# Patient Record
Sex: Female | Born: 1940
Health system: Southern US, Community
[De-identification: ages and names within clinical notes are randomized; demographics above are authoritative.]

## PROBLEM LIST (undated history)

## (undated) ENCOUNTER — Emergency Department (HOSPITAL_COMMUNITY): Admission: EM | Discharge status: Against Medical Advice | Payer: Medicare Other

## (undated) DIAGNOSIS — Z5189 Encounter for other specified aftercare: Secondary | ICD-10-CM

## (undated) DIAGNOSIS — D649 Anemia, unspecified: Secondary | ICD-10-CM

## (undated) DIAGNOSIS — E039 Hypothyroidism, unspecified: Secondary | ICD-10-CM

## (undated) DIAGNOSIS — F419 Anxiety disorder, unspecified: Secondary | ICD-10-CM

## (undated) DIAGNOSIS — IMO0001 Reserved for inherently not codable concepts without codable children: Secondary | ICD-10-CM

## (undated) DIAGNOSIS — M1990 Unspecified osteoarthritis, unspecified site: Secondary | ICD-10-CM

## (undated) DIAGNOSIS — E785 Hyperlipidemia, unspecified: Secondary | ICD-10-CM

## (undated) DIAGNOSIS — C801 Malignant (primary) neoplasm, unspecified: Secondary | ICD-10-CM

## (undated) DIAGNOSIS — I10 Essential (primary) hypertension: Secondary | ICD-10-CM

## (undated) HISTORY — PX: TUBAL LIGATION: SHX77

## (undated) HISTORY — DX: Unspecified osteoarthritis, unspecified site: M19.90

## (undated) HISTORY — DX: Encounter for other specified aftercare: Z51.89

## (undated) HISTORY — PX: OTHER SURGICAL HISTORY: SHX169

## (undated) HISTORY — DX: Hyperlipidemia, unspecified: E78.5

## (undated) HISTORY — DX: Reserved for inherently not codable concepts without codable children: IMO0001

---

## 2007-07-01 ENCOUNTER — Other Ambulatory Visit: Admission: RE | Admit: 2007-07-01 | Discharge: 2007-07-01 | Payer: Self-pay | Admitting: Obstetrics and Gynecology

## 2007-08-22 ENCOUNTER — Emergency Department (HOSPITAL_COMMUNITY): Admission: EM | Admit: 2007-08-22 | Discharge: 2007-08-22 | Payer: Self-pay | Admitting: Family Medicine

## 2007-09-09 ENCOUNTER — Encounter: Admission: RE | Admit: 2007-09-09 | Discharge: 2007-09-09 | Payer: Self-pay | Admitting: Obstetrics and Gynecology

## 2008-09-09 ENCOUNTER — Encounter: Admission: RE | Admit: 2008-09-09 | Discharge: 2008-09-09 | Payer: Self-pay | Admitting: Obstetrics and Gynecology

## 2009-09-20 ENCOUNTER — Other Ambulatory Visit: Admission: RE | Admit: 2009-09-20 | Discharge: 2009-09-20 | Payer: Self-pay | Admitting: Obstetrics and Gynecology

## 2009-10-12 ENCOUNTER — Encounter: Admission: RE | Admit: 2009-10-12 | Discharge: 2009-10-12 | Payer: Self-pay | Admitting: Obstetrics and Gynecology

## 2010-10-04 ENCOUNTER — Other Ambulatory Visit: Payer: Self-pay | Admitting: Obstetrics and Gynecology

## 2010-10-04 DIAGNOSIS — Z1231 Encounter for screening mammogram for malignant neoplasm of breast: Secondary | ICD-10-CM

## 2010-10-14 ENCOUNTER — Ambulatory Visit
Admission: RE | Admit: 2010-10-14 | Discharge: 2010-10-14 | Disposition: A | Discharge status: Home / Self Care | Payer: Medicare Other | Source: Ambulatory Visit | Attending: Obstetrics and Gynecology | Admitting: Obstetrics and Gynecology

## 2010-10-14 DIAGNOSIS — Z1231 Encounter for screening mammogram for malignant neoplasm of breast: Secondary | ICD-10-CM

## 2011-03-13 LAB — POCT I-STAT, CHEM 8
Creatinine, Ser: 1.1
Glucose, Bld: 128 — ABNORMAL HIGH
Hemoglobin: 14.6
TCO2: 32

## 2011-06-20 HISTORY — PX: COLON SURGERY: SHX602

## 2011-09-20 ENCOUNTER — Other Ambulatory Visit: Payer: Self-pay | Admitting: Obstetrics and Gynecology

## 2011-09-20 DIAGNOSIS — Z1231 Encounter for screening mammogram for malignant neoplasm of breast: Secondary | ICD-10-CM

## 2011-09-28 DIAGNOSIS — E119 Type 2 diabetes mellitus without complications: Secondary | ICD-10-CM | POA: Diagnosis not present

## 2011-09-28 DIAGNOSIS — H52229 Regular astigmatism, unspecified eye: Secondary | ICD-10-CM | POA: Diagnosis not present

## 2011-09-28 DIAGNOSIS — H524 Presbyopia: Secondary | ICD-10-CM | POA: Diagnosis not present

## 2011-09-28 DIAGNOSIS — H5200 Hypermetropia, unspecified eye: Secondary | ICD-10-CM | POA: Diagnosis not present

## 2011-10-17 ENCOUNTER — Ambulatory Visit
Admission: RE | Admit: 2011-10-17 | Discharge: 2011-10-17 | Disposition: A | Discharge status: Home / Self Care | Payer: Medicare Other | Source: Ambulatory Visit | Attending: Obstetrics and Gynecology | Admitting: Obstetrics and Gynecology

## 2011-10-17 DIAGNOSIS — Z1231 Encounter for screening mammogram for malignant neoplasm of breast: Secondary | ICD-10-CM | POA: Diagnosis not present

## 2012-01-05 ENCOUNTER — Other Ambulatory Visit (HOSPITAL_COMMUNITY)
Admission: RE | Admit: 2012-01-05 | Discharge: 2012-01-05 | Disposition: A | Discharge status: Home / Self Care | Payer: Medicare Other | Source: Ambulatory Visit | Attending: Obstetrics and Gynecology | Admitting: Obstetrics and Gynecology

## 2012-01-05 DIAGNOSIS — N83209 Unspecified ovarian cyst, unspecified side: Secondary | ICD-10-CM | POA: Diagnosis not present

## 2012-01-05 DIAGNOSIS — Z124 Encounter for screening for malignant neoplasm of cervix: Secondary | ICD-10-CM | POA: Insufficient documentation

## 2012-01-05 DIAGNOSIS — Z01419 Encounter for gynecological examination (general) (routine) without abnormal findings: Secondary | ICD-10-CM | POA: Diagnosis not present

## 2012-01-23 DIAGNOSIS — N83209 Unspecified ovarian cyst, unspecified side: Secondary | ICD-10-CM | POA: Diagnosis not present

## 2012-02-27 DIAGNOSIS — E7800 Pure hypercholesterolemia, unspecified: Secondary | ICD-10-CM | POA: Diagnosis not present

## 2012-02-27 DIAGNOSIS — E119 Type 2 diabetes mellitus without complications: Secondary | ICD-10-CM | POA: Diagnosis not present

## 2012-02-27 DIAGNOSIS — R42 Dizziness and giddiness: Secondary | ICD-10-CM | POA: Diagnosis not present

## 2012-02-27 DIAGNOSIS — R5383 Other fatigue: Secondary | ICD-10-CM | POA: Diagnosis not present

## 2012-02-27 DIAGNOSIS — E039 Hypothyroidism, unspecified: Secondary | ICD-10-CM | POA: Diagnosis not present

## 2012-02-27 DIAGNOSIS — I10 Essential (primary) hypertension: Secondary | ICD-10-CM | POA: Diagnosis not present

## 2012-02-27 DIAGNOSIS — J309 Allergic rhinitis, unspecified: Secondary | ICD-10-CM | POA: Diagnosis not present

## 2012-02-27 DIAGNOSIS — E538 Deficiency of other specified B group vitamins: Secondary | ICD-10-CM | POA: Diagnosis not present

## 2012-02-27 DIAGNOSIS — Z23 Encounter for immunization: Secondary | ICD-10-CM | POA: Diagnosis not present

## 2012-03-01 DIAGNOSIS — D649 Anemia, unspecified: Secondary | ICD-10-CM | POA: Diagnosis not present

## 2012-03-04 ENCOUNTER — Other Ambulatory Visit: Payer: Self-pay | Admitting: Gastroenterology

## 2012-03-04 DIAGNOSIS — D509 Iron deficiency anemia, unspecified: Secondary | ICD-10-CM | POA: Diagnosis not present

## 2012-03-04 DIAGNOSIS — D649 Anemia, unspecified: Secondary | ICD-10-CM | POA: Diagnosis not present

## 2012-03-04 DIAGNOSIS — C189 Malignant neoplasm of colon, unspecified: Secondary | ICD-10-CM

## 2012-03-04 DIAGNOSIS — K319 Disease of stomach and duodenum, unspecified: Secondary | ICD-10-CM | POA: Diagnosis not present

## 2012-03-04 DIAGNOSIS — K259 Gastric ulcer, unspecified as acute or chronic, without hemorrhage or perforation: Secondary | ICD-10-CM | POA: Diagnosis not present

## 2012-03-04 DIAGNOSIS — C184 Malignant neoplasm of transverse colon: Secondary | ICD-10-CM | POA: Diagnosis not present

## 2012-03-04 DIAGNOSIS — D131 Benign neoplasm of stomach: Secondary | ICD-10-CM | POA: Diagnosis not present

## 2012-03-05 ENCOUNTER — Ambulatory Visit
Admission: RE | Admit: 2012-03-05 | Discharge: 2012-03-05 | Disposition: A | Discharge status: Home / Self Care | Payer: Medicare Other | Source: Ambulatory Visit | Attending: Gastroenterology | Admitting: Gastroenterology

## 2012-03-05 ENCOUNTER — Encounter (HOSPITAL_COMMUNITY): Payer: Self-pay | Admitting: *Deleted

## 2012-03-05 DIAGNOSIS — E669 Obesity, unspecified: Secondary | ICD-10-CM | POA: Insufficient documentation

## 2012-03-05 DIAGNOSIS — D62 Acute posthemorrhagic anemia: Principal | ICD-10-CM | POA: Insufficient documentation

## 2012-03-05 DIAGNOSIS — C182 Malignant neoplasm of ascending colon: Secondary | ICD-10-CM | POA: Diagnosis not present

## 2012-03-05 DIAGNOSIS — E119 Type 2 diabetes mellitus without complications: Secondary | ICD-10-CM | POA: Insufficient documentation

## 2012-03-05 DIAGNOSIS — C189 Malignant neoplasm of colon, unspecified: Secondary | ICD-10-CM

## 2012-03-05 DIAGNOSIS — D500 Iron deficiency anemia secondary to blood loss (chronic): Secondary | ICD-10-CM | POA: Diagnosis not present

## 2012-03-05 DIAGNOSIS — I10 Essential (primary) hypertension: Secondary | ICD-10-CM | POA: Insufficient documentation

## 2012-03-05 DIAGNOSIS — K432 Incisional hernia without obstruction or gangrene: Secondary | ICD-10-CM | POA: Insufficient documentation

## 2012-03-05 DIAGNOSIS — D649 Anemia, unspecified: Secondary | ICD-10-CM | POA: Diagnosis not present

## 2012-03-05 LAB — COMPREHENSIVE METABOLIC PANEL
ALT: 11 U/L (ref 0–35)
AST: 17 U/L (ref 0–37)
Creatinine, Ser: 0.65 mg/dL (ref 0.50–1.10)
GFR calc Af Amer: >90 mL/min (ref >90)
Glucose, Bld: 190 mg/dL — ABNORMAL HIGH (ref 70–99)
Potassium: 3.6 mEq/L (ref 3.5–5.1)
Sodium: 132 mEq/L — ABNORMAL LOW (ref 135–145)
Total Bilirubin: 0.3 mg/dL (ref 0.3–1.2)

## 2012-03-05 LAB — CBC WITH DIFFERENTIAL/PLATELET
Basophils Absolute: 0.1 10*3/uL (ref 0.0–0.1)
Hemoglobin: 6.4 g/dL — CL (ref 12.0–15.0)
Lymphs Abs: 2.7 10*3/uL (ref 0.7–4.0)
MCV: 63.4 fL — ABNORMAL LOW (ref 78.0–100.0)
Monocytes Absolute: 0.7 10*3/uL (ref 0.1–1.0)
Neutrophils Relative %: 53 % (ref 43–77)
RBC: 3.72 MIL/uL — ABNORMAL LOW (ref 3.87–5.11)
WBC: 7.4 10*3/uL (ref 4.0–10.5)

## 2012-03-05 MED ORDER — IOHEXOL 300 MG/ML  SOLN
125.0000 mL | Freq: Once | INTRAMUSCULAR | Status: AC | PRN
  Administered 2012-03-05: 125 mL via INTRAVENOUS

## 2012-03-05 NOTE — ED Notes (Addendum)
Just spoke with Dr Madilyn Fireman nurse and reports that pts hemoglobin was 5.8 in the office yesterday, asked office to fax pts records to place in chart. Nurse reports that they tried to get pt and outpatient appt for blood transfusion but appt was not available till Thursday and pt was sent here for possible admission, requests that pt have surgery consult.

## 2012-03-05 NOTE — ED Notes (Signed)
Pt reports approx 3 month hx of weakness. States that she is followed closely by PCP and reports that they have noted H&H to be low. Pt had colonoscopy and endoscopy done yesterday along with a CT today to rule out reason for H&H. States that she thinks that hemoglogin went from 6 to 4. Pt reports mild sob when going up stairs. Pt states she appt with surgeon on Friday to speak about what was found on colon, pt states she was unsure what it was.

## 2012-03-06 ENCOUNTER — Observation Stay (HOSPITAL_COMMUNITY)
Admission: EM | Admit: 2012-03-06 | Discharge: 2012-03-06 | Disposition: A | Discharge status: Home / Self Care | Payer: Medicare Other | Attending: Internal Medicine | Admitting: Internal Medicine

## 2012-03-06 DIAGNOSIS — C189 Malignant neoplasm of colon, unspecified: Secondary | ICD-10-CM | POA: Diagnosis not present

## 2012-03-06 DIAGNOSIS — D649 Anemia, unspecified: Secondary | ICD-10-CM | POA: Diagnosis not present

## 2012-03-06 DIAGNOSIS — K6389 Other specified diseases of intestine: Secondary | ICD-10-CM

## 2012-03-06 DIAGNOSIS — E119 Type 2 diabetes mellitus without complications: Secondary | ICD-10-CM

## 2012-03-06 DIAGNOSIS — I10 Essential (primary) hypertension: Secondary | ICD-10-CM | POA: Diagnosis not present

## 2012-03-06 HISTORY — DX: Essential (primary) hypertension: I10

## 2012-03-06 LAB — CBC
HCT: 32 % — ABNORMAL LOW (ref 36.0–46.0)
Hemoglobin: 9.7 g/dL — ABNORMAL LOW (ref 12.0–15.0)
RDW: 21.7 % — ABNORMAL HIGH (ref 11.5–15.5)
WBC: 8.2 10*3/uL (ref 4.0–10.5)

## 2012-03-06 LAB — GLUCOSE, CAPILLARY
Glucose-Capillary: 118 mg/dL — ABNORMAL HIGH (ref 70–99)
Glucose-Capillary: 158 mg/dL — ABNORMAL HIGH (ref 70–99)

## 2012-03-06 LAB — CEA: CEA: 1 ng/mL (ref 0.0–5.0)

## 2012-03-06 LAB — OCCULT BLOOD, POC DEVICE: Fecal Occult Bld: NEGATIVE

## 2012-03-06 LAB — FOLATE: Folate: >20 ng/mL

## 2012-03-06 LAB — VITAMIN B12: Vitamin B-12: 596 pg/mL (ref 211–911)

## 2012-03-06 LAB — IRON AND TIBC: Iron: <10 ug/dL — ABNORMAL LOW (ref 42–135)

## 2012-03-06 LAB — PREPARE RBC (CROSSMATCH)

## 2012-03-06 LAB — RETICULOCYTES: Retic Ct Pct: 2 % (ref 0.4–3.1)

## 2012-03-06 MED ORDER — LEVOTHYROXINE SODIUM 137 MCG PO TABS
137000.0000 ug | ORAL_TABLET | Freq: Every day | ORAL | Status: DC
  Filled 2012-03-06 (×2): qty 1000

## 2012-03-06 MED ORDER — SIMVASTATIN 20 MG PO TABS
20.0000 mg | ORAL_TABLET | Freq: Every day | ORAL | Status: DC
  Administered 2012-03-06: 20 mg via ORAL
  Filled 2012-03-06: qty 1

## 2012-03-06 MED ORDER — ENALAPRIL MALEATE 10 MG PO TABS
10.0000 mg | ORAL_TABLET | Freq: Every day | ORAL | Status: DC
  Administered 2012-03-06: 10 mg via ORAL
  Filled 2012-03-06: qty 1

## 2012-03-06 MED ORDER — ACETAMINOPHEN 325 MG PO TABS
650.0000 mg | ORAL_TABLET | Freq: Once | ORAL | Status: AC
  Administered 2012-03-06: 650 mg via ORAL
  Filled 2012-03-06: qty 2

## 2012-03-06 MED ORDER — GLIPIZIDE ER 10 MG PO TB24
10.0000 mg | ORAL_TABLET | Freq: Every day | ORAL | Status: DC
  Administered 2012-03-06: 10 mg via ORAL
  Filled 2012-03-06: qty 1

## 2012-03-06 MED ORDER — LEVOTHYROXINE SODIUM 137 MCG PO TABS
137.0000 ug | ORAL_TABLET | ORAL | Status: DC
  Filled 2012-03-06: qty 1

## 2012-03-06 MED ORDER — HYDROCHLOROTHIAZIDE 25 MG PO TABS
25.0000 mg | ORAL_TABLET | Freq: Every day | ORAL | Status: DC
  Administered 2012-03-06: 25 mg via ORAL
  Filled 2012-03-06: qty 1

## 2012-03-06 MED ORDER — AMLODIPINE BESYLATE 10 MG PO TABS
10.0000 mg | ORAL_TABLET | Freq: Every day | ORAL | Status: DC
  Administered 2012-03-06: 10 mg via ORAL
  Filled 2012-03-06: qty 1

## 2012-03-06 MED ORDER — SODIUM CHLORIDE 0.9 % IJ SOLN
3.0000 mL | Freq: Two times a day (BID) | INTRAMUSCULAR | Status: DC
  Administered 2012-03-06: 3 mL via INTRAVENOUS

## 2012-03-06 MED ORDER — HYDROCHLOROTHIAZIDE 25 MG PO TABS
25.0000 mg | ORAL_TABLET | ORAL | Status: DC
  Filled 2012-03-06: qty 1

## 2012-03-06 MED ORDER — ENALAPRIL MALEATE 10 MG PO TABS
10.0000 mg | ORAL_TABLET | ORAL | Status: DC
  Filled 2012-03-06: qty 1

## 2012-03-06 MED ORDER — LEVOTHYROXINE SODIUM 137 MCG PO TABS
137.0000 ug | ORAL_TABLET | Freq: Every day | ORAL | Status: DC
  Administered 2012-03-06: 137 ug via ORAL
  Filled 2012-03-06 (×2): qty 1

## 2012-03-06 MED ORDER — ENALAPRIL-HYDROCHLOROTHIAZIDE 10-25 MG PO TABS: 1.0000 | ORAL_TABLET | Freq: Every day | ORAL | Status: DC

## 2012-03-06 MED ORDER — DIPHENHYDRAMINE HCL 50 MG/ML IJ SOLN
50.0000 mg | Freq: Once | INTRAMUSCULAR | Status: AC
  Administered 2012-03-06: 50 mg via INTRAVENOUS
  Filled 2012-03-06 (×2): qty 1

## 2012-03-06 MED ORDER — AMLODIPINE BESYLATE 10 MG PO TABS
10.0000 mg | ORAL_TABLET | ORAL | Status: DC
  Filled 2012-03-06: qty 1

## 2012-03-06 MED ORDER — SIMVASTATIN 20 MG PO TABS
20.0000 mg | ORAL_TABLET | ORAL | Status: DC
  Filled 2012-03-06: qty 1

## 2012-03-06 MED ORDER — GLIPIZIDE ER 10 MG PO TB24
10.0000 mg | ORAL_TABLET | ORAL | Status: DC
  Filled 2012-03-06: qty 1

## 2012-03-06 NOTE — ED Notes (Signed)
Patient presents with c/o feeling weak and tired for the past 3 months.  Skin pale, mucous membranes moist

## 2012-03-06 NOTE — Discharge Summary (Signed)
Physician Discharge Summary  Judith Stephens JXB:[id number] DOB: Nov 15, 1940 DOA: 03/06/2012  PCP: Benita Stabile, MD  Admit date: 03/06/2012 Discharge date: 03/06/2012  Recommendations for Outpatient Follow-up:  1. Pt will need to follow up with PCP in 2-3 weeks post discharge 2. Please also check CBC to evaluate Hg and Hct levels 3. Please note that pt was transfused total of 3 units PRBC 4. Pt will see Dr. Derrell Lolling 03/08/2012 for further evaluation   Discharge Diagnoses: Acute blood loss anemia likely secondary to newly diagnosed carcinoma of the colon.  Principal Problem:  *Anemia Active Problems:  Colon cancer  Diabetes mellitus  HTN (hypertension)   Discharge Condition: Stable  Diet recommendation: Heart healthy diet discussed in details   History of present illness:  Judith Stephens is a 71 y.o. female who presented to the ED today at the request of her GI doctor (Dr. Madilyn Fireman) for a blood transfusion due to chronic blood loss anemia. The patients chronic blood loss anemia is occuring in the context of a recently identified carcinoma of the colon (seen on colonoscopy, biopsy, and CT scan). The patient is also scheduled to have an initial appointment with surgery. The anemia is chronic in duration. Nothing seems to alleviate the symptoms of fatigue associated with her anemia.  Hospital Course:  Principal Problem:  *Anemia - possibly related to newly diagnosed carcinoma - pt was transfused 3 units of PRBC and Hg at the time of the discharge pending - will call pt in AM with the final result and will also forward to her GI specialist - further evaluation per GI and surgery  Active Problems:  Colon cancer - this will have to be coordinated with PCP and appropriate referrals made a    Diabetes mellitus - continue home medication regimen   HTN (hypertension) - continue home medication regimen  Procedures/Studies: Ct Abdomen Pelvis W Contrast 03/05/2012    IMPRESSION: .  1.   Constricting lesion in the ascending colon just proximal to the the hepatic flexure consistent with the patient's known colon carcinoma.   2.  Minimal strandiness of the adjacent soft tissues is noted, but no adenopathy is seen.  No evidence of hepatic metastasis.  No adenopathy.  3.  1.7 cm indeterminate right renal lesion.  Cannot exclude renal cell carcinoma.  Consider MRI to assess further.   4. Two gallstones, the larger of 2.1 cm.  5.  6 mm nonobstructing right lower pole renal calculus.  6.  Probable left ovarian cyst.  Consider pelvic ultrasound if further assessment if warranted.  7.  Periumbilical hernia containing a nondilated loop of small bowel.     Consultations:  Surgery - Dr. Derrell Lolling  Antibiotics:  none  Discharge Exam: Filed Vitals:   03/06/12 1447  BP: 135/81  Pulse: 72  Temp: 98.5 F (36.9 C)  Resp: 16   Filed Vitals:   03/06/12 1225 03/06/12 1325 03/06/12 1415 03/06/12 1447  BP: 150/77 145/81 151/82 135/81  Pulse: 77 73 75 72  Temp: 98.4 F (36.9 C) 98.9 F (37.2 C) 98.7 F (37.1 C) 98.5 F (36.9 C)  TempSrc: Oral Oral Oral Oral  Resp: 16   16  SpO2:        General: Pt is alert, follows commands appropriately, not in acute distress Cardiovascular: Regular rate and rhythm, S1/S2 +, no murmurs, no rubs, no gallops Respiratory: Clear to auscultation bilaterally, no wheezing, no crackles, no rhonchi Abdominal: Soft, non tender, non distended, bowel sounds +, no guarding Extremities: no edema,  no cyanosis, pulses palpable bilaterally DP and PT Neuro: Grossly nonfocal  Discharge Instructions  Discharge Orders    Future Appointments: Provider: Department: Dept Phone: Center:   03/08/2012 11:00 AM Ernestene Mention, MD [facility] [phone] None     Future Orders Please Complete By Expires   Diet - low sodium heart healthy      Increase activity slowly          Medication List     As of 03/06/2012  3:06 PM    TAKE these medications           amLODipine 10 MG tablet   Commonly known as: NORVASC   Take 10 mg by mouth daily.      enalapril-hydrochlorothiazide 10-25 MG per tablet   Commonly known as: VASERETIC   Take 1 tablet by mouth daily.      GLIPIZIDE XL 10 MG 24 hr tablet   Generic drug: glipiZIDE   Take 10 mg by mouth daily.      levothyroxine 137 MCG tablet   Commonly known as: SYNTHROID, LEVOTHROID   Take 137 mg by mouth daily.      pravastatin 40 MG tablet   Commonly known as: PRAVACHOL   Take 40 mg by mouth daily.           Follow-up Information    Follow up with Ernestene Mention, MD. On 03/08/2012.   Contact information:   [address] [place] [address] [phone]       Follow up with Benita Stabile, MD. In 2 weeks.   Contact information:   [facility] [address] [place] [address] [phone]           The results of significant diagnostics from this hospitalization (including imaging, microbiology, ancillary and laboratory) are listed below for reference.     Microbiology: No results found for this or any previous visit (from the past 240 hour(s)).   Labs: Basic Metabolic Panel:  Lab 03/05/12 3295  NA 132*  K 3.6  CL 97  CO2 24  GLUCOSE 190*  BUN 10  CREATININE 0.65  CALCIUM 10.3  MG --  PHOS --  Liver Function Tests:  Lab 03/05/12 1725  AST 17  ALT 11  ALKPHOS 56  BILITOT 0.3  PROT 8.1  ALBUMIN 3.8   CBC:  Lab 03/05/12 1725  WBC 7.4  NEUTROABS 3.9  HGB 6.4*  HCT 23.6*  MCV 63.4*  PLT 317   CBG:  Lab 03/06/12 1141 03/06/12 0854 03/06/12 0223  GLUCAP 182* 126* 118*     SIGNED: Time coordinating discharge: Over 30 minutes  Debbora Presto, MD  [facility] 03/06/2012, 3:06 PM Pager [phone]  If 7PM-7AM, please contact night-coverage [email or web address] Password [email or web address]

## 2012-03-06 NOTE — Progress Notes (Signed)
Utilization review completed.  

## 2012-03-06 NOTE — ED Provider Notes (Signed)
History     CSN: [id number]  Arrival date & time 03/05/12  1705   First MD Initiated Contact with Patient 03/06/12 0019      Chief Complaint  Patient presents with  . Anemia    (Consider location/radiation/quality/duration/timing/severity/associated sxs/prior treatment) HPI Comments: Pt presents for evaluation of anemia at the request of her physician.  She had a colonoscopy and endoscopy performed yesterday and was noted to have a low hemoglobin (5.90.  She reports having some fatigue and having a near syncopal event while at the hairdresser about a week ago.  She states that she feels winded after walking a small flight of stairs.  She denies any chest pain, palpitations, GI bleeding, melena, hematemesis, vaginal bleeding.  She denies any hx of CAD.  Patient is a 71 y.o. female presenting with anemia. The history is provided by the patient. No language interpreter was used.  Anemia This is a new problem. The current episode started more than 1 week ago (unk). The problem has not changed since onset.Associated symptoms include shortness of breath. Pertinent negatives include no chest pain, no abdominal pain and no headaches. The symptoms are aggravated by walking.    Past Medical History  Diagnosis Date  . Hypertension   . Diabetes mellitus     History reviewed. No pertinent past surgical history.  History reviewed. No pertinent family history.  History  Substance Use Topics  . Smoking status: Never Smoker   . Smokeless tobacco: Not on file  . Alcohol Use: No    OB History    Grav Para Term Preterm Abortions TAB SAB Ect Mult Living                  Review of Systems  Constitutional: Positive for fatigue. Negative for fever, chills, diaphoresis, activity change and appetite change.  HENT: Negative for congestion, sore throat, facial swelling, rhinorrhea, neck pain and neck stiffness.   Eyes: Negative for visual disturbance.  Respiratory: Positive for shortness of  breath. Negative for cough, choking, chest tightness and wheezing.   Cardiovascular: Negative for chest pain, palpitations and leg swelling.  Gastrointestinal: Negative for nausea, vomiting, abdominal pain, diarrhea, constipation, blood in stool and anal bleeding.  Genitourinary: Negative for dysuria, urgency, hematuria, vaginal bleeding, difficulty urinating and dyspareunia.  Musculoskeletal: Negative for back pain and joint swelling.  Skin: Negative for color change, pallor and wound.  Neurological: Positive for light-headedness. Negative for dizziness, syncope, speech difficulty, weakness and headaches.  Hematological: Does not bruise/bleed easily.  Psychiatric/Behavioral: Negative.     Allergies  Review of patient's allergies indicates no known allergies.  Home Medications   Current Outpatient Rx  Name Route Sig Dispense Refill  . AMLODIPINE BESYLATE 10 MG PO TABS Oral Take 10 mg by mouth daily.    . ENALAPRIL-HYDROCHLOROTHIAZIDE 10-25 MG PO TABS Oral Take 1 tablet by mouth daily.    Marland Kitchen GLIPIZIDE XL 10 MG PO TB24 Oral Take 10 mg by mouth daily.    Marland Kitchen LEVOTHYROXINE SODIUM 137 MCG PO TABS Oral Take 137 mg by mouth daily.    Marland Kitchen PRAVASTATIN SODIUM 40 MG PO TABS Oral Take 40 mg by mouth daily.      BP 174/87  Pulse 81  Temp 98.2 F (36.8 C) (Oral)  Resp 16  SpO2 100%  Physical Exam  Nursing note and vitals reviewed. Constitutional: She is oriented to person, place, and time. She appears well-developed and well-nourished. No distress.  HENT:  Head: Normocephalic and atraumatic.  Right  Ear: External ear normal.  Left Ear: External ear normal.  Nose: Nose normal.  Mouth/Throat: Oropharynx is clear and moist. No oropharyngeal exudate.  Eyes: Conjunctivae normal and EOM are normal. Pupils are equal, round, and reactive to light. Right eye exhibits no discharge. Left eye exhibits no discharge. No scleral icterus.  Neck: Normal range of motion. Neck supple. No JVD present. No tracheal  deviation present. No thyromegaly present.  Cardiovascular: Normal rate, regular rhythm, normal heart sounds and intact distal pulses.  Exam reveals no gallop and no friction rub.   No murmur heard. Pulmonary/Chest: Effort normal and breath sounds normal. No stridor. No respiratory distress. She has no wheezes. She has no rales. She exhibits no tenderness.  Abdominal: Soft. Bowel sounds are normal. She exhibits no distension and no mass. There is no tenderness. There is no rebound and no guarding.  Genitourinary: Rectal exam shows external hemorrhoid and internal hemorrhoid. Rectal exam shows no fissure, no mass, no tenderness and anal tone normal. Guaiac negative stool.  Musculoskeletal: Normal range of motion. She exhibits no edema and no tenderness.  Lymphadenopathy:    She has no cervical adenopathy.  Neurological: She is alert and oriented to person, place, and time. No cranial nerve deficit.  Skin: Skin is warm and dry. No rash noted. She is not diaphoretic. No erythema. No pallor.  Psychiatric: She has a normal mood and affect. Her behavior is normal.    ED Course  Procedures (including critical care time)  Labs Reviewed  COMPREHENSIVE METABOLIC PANEL - Abnormal; Notable for the following:    Sodium 132 (*)     Glucose, Bld 190 (*)     GFR calc non Af Amer 88 (*)     All other components within normal limits  CBC WITH DIFFERENTIAL - Abnormal; Notable for the following:    RBC 3.72 (*)     Hemoglobin 6.4 (*)     HCT 23.6 (*)     MCV 63.4 (*)     MCH 17.2 (*)     MCHC 27.1 (*)     RDW 18.4 (*)     All other components within normal limits  TYPE AND SCREEN  ABO/RH   Ct Abdomen Pelvis W Contrast  03/05/2012  *RADIOLOGY REPORT*  Clinical Data: New diagnosis of colon carcinoma, anemia, fatigue  CT ABDOMEN AND PELVIS WITH CONTRAST  Technique:  Multidetector CT imaging of the abdomen and pelvis was performed following the standard protocol during bolus administration of intravenous  contrast.  Contrast: OMNIPAQUE IOHEXOL 300 MG/ML  SOLN  Comparison: None.  Findings: The lung bases are clear.  The liver enhances and there is no evidence of metastatic involvement of the liver.  No ductal dilatation is seen.  There do appear to be two gallstones within the contracted gallbladder with the larger measuring 2.1 cm.  The pancreas is normal in size and the pancreatic duct is not dilated. The adrenal glands and spleen are unremarkable.  The stomach is moderately fluid distended with no abnormality noted.  The kidneys enhance and there is a cyst emanating from upper pole of the right kidney measuring 3.6 cm.  A cyst emanates from the lower pole left kidney as well.  There is an indeterminate lesion emanating from the right mid lower kidney posterolaterally with attenuation of 34 HU measuring 1.8 cm.  Although this may represent a complex cyst, a small renal cell carcinoma cannot be excluded.  A 6 mm nonobstructing right lower pole renal calculus  is present.  The abdominal aorta is normal in caliber.  The mesenteric vasculature appears patent.  No adenopathy is seen.  The urinary bladder is not well distended.  There are low attenuation structures in the left adnexa with adjacent calcification probably representing left ovarian cyst possibly septated.  If further assessment is warranted ultrasound would be recommended.  There is a constricting lesion within the ascending colon just below the hepatic flexure consistent with the patient's recently diagnosed colon carcinoma.  Some strandiness of the adjacent soft tissues is seen but no adenopathy is noted.  There is a periumbilical hernia containing a nondilated loop of small bowel. No pelvic adenopathy is seen.  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 1.  Constricting lesion in the ascending colon just proximal to the the hepatic flexure consistent with the patient's known colon carcinoma.  Minimal strandiness of the  adjacent soft tissues is noted, but no adenopathy is seen.  No evidence of hepatic metastasis.  No adenopathy. 3.  1.7 cm indeterminate right renal lesion.  Cannot exclude renal cell carcinoma.  Consider MRI to assess further.  4. Two gallstones, the larger of 2.1 cm. 5.  6 mm nonobstructing right lower pole renal calculus. 6.  Probable left ovarian cyst.  Consider pelvic ultrasound if further assessment if warranted. 7.  Periumbilical hernia containing a nondilated loop of small bowel.   Original Report Authenticated By: Juline Patch, M.D.      No diagnosis found.   Date: 03/06/2012  Rate: 80 bpm  Rhythm: sinus  QRS Axis: left  Intervals: normal  ST/T Wave abnormalities: normal  Conduction Disutrbances:none  Narrative Interpretation: unremarkable study  Old EKG Reviewed: none available      MDM  Pt presents at the request of her gastroenterologist after having an endoscopy performed yesterday.  She was noted to have a low hemoglobin (5.8).  She reports some mild exertional dyspnea but no pain.  She denies any known source of bleeding.  H&H tonight is 6.4/23.6 with a nl platelet count.  Will obtain orthostatic VS and check stool for occult blood.  She has no hx of CAD.  Anticipate initiating an anemia work-up + admission for transfusion.  IMPRESSION:  .  1. Constricting lesion in the ascending colon just proximal to the  the hepatic flexure consistent with the patient's known colon  carcinoma. Minimal strandiness of the adjacent soft tissues is  noted, but no adenopathy is seen.  No evidence of hepatic metastasis. No adenopathy.  3. 1.7 cm indeterminate right renal lesion. Cannot exclude renal  cell carcinoma. Consider MRI to assess further.  4. Two gallstones, the larger of 2.1 cm.  5. 6 mm nonobstructing right lower pole renal calculus.  6. Probable left ovarian cyst. Consider pelvic ultrasound if  further assessment if warranted.  7. Periumbilical hernia containing a  nondilated loop of small  bowel.   CT scan performed on 9/17.  Plan evaluation by hospitalist, pt has outpt f/u appt with a surgeon scheduled for 9/20.  She has no evidence on exam of peritonitis.  Tobin Chad, MD 03/06/12 [phone]

## 2012-03-06 NOTE — Progress Notes (Signed)
General surgery attending note: (see Dr. Arita Miss ED consult)   History: This 71 year old African American female has developed dizziness recently, saw Dr. Lupe Carney, was found to be anemic and was referred to Dr. Dorena Cookey. Hemoglobin is low, possibly as low as 5.8. Recent upper endoscopy shows some benign-appearing gastric polyps. Recent colonoscopy showed a partially obstructing mass at the hepatic flexure which was biopsied and inked., said to be 6 cm in length, and biopsies are pending. He was not able to traverse the mass and the right colon was not visualized.  She was referred to me and has an appointment to see me in my office this Friday.  She was referred to the emergency room because of the dizziness and anemia,  and found to have a hemoglobin of 6.4. She has been a little bit dizzy but otherwise has been functional. She's been tolerating her diet. She's been having normal brown stool denies seeing blood in her stools. No bowel pain, cramps, or nausea and vomiting. No weight loss. She says she is slightly short of breath when she climbs stairs but otherwise not. She was admitted to the hospital for transfusion but is hopeful of going home this afternoon.  A CT scan was done yesterday. This shows a small but constricting lesion near the hepatic flexure. She does have 2 gallstones. There is no evidence of any liver metastasis or adenopathy. There is a small incisional hernia near the umbilicus with a loop of small bowel that does not appear obstructed. There is a 1.2 cm right renal lesion of uncertain significance. I discussed all of these colonoscopy findings and CT findings with the patient.  Exam: Patient alert, in no distress. Mental status normal Neck no adenopathy no mass no jugular venous distention Lungs: Clear auscultation bilaterally Heart: Regular rate and rhythm. No murmur. No ectopy Abdomen: Soft. Nontender. No palpable mass. Midline scar beginning above the umbilicus  and extending to the suprapubic area.  Small, nontender,  and reducible hernia around the umbilicus. Transverse incision suprapubic area from tubal ligation. Extremities: FROM   without edema or deformity Neurologic: Alert, oriented times. 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   She will see me in the office this Friday, 48 hours from now CEA ordered. We will go ahead with tentative scheduling  for a laparoscopic-assisted right colectomy and ventral hernia repair, maybe as early as next week. I did discuss whether or not to do cholecystectomy and she is still contemplating that. That is optional. I discussed the indications, details, techniques, and numerous risks of the surgery with her. Her questions are answered. She understands these issues. She agrees with this plan.    Angelia Mould. Derrell Lolling, M.D., [facility] General and Minimally invasive Surgery Breast and Colorectal Surgery Office:   [phone] Pager:   [phone]

## 2012-03-06 NOTE — H&P (Signed)
[facility] History and Physical  Judith Stephens ZOX:[id number] DOB: March 10, 1941 DOA: 03/06/2012  Referring physician: ED PCP: Benita Stabile, MD   Chief Complaint: Fatigue  HPI: Judith Stephens is a 71 y.o. female who presented to the ED today at the request of her GI doctor (Dr. Madilyn Fireman) for a blood transfusion due to chronic blood loss anemia.  The patients chronic blood loss anemia is occuring in the context of a recently identified carcinoma of the colon (seen on colonoscopy, biopsy, and CT scan).  The patient is also scheduled to have an initial appointment with surgery, the severity of the anemia is a hemoglobin of 5.8.  The anemia is chronic in duration.  Nothing seems to alleviate the symptoms of fatigue associated with her anemia.  Review of Systems: Patient admits to fatigue, denies chest pain, abdominal pain, SOB, 12 systems reviewed and otherwise negative.  Past Medical History  Diagnosis Date  . Hypertension   . Diabetes mellitus    History reviewed. No pertinent past surgical history. Social History:  reports that she has never smoked. She does not have any smokeless tobacco history on file. She reports that she does not drink alcohol. Her drug history not on file. Patient lives at home and performs all ADLs  No Known Allergies  History reviewed. No pertinent family history.  Prior to Admission medications   Medication Sig Stephens Date End Date Taking? Authorizing Provider  amLODipine (NORVASC) 10 MG tablet Take 10 mg by mouth daily. 02/26/12  Yes Historical Provider, MD  enalapril-hydrochlorothiazide (VASERETIC) 10-25 MG per tablet Take 1 tablet by mouth daily. 02/26/12  Yes Historical Provider, MD  GLIPIZIDE XL 10 MG 24 hr tablet Take 10 mg by mouth daily. 02/26/12  Yes Historical Provider, MD  levothyroxine (SYNTHROID, LEVOTHROID) 137 MCG tablet Take 137 mg by mouth daily. 02/26/12  Yes Historical Provider, MD  pravastatin (PRAVACHOL) 40 MG tablet Take 40 mg by mouth daily.  02/26/12  Yes Historical Provider, MD   Physical Exam: Filed Vitals:   03/06/12 0052 03/06/12 0203 03/06/12 0204 03/06/12 0205  BP: 174/87 144/77 133/80 132/77  Pulse: 81 70 86 100  Temp: 98.2 F (36.8 C)     TempSrc: Oral     Resp: 16     SpO2: 100%        General:  NAD, resting comfortably in hospital bed  Eyes: PEERLA EOMI  ENT: moist mucous membranes, dentition intact  Neck: supple w/o JVD  Cardiovascular: RRR w/o MRG  Respiratory: CTA B  Abdomen: soft, nt, nd, bs+, i am unable to appreciate a mass on palpation.  Skin: W/D/I  Neurologic: Grossly non-focal  Labs on Admission:  Basic Metabolic Panel:  Lab 03/05/12 8119  NA 132*  K 3.6  CL 97  CO2 24  GLUCOSE 190*  BUN 10  CREATININE 0.65  CALCIUM 10.3  MG --  PHOS --   Liver Function Tests:  Lab 03/05/12 1725  AST 17  ALT 11  ALKPHOS 56  BILITOT 0.3  PROT 8.1  ALBUMIN 3.8   No results found for this basename: LIPASE:5,AMYLASE:5 in the last 168 hours No results found for this basename: AMMONIA:5 in the last 168 hours CBC:  Lab 03/05/12 1725  WBC 7.4  NEUTROABS 3.9  HGB 6.4*  HCT 23.6*  MCV 63.4*  PLT 317   Cardiac Enzymes: No results found for this basename: CKTOTAL:5,CKMB:5,CKMBINDEX:5,TROPONINI:5 in the last 168 hours  BNP (last 3 results) No results found for this basename: PROBNP:3 in the  last 8760 hours CBG:  Lab 03/06/12 0223  GLUCAP 118*    Radiological Exams on Admission: Ct Abdomen Pelvis W Contrast  03/05/2012  *RADIOLOGY REPORT*  Clinical Data: New diagnosis of colon carcinoma, anemia, fatigue  CT ABDOMEN AND PELVIS WITH CONTRAST  Technique:  Multidetector CT imaging of the abdomen and pelvis was performed following the standard protocol during bolus administration of intravenous contrast.  Contrast: OMNIPAQUE IOHEXOL 300 MG/ML  SOLN  Comparison: None.  Findings: The lung bases are clear.  The liver enhances and there is no evidence of metastatic involvement of the  liver.  No ductal dilatation is seen.  There do appear to be two gallstones within the contracted gallbladder with the larger measuring 2.1 cm.  The pancreas is normal in size and the pancreatic duct is not dilated. The adrenal glands and spleen are unremarkable.  The stomach is moderately fluid distended with no abnormality noted.  The kidneys enhance and there is a cyst emanating from upper pole of the right kidney measuring 3.6 cm.  A cyst emanates from the lower pole left kidney as well.  There is an indeterminate lesion emanating from the right mid lower kidney posterolaterally with attenuation of 34 HU measuring 1.8 cm.  Although this may represent a complex cyst, a small renal cell carcinoma cannot be excluded.  A 6 mm nonobstructing right lower pole renal calculus is present.  The abdominal aorta is normal in caliber.  The mesenteric vasculature appears patent.  No adenopathy is seen.  The urinary bladder is not well distended.  There are low attenuation structures in the left adnexa with adjacent calcification probably representing left ovarian cyst possibly septated.  If further assessment is warranted ultrasound would be recommended.  There is a constricting lesion within the ascending colon just below the hepatic flexure consistent with the patient's recently diagnosed colon carcinoma.  Some strandiness of the adjacent soft tissues is seen but no adenopathy is noted.  There is a periumbilical hernia containing a nondilated loop of small bowel. No pelvic adenopathy is seen.  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 1.  Constricting lesion in the ascending colon just proximal to the the hepatic flexure consistent with the patient's known colon carcinoma.  Minimal strandiness of the adjacent soft tissues is noted, but no adenopathy is seen.  No evidence of hepatic metastasis.  No adenopathy. 3.  1.7 cm indeterminate right renal lesion.  Cannot exclude renal cell carcinoma.   Consider MRI to assess further.  4. Two gallstones, the larger of 2.1 cm. 5.  6 mm nonobstructing right lower pole renal calculus. 6.  Probable left ovarian cyst.  Consider pelvic ultrasound if further assessment if warranted. 7.  Periumbilical hernia containing a nondilated loop of small bowel.   Original Report Authenticated By: Juline Patch, M.D.     EKG: Independently reviewed. Sinus rhythm with questionable U waves.  Assessment/Plan Principal Problem:  *Anemia Active Problems:  Colon cancer  Diabetes mellitus  HTN (hypertension)   1. Anemia - secondary to chronic blood loss from the newly diagnosed carcinoma of the colon, 3 units have already been ordered for transfusion by the ED.  Will admit the patient due to time constraints on ED treatment and perform transfusion under observation. 2. Colon Cancer - the patient has an appointment with surgery on Friday for evaluation, obviously resection is likely in the works for this patient, may or may not also need neoadjuvant chemotherapy as well  3. DM2 - plan to continue home meds in hospital 4. HTN - plan to continue home meds in hospital  Code Status: Full Code Family Communication: No family at bedside at this time Disposition Plan: Admit to observation  Time spent: 45 min  GARDNER, JARED M. [facility] Pager [phone]  If 7PM-7AM, please contact night-coverage [email or web address] Password [facility] 03/06/2012, 5:57 AM

## 2012-03-06 NOTE — Consult Note (Addendum)
Reason for Consult:Ascending colon cancer  Referring Physician: Dr. Lorenso Courier in ED  Judith Stephens is an 71 y.o. female.  HPI:  Judith Stephens is a 71 year old female who has had dizziness and lightheadedness for several weeks.  She was seen by her PCP and diagnosed with anemia.  Her stools were heme positive at the time.  She underwent colonoscopy 2 days ago by Dr. Madilyn Fireman and was found to have fungating mass in colon.  The colonoscopy report and pathology report are not available at this time.  She was scheduled for a CT yesterday and was seen to have a lesion in the ascending colon.  She was instructed to come to the ED by Dr. Madilyn Fireman. She denies nausea/vomiting/constipation/diarrhea.  She has not had large melanotic stools or BRBPR.  She has been slightly lightheaded with SOB on exertion.  She has not noted smaller caliber stools.  Her last colonoscopy was 10 years ago and she had "benign polyps."    Past Medical History  Diagnosis Date  . Hypertension   . Diabetes mellitus     History reviewed. No pertinent past surgical history.  History reviewed. No pertinent family history.  Social History:  reports that she has never smoked. She does not have any smokeless tobacco history on file. She reports that she does not drink alcohol. Her drug history not on file.  Allergies: No Known Allergies  Medications:  Medications (Discharged within 1 Day(s))Long-Term  Prescriptions Show Facility-Administered Medications    amLODipine (NORVASC) 10 MG tablet   enalapril-hydrochlorothiazide (VASERETIC) 10-25 MG per tablet   GLIPIZIDE XL 10 MG 24 hr tablet   levothyroxine (SYNTHROID, LEVOTHROID) 137 MCG tablet   pravastatin (PRAVACHOL) 40 MG tablet     Results for orders placed during the hospital encounter of 03/06/12 (from the past 48 hour(s))  COMPREHENSIVE METABOLIC PANEL     Status: Abnormal   Collection Time   03/05/12  5:25 PM      Component Value Range Comment   Sodium 132 (*) 135 - 145 mEq/L    Potassium  3.6  3.5 - 5.1 mEq/L    Chloride 97  96 - 112 mEq/L    CO2 24  19 - 32 mEq/L    Glucose, Bld 190 (*) 70 - 99 mg/dL    BUN 10  6 - 23 mg/dL    Creatinine, Ser 8.46  0.50 - 1.10 mg/dL    Calcium 96.2  8.4 - 10.5 mg/dL    Total Protein 8.1  6.0 - 8.3 g/dL    Albumin 3.8  3.5 - 5.2 g/dL    AST 17  0 - 37 U/L    ALT 11  0 - 35 U/L    Alkaline Phosphatase 56  39 - 117 U/L    Total Bilirubin 0.3  0.3 - 1.2 mg/dL    GFR calc non Af Amer 88 (*) >90 mL/min    GFR calc Af Amer >90  >90 mL/min   CBC WITH DIFFERENTIAL     Status: Abnormal   Collection Time   03/05/12  5:25 PM      Component Value Range Comment   WBC 7.4  4.0 - 10.5 K/uL    RBC 3.72 (*) 3.87 - 5.11 MIL/uL    Hemoglobin 6.4 (*) 12.0 - 15.0 g/dL    HCT 95.2 (*) 84.1 - 46.0 %    MCV 63.4 (*) 78.0 - 100.0 fL    MCH 17.2 (*) 26.0 - 34.0 pg  MCHC 27.1 (*) 30.0 - 36.0 g/dL    RDW 16.1 (*) 09.6 - 15.5 %    Platelets 317  150 - 400 K/uL    Neutrophils Relative 53  43 - 77 %    Lymphocytes Relative 37  12 - 46 %    Monocytes Relative 9  3 - 12 %    Eosinophils Relative 0  0 - 5 %    Basophils Relative 1  0 - 1 %    Neutro Abs 3.9  1.7 - 7.7 K/uL    Lymphs Abs 2.7  0.7 - 4.0 K/uL    Monocytes Absolute 0.7  0.1 - 1.0 K/uL    Eosinophils Absolute 0.0  0.0 - 0.7 K/uL    Basophils Absolute 0.1  0.0 - 0.1 K/uL    RBC Morphology POLYCHROMASIA PRESENT      Smear Review LARGE PLATELETS PRESENT     TYPE AND SCREEN     Status: Normal (Preliminary result)   Collection Time   03/05/12  5:35 PM      Component Value Range Comment   ABO/RH(D) A POS      Antibody Screen NEG      Sample Expiration 03/08/2012      Unit Number [id number]      Blood Component Type RED CELLS,LR      Unit division 00      Status of Unit ALLOCATED      Transfusion Status OK TO TRANSFUSE      Crossmatch Result Compatible      Unit Number [id number]      Blood Component Type RED CELLS,LR      Unit division 00      Status of Unit ALLOCATED       Transfusion Status OK TO TRANSFUSE      Crossmatch Result Compatible      Unit Number [id number]      Blood Component Type RED CELLS,LR      Unit division 00      Status of Unit ALLOCATED      Transfusion Status OK TO TRANSFUSE      Crossmatch Result Compatible     ABO/RH     Status: Normal   Collection Time   03/05/12  5:35 PM      Component Value Range Comment   ABO/RH(D) A POS     GLUCOSE, CAPILLARY     Status: Abnormal   Collection Time   03/06/12  2:23 AM      Component Value Range Comment   Glucose-Capillary 118 (*) 70 - 99 mg/dL   OCCULT BLOOD, POC DEVICE     Status: Normal   Collection Time   03/06/12  2:31 AM      Component Value Range Comment   Fecal Occult Bld NEGATIVE     RETICULOCYTES     Status: Abnormal   Collection Time   03/06/12  2:59 AM      Component Value Range Comment   Retic Ct Pct 2.0  0.4 - 3.1 %    RBC. 3.67 (*) 3.87 - 5.11 MIL/uL    Retic Count, Manual 73.4  19.0 - 186.0 K/uL   PREPARE RBC (CROSSMATCH)     Status: Normal   Collection Time   03/06/12  3:30 AM      Component Value Range Comment   Order Confirmation ORDER PROCESSED BY BLOOD BANK       Ct Abdomen Pelvis W Contrast  03/05/2012  *RADIOLOGY REPORT*  Clinical Data: New diagnosis of colon carcinoma, anemia, fatigue  CT ABDOMEN AND PELVIS WITH CONTRAST  Technique:  Multidetector CT imaging of the abdomen and pelvis was performed following the standard protocol during bolus administration of intravenous contrast.  Contrast: OMNIPAQUE IOHEXOL 300 MG/ML  SOLN  Comparison: None.  Findings: The lung bases are clear.  The liver enhances and there is no evidence of metastatic involvement of the liver.  No ductal dilatation is seen.  There do appear to be two gallstones within the contracted gallbladder with the larger measuring 2.1 cm.  The pancreas is normal in size and the pancreatic duct is not dilated. The adrenal glands and spleen are unremarkable.  The stomach is moderately fluid distended  with no abnormality noted.  The kidneys enhance and there is a cyst emanating from upper pole of the right kidney measuring 3.6 cm.  A cyst emanates from the lower pole left kidney as well.  There is an indeterminate lesion emanating from the right mid lower kidney posterolaterally with attenuation of 34 HU measuring 1.8 cm.  Although this may represent a complex cyst, a small renal cell carcinoma cannot be excluded.  A 6 mm nonobstructing right lower pole renal calculus is present.  The abdominal aorta is normal in caliber.  The mesenteric vasculature appears patent.  No adenopathy is seen.  The urinary bladder is not well distended.  There are low attenuation structures in the left adnexa with adjacent calcification probably representing left ovarian cyst possibly septated.  If further assessment is warranted ultrasound would be recommended.  There is a constricting lesion within the ascending colon just below the hepatic flexure consistent with the patient's recently diagnosed colon carcinoma.  Some strandiness of the adjacent soft tissues is seen but no adenopathy is noted.  There is a periumbilical hernia containing a nondilated loop of small bowel. No pelvic adenopathy is seen.  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 1.  Constricting lesion in the ascending colon just proximal to the the hepatic flexure consistent with the patient's known colon carcinoma.  Minimal strandiness of the adjacent soft tissues is noted, but no adenopathy is seen.  No evidence of hepatic metastasis.  No adenopathy. 3.  1.7 cm indeterminate right renal lesion.  Cannot exclude renal cell carcinoma.  Consider MRI to assess further.  4. Two gallstones, the larger of 2.1 cm. 5.  6 mm nonobstructing right lower pole renal calculus. 6.  Probable left ovarian cyst.  Consider pelvic ultrasound if further assessment if warranted. 7.  Periumbilical hernia containing a nondilated loop of small bowel.   Original  Report Authenticated By: Juline Patch, M.D.     Review of Systems  Constitutional: Positive for malaise/fatigue.  HENT: Negative.   Eyes: Negative.   Respiratory: Positive for shortness of breath (with exertion).   Cardiovascular: Negative.   Gastrointestinal: Positive for blood in stool.  Genitourinary: Negative.   Musculoskeletal: Negative.   Skin: Negative.   Neurological: Negative.   Endo/Heme/Allergies: Negative.   Psychiatric/Behavioral: Negative.    Blood pressure 132/77, pulse 100, temperature 98.2 F (36.8 C), temperature source Oral, resp. rate 16, SpO2 100.00%. Physical Exam  Constitutional: She is oriented to person, place, and time. She appears well-developed and well-nourished. No distress.  HENT:  Head: Normocephalic and atraumatic.  Right Ear: External ear normal.  Left Ear: External ear normal.  Eyes: Conjunctivae normal are normal. Pupils are equal, round, and reactive to light. Right eye exhibits  no discharge. Left eye exhibits no discharge. No scleral icterus.  Neck: Normal range of motion. Neck supple. No tracheal deviation present. No thyromegaly present.  Cardiovascular: Normal rate, regular rhythm, normal heart sounds and intact distal pulses.  Exam reveals no gallop and no friction rub.   No murmur heard. Respiratory: Effort normal and breath sounds normal. No respiratory distress. She has no wheezes. She has no rales. She exhibits no tenderness.  GI: Soft. Bowel sounds are normal. She exhibits no distension and no mass. There is no tenderness. There is no rebound and no guarding.  Musculoskeletal: Normal range of motion. She exhibits no edema and no tenderness.  Lymphadenopathy:    She has no cervical adenopathy.  Neurological: She is alert and oriented to person, place, and time. Coordination normal.  Skin: Skin is warm and dry. No rash noted. She is not diaphoretic. No erythema. No pallor.  Psychiatric: She has a normal mood and affect. Her behavior is  normal. Judgment and thought content normal.    Assessment/Plan: 1.  New diagnosis of ascending colon cancer with bleeding. 2.  Chronic blood loss anemia.    No signs of obstruction or acute blood loss. No evidence of perforation after colonoscopy. Judith Stephens has appt with Dr. Derrell Lolling Friday for surgical consultation.  Will see how quickly he can get her in OR schedule.   If discharged, would keep this appointment. Will defer admission to ED and medicine if they consider her anemia to warrant admission or transfusion. Judith Stephens without need for emergent exploration and resection.   If admitted, surgical team will follow   Jadan Rouillard 03/06/2012, 5:34 AM

## 2012-03-06 NOTE — Progress Notes (Signed)
Pt admitted to the unit. Pt is stable, alert and oriented per baseline. Oriented to room, staff, and call Shiffer. Educated to call for any assistance. Bed in lowest position, call [place] within reach- will continue to monitor. Alphonsus Sias

## 2012-03-07 ENCOUNTER — Encounter (HOSPITAL_COMMUNITY): Payer: Medicare Other

## 2012-03-07 LAB — TYPE AND SCREEN
ABO/RH(D): A POS
Unit division: 0

## 2012-03-08 ENCOUNTER — Other Ambulatory Visit (INDEPENDENT_AMBULATORY_CARE_PROVIDER_SITE_OTHER): Payer: Self-pay | Admitting: General Surgery

## 2012-03-08 ENCOUNTER — Ambulatory Visit (INDEPENDENT_AMBULATORY_CARE_PROVIDER_SITE_OTHER): Payer: Medicare Other | Admitting: General Surgery

## 2012-03-08 ENCOUNTER — Encounter (INDEPENDENT_AMBULATORY_CARE_PROVIDER_SITE_OTHER): Payer: Self-pay | Admitting: General Surgery

## 2012-03-08 VITALS — BP 142/82 | HR 76 | Temp 97.2°F | Resp 20 | Ht 71.0 in | Wt 216.2 lb

## 2012-03-08 DIAGNOSIS — C189 Malignant neoplasm of colon, unspecified: Secondary | ICD-10-CM | POA: Diagnosis not present

## 2012-03-08 DIAGNOSIS — K432 Incisional hernia without obstruction or gangrene: Secondary | ICD-10-CM | POA: Diagnosis not present

## 2012-03-08 MED ORDER — ALVIMOPAN 12 MG PO CAPS: 12.0000 mg | ORAL_CAPSULE | Freq: Two times a day (BID) | ORAL | Status: DC

## 2012-03-08 NOTE — Progress Notes (Signed)
Patient ID: Judith Stephens, female   DOB: December 05, 1940, 71 y.o.   MRN: [id number]  Chief Complaint  Patient presents with  . Pre-op Exam    eval col ca    HPI Judith Stephens is a 71 y.o. female.  She was referred by Dr. 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    This 71 year old African American female has developed dizziness recently, saw Dr. Lupe Carney, was found to be anemic and was referred to Dr. Dorena Cookey. Hemoglobin is low, possibly as low as 5.8, repeat 6.4. Recent upper endoscopy shows some benign-appearing gastric polyps. Recent colonoscopy showed a partially obstructing mass at the hepatic flexure which was biopsied and inked., said to be 6 cm in length, and biopsies show adenocarcinoma. He was not able to traverse the mass and the right colon was not visualized.  CEA 3.2 (normal)   She was referred to the emergency room two days ago  because of the dizziness and anemia, and found to have a hemoglobin of 6.4. She has been a little bit dizzy but otherwise has been functional. She's been tolerating her diet. She's been having normal brown stool denies seeing blood in her stools. No bowel pain, cramps, or nausea and vomiting. No weight loss. She says she is slightly short of breath when she climbs stairs but otherwise not.  She was admitted to the hospital for transfusion, received 3 units of packed cells which raised her hemoglobin to 9.7, and she was discharged that evening. She feels better now..  A CT scan was done.  This shows a small but constricting lesion near the hepatic flexure. She does have 2 gallstones. There is no evidence of any liver metastasis or adenopathy. There is a small incisional hernia near the umbilicus with a loop of small bowel that does not appear obstructed. There is a 1.2 cm right renal lesion of uncertain significance.   I have discussed her care with Dr. Dorena Cookey. She is scheduled for  laparoscopic assisted right colectomy and repair of ventral hernia, possible open next Thursday at [facility] in the afternoon. She is here today with her husband for further discussion.   HPI  Past Medical History  Diagnosis Date  . Hypertension   . Diabetes mellitus   . Hyperlipidemia   . Blood transfusion   . Arthritis     Past Surgical History  Procedure Date  . Oophorectomy     Family History  Problem Relation Age of Onset  . Hypertension Father   . Hypertension Sister     x3  . Hypertension Brother     Social History History  Substance Use Topics  . Smoking status: Never Smoker   . Smokeless tobacco: Not on file  . Alcohol Use: No    No Known Allergies  Current Outpatient Prescriptions  Medication Sig Dispense Refill  . amLODipine (NORVASC) 10 MG tablet Take 10 mg by mouth daily.      . enalapril-hydrochlorothiazide (VASERETIC) 10-25 MG per tablet Take 1 tablet by mouth daily.      Marland Kitchen GLIPIZIDE XL 10 MG 24 hr tablet Take 10 mg by mouth daily.      Marland Kitchen levothyroxine (SYNTHROID, LEVOTHROID) 137 MCG tablet Take 137 mg by mouth daily.      . pravastatin (PRAVACHOL) 40 MG tablet Take 40 mg by mouth daily.        Review of Systems Review of Systems  Constitutional:  Negative for fever, chills and unexpected weight change.  HENT: Negative for hearing loss, congestion, sore throat, trouble swallowing and voice change.   Eyes: Negative for visual disturbance.  Respiratory: Positive for shortness of breath. Negative for cough and wheezing.   Cardiovascular: Negative for chest pain, palpitations and leg swelling.  Gastrointestinal: Negative for nausea, vomiting, abdominal pain, diarrhea, constipation, blood in stool, abdominal distention and anal bleeding.  Genitourinary: Negative for hematuria, vaginal bleeding and difficulty urinating.  Musculoskeletal: Negative for arthralgias.  Skin: Negative for rash and wound.  Neurological: Positive for dizziness. Negative for  seizures, syncope and headaches.  Hematological: Negative for adenopathy. Does not bruise/bleed easily.  Psychiatric/Behavioral: Negative for confusion.    Blood pressure 142/82, pulse 76, temperature 97.2 F (36.2 C), temperature source Temporal, resp. rate 20, height 5\' 11"  (1.803 m), weight 216 lb 3.2 oz (98.068 kg).  Physical Exam Physical Exam  Constitutional: She is oriented to person, place, and time. She appears well-developed and well-nourished. No distress.  HENT:  Head: Normocephalic and atraumatic.  Nose: Nose normal.  Mouth/Throat: No oropharyngeal exudate.  Eyes: Conjunctivae normal and EOM are normal. Pupils are equal, round, and reactive to light. Left eye exhibits no discharge. No scleral icterus.  Neck: Neck supple. No JVD present. No tracheal deviation present. No thyromegaly present.  Cardiovascular: Normal rate, regular rhythm, normal heart sounds and intact distal pulses.   No murmur heard. Pulmonary/Chest: Effort normal and breath sounds normal. No respiratory distress. She has no wheezes. She has no rales. She exhibits no tenderness.  Abdominal: Soft. Bowel sounds are normal. She exhibits no distension and no mass. There is no tenderness. There is no rebound and no guarding.       She has a midline scar beginning above the umbilicus and extending to the suprapubic area from her previous ovarian cystectomy. There is a small, nontender, and reducible hernia around the umbilicus. There is a transverse Pfannenstiel incision from remote tubal ligation.  Musculoskeletal: She exhibits no edema and no tenderness.  Lymphadenopathy:    She has no cervical adenopathy.  Neurological: She is alert and oriented to person, place, and time. She exhibits normal muscle tone. Coordination normal.  Skin: Skin is warm. No rash noted. She is not diaphoretic. No erythema. No pallor.  Psychiatric: She has a normal mood and affect. Her behavior is normal. Judgment and thought content  normal.    Data Reviewed Colonoscopy report, pathology report, CT scan, hospital records and lab tests  Assessment    Adenocarcinoma of hepatic flexure, circumferential, 6 cm length. Symptomatically not stretch of N. Not actively bleeding no evidence of metastatic disease.  Chronic anemia secondary colon cancer, status post 3 units transfusion with good result  Asymptomatic gallstones  Ventral incisional hernia  1.2 cm right renal lesion of uncertain significance  Hypertension  Non-insulin-dependent diabetes mellitus  Mild obesity    Plan    The patient is scheduled for laparoscopic assisted right colectomy and ventral hernia repair, possible open next Thursday. I have discussed indications, details, techniques, and numerous risk of the surgery with the patient and her husband. They understand these issues. Their questions were answered. They agree with this plan.  She'll undergo a bowel prep one day preop  Plan entereg protocol  MRI of kidney preop to further characterize the right renal lesion. I told her that we would not plan any surgery on this at this time but would involve a urologist if it looked more suspicious on MRI  We  talked about her asymptomatic gallstones. She does not want to have her gallbladder removed unless it is absolutely essential. It is not. She is aware that she may or may not develop symptoms in the future.       Angelia Mould. Derrell Lolling, M.D., [facility] General and Minimally invasive Surgery Breast and Colorectal Surgery Office:   [phone] Pager:   [phone]  03/08/2012, 12:00 PM

## 2012-03-08 NOTE — Patient Instructions (Addendum)
You will be scheduled for a laparoscopic assisted right colectomy and repair of ventral hernia, possible open surgery. This will be done next Thursday.  We have decided not to remove your gallbladder since there are no symptoms.  You will undergo a bowel prep the day before surgery.  You  will be scheduled for an MRI of your kidney preop.     Removal of the Colon (Colectomy) Care After AFTER THE PROCEDURE After surgery, you will be taken to the recovery area where a nurse will watch you and check your progress. After the recovery area you will go to your hospital room. Your surgeon will determine when it is alright for you to take fluids and foods. You will be given pain medications to keep you comfortable. HOME CARE INSTRUCTIONS  Once home, an ice pack applied to the operative site may help with discomfort and keep swelling down.   Change dressings as directed.   Only take over-the-counter or prescription medicines for pain, discomfort, or fever as directed by your caregiver.   You may continue normal diet and activities as directed.   There should be no heavy lifting (more than 10 pounds), strenuous activities or contact sports for three weeks, or as directed.   Keep the wound dry and clean. The wound may be washed gently with soap and water. Gently blot or dab dry following cleansing, without rubbing. Do not take baths, use swimming pools, or use hot tubs for ten days, or as instructed by your caregivers.   If you have a colostomy, care for it as you have been shown.  SEEK MEDICAL CARE IF:   There is redness, swelling, or increasing pain in the wound area.   Pus is coming from the wound.   An unexplained oral temperature above 102 F (38.9 C) develops.   You notice a foul smell coming from the wound or dressing.   There is a breaking open of a wound (edges not staying together) after the sutures have been removed.   There is increasing abdominal pain.  SEEK IMMEDIATE  MEDICAL CARE IF:   A rash develops.   There is difficulty breathing, or development of a reaction or side effects to medications given.  Document Released: 01/04/2004 Document Revised: 05/25/2011 Document Reviewed: 07/09/2007 [facility] Patient Information 2012 [place], [place].

## 2012-03-11 ENCOUNTER — Encounter (HOSPITAL_COMMUNITY): Payer: Self-pay | Admitting: Pharmacy Technician

## 2012-03-12 ENCOUNTER — Encounter (INDEPENDENT_AMBULATORY_CARE_PROVIDER_SITE_OTHER): Payer: Self-pay

## 2012-03-12 ENCOUNTER — Ambulatory Visit (HOSPITAL_COMMUNITY)
Admission: RE | Admit: 2012-03-12 | Discharge: 2012-03-12 | Disposition: A | Discharge status: Home / Self Care | Payer: Medicare Other | Source: Ambulatory Visit | Attending: General Surgery | Admitting: General Surgery

## 2012-03-12 ENCOUNTER — Encounter (HOSPITAL_COMMUNITY): Payer: Self-pay

## 2012-03-12 ENCOUNTER — Encounter (HOSPITAL_COMMUNITY)
Admission: RE | Admit: 2012-03-12 | Discharge: 2012-03-12 | Disposition: A | Discharge status: Home / Self Care | Payer: Medicare Other | Source: Ambulatory Visit | Attending: General Surgery | Admitting: General Surgery

## 2012-03-12 DIAGNOSIS — Y836 Removal of other organ (partial) (total) as the cause of abnormal reaction of the patient, or of later complication, without mention of misadventure at the time of the procedure: Secondary | ICD-10-CM | POA: Diagnosis not present

## 2012-03-12 DIAGNOSIS — E039 Hypothyroidism, unspecified: Secondary | ICD-10-CM | POA: Diagnosis present

## 2012-03-12 DIAGNOSIS — C189 Malignant neoplasm of colon, unspecified: Secondary | ICD-10-CM

## 2012-03-12 DIAGNOSIS — N289 Disorder of kidney and ureter, unspecified: Secondary | ICD-10-CM | POA: Insufficient documentation

## 2012-03-12 DIAGNOSIS — M129 Arthropathy, unspecified: Secondary | ICD-10-CM | POA: Diagnosis present

## 2012-03-12 DIAGNOSIS — D630 Anemia in neoplastic disease: Secondary | ICD-10-CM | POA: Diagnosis present

## 2012-03-12 DIAGNOSIS — Z5331 Laparoscopic surgical procedure converted to open procedure: Secondary | ICD-10-CM

## 2012-03-12 DIAGNOSIS — Z6830 Body mass index (BMI) 30.0-30.9, adult: Secondary | ICD-10-CM

## 2012-03-12 DIAGNOSIS — N281 Cyst of kidney, acquired: Secondary | ICD-10-CM | POA: Diagnosis not present

## 2012-03-12 DIAGNOSIS — E119 Type 2 diabetes mellitus without complications: Secondary | ICD-10-CM | POA: Diagnosis present

## 2012-03-12 DIAGNOSIS — Z79899 Other long term (current) drug therapy: Secondary | ICD-10-CM

## 2012-03-12 DIAGNOSIS — E785 Hyperlipidemia, unspecified: Secondary | ICD-10-CM | POA: Diagnosis present

## 2012-03-12 DIAGNOSIS — K432 Incisional hernia without obstruction or gangrene: Secondary | ICD-10-CM | POA: Diagnosis present

## 2012-03-12 DIAGNOSIS — C183 Malignant neoplasm of hepatic flexure: Principal | ICD-10-CM | POA: Diagnosis present

## 2012-03-12 DIAGNOSIS — D131 Benign neoplasm of stomach: Secondary | ICD-10-CM | POA: Diagnosis present

## 2012-03-12 DIAGNOSIS — K560 Paralytic ileus: Secondary | ICD-10-CM | POA: Diagnosis not present

## 2012-03-12 DIAGNOSIS — I10 Essential (primary) hypertension: Secondary | ICD-10-CM | POA: Diagnosis present

## 2012-03-12 DIAGNOSIS — K8020 Calculus of gallbladder without cholecystitis without obstruction: Secondary | ICD-10-CM | POA: Diagnosis present

## 2012-03-12 DIAGNOSIS — K660 Peritoneal adhesions (postprocedural) (postinfection): Secondary | ICD-10-CM | POA: Diagnosis present

## 2012-03-12 DIAGNOSIS — K6389 Other specified diseases of intestine: Secondary | ICD-10-CM | POA: Diagnosis not present

## 2012-03-12 DIAGNOSIS — K929 Disease of digestive system, unspecified: Secondary | ICD-10-CM | POA: Diagnosis not present

## 2012-03-12 DIAGNOSIS — E669 Obesity, unspecified: Secondary | ICD-10-CM | POA: Diagnosis present

## 2012-03-12 DIAGNOSIS — Z9079 Acquired absence of other genital organ(s): Secondary | ICD-10-CM

## 2012-03-12 HISTORY — DX: Anemia, unspecified: D64.9

## 2012-03-12 HISTORY — DX: Hypothyroidism, unspecified: E03.9

## 2012-03-12 LAB — COMPREHENSIVE METABOLIC PANEL
AST: 18 U/L (ref 0–37)
Albumin: 3.8 g/dL (ref 3.5–5.2)
BUN: 21 mg/dL (ref 6–23)
Chloride: 102 mEq/L (ref 96–112)
Creatinine, Ser: 0.75 mg/dL (ref 0.50–1.10)
Total Bilirubin: 0.4 mg/dL (ref 0.3–1.2)
Total Protein: 7.8 g/dL (ref 6.0–8.3)

## 2012-03-12 LAB — SURGICAL PCR SCREEN: Staphylococcus aureus: NEGATIVE

## 2012-03-12 LAB — CBC
HCT: 31.3 % — ABNORMAL LOW (ref 36.0–46.0)
MCHC: 29.4 g/dL — ABNORMAL LOW (ref 30.0–36.0)
MCV: 68.9 fL — ABNORMAL LOW (ref 78.0–100.0)
Platelets: 361 10*3/uL (ref 150–400)
RDW: 23.4 % — ABNORMAL HIGH (ref 11.5–15.5)
WBC: 8.1 10*3/uL (ref 4.0–10.5)

## 2012-03-12 MED ORDER — GADOBENATE DIMEGLUMINE 529 MG/ML IV SOLN
20.0000 mL | Freq: Once | INTRAVENOUS | Status: AC
  Administered 2012-03-12: 20 mL via INTRAVENOUS

## 2012-03-12 NOTE — Patient Instructions (Signed)
20 Shantika Crossno  03/12/2012   Your procedure is scheduled on:  03/14/12 230pm-500pm  Report to [facility] at 1200noon  Call this number if you have problems the morning of surgery: [phone]   Remember:   Do not eat food:After Midnight.  May have clear liquids:until 0800am then npo .  Clear liquids include soda, tea, black coffee, apple or grape juice, broth.  Take these medicines the morning of surgery with A SIP OF WATER:    Do not wear jewelry, make-up or nail polish.  Do not wear lotions, powders, or perfumes. .   Do not bring valuables to the hospital.  Contacts, dentures or bridgework may not be worn into surgery.  Leave suitcase in the car. After surgery it may be brought to your room.  For patients admitted to the hospital, checkout time is 11:00 AM the day of discharge.      Special Instructions: SEE CHG INSTRUCTION SHEET    Please read over the following fact sheets that you were given: MRSA Information, coughing and deep breathing exercises, leg exercises

## 2012-03-12 NOTE — H&P (Signed)
Judith Stephens    MRN: [id number]   Description: 71 year old female  Provider: Ernestene Mention, MD  Department: [facility]       Diagnoses     Colon cancer   - Primary    153.9    Incisional hernia     553.21        Vitals -    BP Pulse Temp Resp Ht Wt    142/82 76 97.2 F (36.2 C) (Temporal) 20 5\' 11"  (1.803 m) 216 lb 3.2 oz (98.068 kg)   BMI - 30.15 kg/m2                 History and Physical     Ernestene Mention, MD   Patient ID: Judith Stephens, female   DOB: [birth year], 63 y.o.   MRN: [id number]                HPI Judith Stephens is a 71 y.o. female.  She was referred by Dr. 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    This 71 year old African American female has developed dizziness recently, saw Dr. Lupe Carney, was found to be anemic and was referred to Dr. Dorena Cookey. Hemoglobin is low, possibly as low as 5.8, repeat 6.4. Recent upper endoscopy shows some benign-appearing gastric polyps. Recent colonoscopy showed a partially obstructing mass at the hepatic flexure which was biopsied and inked., said to be 6 cm in length, and biopsies show adenocarcinoma. He was not able to traverse the mass and the right colon was not visualized.  CEA 3.2 (normal)    She was referred to the emergency room two days ago  because of the dizziness and anemia, and found to have a hemoglobin of 6.4. She has been a little bit dizzy but otherwise has been functional. She's been tolerating her diet. She's been having normal brown stool denies seeing blood in her stools. No bowel pain, cramps, or nausea and vomiting. No weight loss. She says she is slightly short of breath when she climbs stairs but otherwise not.   She was admitted to the hospital for transfusion, received 3 units of packed cells which raised her hemoglobin to 9.7, and she was discharged that evening. She feels better now..   A CT scan was done.  This  shows a small but constricting lesion near the hepatic flexure. She does have 2 gallstones. There is no evidence of any liver metastasis or adenopathy. There is a small incisional hernia near the umbilicus with a loop of small bowel that does not appear obstructed. There is a 1.2 cm right renal lesion of uncertain significance.    I have discussed her care with Dr. Dorena Cookey. She is scheduled for laparoscopic assisted right colectomy and repair of ventral hernia, possible open next Thursday at [facility] in the afternoon. She is here today with her husband for further discussion.         Past Medical History   Diagnosis  Date   .  Hypertension     .  Diabetes mellitus     .  Hyperlipidemia     .  Blood transfusion     .  Arthritis         Past Surgical History   Procedure  Date   .  Oophorectomy         Family History   Problem  Relation  Age of Onset   .  Hypertension  Father     .  Hypertension  Sister         x3   .  Hypertension  Brother        Social History History   Substance Use Topics   .  Smoking status:  Never Smoker    .  Smokeless tobacco:  Not on file   .  Alcohol Use:  No      No Known Allergies    Current Outpatient Prescriptions   Medication  Sig  Dispense  Refill   .  amLODipine (NORVASC) 10 MG tablet  Take 10 mg by mouth daily.         .  enalapril-hydrochlorothiazide (VASERETIC) 10-25 MG per tablet  Take 1 tablet by mouth daily.         Marland Kitchen  GLIPIZIDE XL 10 MG 24 hr tablet  Take 10 mg by mouth daily.         Marland Kitchen  levothyroxine (SYNTHROID, LEVOTHROID) 137 MCG tablet  Take 137 mg by mouth daily.         .  pravastatin (PRAVACHOL) 40 MG tablet  Take 40 mg by mouth daily.            Review of Systems   Constitutional: Negative for fever, chills and unexpected weight change.  HENT: Negative for hearing loss, congestion, sore throat, trouble swallowing and voice change.   Eyes: Negative for visual disturbance.  Respiratory: Positive for shortness  of breath. Negative for cough and wheezing.   Cardiovascular: Negative for chest pain, palpitations and leg swelling.  Gastrointestinal: Negative for nausea, vomiting, abdominal pain, diarrhea, constipation, blood in stool, abdominal distention and anal bleeding.  Genitourinary: Negative for hematuria, vaginal bleeding and difficulty urinating.  Musculoskeletal: Negative for arthralgias.  Skin: Negative for rash and wound.  Neurological: Positive for dizziness. Negative for seizures, syncope and headaches.  Hematological: Negative for adenopathy. Does not bruise/bleed easily.  Psychiatric/Behavioral: Negative for confusion.    Blood pressure 142/82, pulse 76, temperature 97.2 F (36.2 C), temperature source Temporal, resp. rate 20, height 5\' 11"  (1.803 m), weight 216 lb 3.2 oz (98.068 kg).   Physical Exam  Constitutional: She is oriented to person, place, and time. She appears well-developed and well-nourished. No distress.  HENT:   Head: Normocephalic and atraumatic.   Nose: Nose normal.   Mouth/Throat: No oropharyngeal exudate.  Eyes: Conjunctivae normal and EOM are normal. Pupils are equal, round, and reactive to light. Left eye exhibits no discharge. No scleral icterus.  Neck: Neck supple. No JVD present. No tracheal deviation present. No thyromegaly present.  Cardiovascular: Normal rate, regular rhythm, normal heart sounds and intact distal pulses.    No murmur heard. Pulmonary/Chest: Effort normal and breath sounds normal. No respiratory distress. She has no wheezes. She has no rales. She exhibits no tenderness.  Abdominal: Soft. Bowel sounds are normal. She exhibits no distension and no mass. There is no tenderness. There is no rebound and no guarding.       She has a midline scar beginning above the umbilicus and extending to the suprapubic area from her previous ovarian cystectomy. There is a small, nontender, and reducible hernia around the umbilicus. There is a transverse  Pfannenstiel incision from remote tubal ligation.  Musculoskeletal: She exhibits no edema and no tenderness.  Lymphadenopathy:    She has no cervical adenopathy.  Neurological: She is alert and oriented to person, place, and time. She exhibits normal muscle tone. Coordination normal.  Skin:  Skin is warm. No rash noted. She is not diaphoretic. No erythema. No pallor.  Psychiatric: She has a normal mood and affect. Her behavior is normal. Judgment and thought content normal.    Data Reviewed Colonoscopy report, pathology report, CT scan, hospital records and lab tests   Assessment Adenocarcinoma of hepatic flexure, circumferential, 6 cm length. Symptomatically not stretch of N. Not actively bleeding no evidence of metastatic disease.   Chronic anemia secondary colon cancer, status post 3 units transfusion with good result   Asymptomatic gallstones   Ventral incisional hernia   1.2 cm right renal lesion of uncertain significance   Hypertension   Non-insulin-dependent diabetes mellitus   Mild obesity   Plan The patient is scheduled for laparoscopic assisted right colectomy and ventral hernia repair, possible open next Thursday. I have discussed indications, details, techniques, and numerous risk of the surgery with the patient and her husband. They understand these issues. Their questions were answered. They agree with this plan.   She'll undergo a bowel prep one day preop   Plan entereg protocol   MRI of kidney preop to further characterize the right renal lesion. I told her that we would not plan any surgery on this at this time but would involve a urologist if it looked more suspicious on MRI   We talked about her asymptomatic gallstones. She does not want to have her gallbladder removed unless it is absolutely essential. It is not. She is aware that she may or may not develop symptoms in the future.       Angelia Mould. Derrell Lolling, M.D., [facility] General and Minimally invasive Surgery Breast and Colorectal Surgery Office:   [phone] Pager:   [phone]

## 2012-03-12 NOTE — Progress Notes (Signed)
CBC routed ti Inbox of Dr Claud Kelp.

## 2012-03-13 ENCOUNTER — Other Ambulatory Visit (HOSPITAL_COMMUNITY): Payer: Medicare Other

## 2012-03-13 ENCOUNTER — Encounter (INDEPENDENT_AMBULATORY_CARE_PROVIDER_SITE_OTHER): Payer: Self-pay

## 2012-03-14 ENCOUNTER — Inpatient Hospital Stay (HOSPITAL_COMMUNITY)
Admission: RE | Admit: 2012-03-14 | Discharge: 2012-03-21 | DRG: 330 | Disposition: A | Discharge status: Home / Self Care | Payer: Medicare Other | Source: Ambulatory Visit | Attending: General Surgery | Admitting: General Surgery

## 2012-03-14 ENCOUNTER — Encounter (HOSPITAL_COMMUNITY): Payer: Self-pay | Admitting: Anesthesiology

## 2012-03-14 ENCOUNTER — Ambulatory Visit (HOSPITAL_COMMUNITY): Payer: Medicare Other | Admitting: Anesthesiology

## 2012-03-14 ENCOUNTER — Encounter (HOSPITAL_COMMUNITY)
Admission: RE | Disposition: A | Discharge status: Home / Self Care | Payer: Self-pay | Source: Ambulatory Visit | Attending: General Surgery

## 2012-03-14 ENCOUNTER — Encounter (HOSPITAL_COMMUNITY): Payer: Self-pay | Admitting: *Deleted

## 2012-03-14 DIAGNOSIS — E119 Type 2 diabetes mellitus without complications: Secondary | ICD-10-CM | POA: Diagnosis not present

## 2012-03-14 DIAGNOSIS — C183 Malignant neoplasm of hepatic flexure: Secondary | ICD-10-CM | POA: Diagnosis not present

## 2012-03-14 DIAGNOSIS — E039 Hypothyroidism, unspecified: Secondary | ICD-10-CM | POA: Diagnosis present

## 2012-03-14 DIAGNOSIS — N289 Disorder of kidney and ureter, unspecified: Secondary | ICD-10-CM | POA: Diagnosis present

## 2012-03-14 DIAGNOSIS — K929 Disease of digestive system, unspecified: Secondary | ICD-10-CM | POA: Diagnosis not present

## 2012-03-14 DIAGNOSIS — M129 Arthropathy, unspecified: Secondary | ICD-10-CM | POA: Diagnosis present

## 2012-03-14 DIAGNOSIS — D630 Anemia in neoplastic disease: Secondary | ICD-10-CM | POA: Diagnosis present

## 2012-03-14 DIAGNOSIS — K660 Peritoneal adhesions (postprocedural) (postinfection): Secondary | ICD-10-CM | POA: Diagnosis not present

## 2012-03-14 DIAGNOSIS — K560 Paralytic ileus: Secondary | ICD-10-CM | POA: Diagnosis not present

## 2012-03-14 DIAGNOSIS — Z6830 Body mass index (BMI) 30.0-30.9, adult: Secondary | ICD-10-CM | POA: Diagnosis not present

## 2012-03-14 DIAGNOSIS — D131 Benign neoplasm of stomach: Secondary | ICD-10-CM | POA: Diagnosis present

## 2012-03-14 DIAGNOSIS — E669 Obesity, unspecified: Secondary | ICD-10-CM | POA: Diagnosis present

## 2012-03-14 DIAGNOSIS — K6389 Other specified diseases of intestine: Secondary | ICD-10-CM | POA: Diagnosis not present

## 2012-03-14 DIAGNOSIS — Z9079 Acquired absence of other genital organ(s): Secondary | ICD-10-CM | POA: Diagnosis not present

## 2012-03-14 DIAGNOSIS — Z79899 Other long term (current) drug therapy: Secondary | ICD-10-CM | POA: Diagnosis not present

## 2012-03-14 DIAGNOSIS — C189 Malignant neoplasm of colon, unspecified: Secondary | ICD-10-CM | POA: Diagnosis not present

## 2012-03-14 DIAGNOSIS — Z5331 Laparoscopic surgical procedure converted to open procedure: Secondary | ICD-10-CM | POA: Diagnosis not present

## 2012-03-14 DIAGNOSIS — K432 Incisional hernia without obstruction or gangrene: Secondary | ICD-10-CM | POA: Diagnosis not present

## 2012-03-14 DIAGNOSIS — K469 Unspecified abdominal hernia without obstruction or gangrene: Secondary | ICD-10-CM | POA: Diagnosis not present

## 2012-03-14 DIAGNOSIS — D010 Carcinoma in situ of colon: Secondary | ICD-10-CM | POA: Diagnosis not present

## 2012-03-14 DIAGNOSIS — I10 Essential (primary) hypertension: Secondary | ICD-10-CM | POA: Diagnosis not present

## 2012-03-14 DIAGNOSIS — K8020 Calculus of gallbladder without cholecystitis without obstruction: Secondary | ICD-10-CM | POA: Diagnosis present

## 2012-03-14 DIAGNOSIS — E785 Hyperlipidemia, unspecified: Secondary | ICD-10-CM | POA: Diagnosis present

## 2012-03-14 HISTORY — PX: LAPAROSCOPY: SHX197

## 2012-03-14 LAB — GLUCOSE, CAPILLARY: Glucose-Capillary: 129 mg/dL — ABNORMAL HIGH (ref 70–99)

## 2012-03-14 LAB — CBC
HCT: 27.7 % — ABNORMAL LOW (ref 36.0–46.0)
MCH: 20.1 pg — ABNORMAL LOW (ref 26.0–34.0)
MCV: 68.9 fL — ABNORMAL LOW (ref 78.0–100.0)
Platelets: 358 10*3/uL (ref 150–400)
RDW: 23.7 % — ABNORMAL HIGH (ref 11.5–15.5)
WBC: 13.7 10*3/uL — ABNORMAL HIGH (ref 4.0–10.5)

## 2012-03-14 LAB — BASIC METABOLIC PANEL
BUN: 7 mg/dL (ref 6–23)
CO2: 25 mEq/L (ref 19–32)
Calcium: 8.6 mg/dL (ref 8.4–10.5)
Chloride: 102 mEq/L (ref 96–112)
Creatinine, Ser: 0.62 mg/dL (ref 0.50–1.10)

## 2012-03-14 LAB — PREPARE RBC (CROSSMATCH)

## 2012-03-14 LAB — ABO/RH: ABO/RH(D): A POS

## 2012-03-14 SURGERY — LAPAROSCOPY, DIAGNOSTIC
Anesthesia: General | Site: Abdomen | Laterality: Right | Wound class: Contaminated

## 2012-03-14 MED ORDER — HYDROMORPHONE HCL PF 1 MG/ML IJ SOLN
INTRAMUSCULAR | Status: AC
  Filled 2012-03-14: qty 1

## 2012-03-14 MED ORDER — ONDANSETRON HCL 4 MG/2ML IJ SOLN
INTRAMUSCULAR | Status: DC | PRN
  Administered 2012-03-14: 4 mg via INTRAVENOUS

## 2012-03-14 MED ORDER — LACTATED RINGERS IR SOLN
Status: DC | PRN
  Administered 2012-03-14: 1

## 2012-03-14 MED ORDER — LACTATED RINGERS IV SOLN
INTRAVENOUS | Status: DC
  Administered 2012-03-14 (×4): via INTRAVENOUS
  Administered 2012-03-14: 1000 mL via INTRAVENOUS

## 2012-03-14 MED ORDER — HYDROMORPHONE HCL PF 1 MG/ML IJ SOLN
INTRAMUSCULAR | Status: DC | PRN
  Administered 2012-03-14 (×2): 1 mg via INTRAVENOUS

## 2012-03-14 MED ORDER — POTASSIUM CHLORIDE IN NACL 20-0.9 MEQ/L-% IV SOLN
INTRAVENOUS | Status: AC
  Filled 2012-03-14: qty 1000

## 2012-03-14 MED ORDER — INSULIN ASPART 100 UNIT/ML ~~LOC~~ SOLN
0.0000 [IU] | SUBCUTANEOUS | Status: DC
  Administered 2012-03-14: 4 [IU] via SUBCUTANEOUS
  Administered 2012-03-15: 3 [IU] via SUBCUTANEOUS
  Administered 2012-03-15: 4 [IU] via SUBCUTANEOUS
  Administered 2012-03-15: 3 [IU] via SUBCUTANEOUS
  Administered 2012-03-16: 100 [IU] via SUBCUTANEOUS
  Administered 2012-03-16: 4 [IU] via SUBCUTANEOUS
  Administered 2012-03-16 (×3): 3 [IU] via SUBCUTANEOUS
  Administered 2012-03-16: 5 [IU] via SUBCUTANEOUS
  Administered 2012-03-17 – 2012-03-20 (×9): 3 [IU] via SUBCUTANEOUS
  Administered 2012-03-20: 4 [IU] via SUBCUTANEOUS
  Administered 2012-03-20: 3 [IU] via SUBCUTANEOUS

## 2012-03-14 MED ORDER — SUCCINYLCHOLINE CHLORIDE 20 MG/ML IJ SOLN
INTRAMUSCULAR | Status: DC | PRN
  Administered 2012-03-14: 100 mg via INTRAVENOUS

## 2012-03-14 MED ORDER — HEPARIN SODIUM (PORCINE) 5000 UNIT/ML IJ SOLN
5000.0000 [IU] | Freq: Three times a day (TID) | INTRAMUSCULAR | Status: DC
  Administered 2012-03-15 – 2012-03-21 (×18): 5000 [IU] via SUBCUTANEOUS
  Filled 2012-03-14 (×21): qty 1

## 2012-03-14 MED ORDER — ROCURONIUM BROMIDE 100 MG/10ML IV SOLN
INTRAVENOUS | Status: DC | PRN
  Administered 2012-03-14: 10 mg via INTRAVENOUS
  Administered 2012-03-14: 40 mg via INTRAVENOUS
  Administered 2012-03-14: 10 mg via INTRAVENOUS
  Administered 2012-03-14: 5 mg via INTRAVENOUS
  Administered 2012-03-14 (×2): 10 mg via INTRAVENOUS
  Administered 2012-03-14: 5 mg via INTRAVENOUS

## 2012-03-14 MED ORDER — FENTANYL CITRATE 0.05 MG/ML IJ SOLN
INTRAMUSCULAR | Status: DC | PRN
  Administered 2012-03-14 (×2): 100 ug via INTRAVENOUS
  Administered 2012-03-14: 50 ug via INTRAVENOUS

## 2012-03-14 MED ORDER — ACETAMINOPHEN 10 MG/ML IV SOLN
INTRAVENOUS | Status: AC
  Filled 2012-03-14: qty 100

## 2012-03-14 MED ORDER — NEOSTIGMINE METHYLSULFATE 1 MG/ML IJ SOLN
INTRAMUSCULAR | Status: DC | PRN
  Administered 2012-03-14: 5 mg via INTRAVENOUS

## 2012-03-14 MED ORDER — GLYCOPYRROLATE 0.2 MG/ML IJ SOLN
INTRAMUSCULAR | Status: DC | PRN
  Administered 2012-03-14: 0.6 mg via INTRAVENOUS

## 2012-03-14 MED ORDER — HYDROMORPHONE HCL PF 1 MG/ML IJ SOLN
1.0000 mg | INTRAMUSCULAR | Status: DC | PRN
  Administered 2012-03-14 – 2012-03-19 (×14): 1 mg via INTRAVENOUS
  Filled 2012-03-14 (×14): qty 1

## 2012-03-14 MED ORDER — LACTATED RINGERS IV SOLN: INTRAVENOUS | Status: DC

## 2012-03-14 MED ORDER — 0.9 % SODIUM CHLORIDE (POUR BTL) OPTIME
TOPICAL | Status: DC | PRN
  Administered 2012-03-14: 8000 mL

## 2012-03-14 MED ORDER — SODIUM CHLORIDE 0.9 % IV SOLN
1.0000 g | INTRAVENOUS | Status: AC
  Administered 2012-03-15: 1 g via INTRAVENOUS
  Filled 2012-03-14: qty 1

## 2012-03-14 MED ORDER — METOPROLOL TARTRATE 1 MG/ML IV SOLN
5.0000 mg | Freq: Four times a day (QID) | INTRAVENOUS | Status: DC
  Administered 2012-03-14 – 2012-03-20 (×21): 5 mg via INTRAVENOUS
  Filled 2012-03-14 (×29): qty 5

## 2012-03-14 MED ORDER — BUPIVACAINE-EPINEPHRINE (PF) 0.5% -1:200000 IJ SOLN
INTRAMUSCULAR | Status: AC
  Filled 2012-03-14: qty 10

## 2012-03-14 MED ORDER — ALVIMOPAN 12 MG PO CAPS
12.0000 mg | ORAL_CAPSULE | Freq: Two times a day (BID) | ORAL | Status: DC
  Administered 2012-03-15 – 2012-03-18 (×8): 12 mg via ORAL
  Filled 2012-03-14 (×10): qty 1

## 2012-03-14 MED ORDER — CHLORHEXIDINE GLUCONATE 4 % EX LIQD
1.0000 "application " | Freq: Once | CUTANEOUS | Status: DC
  Filled 2012-03-14: qty 15

## 2012-03-14 MED ORDER — LIDOCAINE HCL (CARDIAC) 20 MG/ML IV SOLN
INTRAVENOUS | Status: DC | PRN
  Administered 2012-03-14: 50 mg via INTRAVENOUS

## 2012-03-14 MED ORDER — POTASSIUM CHLORIDE IN NACL 20-0.9 MEQ/L-% IV SOLN
INTRAVENOUS | Status: DC
  Administered 2012-03-14 – 2012-03-15 (×2): via INTRAVENOUS
  Administered 2012-03-15: 125 mL/h via INTRAVENOUS
  Administered 2012-03-16 – 2012-03-19 (×9): via INTRAVENOUS
  Administered 2012-03-19: 125 mL via INTRAVENOUS
  Administered 2012-03-19 – 2012-03-20 (×2): via INTRAVENOUS
  Filled 2012-03-14 (×16): qty 1000

## 2012-03-14 MED ORDER — ONDANSETRON HCL 4 MG PO TABS: 4.0000 mg | ORAL_TABLET | Freq: Four times a day (QID) | ORAL | Status: DC | PRN

## 2012-03-14 MED ORDER — HYDROMORPHONE HCL PF 1 MG/ML IJ SOLN
0.2500 mg | INTRAMUSCULAR | Status: DC | PRN
  Administered 2012-03-14 (×2): 0.5 mg via INTRAVENOUS

## 2012-03-14 MED ORDER — ACETAMINOPHEN 10 MG/ML IV SOLN
INTRAVENOUS | Status: DC | PRN
  Administered 2012-03-14: 1000 mg via INTRAVENOUS

## 2012-03-14 MED ORDER — ERTAPENEM SODIUM 1 G IJ SOLR
1.0000 g | INTRAMUSCULAR | Status: AC
  Administered 2012-03-14: 1 g via INTRAVENOUS
  Filled 2012-03-14: qty 1

## 2012-03-14 MED ORDER — MIDAZOLAM HCL 5 MG/5ML IJ SOLN
INTRAMUSCULAR | Status: DC | PRN
  Administered 2012-03-14: 2 mg via INTRAVENOUS

## 2012-03-14 MED ORDER — LEVOTHYROXINE SODIUM 100 MCG IV SOLR
75.0000 ug | Freq: Every day | INTRAVENOUS | Status: DC
  Administered 2012-03-15 – 2012-03-19 (×5): 76 ug via INTRAVENOUS
  Filled 2012-03-14 (×6): qty 3.8

## 2012-03-14 MED ORDER — DEXAMETHASONE SODIUM PHOSPHATE 10 MG/ML IJ SOLN
INTRAMUSCULAR | Status: DC | PRN
  Administered 2012-03-14: 10 mg via INTRAVENOUS

## 2012-03-14 MED ORDER — HEPARIN SODIUM (PORCINE) 5000 UNIT/ML IJ SOLN
5000.0000 [IU] | Freq: Once | INTRAMUSCULAR | Status: AC
  Administered 2012-03-14: 5000 [IU] via SUBCUTANEOUS
  Filled 2012-03-14: qty 1

## 2012-03-14 MED ORDER — BUPIVACAINE-EPINEPHRINE 0.5% -1:200000 IJ SOLN
INTRAMUSCULAR | Status: DC | PRN
  Administered 2012-03-14: 10 mL

## 2012-03-14 MED ORDER — ONDANSETRON HCL 4 MG/2ML IJ SOLN: 4.0000 mg | Freq: Four times a day (QID) | INTRAMUSCULAR | Status: DC | PRN

## 2012-03-14 MED ORDER — PROPOFOL 10 MG/ML IV BOLUS
INTRAVENOUS | Status: DC | PRN
  Administered 2012-03-14: 180 mg via INTRAVENOUS

## 2012-03-14 MED ADMIN — Alvimopan Cap 12 MG: 12 mg | ORAL | NDC 67919002010

## 2012-03-14 MED FILL — Alvimopan Cap 12 MG: ORAL | Qty: 1 | Status: AC

## 2012-03-14 SURGICAL SUPPLY — 79 items
APPLIER CLIP 5 13 M/L LIGAMAX5 (MISCELLANEOUS)
APPLIER CLIP ROT 10 11.4 M/L (STAPLE)
BENZOIN TINCTURE PRP APPL 2/3 (GAUZE/BANDAGES/DRESSINGS) IMPLANT
BINDER ABD UNIV 12 45-62 (WOUND CARE) IMPLANT
BINDER ABDOMINAL 46IN 62IN (WOUND CARE)
BLADE EXTENDED COATED 6.5IN (ELECTRODE) ×4 IMPLANT
BLADE HEX COATED 2.75 (ELECTRODE) ×4 IMPLANT
BLADE SURG SZ10 CARB STEEL (BLADE) ×4 IMPLANT
CANISTER SUCTION 2500CC (MISCELLANEOUS) ×4 IMPLANT
CANNULA ENDOPATH XCEL 11M (ENDOMECHANICALS) ×8 IMPLANT
CELLS DAT CNTRL 66122 CELL SVR (MISCELLANEOUS) IMPLANT
CLAMP ENDO BABCK 10MM (STAPLE) IMPLANT
CLIP APPLIE 5 13 M/L LIGAMAX5 (MISCELLANEOUS) IMPLANT
CLIP APPLIE ROT 10 11.4 M/L (STAPLE) IMPLANT
CLOTH BEACON ORANGE TIMEOUT ST (SAFETY) ×4 IMPLANT
COVER MAYO STAND STRL (DRAPES) ×4 IMPLANT
DECANTER SPIKE VIAL GLASS SM (MISCELLANEOUS) ×4 IMPLANT
DEVICE SECURE STRAP 25 ABSORB (INSTRUMENTS) IMPLANT
DEVICE TROCAR PUNCTURE CLOSURE (ENDOMECHANICALS) IMPLANT
DISSECTOR BLUNT TIP ENDO 5MM (MISCELLANEOUS) IMPLANT
DRAPE LAPAROSCOPIC ABDOMINAL (DRAPES) ×4 IMPLANT
DRAPE LG THREE QUARTER DISP (DRAPES) ×4 IMPLANT
DRAPE POUCH INSTRU U-SHP 10X18 (DRAPES) IMPLANT
DRAPE WARM FLUID 44X44 (DRAPE) ×4 IMPLANT
DRESSING TELFA 8X3 (GAUZE/BANDAGES/DRESSINGS) ×4 IMPLANT
DRSG PAD ABDOMINAL 8X10 ST (GAUZE/BANDAGES/DRESSINGS) ×4 IMPLANT
ELECT REM PT RETURN 9FT ADLT (ELECTROSURGICAL) ×4
ELECTRODE REM PT RTRN 9FT ADLT (ELECTROSURGICAL) ×3 IMPLANT
ENSEAL DEVICE STD TIP 35CM (ENDOMECHANICALS) IMPLANT
GLOVE BIOGEL PI IND STRL 7.0 (GLOVE) ×3 IMPLANT
GLOVE BIOGEL PI INDICATOR 7.0 (GLOVE) ×1
GLOVE EUDERMIC 7 POWDERFREE (GLOVE) ×8 IMPLANT
GOWN STRL NON-REIN LRG LVL3 (GOWN DISPOSABLE) ×4 IMPLANT
GOWN STRL REIN XL XLG (GOWN DISPOSABLE) ×12 IMPLANT
GRASPER LAPSCPC 5X35 EPIX (ENDOMECHANICALS) IMPLANT
HAND ACTIVATED (MISCELLANEOUS) ×4 IMPLANT
KIT BASIN OR (CUSTOM PROCEDURE TRAY) ×4 IMPLANT
LEGGING LITHOTOMY PAIR STRL (DRAPES) IMPLANT
LIGASURE IMPACT 36 18CM CVD LR (INSTRUMENTS) ×4 IMPLANT
NEEDLE SPNL 22GX3.5 QUINCKE BK (NEEDLE) IMPLANT
NS IRRIG 1000ML POUR BTL (IV SOLUTION) ×4 IMPLANT
PEN SKIN MARKING BROAD (MISCELLANEOUS) ×4 IMPLANT
PENCIL BUTTON HOLSTER BLD 10FT (ELECTRODE) ×4 IMPLANT
POUCH SPECIMEN RETRIEVAL 10MM (ENDOMECHANICALS) IMPLANT
RELOAD PROXIMATE 75MM BLUE (ENDOMECHANICALS) ×8 IMPLANT
RTRCTR WOUND ALEXIS 18CM MED (MISCELLANEOUS)
SCISSORS LAP 5X35 DISP (ENDOMECHANICALS) ×4 IMPLANT
SEPRAFILM MEMBRANE 5X6 (MISCELLANEOUS) ×4 IMPLANT
SET IRRIG TUBING LAPAROSCOPIC (IRRIGATION / IRRIGATOR) ×4 IMPLANT
SOLUTION ANTI FOG 6CC (MISCELLANEOUS) ×4 IMPLANT
SPONGE GAUZE 4X4 12PLY (GAUZE/BANDAGES/DRESSINGS) IMPLANT
SPONGE LAP 18X18 X RAY DECT (DISPOSABLE) ×28 IMPLANT
STAPLER GUN LINEAR PROX 60 (STAPLE) ×4 IMPLANT
STAPLER VISISTAT 35W (STAPLE) ×4 IMPLANT
STRIP CLOSURE SKIN 1/2X4 (GAUZE/BANDAGES/DRESSINGS) IMPLANT
SUCTION POOLE TIP (SUCTIONS) ×8 IMPLANT
SUT MNCRL AB 4-0 PS2 18 (SUTURE) ×4 IMPLANT
SUT NOVA 0 T19/GS 22DT (SUTURE) IMPLANT
SUT NOVA 1 T20/GS 25DT (SUTURE) ×32 IMPLANT
SUT PDS AB 1 CT1 27 (SUTURE) ×8 IMPLANT
SUT PDS AB 1 TP1 96 (SUTURE) IMPLANT
SUT SILK 2 0 (SUTURE) ×3
SUT SILK 2 0 SH CR/8 (SUTURE) ×12 IMPLANT
SUT SILK 2-0 18XBRD TIE 12 (SUTURE) ×9 IMPLANT
SUT SILK 3 0 (SUTURE) ×1
SUT SILK 3 0 SH CR/8 (SUTURE) ×8 IMPLANT
SUT SILK 3-0 18XBRD TIE 12 (SUTURE) ×3 IMPLANT
TACKER 5MM HERNIA 3.5CML NAB (ENDOMECHANICALS) IMPLANT
TAPE CLOTH SURG 4X10 WHT LF (GAUZE/BANDAGES/DRESSINGS) ×4 IMPLANT
TOWEL OR 17X26 10 PK STRL BLUE (TOWEL DISPOSABLE) ×12 IMPLANT
TRAY FOLEY CATH 14FRSI W/METER (CATHETERS) ×4 IMPLANT
TRAY LAP CHOLE (CUSTOM PROCEDURE TRAY) ×4 IMPLANT
TROCAR BLADELESS OPT 5 75 (ENDOMECHANICALS) ×8 IMPLANT
TROCAR XCEL BLUNT TIP 100MML (ENDOMECHANICALS) IMPLANT
TROCAR XCEL NON-BLD 11X100MML (ENDOMECHANICALS) IMPLANT
TUBING INSUFFLATION 10FT LAP (TUBING) ×4 IMPLANT
WATER STERILE IRR 1500ML POUR (IV SOLUTION) IMPLANT
YANKAUER SUCT BULB TIP 10FT TU (MISCELLANEOUS) ×4 IMPLANT
YANKAUER SUCT BULB TIP NO VENT (SUCTIONS) ×4 IMPLANT

## 2012-03-14 NOTE — Transfer of Care (Signed)
Immediate Anesthesia Transfer of Care Note  Patient: Judith Stephens  Procedure(s) Performed: Procedure(s) (LRB) with comments: LAPAROSCOPY DIAGNOSTIC () - right colectomy LYSIS OF ADHESION () - ventral hernia repair  Patient Location: PACU  Anesthesia Type: General  Level of Consciousness: sedated  Airway & Oxygen Therapy: Patient Spontanous Breathing and Patient connected to face mask oxygen  Post-op Assessment: Report given to PACU RN and Post -op Vital signs reviewed and stable  Post vital signs: Reviewed and stable  Complications: No apparent anesthesia complications

## 2012-03-14 NOTE — Interval H&P Note (Signed)
History and Physical Interval Note:  03/14/2012 1:40 PM  Judith Stephens  has presented today for surgery, with the diagnosis of right colon mass, incisional hernia  The goals and the various methods of treatment have been discussed with the patient and family. After consideration of risks, benefits and other options for treatment, the patient has consented to  Procedure(s) (LRB) with comments: LAPAROSCOPIC RIGHT COLECTOMY (Right) - Laparoscopic Assisted Right Colectomy, Possible Open LAPAROSCOPIC VENTRAL HERNIA (N/A) - Ventral Incisional Hernia Repair, Possible Open as a surgical intervention .  The patient's history has been reviewed and the patient examined today , no change in status, stable for surgery.  I have reviewed the patient's chart and labs.  Questions were answered to the patient's satisfaction.     Ernestene Mention

## 2012-03-14 NOTE — Anesthesia Preprocedure Evaluation (Addendum)
Anesthesia Evaluation  Patient identified by MRN, date of birth, ID band Patient awake    Reviewed: Allergy & Precautions, H&P , NPO status , Patient's Chart, lab work & pertinent test results  Airway Mallampati: II TM Distance: >3 FB Neck ROM: full    Dental  (+) Dental Advisory Given and Caps,    Pulmonary neg pulmonary ROS,  breath sounds clear to auscultation  Pulmonary exam normal       Cardiovascular Exercise Tolerance: Good hypertension, Pt. on medications negative cardio ROS  Rhythm:regular Rate:Normal     Neuro/Psych negative neurological ROS  negative psych ROS   GI/Hepatic negative GI ROS, Neg liver ROS,   Endo/Other  negative endocrine ROSdiabetes, Well Controlled, Type 2, Oral Hypoglycemic AgentsHypothyroidism   Renal/GU negative Renal ROS  negative genitourinary   Musculoskeletal   Abdominal   Peds  Hematology negative hematology ROS (+) Blood dyscrasia, anemia ,   Anesthesia Other Findings   Reproductive/Obstetrics negative OB ROS                          Anesthesia Physical Anesthesia Plan  ASA: III  Anesthesia Plan: General   Post-op Pain Management:    Induction: Intravenous  Airway Management Planned: Oral ETT  Additional Equipment:   Intra-op Plan:   Post-operative Plan: Extubation in OR  Informed Consent: I have reviewed the patients History and Physical, chart, labs and discussed the procedure including the risks, benefits and alternatives for the proposed anesthesia with the patient or authorized representative who has indicated his/her understanding and acceptance.   Dental Advisory Given  Plan Discussed with: CRNA and Surgeon  Anesthesia Plan Comments:         Anesthesia Quick Evaluation

## 2012-03-14 NOTE — Anesthesia Postprocedure Evaluation (Signed)
Anesthesia Post Note  Patient: Judith Stephens  Procedure(s) Performed: Procedure(s) (LRB): LAPAROSCOPY DIAGNOSTIC () LYSIS OF ADHESION ()  Anesthesia type: General  Patient location: PACU  Post pain: Pain level controlled  Post assessment: Post-op Vital signs reviewed  Last Vitals:  Filed Vitals:   03/14/12 1730  BP: 116/60  Pulse: 70  Temp:   Resp: 18    Post vital signs: Reviewed  Level of consciousness: sedated  Complications: No apparent anesthesia complications

## 2012-03-14 NOTE — Anesthesia Procedure Notes (Signed)
Procedure Name: Intubation Date/Time: 03/14/2012 2:11 PM Performed by: Doran Clay Pre-anesthesia Checklist: Patient identified, Timeout performed, Emergency Drugs available, Suction available and Patient being monitored Patient Re-evaluated:Patient Re-evaluated prior to inductionOxygen Delivery Method: Circle system utilized Preoxygenation: Pre-oxygenation with 100% oxygen Intubation Type: IV induction Laryngoscope Size: Mac and 4 Grade View: Grade I Tube type: Oral Tube size: 7.0 mm Number of attempts: 1 Airway Equipment and Method: Stylet Placement Confirmation: ETT inserted through vocal cords under direct vision,  breath sounds checked- equal and bilateral and positive ETCO2 Secured at: 22 cm Tube secured with: Tape Dental Injury: Teeth and Oropharynx as per pre-operative assessment

## 2012-03-14 NOTE — Op Note (Signed)
Patient Name:           Judith Stephens   Date of Surgery:        03/14/2012  Pre op Diagnosis:      Adenocarcinoma of the hepatic flexure, ventral incisional hernia  Post op Diagnosis:    Adenocarcinoma of the hepatic flexure, ventral incisional hernia, severe extensive intra-abdominal adhesions  Procedure:                 Diagnostic laparoscopy, lysis of adhesions requiring 60 minutes, right colectomy, primary repair of ventral incisional hernia  Surgeon:                     Angelia Mould. Derrell Lolling, M.D., FACS  Assistant:                      Gaynelle Adu, M.D., FACS  Operative Indications:   Judith Stephens is a 71 y.o. female. She was referred by Dr. 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  This 71 year old African American female has developed dizziness recently, saw Dr. Lupe Carney, was found to be anemic and was referred to Dr. Dorena Cookey. Hemoglobin was low, possibly as low as 5.8, repeat 6.4. Recent upper endoscopy shows some benign-appearing gastric polyps. Recent colonoscopy showed a partially obstructing mass at the hepatic flexure which was biopsied and inked., said to be 6 cm in length, and biopsies show adenocarcinoma. He was not able to traverse the mass and the right colon was not visualized. CEA 3.2 (normal)   She was admitted to the hospital for transfusion last week for 12 hours, received 3 units of packed cells which raised her hemoglobin to 9.7, and she was discharged that evening. She feels better now..  A CT scan was done. This shows a small but constricting lesion near the hepatic flexure. She does have 2 gallstones, But these are asymptomatic and she does not want her gallbladder removed unless it is essential. There is no evidence of any liver metastasis or adenopathy. There is a small incisional hernia near the umbilicus with a loop of small bowel that does not appear obstructed. There is a 1.2 cm right renal  lesion Which looks benign on a preop MRI. She underwent bowel prep at home and is operated upon electively.   Operative Findings:       The patient had severe, moderately dense adhesions with numerous loops of small bowel caught in her ventral hernia and to the anterior bowel wall and to the pelvis. This took over one hour to take all the pieces down to define the anatomy. There was a large but mobile palpable mass at the hepatic flexure. There was no evidence of metastatic disease in the mesentery or in the liver. A standard right colectomy was performed. We repaired the ventral  hernia primarily with interrupted Novafil sutures after undermining the fascial defect.  Procedure in Detail:          Following the induction of general endotracheal anesthesia a Foley catheter, nasogastric tube were both placed. The abdomen was prepped and draped in a sterile fashion and surgical time out was performed. Intravenous antibiotics were given. A 5 mm optical trocar was placed in the left upper quadrant without difficulty. Pneumoperitoneum was created. We noticed numerous loops of small bowel both in the upper abdomen and  lower abdomen. I placed a 5 mm trocar in the left lower quadrant  and this confirmed very dense, very confluent adhesions and we felt that laparoscopic lysis of adhesions was not achievable. We abandoned the laparoscopic approach and removed the trocars. Midline laparotomy incision was made and ultimately this was probably 25 cm in length. We slowly went down through the fascia. In the lower incision there were some very hard areas which appeared calcified. We slowly lifted the fascia up and slowly dissected into the peritoneal space.  We took at least 60 minutes to open the midline incision and take all the small bowel adhesions down. We made 2 serosal injuries and closed those with 3-0 silk sutures. We ultimately were able to run the small bowel from the ileocecal valve back to the ligament of Treitz  and then back again. The tissues  were friable. We transected the terminal ileum with a GIA stapling device. We mobilized the transverse colon by dividing its lateral peritoneal attachments and mobilizing it medially. Some attachments were divided with the LigaSure device.   We transected the mid transverse colon with a GIA stapling device. We took down the right colon mesentery going back as far as we could. We identified and preserved the duodenum. Large right colic and the ileocolic vessels were clamped divided and ligated with 2-0 silk ties and 2-0 silk suture ligatures. Small vessels were divided with the LigaSure device. The tissues were actually friable and did not have a very good tissue integrity. We sent the specimen to the lab and  the pathologist and we had at least a 5 cm distal margin which was the closest margin. We felt this was more than adequate. We irrigated out the abdomen and pelvis. We checked for bleeding and  everything was controlled. The anastomosis was created between the terminal ileum and the midtransverse colon with the GIA stapling device and the defect was closed with a TA 60 stapling device. A few sutures of 3-0 silk were placed to reinforce the staple line at critical points. We then changed our gowns gloves and instruments. We closed the mesentery very carefully with figure-of-eight sutures of 2-0 silk. We checked the anatomy to make sure that nothing was twisted. We ran the small bowel one more time and it looked fine. We checked the anastomosis and it looked fine. Irrigated out with about 3 L of saline and we found no bleeding. The omentum was returned to its anatomic position. Seprafilm was placed, 4 sheets on top of the omentum. In the area of the umbilicus there was a hernia and we undermined the skin and subcutaneous tissue  and debrded the hernia sac. The midline fascia was closed with numerous interrupted sutures of #1 Novofil. The wound was irrigated and the skin was  closed loosely with skin staples and multiple Telfa wicks were placed. Clean bandage was placed and the patient taken to recovery in stable condition. EBL 300 cc. Counts correct. Complications none.     Angelia Mould. Derrell Lolling, M.D., FACS General and Minimally Invasive Surgery Breast and Colorectal Surgery  03/14/2012 5:30 PM

## 2012-03-15 ENCOUNTER — Encounter (HOSPITAL_COMMUNITY): Payer: Self-pay | Admitting: General Surgery

## 2012-03-15 LAB — GLUCOSE, CAPILLARY
Glucose-Capillary: 128 mg/dL — ABNORMAL HIGH (ref 70–99)
Glucose-Capillary: 134 mg/dL — ABNORMAL HIGH (ref 70–99)
Glucose-Capillary: 152 mg/dL — ABNORMAL HIGH (ref 70–99)

## 2012-03-15 LAB — CBC
HCT: 28.2 % — ABNORMAL LOW (ref 36.0–46.0)
Hemoglobin: 8.1 g/dL — ABNORMAL LOW (ref 12.0–15.0)
MCH: 20 pg — ABNORMAL LOW (ref 26.0–34.0)
MCV: 69.8 fL — ABNORMAL LOW (ref 78.0–100.0)
Platelets: 349 10*3/uL (ref 150–400)
RBC: 4.04 MIL/uL (ref 3.87–5.11)
WBC: 13.4 10*3/uL — ABNORMAL HIGH (ref 4.0–10.5)

## 2012-03-15 LAB — BASIC METABOLIC PANEL
CO2: 26 mEq/L (ref 19–32)
Calcium: 8.7 mg/dL (ref 8.4–10.5)
Chloride: 103 mEq/L (ref 96–112)
Creatinine, Ser: 0.69 mg/dL (ref 0.50–1.10)
Glucose, Bld: 139 mg/dL — ABNORMAL HIGH (ref 70–99)

## 2012-03-15 MED ORDER — PHENOL 1.4 % MT LIQD
1.0000 | OROMUCOSAL | Status: DC | PRN
  Administered 2012-03-15 (×3): 1 via OROMUCOSAL
  Filled 2012-03-15: qty 177

## 2012-03-15 NOTE — Progress Notes (Signed)
General Surgery Note  LOS: 1 day  POD# 1 Room - 1231 PCP - Dr. Adelene Amas  Assessment/Plan: 1.  LAPAROSCOPY DIAGNOSTIC, LYSIS OF ADHESION, right colectomy, primary repair of ventral incisional hernia - H. Derrell Lolling - 03/14/2012  Sore throat and abdomen.  Ileus.  Continue NGT.  To ambulate.  Recheck labs in AM.  Will leave in step down for today.  2.  Gallstones. 3.  Right renal lesion - uncertain significance. 4.  HTN. 5.  Diabetes mellitus 6.  Arthritis 7.  Anemia - Hgb - 8.1 - 03/15/2012 8.  DVT prophylaxis - SQ heparin 9.  Foley - to d/c  Subjective:  Doing well.  No issues other than pain. Objective:   Filed Vitals:   03/15/12 0600  BP: 129/72  Pulse: 82  Temp:   Resp: 15     Intake/Output from previous day:  09/26 0701 - 09/27 0700 In: 6272.9 [I.V.:6272.9] Out: 1610 [Urine:1110; Emesis/NG output:100; Blood:400]  Intake/Output this shift:  Total I/O In: 1372.9 [I.V.:1372.9] Out: 335 [Urine:335]   Physical Exam:   General: AA F who is alert and oriented.    HEENT: Normal. Pupils equal. .   Lungs: Some wheezing.  IS about 750cc   Abdomen: Soft.  Quiet   Wound: Covered.   Neurologic:  Grossly intact to motor and sensory function.   Psychiatric: Has normal mood and affect.   Lab Results:    [facility] 03/15/12 0331 03/14/12 1814  WBC 13.4* 13.7*  HGB 8.1* 8.1*  HCT 28.2* 27.7*  PLT 349 358    BMET   Basename 03/15/12 0331 03/14/12 1814  NA 138 137  K 4.2 3.5  CL 103 102  CO2 26 25  GLUCOSE 139* 171*  BUN 7 7  CREATININE 0.69 0.62  CALCIUM 8.7 8.6    PT/INR  No results found for this basename: LABPROT:2,INR:2 in the last 72 hours  ABG  No results found for this basename: PHART:2,PCO2:2,PO2:2,HCO3:2 in the last 72 hours   Studies/Results:  No results found.   Anti-infectives:   Anti-infectives     Start     Dose/Rate Route Frequency Ordered Stop   03/15/12 1200   ertapenem (INVANZ) 1 g in sodium chloride 0.9 % 50 mL IVPB        1 g 100  mL/hr over 30 Minutes Intravenous Every 24 hours 03/14/12 2101 03/16/12 1159   03/14/12 1146   ertapenem (INVANZ) 1 g in sodium chloride 0.9 % 50 mL IVPB        1 g 100 mL/hr over 30 Minutes Intravenous 60 min pre-op 03/14/12 1146 03/14/12 1412          Ovidio Kin, MD, FACS Pager: [phone],   [facility]: [phone] 03/15/2012

## 2012-03-15 NOTE — Progress Notes (Signed)
CARE MANAGEMENT NOTE 03/15/2012  Patient:  ISRAA, CABAN   Account Number:  [id number]  Date Initiated:  03/15/2012  Documentation initiated by:  Quinetta Shilling  Subjective/Objective Assessment:   pt with ca of colon and low hgb, requiring cardiac monitoring post op     Action/Plan:   home   Anticipated DC Date:  03/18/2012   Anticipated DC Plan:  HOME/SELF CARE  In-house referral  NA      DC Planning Services  NA      [facility]  NA   Choice offered to / List presented to:  NA   DME arranged  NA      DME agency  NA     HH arranged  NA      HH agency  NA   Status of service:  In process, will continue to follow Medicare Important Message given?  NA - LOS <3 / Initial given by admissions (If response is "NO", the following Medicare IM given date fields will be blank) Date Medicare IM given:   Date Additional Medicare IM given:    Discharge Disposition:    Per UR Regulation:  Reviewed for med. necessity/level of care/duration of stay  If discussed at Long Length of Stay Meetings, dates discussed:    Comments:  [id number]/Neythan Kozlov Earlene Plater, RN, BSN, CCM: CHART REVIEWED AND UPDATED. NO DISCHARGE NEEDS PRESENT AT THIS TIME. CASE MANAGEMENT [phone]

## 2012-03-16 LAB — GLUCOSE, CAPILLARY
Glucose-Capillary: 130 mg/dL — ABNORMAL HIGH (ref 70–99)
Glucose-Capillary: 150 mg/dL — ABNORMAL HIGH (ref 70–99)

## 2012-03-16 LAB — CBC WITH DIFFERENTIAL/PLATELET
Basophils Absolute: 0 10*3/uL (ref 0.0–0.1)
Eosinophils Absolute: 0 10*3/uL (ref 0.0–0.7)
Eosinophils Relative: 0 % (ref 0–5)
HCT: 26.9 % — ABNORMAL LOW (ref 36.0–46.0)
Hemoglobin: 7.7 g/dL — ABNORMAL LOW (ref 12.0–15.0)
Lymphocytes Relative: 13 % (ref 12–46)
Lymphs Abs: 2.9 10*3/uL (ref 0.7–4.0)
MCHC: 28.6 g/dL — ABNORMAL LOW (ref 30.0–36.0)
Monocytes Absolute: 2 10*3/uL — ABNORMAL HIGH (ref 0.1–1.0)
Monocytes Relative: 9 % (ref 3–12)
Neutrophils Relative %: 78 % — ABNORMAL HIGH (ref 43–77)
RBC: 3.79 MIL/uL — ABNORMAL LOW (ref 3.87–5.11)

## 2012-03-16 LAB — BASIC METABOLIC PANEL
Calcium: 9.3 mg/dL (ref 8.4–10.5)
Creatinine, Ser: 0.64 mg/dL (ref 0.50–1.10)
GFR calc Af Amer: >90 mL/min (ref >90)

## 2012-03-16 NOTE — Progress Notes (Signed)
General Surgery Note  LOS: 2 days  POD# 2 Room - 1231 PCP - Dr. Adelene Amas  Assessment/Plan: 1.  LAPAROSCOPY DIAGNOSTIC, LYSIS OF ADHESION, right colectomy, primary repair of ventral incisional hernia - H. Derrell Lolling - 03/14/2012  Ileus.  Keep NPO except very limited ice chips.  NGT out (it was basically already out)  WBC jumped to 22,200 - 03/16/2012  Transfer to floor.  2.  Gallstones. 3.  Right renal lesion - uncertain significance. 4.  HTN. 5.  Diabetes mellitus 6.  Arthritis 7.  Anemia  Hgb - 7.7 - 03/16/2012.  Repeat labs tomorrow. 8.  DVT prophylaxis - SQ heparin 9.  Foley - to d/c  Voiding well.  Subjective:  Doing well.  Feels a little better than yesterday.  She is voiding without difficulty.  NGT is only down 8 inches and as best I can tell, it has been that way since yesterday afternoon.  Only 100 cc out over last 12 hours.  Will d/c NGT, but limit ice chips.   Objective:   Filed Vitals:   03/16/12 0800  BP: 145/72  Pulse: 99  Temp: 99.2 F (37.3 C)  Resp: 16     Intake/Output from previous day:  09/27 0701 - 09/28 0700 In: 2000 [I.V.:2000] Out: 870 [Urine:520; Emesis/NG output:350]  Intake/Output this shift:  Total I/O In: 250 [I.V.:250] Out: 400 [Urine:400]   Physical Exam:   General: AA F who is alert and oriented.    HEENT: Normal. Pupils equal. .   Lungs: Still modest inspiratory effort.  IS about 800cc   Abdomen:  Quiet   Wound:  Wound with wicks, clean.  Painted with betadine..   Neurologic:  Grossly intact to motor and sensory function.   Psychiatric: Has normal mood and affect.   Lab Results:     Basename 03/16/12 0342 03/15/12 0331  WBC 22.2* 13.4*  HGB 7.7* 8.1*  HCT 26.9* 28.2*  PLT 480* 349    BMET    Basename 03/16/12 0342 03/15/12 0331  NA 141 138  K 4.0 4.2  CL 106 103  CO2 23 26  GLUCOSE 154* 139*  BUN 5* 7  CREATININE 0.64 0.69  CALCIUM 9.3 8.7    PT/INR  No results found for this basename: LABPROT:2,INR:2 in the  last 72 hours  ABG  No results found for this basename: PHART:2,PCO2:2,PO2:2,HCO3:2 in the last 72 hours   Studies/Results:  No results found.   Anti-infectives:   Anti-infectives     Start     Dose/Rate Route Frequency Ordered Stop   03/15/12 1200   ertapenem (INVANZ) 1 g in sodium chloride 0.9 % 50 mL IVPB        1 g 100 mL/hr over 30 Minutes Intravenous Every 24 hours 03/14/12 2101 03/15/12 1158   03/14/12 1146   ertapenem (INVANZ) 1 g in sodium chloride 0.9 % 50 mL IVPB        1 g 100 mL/hr over 30 Minutes Intravenous 60 min pre-op 03/14/12 1146 03/14/12 1412          Ovidio Kin, MD, FACS Pager: [phone],   [facility]: [phone] 03/16/2012

## 2012-03-17 LAB — GLUCOSE, CAPILLARY
Glucose-Capillary: 108 mg/dL — ABNORMAL HIGH (ref 70–99)
Glucose-Capillary: 114 mg/dL — ABNORMAL HIGH (ref 70–99)
Glucose-Capillary: 126 mg/dL — ABNORMAL HIGH (ref 70–99)
Glucose-Capillary: 127 mg/dL — ABNORMAL HIGH (ref 70–99)
Glucose-Capillary: 132 mg/dL — ABNORMAL HIGH (ref 70–99)

## 2012-03-17 LAB — BASIC METABOLIC PANEL
CO2: 21 mEq/L (ref 19–32)
Chloride: 111 mEq/L (ref 96–112)
Potassium: 3.7 mEq/L (ref 3.5–5.1)
Sodium: 141 mEq/L (ref 135–145)

## 2012-03-17 LAB — CBC WITH DIFFERENTIAL/PLATELET
Eosinophils Relative: 0 % (ref 0–5)
MCV: 70.2 fL — ABNORMAL LOW (ref 78.0–100.0)
Monocytes Relative: 8 % (ref 3–12)
Neutrophils Relative %: 78 % — ABNORMAL HIGH (ref 43–77)
Platelets: 461 10*3/uL — ABNORMAL HIGH (ref 150–400)
RBC: 3.22 MIL/uL — ABNORMAL LOW (ref 3.87–5.11)
WBC: 18.3 10*3/uL — ABNORMAL HIGH (ref 4.0–10.5)

## 2012-03-17 NOTE — Progress Notes (Signed)
General Surgery Note  LOS: 3 days  POD# 3 Room - 1231 PCP - Dr. Adelene Amas  Assessment/Plan: 1.  LAPAROSCOPY DIAGNOSTIC, LYSIS OF ADHESION, right colectomy, primary repair of ventral incisional hernia - H. Derrell Lolling - 03/14/2012  Ileus.  Keep NPO except very limited ice chips.  NGT out (it was basically already out)  WBC jumped to 22,200 - 03/16/2012  Transfer to floor.  2.  Gallstones. 3.  Right renal lesion - uncertain significance. 4.  HTN. 5.  Diabetes mellitus 6.  Arthritis 7.  Anemia  Hgb - 6.5 - 03/17/2012. Vitals stable.  No signs of active bleeding.  Will transfuse 2 u and repeat labs tomorrow. 8.  DVT prophylaxis - SQ heparin 9.  Foley - Voiding well.  Subjective:  Doing well.  Feels a little better.  She is voiding without difficulty.  Ambulated in hall yesterday.  No nausea.  No flatus.     Objective:   Filed Vitals:   03/17/12 0655  BP: 148/83  Pulse: 83  Temp: 98.6 F (37 C)  Resp: 18     Intake/Output from previous day:  09/28 0701 - 09/29 0700 In: 3443.8 [I.V.:3443.8] Out: 1850 [Urine:1850]  Intake/Output this shift:      Physical Exam:   General: AA F who is alert and oriented.    HEENT: Normal. Pupils equal. .   Lungs: Still modest inspiratory effort.   Abdomen:  Quiet   Wound:  Wound with wicks, clean.  Painted with betadine..   Neurologic:  Grossly intact to motor and sensory function.   Psychiatric: Has normal mood and affect.   Lab Results:     Basename 03/17/12 0450 03/16/12 0342  WBC 18.3* 22.2*  HGB 6.5* 7.7*  HCT 22.6* 26.9*  PLT 461* 480*    BMET    Basename 03/17/12 0450 03/16/12 0342  NA 141 141  K 3.7 4.0  CL 111 106  CO2 21 23  GLUCOSE 113* 154*  BUN 7 5*  CREATININE 0.58 0.64  CALCIUM 9.0 9.3    PT/INR  No results found for this basename: LABPROT:2,INR:2 in the last 72 hours  ABG  No results found for this basename: PHART:2,PCO2:2,PO2:2,HCO3:2 in the last 72 hours   Studies/Results:  No results  found.   Anti-infectives:   Anti-infectives     Start     Dose/Rate Route Frequency Ordered Stop   03/15/12 1200   ertapenem (INVANZ) 1 g in sodium chloride 0.9 % 50 mL IVPB        1 g 100 mL/hr over 30 Minutes Intravenous Every 24 hours 03/14/12 2101 03/15/12 1158   03/14/12 1146   ertapenem (INVANZ) 1 g in sodium chloride 0.9 % 50 mL IVPB        1 g 100 mL/hr over 30 Minutes Intravenous 60 min pre-op 03/14/12 1146 03/14/12 1412          Ovidio Kin, MD, FACS Pager: [phone],   [facility]: [phone] 03/17/2012

## 2012-03-17 NOTE — Progress Notes (Signed)
CRITICAL VALUE ALERT  Critical value received:  hgb 6.5  Date of notification:  03/17/2012  Time of notification:  0600  Critical value read back:yes  Nurse who received alert:  Corene Cornea  MD notified (1st page):  Dr Ezzard Standing  Time of first page:  0609  MD notified (2nd page):  Time of second page:  Responding MD:  Dr Ezzard Standing  Time MD responded:  [phone]

## 2012-03-18 LAB — CBC WITH DIFFERENTIAL/PLATELET
Basophils Absolute: 0 10*3/uL (ref 0.0–0.1)
Eosinophils Absolute: 0.2 10*3/uL (ref 0.0–0.7)
Lymphocytes Relative: 16 % (ref 12–46)
MCHC: 30.3 g/dL (ref 30.0–36.0)
Monocytes Absolute: 1.4 10*3/uL — ABNORMAL HIGH (ref 0.1–1.0)
Neutro Abs: 11.8 10*3/uL — ABNORMAL HIGH (ref 1.7–7.7)
Neutrophils Relative %: 74 % (ref 43–77)
RDW: 25.4 % — ABNORMAL HIGH (ref 11.5–15.5)

## 2012-03-18 LAB — GLUCOSE, CAPILLARY
Glucose-Capillary: 109 mg/dL — ABNORMAL HIGH (ref 70–99)
Glucose-Capillary: 126 mg/dL — ABNORMAL HIGH (ref 70–99)

## 2012-03-18 LAB — TYPE AND SCREEN
ABO/RH(D): A POS
Antibody Screen: NEGATIVE
Unit division: 0
Unit division: 0

## 2012-03-18 NOTE — Plan of Care (Signed)
Problem: Consults Goal: General Medical Patient Education See Patient Education Module for specific education.  Outcome: Progressing Patient educated on the importance of ambulating in halls and staying up on chair out of bed.

## 2012-03-18 NOTE — Progress Notes (Signed)
General Surgery Note  LOS: 4 days  POD# 4 Room - 1537 PCP - Dr. Adelene Amas  Assessment/Plan: 1.  LAPAROSCOPY DIAGNOSTIC, LYSIS OF ADHESION, right colectomy, primary repair of ventral incisional hernia - H. Derrell Lolling - 03/14/2012  Ileus.  WBC - 15,900 0- 03/18/2012 - better.  Continue NPO.  Await bowel function.  Needs to ambulate more.  2.  Gallstones. 3.  Right renal lesion - uncertain significance. 4.  HTN. 5.  Diabetes mellitus  Glucose - 109 - 03/18/2012. 6.  Arthritis 7.  Anemia  Hgb - 8.9 - 03/18/2012.  Post 2 units transfusion 8.  DVT prophylaxis - SQ heparin  Subjective:  Doing well.  Still sore.  No BM or flatus, though she feels "rumbling".   Objective:   Filed Vitals:   03/18/12 0536  BP: 162/84  Pulse:   Temp:   Resp:      Intake/Output from previous day:  09/29 0701 - 09/30 0700 In: 3148.8 [P.O.:30; I.V.:2418.8; Blood:700] Out: 2150 [Urine:2150]  Intake/Output this shift:  Total I/O In: -  Out: 200 [Urine:200]   Physical Exam:   General: AA F who is alert and oriented.    HEENT: Normal. Pupils equal. .   Lungs: Clear.  IS about 1200c   Abdomen:  Remains quiet   Wound: Wicks removed.  Painted with betadine..   Neurologic:  Grossly intact to motor and sensory function.   Psychiatric: Has normal mood and affect.   Lab Results:     Basename 03/18/12 0402 03/17/12 0450  WBC 15.9* 18.3*  HGB 8.9* 6.5*  HCT 29.4* 22.6*  PLT 528* 461*    BMET    Basename 03/17/12 0450 03/16/12 0342  NA 141 141  K 3.7 4.0  CL 111 106  CO2 21 23  GLUCOSE 113* 154*  BUN 7 5*  CREATININE 0.58 0.64  CALCIUM 9.0 9.3    PT/INR  No results found for this basename: LABPROT:2,INR:2 in the last 72 hours  ABG  No results found for this basename: PHART:2,PCO2:2,PO2:2,HCO3:2 in the last 72 hours   Studies/Results:  No results found.   Anti-infectives:   Anti-infectives     Start     Dose/Rate Route Frequency Ordered Stop   03/15/12 1200   ertapenem (INVANZ) 1 g  in sodium chloride 0.9 % 50 mL IVPB        1 g 100 mL/hr over 30 Minutes Intravenous Every 24 hours 03/14/12 2101 03/15/12 1158   03/14/12 1146   ertapenem (INVANZ) 1 g in sodium chloride 0.9 % 50 mL IVPB        1 g 100 mL/hr over 30 Minutes Intravenous 60 min pre-op 03/14/12 1146 03/14/12 1412          Ovidio Kin, MD, FACS Pager: [phone],   [facility]: [phone] 03/18/2012

## 2012-03-19 LAB — GLUCOSE, CAPILLARY
Glucose-Capillary: 116 mg/dL — ABNORMAL HIGH (ref 70–99)
Glucose-Capillary: 124 mg/dL — ABNORMAL HIGH (ref 70–99)

## 2012-03-19 MED ORDER — HYDROCODONE-ACETAMINOPHEN 5-325 MG PO TABS: 1.0000 | ORAL_TABLET | ORAL | Status: DC | PRN

## 2012-03-19 NOTE — Progress Notes (Signed)
Pharmacy Brief Note - Alvimopan (Entereg)  The standing order set for alvimopan (Entereg) now includes an automatic order to discontinue the drug after the patient has had a bowel movement.  The change was approved by the Pharmacy & Therapeutics Committee and the Medical Executive Committee.    This patient has had multiple "large" bowel movementsdocumented by nursing.  Therefore, alvimopan has been discontinued.  If there are questions, please contact the pharmacy at [phone].  Thank you-  Juliette Alcide, PharmD, BCPS.   Pager: [phone] 03/19/2012 8:46 AM

## 2012-03-19 NOTE — Progress Notes (Signed)
General Surgery Note  LOS: 5 days  POD# 5 Room - 1537 PCP - Dr. Adelene Amas  Assessment/Plan: 1.  LAPAROSCOPY DIAGNOSTIC, LYSIS OF ADHESION, right colectomy, primary repair of ventral incisional hernia - H. Derrell Lolling - 03/14/2012  Had 3 BM's yesterday.  Feels better.  To start clear liquids.  2.  Gallstones. 3.  Right renal lesion - uncertain significance. 4.  HTN. 5.  Diabetes mellitus  Glucose - 113 - 03/19/2012. 6.  Arthritis 7.  Anemia  Hgb - 8.9 - 03/18/2012.  Post 2 units transfusion 8.  DVT prophylaxis - SQ heparin  Subjective:  Doing well.  Had BM's.  Hungry.  Wants a smoothie.   Objective:   Filed Vitals:   03/19/12 0549  BP: 168/86  Pulse: 92  Temp: 98.1 F (36.7 C)  Resp: 20     Intake/Output from previous day:  09/30 0701 - 10/01 0700 In: 1050 [I.V.:1050] Out: 1400 [Urine:1400]  Intake/Output this shift:      Physical Exam:   General: AA F who is alert and oriented.    HEENT: Normal. Pupils equal. .   Lungs: Clear.  Better inspiratory effort.   Abdomen:  Few BS.  Mild distention.   Wound: Looks good.  Re-painted with betadine.   Neurologic:  Grossly intact to motor and sensory function.   Psychiatric: Has normal mood and affect.   Lab Results:     Basename 03/18/12 0402 03/17/12 0450  WBC 15.9* 18.3*  HGB 8.9* 6.5*  HCT 29.4* 22.6*  PLT 528* 461*    BMET    Basename 03/17/12 0450  NA 141  K 3.7  CL 111  CO2 21  GLUCOSE 113*  BUN 7  CREATININE 0.58  CALCIUM 9.0    PT/INR  No results found for this basename: LABPROT:2,INR:2 in the last 72 hours  ABG  No results found for this basename: PHART:2,PCO2:2,PO2:2,HCO3:2 in the last 72 hours   Studies/Results:  No results found.   Anti-infectives:   Anti-infectives     Start     Dose/Rate Route Frequency Ordered Stop   03/15/12 1200   ertapenem (INVANZ) 1 g in sodium chloride 0.9 % 50 mL IVPB        1 g 100 mL/hr over 30 Minutes Intravenous Every 24 hours 03/14/12 2101 03/15/12 1158    03/14/12 1146   ertapenem (INVANZ) 1 g in sodium chloride 0.9 % 50 mL IVPB        1 g 100 mL/hr over 30 Minutes Intravenous 60 min pre-op 03/14/12 1146 03/14/12 1412          Ovidio Kin, MD, FACS Pager: [phone],   [facility]: [phone] 03/19/2012

## 2012-03-20 LAB — GLUCOSE, CAPILLARY: Glucose-Capillary: 103 mg/dL — ABNORMAL HIGH (ref 70–99)

## 2012-03-20 MED ORDER — ENALAPRIL MALEATE 10 MG PO TABS
10.0000 mg | ORAL_TABLET | Freq: Every day | ORAL | Status: DC
  Administered 2012-03-20: 10 mg via ORAL
  Filled 2012-03-20 (×2): qty 1

## 2012-03-20 MED ORDER — GLIPIZIDE ER 10 MG PO TB24
10.0000 mg | ORAL_TABLET | Freq: Every morning | ORAL | Status: DC
  Administered 2012-03-20: 10 mg via ORAL
  Filled 2012-03-20 (×2): qty 1

## 2012-03-20 MED ORDER — LEVOTHYROXINE SODIUM 137 MCG PO TABS
137.0000 ug | ORAL_TABLET | Freq: Every day | ORAL | Status: DC
  Administered 2012-03-20: 137 ug via ORAL
  Filled 2012-03-20 (×4): qty 1

## 2012-03-20 MED ORDER — ENALAPRIL-HYDROCHLOROTHIAZIDE 10-25 MG PO TABS: 1.0000 | ORAL_TABLET | Freq: Every morning | ORAL | Status: DC

## 2012-03-20 MED ORDER — AMLODIPINE BESYLATE 5 MG PO TABS
5.0000 mg | ORAL_TABLET | Freq: Every morning | ORAL | Status: DC
  Administered 2012-03-20: 5 mg via ORAL
  Filled 2012-03-20 (×2): qty 1

## 2012-03-20 MED ORDER — HYDROCHLOROTHIAZIDE 25 MG PO TABS
25.0000 mg | ORAL_TABLET | Freq: Every day | ORAL | Status: DC
  Administered 2012-03-20: 25 mg via ORAL
  Filled 2012-03-20 (×2): qty 1

## 2012-03-20 NOTE — Progress Notes (Signed)
General Surgery Note  LOS: 6 days  POD# 6 Room - 1537 PCP - Dr. Adelene Amas  Assessment/Plan: 1.  LAPAROSCOPY DIAGNOSTIC, LYSIS OF ADHESION, right colectomy, primary repair of ventral incisional hernia - H. Derrell Lolling - 03/14/2012  Clear liquids tolerated, to advance to reg diet.  Stop IVF.  Shower.  Possibly home tomorrow.  2.  Gallstones. 3.  Right renal lesion - uncertain significance. 4.  HTN.  Restart home meds, stop IV lopressor. 5.  Diabetes mellitus  Glucose - 131 - 03/20/2012.  Restart glipizide. 6.  Arthritis 7.  Anemia  Hgb - 8.9 - 03/18/2012.  Post 2 units transfusion 8.  DVT prophylaxis - SQ heparin  Subjective:  Tolerated liquids.  Had more BM's.   Objective:   Filed Vitals:   03/20/12 0548  BP: 135/84  Pulse: 86  Temp: 98.4 F (36.9 C)  Resp: 18     Intake/Output from previous day:  10/01 0701 - 10/02 0700 In: 5629.3 [P.O.:1080; I.V.:4549.3] Out: 2050 [Urine:2050]  Intake/Output this shift:      Physical Exam:   General: AA F who is alert and oriented.    HEENT: Normal. Pupils equal. .   Lungs: Clear.  Better inspiratory effort.   Abdomen:  Softer.  BS present.   Wound: Looks good.     Neurologic:  Grossly intact to motor and sensory function.   Psychiatric: Has normal mood and affect.   Lab Results:     Basename 03/18/12 0402  WBC 15.9*  HGB 8.9*  HCT 29.4*  PLT 528*    BMET   No results found for this basename: NA:2,K:2,CL:2,CO2:2,GLUCOSE:2,BUN:2,CREATININE:2,CALCIUM:2 in the last 72 hours  PT/INR  No results found for this basename: LABPROT:2,INR:2 in the last 72 hours  ABG  No results found for this basename: PHART:2,PCO2:2,PO2:2,HCO3:2 in the last 72 hours   Studies/Results:  No results found.   Anti-infectives:   Anti-infectives     Start     Dose/Rate Route Frequency Ordered Stop   03/15/12 1200   ertapenem (INVANZ) 1 g in sodium chloride 0.9 % 50 mL IVPB        1 g 100 mL/hr over 30 Minutes Intravenous Every 24 hours  03/14/12 2101 03/15/12 1158   03/14/12 1146   ertapenem (INVANZ) 1 g in sodium chloride 0.9 % 50 mL IVPB        1 g 100 mL/hr over 30 Minutes Intravenous 60 min pre-op 03/14/12 1146 03/14/12 1412          Ovidio Kin, MD, FACS Pager: [phone],   [facility]: [phone] 03/20/2012

## 2012-03-21 LAB — GLUCOSE, CAPILLARY
Glucose-Capillary: 79 mg/dL (ref 70–99)
Glucose-Capillary: 97 mg/dL (ref 70–99)

## 2012-03-21 NOTE — Progress Notes (Addendum)
General Surgery Note  LOS: 7 days  POD# 7 Room - 1537 PCP - Dr. Adelene Amas  Assessment/Plan: 1.  LAPAROSCOPY DIAGNOSTIC, LYSIS OF ADHESION, right colectomy, primary repair of ventral incisional hernia - H. Derrell Lolling - 03/14/2012  Final path:  T4a, N0 (0/15 nodes) moderately differentiated invasive adenoca - I gave patient copy of path.  She will talk to Dr. Derrell Lolling about an oncology referral.  Tolerated reg diet.  Did not get in shower.  Ready to go home.  D/C instructions reviewed.  2.  Gallstones. 3.  Right renal lesion - uncertain significance. 4.  HTN.  On home meds. 5.  Diabetes mellitus  Glucose - 79 - 03/21/2012.  On home glipizide. 6.  Arthritis 7.  Anemia  Hgb - 8.9 - 03/18/2012.  Post 2 units transfusion 8.  DVT prophylaxis - SQ heparin  Subjective:  Ready for D/C.   Objective:   Filed Vitals:   03/20/12 2209  BP: 157/84  Pulse: 87  Temp: 99 F (37.2 C)  Resp: 18     Intake/Output from previous day:  10/02 0701 - 10/03 0700 In: 984 [P.O.:720; I.V.:264] Out: 1000 [Urine:1000]  Intake/Output this shift:      Physical Exam:   General: AA F who is alert and oriented.    HEENT: Normal. Pupils equal. .   Lungs: Clear.  Better inspiratory effort.   Abdomen:  Softer.  BS present.   Wound: Looks good.  Will remove some staples.   Neurologic:  Grossly intact to motor and sensory function.   Psychiatric: Has normal mood and affect.   Lab Results:    No results found for this basename: WBC:2,HGB:2,HCT:2,PLT:2 in the last 72 hours  BMET   No results found for this basename: NA:2,K:2,CL:2,CO2:2,GLUCOSE:2,BUN:2,CREATININE:2,CALCIUM:2 in the last 72 hours  PT/INR  No results found for this basename: LABPROT:2,INR:2 in the last 72 hours  ABG  No results found for this basename: PHART:2,PCO2:2,PO2:2,HCO3:2 in the last 72 hours   Studies/Results:  No results found.   Anti-infectives:   Anti-infectives     Start     Dose/Rate Route Frequency Ordered Stop   03/15/12 1200   ertapenem (INVANZ) 1 g in sodium chloride 0.9 % 50 mL IVPB        1 g 100 mL/hr over 30 Minutes Intravenous Every 24 hours 03/14/12 2101 03/15/12 1158   03/14/12 1146   ertapenem (INVANZ) 1 g in sodium chloride 0.9 % 50 mL IVPB        1 g 100 mL/hr over 30 Minutes Intravenous 60 min pre-op 03/14/12 1146 03/14/12 1412          Ovidio Kin, MD, FACS Pager: [phone],   [facility] Office: [phone] 03/21/2012

## 2012-03-21 NOTE — Care Management Note (Signed)
    Page 1 of 2   03/21/2012     1:55:04 PM   CARE MANAGEMENT NOTE 03/21/2012  Patient:  Judith Stephens, Judith Stephens   Account Number:  [id number]  Date Initiated:  03/15/2012  Documentation initiated by:  DAVIS,RHONDA  Subjective/Objective Assessment:   pt with ca of colon and low hgb, requiring cardiac monitoring post op     Action/Plan:   home   Anticipated DC Date:  03/22/2012   Anticipated DC Plan:  HOME/SELF CARE  In-house referral  NA      DC Planning Services  CM consult      PAC Choice  NA   Choice offered to / List presented to:  NA   DME arranged  NA      DME agency  NA     HH arranged  NA      HH agency  NA   Status of service:  Completed, signed off Medicare Important Message given?  NA - LOS <3 / Initial given by admissions (If response is "NO", the following Medicare IM given date fields will be blank) Date Medicare IM given:   Date Additional Medicare IM given:    Discharge Disposition:  HOME/SELF CARE  Per UR Regulation:  Reviewed for med. necessity/level of care/duration of stay  If discussed at Long Length of Stay Meetings, dates discussed:    Comments:  [id number]/Rhonda Earlene Plater, RN, BSN, CCM: CHART REVIEWED AND UPDATED. NO DISCHARGE NEEDS PRESENT AT THIS TIME. CASE MANAGEMENT [phone]

## 2012-03-22 NOTE — Discharge Summary (Signed)
NAMEREFUJIA, CELLUCCI                  ACCOUNT NO.:  [id number]  MEDICAL RECORD NO.:  [id number]  LOCATION:  1537                         FACILITY:  [facility]  PHYSICIAN:  Sandria Bales. Ezzard Standing, M.D.  DATE OF BIRTH:  1940-07-19  DATE OF ADMISSION:  03/14/2012 DATE OF DISCHARGE:  03/21/2012                              DISCHARGE SUMMARY   DISCHARGE DIAGNOSES: 1. 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 2. Gallstones. 3. Right renal lesion of uncertain significance. 4. Hypertension. 5. Diabetes mellitus. 6. Arthritis. 7. Anemia.  Transfused 2 units during hospitalization.  OPERATIONS PERFORMED:  The patient underwent a diagnostic laparoscopy with extensive enterolysis of adhesions, requiring 60 minutes, a right hemicolectomy, primary repair of ventral incisional hernia by Dr. Claud Kelp on March 14, 2012.  HISTORY OF PRESENT ILLNESS:  Judith Stephens is a 71 year old African Americanfemale, who sees Dr. Lupe Carney as her primary care doctor, sees Dr. Dorena Cookey for evaluation of anemia.  She underwent a colonoscopy which showed a lesion at her hepatic flexure.  Her CEA preoperative was normal at 3.2.  She was anemic, was transfused to hemoglobin of 9.7, then discharged home.  Her CT scan showed a constricting lesion near the hepatic flexure, 2 gallstones, no evidence of liver metastasis, and incisional hernia near the umbilicus, and a 1.2 cm right renal lesion of uncertain significance.  HOSPITAL COURSE:  She was taken to the operating room on the day of admission, which was March 14, 2012 underwent a diagnostic laparoscopy, lysis of adhesions requiring 60 minutes, right colectomy, primary repair of ventral incisional hernia by Dr. Claud Kelp.  Her postop course, she was placed in the ICU/stepdown unit for 48 hours for observation.  Her NG tube was left in because of the extensive enterolysis of adhesions for 2  days, but then was removed.  Her white count postoperatively jumped up to 22,000.  The exact reason was unclear but then on followup labs the next day came down to 15,000.  Her hemoglobin dropped down to 6.5 on the 29th postoperative, so she was transfused 2 units of blood and her hemoglobin on September 30 was 8.9 post transfusion.  I think the Hgb change was due to dilution, in that she had no source of blood loss.  She was given subcu heparin for DVT prophylaxis postoperatively.  She was given Entereg for postoperative ileus.  On the fourth postoperative day, she had bowel movements and was started on clear liquids and her diet was advanced to a regular diet.  She is now 7 days postop.  She is afebrile.  The wicks had been removed from her wound.  I removed some of the staples but not all of these. She is tolerating diet and is ready for discharge.  DISCHARGE INSTRUCTIONS:  She can drive in 4-5 days.  She should no lifting more than 15 pounds until seen by Dr. Derrell Lolling. She can shower. Her diet is as tolerated. She is to see Dr. Derrell Lolling back in 1-2 weeks for wound check, removal of the staples.  I gave her a copy of her pathology report before  discharge and she will talk to Dr. Derrell Lolling on followup visit about arranging oncology consultation.  I gave her Vicodin for pain.    Her discharge condition was good.   Sandria Bales. Ezzard Standing, M.D., FACS   DHN/MEDQ  D:  03/21/2012  T:  03/22/2012  Job:  [id number]  cc:   Everardo All. Madilyn Fireman, M.D. Fax: [phone]  L. Lupe Carney, M.D. Fax: [phone]

## 2012-04-02 ENCOUNTER — Encounter (INDEPENDENT_AMBULATORY_CARE_PROVIDER_SITE_OTHER): Payer: Self-pay | Admitting: General Surgery

## 2012-04-02 ENCOUNTER — Other Ambulatory Visit (INDEPENDENT_AMBULATORY_CARE_PROVIDER_SITE_OTHER): Payer: Self-pay | Admitting: General Surgery

## 2012-04-02 ENCOUNTER — Ambulatory Visit (INDEPENDENT_AMBULATORY_CARE_PROVIDER_SITE_OTHER): Payer: Medicare Other | Admitting: General Surgery

## 2012-04-02 VITALS — BP 126/80 | HR 73 | Temp 97.6°F | Resp 18 | Ht 71.0 in | Wt 203.4 lb

## 2012-04-02 DIAGNOSIS — C189 Malignant neoplasm of colon, unspecified: Secondary | ICD-10-CM

## 2012-04-02 NOTE — Progress Notes (Signed)
Patient ID: Judith Stephens, female   DOB: 1940/10/24, 71 y.o.   MRN: [id number] This a pleasant 71 year old woman underwent laparoscopic-assisted right colectomy, extensive lysis of adhesions, and primary repair of ventral incisional hernia 03/14/2012. Final pathology report showed an invasive moderately differentiated adenocarcinoma invading into the pericolonic fatty tissue. Positive LVI. 15 lymph nodes negative. Hernia sac normal. Pathologic stage T4a, N0. Further biologic markers are pending. She is doing reasonably well. Her appetite has come back and she's having 2 bowel movements per day. Not much pain just a little sore. No pulmonary problems. No fever or chills. No wound problems.  Exam: Patient looks a little fatigued but in no distress. Husband is with her. Abdomen soft and nontender. Midline wound appears to be healing without complication. Staples are removed.  Assessment and plan: Adenocarcinoma of the hepatic flexure, pathologic stage T4a, N0. Recovering uneventfully in the early postop period following extensive lysis of adhesions and right colectomy. Preoperative anemia secondary to colon cancer  She'll be referred to medical oncology at this time for consultation. I told her they would probably check her blood count at that time  Diet and activities discussed. No sports or heavy lifting for another 3-4 weeks Return to see me in 4 weeks.   Angelia Mould. Derrell Lolling, M.D., [facility] General and Minimally invasive Surgery Breast and Colorectal Surgery Office:   [phone] Pager:   [phone]

## 2012-04-02 NOTE — Patient Instructions (Signed)
You seem to be recovering from your abdominal surgery and colon resection without any major complication. We removed your skin staples today.  Please follow a high fiber, low fat diet and drink lots of extra water.  Take a walk around the block daily  No sports or lifting more than 20 pounds for another 3 weeks.  We will make an appointment for you to see one of the medical oncologists to discuss other forms of treatment, if indicated for your colon cancer.  Return to see Dr. Derrell Lolling in 4 weeks.

## 2012-04-04 ENCOUNTER — Telehealth: Payer: Self-pay | Admitting: Oncology

## 2012-04-04 NOTE — Telephone Encounter (Signed)
S/W pt in re NP appt 11/1 @ 1:30 w/ Dr.Sherrill NP packet mailed.

## 2012-04-04 NOTE — Telephone Encounter (Signed)
C/D 04/04/12 for appt.04/19/12

## 2012-04-18 ENCOUNTER — Telehealth: Payer: Self-pay | Admitting: *Deleted

## 2012-04-18 NOTE — Telephone Encounter (Signed)
Spoke with patient by phone to introduce role of nurse navigator.  She was appreciative of the call.  Name and contact number were given to patient.  She was appreciative of the call.  Will continue to follow as needed.

## 2012-04-19 ENCOUNTER — Ambulatory Visit: Payer: Medicare Other

## 2012-04-19 ENCOUNTER — Encounter: Payer: Self-pay | Admitting: Oncology

## 2012-04-19 ENCOUNTER — Ambulatory Visit (HOSPITAL_BASED_OUTPATIENT_CLINIC_OR_DEPARTMENT_OTHER): Payer: Medicare Other | Admitting: Oncology

## 2012-04-19 VITALS — BP 116/77 | HR 88 | Temp 98.7°F | Resp 20 | Ht 71.0 in | Wt 202.1 lb

## 2012-04-19 DIAGNOSIS — D509 Iron deficiency anemia, unspecified: Secondary | ICD-10-CM | POA: Diagnosis not present

## 2012-04-19 DIAGNOSIS — C182 Malignant neoplasm of ascending colon: Secondary | ICD-10-CM

## 2012-04-19 DIAGNOSIS — I10 Essential (primary) hypertension: Secondary | ICD-10-CM

## 2012-04-19 DIAGNOSIS — E119 Type 2 diabetes mellitus without complications: Secondary | ICD-10-CM

## 2012-04-19 DIAGNOSIS — C189 Malignant neoplasm of colon, unspecified: Secondary | ICD-10-CM

## 2012-04-19 NOTE — Progress Notes (Signed)
Checked in new pt with no financial concerns. °

## 2012-04-19 NOTE — Progress Notes (Signed)
[facility] New Patient Consult   Referring MD: Maybel Leaman 71 y.o.  September 16, 1940    Reason for Referral: Colon cancer     HPI: She reports fatigue and "dizziness "for approximately 6 months. She saw Dr. Clovis Riley and a CBC was remarkable for severe anemia. On 03/05/2012 hemoglobin returned at 6.4 MCV of 63.4.  She was referred to Dr. Madilyn Fireman and underwent a colonoscopy on 03/04/2012. A partially obstructing mass was found in the proximal transverse colon. The mass was circumferential. Biopsies were taken and the area was injected with in the 4 tattooing. A few small polyps were found in the sigmoid, descending, and transverse colon. The biopsy confirmed adenocarcinoma.  She was transfused for the symptomatic anemia. She was referred to Dr. Derrell Lolling. A CT of the abdomen and pelvis on 03/05/2012 revealed clear lung bases. No evidence of metastatic disease to the liver. A cyst was noted at the upper pole of the right kidney measuring 3.67 m. An indeterminate lesion was noted at the right mid lower kidney measuring 1.8 cm. A small renal cell carcinoma could not be excluded the no adenopathy. A constricting lesion was noted within the a sending colon just below the hepatic flexure consistent with a diagnosis of colon cancer the no adenopathy was noted. The right renal lesion was further evaluated with an MRI on 03/12/2012. The findings were consistent with a nonenhancing hemorrhagic cyst.  She was taken to the operating room by Dr. Derrell Lolling on 03/14/2012 and underwent a diagnostic laparoscopy, lysis of adhesions, right colectomy, and repair of a ventral incisional hernia. A large mobile mass was found at the hepatic flexure. No evidence of metastatic disease in the mesentery or liver. A right colectomy was performed.  The pathology [id number]) confirmed an invasive moderately differentiated adenocarcinoma of the a sending colon. Tumor invaded through the muscular propria  into the pericolonic fatty tissue and focally involved the serosa. Angiolymphatic invasion was present. 15 lymph nodes were negative for metastatic carcinoma. Perineural invasion was not identified the resection margins were negative. 1 additional to prednisone with no evidence of high-grade dysplasia or malignancy was noted. A sample of the tumor was sent for microsatellite instability testing. No evidence of microsatellite instability by PCR. No loss of expression of a major or minor mismatch repair protein by immunostains.  She reports improvement in her strength following surgery.  Past Medical History  Diagnosis Date  . Hypertension   . Diabetes mellitus   . Hyperlipidemia       . Arthritis   . Hypothyroidism   . Anemia-microcytic      hx of blood transfusion 03/06/12     Past Surgical History  Procedure Date  . Oophorectomy  approximately 20 years ago   . Tubal ligation   . Laparoscopy 03/14/2012    Procedure: LAPAROSCOPY DIAGNOSTIC;  Surgeon: Ernestene Mention, MD;  Location: [facility];  Service: General;;  right colectomy    Family History  Problem Relation Age of Onset  . Hypertension Father   . Hypertension Sister     x3  . Hypertension Brother    5 sisters and 2 brothers, no family history of cancer  Current outpatient prescriptions:amLODipine (NORVASC) 10 MG tablet, Take 5 mg by mouth every morning. , Disp: , Rfl: ;  aspirin EC 81 MG tablet, Take 81 mg by mouth every morning., Disp: , Rfl: ;  enalapril-hydrochlorothiazide (VASERETIC) 10-25 MG per tablet, Take 1 tablet by mouth every morning. , Disp: ,  Rfl: ;  fluticasone (FLONASE) 50 MCG/ACT nasal spray, Place 2 sprays into the nose daily as needed. For allergies, Disp: , Rfl:  GLIPIZIDE XL 10 MG 24 hr tablet, Take 10 mg by mouth every morning. , Disp: , Rfl: ;  HYDROcodone-acetaminophen (NORCO/VICODIN) 5-325 MG per tablet, , Disp: , Rfl: ;  levothyroxine (SYNTHROID, LEVOTHROID) 137 MCG tablet, Take 137 mcg by mouth daily  before breakfast. , Disp: , Rfl: ;  loratadine (CLARITIN) 10 MG tablet, Take 10 mg by mouth daily. As needed for allergies, Disp: , Rfl:  pravastatin (PRAVACHOL) 40 MG tablet, Take 40 mg by mouth every morning. , Disp: , Rfl:   Allergies: No Known Allergies  Social History: She lives in [place]. She is retired and previously worked in Audiological scientist. She quit smoking cigarettes 20 years ago. She drinks wine occasionally. No red cell transfusion prior to September of 2013. No risk factor for HIV or hepatitis.  ROS:   Positives include: Fatigue and "dizziness "prior to the red cell transfusion.  A complete ROS was otherwise negative.  Physical Exam:  Blood pressure 116/77, pulse 88, temperature 98.7 F (37.1 C), temperature source Oral, resp. rate 20, height 5\' 11"  (1.803 m), weight 202 lb 1.6 oz (91.672 kg).  HEENT: Oral cavity without visible mass, neck without mass, I cannot visualize the pharynx Lungs: Clear bilaterally Cardiac: Regular rate and rhythm Abdomen: No hepatosplenomegaly, healed midline incision  Vascular: No leg edema Lymph nodes: No cervical, supraclavicular, axillary, or inguinal nodes Neurologic: Alert and oriented, the motor exam appears intact in the upper and lower Jevity's Skin: No rash Musculoskeletal: No spine tenderness   LAB:  CBC  Lab Results  Component Value Date   WBC 15.9* 03/18/2012   HGB 8.9* 03/18/2012   HCT 29.4* 03/18/2012   MCV 72.2* 03/18/2012   PLT 528* 03/18/2012     CMP      Component Value Date/Time   NA 141 03/17/2012 0450   K 3.7 03/17/2012 0450   CL 111 03/17/2012 0450   CO2 21 03/17/2012 0450   GLUCOSE 113* 03/17/2012 0450   BUN 7 03/17/2012 0450   CREATININE 0.58 03/17/2012 0450   CALCIUM 9.0 03/17/2012 0450   PROT 7.8 03/12/2012 1150   ALBUMIN 3.8 03/12/2012 1150   AST 18 03/12/2012 1150   ALT 13 03/12/2012 1150   ALKPHOS 55 03/12/2012 1150   BILITOT 0.4 03/12/2012 1150   GFRNONAA >90 03/17/2012 0450   GFRAA >90 03/17/2012 0450    CEA on 03/04/2012-3.2  Radiology: As per history of present illness    Assessment/Plan:   1. Stage IIb (T4aN0) moderately differentiated adenocarcinoma the a sending colon, status post a right colectomy on 03/14/2012 2. Severe microcytic anemia-likely secondary to iron deficiency related to bleeding from the colon tumor 3. Diabetes 4. Hypertension 5. Hyperlipidemia   Disposition:   She has been diagnosed with stage II colon cancer. I discussed the diagnosis, prognosis, and adjuvant treatment options with Ms. Schalow and her husband. We reviewed the surgical pathology report in detail. I explained the controversy surrounding the recommendation for adjuvant systemic chemotherapy in patients with resected stage II colon cancer. She does not have "high-risk "features such as bowel perforation, a markedly elevated CEA, or a limited number of sampled lymph nodes. The only high-risk feature associated with her tumor is the presence of microscopic lymphovascular invasion.  I estimated a disease-free survival in the 70-80% range in the absence of adjuvant systemic therapy. I explained the expected improvement in  the relapse rate with 5-fluorouracil-based adjuvant therapy. We specifically discussed capecitabine therapy. I recommended she consider adjuvant capecitabine. We discussed the potential for nausea, mucositis, diarrhea ,hematologic toxicity, skin rash, skin hyperpigmentation, and the hand/foot syndrome associated with capecitabine.  Her initial impression is to forego adjuvant therapy. She was given reading materials on capecitabine. Ms. Deleo and her husband will consider things over the next week and contact us by telephone with her decision regarding adjuvant therapy. She plans to continue clinical followup with Dr. Derrell Lolling if she does not undergo adjuvant therapy. I recommend a surveillance CEA every 6 months for the next 3-5 years. She should also undergo a surveillance colonoscopy in one  year.  Ms. Holloran will begin ferrous sulfate replacement. She will arrange for a repeat CBC via Dr. Jacinto Halim office if she does not return here for chemotherapy.  We did not schedule a followup appointment at the [facility]. She will contact us with her decision regarding adjuvant chemotherapy.  Vicci Reder 04/19/2012, 7:04 PM

## 2012-04-19 NOTE — Patient Instructions (Signed)
Review the materials provided on Xeloda and contact office if you decide to accept treatment. Start OTC ferrous sulfate 325 mg. If constipation develops, begin stool softener, Colace as needed.

## 2012-04-19 NOTE — Progress Notes (Signed)
Checks CBG's daily at home with range 80's to 140's. Reports incision is well healed.

## 2012-04-23 ENCOUNTER — Telehealth: Payer: Self-pay | Admitting: *Deleted

## 2012-04-23 NOTE — Telephone Encounter (Signed)
Received phone call from patient stating she has not decided on treatment, as recommended by Dr. Truett Perna.  She stated she is still considering the treatment, but wants to have more time to decide, along with seeing Dr. Derrell Lolling this week.  This RN explained to patient that she can attend the chemotherapy class and then make her decision.  Patient expressed appreciation and stated she would call back soon to let this RN know if she is interested in attending the chemo class and/or to let Dr. Truett Perna know of her decision.  Dr. Truett Perna was made aware.

## 2012-04-25 ENCOUNTER — Ambulatory Visit (INDEPENDENT_AMBULATORY_CARE_PROVIDER_SITE_OTHER): Payer: Medicare Other | Admitting: General Surgery

## 2012-04-25 ENCOUNTER — Encounter (INDEPENDENT_AMBULATORY_CARE_PROVIDER_SITE_OTHER): Payer: Self-pay | Admitting: General Surgery

## 2012-04-25 ENCOUNTER — Other Ambulatory Visit (INDEPENDENT_AMBULATORY_CARE_PROVIDER_SITE_OTHER): Payer: Self-pay | Admitting: General Surgery

## 2012-04-25 VITALS — BP 118/70 | HR 72 | Temp 97.6°F | Resp 18 | Ht 71.0 in | Wt 202.5 lb

## 2012-04-25 DIAGNOSIS — C189 Malignant neoplasm of colon, unspecified: Secondary | ICD-10-CM

## 2012-04-25 NOTE — Patient Instructions (Signed)
I recommend that you discuss with Dr. Truett Perna whether or not you need chemotherapy. It is a close call, from my point of view.  Continue to take the ferrous sulfate iron pills twice a day  You will be scheduled for CBC on approximately December 15.  Return to see Dr. Derrell Lolling in 5 months and we will get blood work, a CEA level, prior to the office visit  You will need a colonoscopy in one year.

## 2012-04-25 NOTE — Progress Notes (Signed)
Patient ID: Judith Stephens, female   DOB: February 23, 1941, 71 y.o.   MRN: [id number] History: This patient underwent laparoscopic lysis of adhesions requiring 60 minutes, right colectomy and ventral hernia repair on 03/14/2012. Final pathology showed T4a., N0 tumor. 0/15 lymph nodes positive. Preop CEA 1.0. She is doing well. No pain. No nausea. Appetite normal. Bowel function normal. She has seen Dr. Mancel Bale. They have discussed adjuvant chemotherapy. She is still undecided about this. I told her to discuss this further with Dr. Truett Perna but that probably it would be a good idea to take adjuvant chemotherapy. She has been started on iron therapy and just started 2 days ago.  Exam: Patient was well. No distress. Husband with her. Abdomen soft. Midline incision healed well. No hernia. No infection.  Assessment: Adenocarcinoma of the hepatic flexure, final pathologic stage T4a,., N0, recovering uneventfully following right colectomy and ventral hernia repair and extensive lysis of adhesions Preop anemia requiring transfusion  Plan: I've asked her to discuss adjuvant chemotherapy further with Dr. Truett Perna and that it  was probably a good idea but close call. Continue ferrous sulfate Will order CBC on December 15 at Dr. Quita Skye office Return to see me in 5 months and we'll get a CEA at that time Plans CEA every 6 months for 5 years Colonoscopy in one year with Dr. Ileene Hutchinson M. Derrell Lolling, M.D., [facility] General and Minimally invasive Surgery Breast and Colorectal Surgery Office:   [phone] Pager:   [phone]

## 2012-04-29 ENCOUNTER — Other Ambulatory Visit: Payer: Self-pay | Admitting: *Deleted

## 2012-04-29 ENCOUNTER — Telehealth: Payer: Self-pay | Admitting: *Deleted

## 2012-04-29 NOTE — Telephone Encounter (Signed)
Spoke with patient by phone.  She stated she wants to go with the treatment Dr. Truett Perna talked about and would like to come back and discuss with him.  She requested to be seen next week.  She stated her husband was having a procedure this week and that she could not  make any appointments until next week..  This information was given to Dr. Truett Perna who stated to have her come in next Tuesday, 05/07/12.  POF sent to scheduling.

## 2012-04-30 ENCOUNTER — Telehealth: Payer: Self-pay | Admitting: *Deleted

## 2012-04-30 NOTE — Telephone Encounter (Signed)
Patient confirmed over the phone the new date and time on 05-07-2012 at 12:00pm

## 2012-05-07 ENCOUNTER — Telehealth: Payer: Self-pay | Admitting: Oncology

## 2012-05-07 ENCOUNTER — Ambulatory Visit (HOSPITAL_BASED_OUTPATIENT_CLINIC_OR_DEPARTMENT_OTHER): Payer: Medicare Other | Admitting: Oncology

## 2012-05-07 ENCOUNTER — Encounter: Payer: Self-pay | Admitting: Oncology

## 2012-05-07 VITALS — BP 152/93 | HR 87 | Temp 98.8°F | Resp 20 | Ht 71.0 in | Wt 203.7 lb

## 2012-05-07 DIAGNOSIS — D509 Iron deficiency anemia, unspecified: Secondary | ICD-10-CM

## 2012-05-07 DIAGNOSIS — C182 Malignant neoplasm of ascending colon: Secondary | ICD-10-CM

## 2012-05-07 DIAGNOSIS — C189 Malignant neoplasm of colon, unspecified: Secondary | ICD-10-CM

## 2012-05-07 NOTE — Telephone Encounter (Signed)
Gave pt appt for lab, Ml and cfhemo class for November then see ML on December 2013 with labs

## 2012-05-07 NOTE — Progress Notes (Signed)
   [facility]    OFFICE PROGRESS NOTE   INTERVAL HISTORY:   She returns for additional discussion. She saw Dr. Derrell Lolling on 04/25/2012. She discuss the indication for adjuvant therapy with him. The abdominal wound has healed. No specific complaint today. She has reviewed reading materials on capecitabine.  Objective:  Vital signs in last 24 hours:  Blood pressure 152/93, pulse 87, temperature 98.8 F (37.1 C), temperature source Oral, resp. rate 20, height 5\' 11"  (1.803 m), weight 203 lb 11.2 oz (92.398 kg).   GI: Healed abdominal incision   Medications: I have reviewed the patient's current medications.  Assessment/Plan: 1. Stage IIb (T4aN0) moderately differentiated adenocarcinoma the a sending colon, status post a right colectomy on 03/14/2012  2. Severe microcytic anemia-likely secondary to iron deficiency related to bleeding from the colon tumor  3. Diabetes  4. Hypertension  5. Hyperlipidemia  Disposition:  I again reviewed the indication for adjuvant therapy with Judith Stephens and her husband. We specifically discussed the potential toxicities associated with capecitabine. We discussed the small expected absolute improvement in disease-free survival with adjuvant capecitabine therapy.  After thoroughly reviewing the potential toxicities associated with capecitabine and the treatment schedule she decided to proceed with adjuvant chemotherapy. The plan is to begin adjuvant therapy as soon as the capecitabine can be obtained. The plan is to take capecitabine on a 2 week on/one-week off schedule for 6 months. She will return for an office visit and CBC on 05/27/2012. Judith Stephens is scheduled for a chemotherapy teaching class on 05/09/2012.   Thornton Papas, MD  05/07/2012  1:57 PM

## 2012-05-07 NOTE — Progress Notes (Signed)
Faxed xeloda prescription to [facility] °

## 2012-05-08 ENCOUNTER — Other Ambulatory Visit: Payer: Self-pay | Admitting: *Deleted

## 2012-05-08 MED ORDER — CAPECITABINE 500 MG PO TABS: ORAL_TABLET | ORAL | Status: DC

## 2012-05-09 ENCOUNTER — Other Ambulatory Visit: Payer: Medicare Other

## 2012-05-09 ENCOUNTER — Encounter: Payer: Self-pay | Admitting: *Deleted

## 2012-05-13 ENCOUNTER — Telehealth: Payer: Self-pay | Admitting: *Deleted

## 2012-05-13 NOTE — Telephone Encounter (Signed)
Asking if OK to take Vitamin E and Calcium/Vit D and Vitamin B12 while on Xeloda?  Told her OK to take these vitamins. Avoid any preparation with folic acid.

## 2012-05-13 NOTE — Telephone Encounter (Signed)
RECEIVED A FAX FROM [employer] CONCERNING A CONFIRMATION OF PRESCRIPTION SHIPMENT FOR XELODA ON 05/10/12.

## 2012-05-14 ENCOUNTER — Telehealth: Payer: Self-pay | Admitting: *Deleted

## 2012-05-14 NOTE — Telephone Encounter (Signed)
Asking how far apart to take the Xeloda doses. Suggested she put at least 8 hours between each dose.

## 2012-05-27 ENCOUNTER — Telehealth: Payer: Self-pay | Admitting: Oncology

## 2012-05-27 ENCOUNTER — Ambulatory Visit (HOSPITAL_BASED_OUTPATIENT_CLINIC_OR_DEPARTMENT_OTHER): Payer: Medicare Other | Admitting: Nurse Practitioner

## 2012-05-27 ENCOUNTER — Other Ambulatory Visit: Payer: Self-pay | Admitting: *Deleted

## 2012-05-27 ENCOUNTER — Other Ambulatory Visit (HOSPITAL_BASED_OUTPATIENT_CLINIC_OR_DEPARTMENT_OTHER): Payer: Medicare Other | Admitting: Lab

## 2012-05-27 VITALS — BP 151/85 | HR 69 | Temp 97.8°F | Resp 20 | Ht 71.0 in | Wt 202.9 lb

## 2012-05-27 DIAGNOSIS — C189 Malignant neoplasm of colon, unspecified: Secondary | ICD-10-CM

## 2012-05-27 DIAGNOSIS — C182 Malignant neoplasm of ascending colon: Secondary | ICD-10-CM

## 2012-05-27 DIAGNOSIS — D509 Iron deficiency anemia, unspecified: Secondary | ICD-10-CM

## 2012-05-27 LAB — CBC WITH DIFFERENTIAL/PLATELET
EOS%: 1.2 % (ref 0.0–7.0)
Eosinophils Absolute: 0.1 10*3/uL (ref 0.0–0.5)
MCV: 80.4 fL (ref 79.5–101.0)
MONO%: 7.6 % (ref 0.0–14.0)
NEUT#: 2.2 10*3/uL (ref 1.5–6.5)
RBC: 4.5 10*6/uL (ref 3.70–5.45)
RDW: 27.9 % — ABNORMAL HIGH (ref 11.2–14.5)

## 2012-05-27 MED ORDER — CAPECITABINE 500 MG PO TABS: ORAL_TABLET | ORAL | Status: DC

## 2012-05-27 MED ORDER — PROCHLORPERAZINE MALEATE 10 MG PO TABS: 5.0000 mg | ORAL_TABLET | Freq: Four times a day (QID) | ORAL | Status: DC | PRN

## 2012-05-27 NOTE — Telephone Encounter (Signed)
appts made and printed for pt aom °

## 2012-05-27 NOTE — Progress Notes (Signed)
OFFICE PROGRESS NOTE  Interval history:  Judith Stephens returns as scheduled. She completed cycle 1 adjuvant Xeloda beginning 05/14/2012. She denies nausea/vomiting. No mouth sores. No diarrhea. She denies hand or foot pain or redness.   Objective: Blood pressure 151/85, pulse 69, temperature 97.8 F (36.6 C), temperature source Oral, resp. rate 20, height 5\' 11"  (1.803 m), weight 202 lb 14.4 oz (92.035 kg).  Oropharynx is without thrush or ulceration. Lungs are clear. Regular cardiac rhythm. Abdomen is soft and nontender. No hepatomegaly. Midline incision is healed. Extremities are without edema. Palms are nontender and without erythema.  Lab Results: Lab Results  Component Value Date   WBC 6.1 05/27/2012   HGB 11.7 05/27/2012   HCT 36.2 05/27/2012   MCV 80.4 05/27/2012   PLT 375 05/27/2012    Chemistry:    Chemistry      Component Value Date/Time   NA 141 03/17/2012 0450   K 3.7 03/17/2012 0450   CL 111 03/17/2012 0450   CO2 21 03/17/2012 0450   BUN 7 03/17/2012 0450   CREATININE 0.58 03/17/2012 0450      Component Value Date/Time   CALCIUM 9.0 03/17/2012 0450   ALKPHOS 55 03/12/2012 1150   AST 18 03/12/2012 1150   ALT 13 03/12/2012 1150   BILITOT 0.4 03/12/2012 1150       Studies/Results: No results found.  Medications: I have reviewed the patient's current medications.  Assessment/Plan:  1. 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 2. Severe microcytic anemia likely secondary to iron deficiency related to bleeding from the colon tumor. The hemoglobin and MCV have improved on oral iron. She will continue daily iron. 3. Diabetes. 4. Hypertension. 5. Hyperlipidemia.  Disposition-Judith Stephens appears stable. She has completed one cycle of adjuvant Xeloda chemotherapy. She will begin cycle 2 on 06/04/2012. She will return for a followup visit on 06/17/2012. She will contact the office in the interim with  any problems.  Plan reviewed with Dr. Truett Perna.  Judith Stephens ANP/GNP-BC

## 2012-05-30 NOTE — Telephone Encounter (Signed)
RECEIVED A FAX FROM [facility] CONCERNING A CONFIRMATION OF PRESCRIPTION SHIPMENT FOR XELODA ON 05/29/12.

## 2012-06-04 ENCOUNTER — Telehealth: Payer: Self-pay | Admitting: *Deleted

## 2012-06-04 NOTE — Telephone Encounter (Addendum)
Called to report she spilled "about 7 days worth" of her chemo pills on the floor, counter and down garbage disposal of the sink when the pill box came open. She does not want to take the ones that spilled on the floor and is willing to pay out of pocket to replace these. Her cycle started today. Instructed her to count how many pills she has on hand now so we can determine how to proceed. Told her it would be OK to take the ones that spilled on the counter and floor, but she does not wish to do so. Patient counted her pills and has #40 out of the #56 pills she needs for her 2nd week of treatment, which will put her 4 doses (2 days short). Will make MD aware to determine how to proceed.

## 2012-06-07 ENCOUNTER — Other Ambulatory Visit: Payer: Self-pay | Admitting: *Deleted

## 2012-06-07 ENCOUNTER — Other Ambulatory Visit: Payer: Self-pay | Admitting: Medical Oncology

## 2012-06-07 DIAGNOSIS — C189 Malignant neoplasm of colon, unspecified: Secondary | ICD-10-CM

## 2012-06-07 MED ORDER — CAPECITABINE 500 MG PO TABS: 2000.0000 mg | ORAL_TABLET | Freq: Two times a day (BID) | ORAL | Status: DC

## 2012-06-07 NOTE — Telephone Encounter (Signed)
Made patient aware that Dr. Truett Perna said OK to just miss two days of this cycle of chemo. Do not need to reorder 2 day supply.

## 2012-06-07 NOTE — Telephone Encounter (Signed)
THIS REFILL REQUEST FOR XELODA WAS GIVEN TO DR.SHERRILL'S NURSE, SUSAN COWARD,RN.

## 2012-06-07 NOTE — Telephone Encounter (Signed)
xeloda rx entered

## 2012-06-10 ENCOUNTER — Telehealth: Payer: Self-pay | Admitting: *Deleted

## 2012-06-10 NOTE — Telephone Encounter (Signed)
Day # 7 of Xeloda cycle and heels of feet are becoming tender. Not red or broken down. Using lotion and wearing appropriate foot wear. Will finish her cycle on 12/28 due to being 2 days short of medication.  Current dose is 2000 mg twice daily. Will review w/MD.

## 2012-06-11 ENCOUNTER — Telehealth: Payer: Self-pay | Admitting: *Deleted

## 2012-06-11 NOTE — Telephone Encounter (Signed)
Instructed pt to continue taking Xeloda as ordered per Dr Truett Perna and keep appointment on 12/30 for follow up. Told pt to call if she has any further problems

## 2012-06-11 NOTE — Telephone Encounter (Signed)
RECEIVED A FAX FROM [facility] CONCERNING A CONFIRMATION OF FACSIMILE RECEIPT FOR PT. REFERRAL. 

## 2012-06-17 ENCOUNTER — Other Ambulatory Visit (HOSPITAL_BASED_OUTPATIENT_CLINIC_OR_DEPARTMENT_OTHER): Payer: Medicare Other | Admitting: Lab

## 2012-06-17 ENCOUNTER — Ambulatory Visit (HOSPITAL_BASED_OUTPATIENT_CLINIC_OR_DEPARTMENT_OTHER): Payer: Medicare Other | Admitting: Oncology

## 2012-06-17 ENCOUNTER — Telehealth: Payer: Self-pay | Admitting: Oncology

## 2012-06-17 VITALS — BP 112/71 | HR 68 | Temp 98.1°F | Resp 20 | Ht 71.0 in | Wt 201.5 lb

## 2012-06-17 DIAGNOSIS — C182 Malignant neoplasm of ascending colon: Secondary | ICD-10-CM

## 2012-06-17 DIAGNOSIS — L270 Generalized skin eruption due to drugs and medicaments taken internally: Secondary | ICD-10-CM | POA: Diagnosis not present

## 2012-06-17 DIAGNOSIS — C189 Malignant neoplasm of colon, unspecified: Secondary | ICD-10-CM

## 2012-06-17 DIAGNOSIS — R197 Diarrhea, unspecified: Secondary | ICD-10-CM | POA: Diagnosis not present

## 2012-06-17 DIAGNOSIS — D649 Anemia, unspecified: Secondary | ICD-10-CM | POA: Diagnosis not present

## 2012-06-17 LAB — CBC WITH DIFFERENTIAL/PLATELET
BASO%: 0.7 % (ref 0.0–2.0)
EOS%: 2 % (ref 0.0–7.0)
HCT: 36.5 % (ref 34.8–46.6)
LYMPH%: 56 % — ABNORMAL HIGH (ref 14.0–49.7)
MCH: 28.4 pg (ref 25.1–34.0)
MCHC: 33.1 g/dL (ref 31.5–36.0)
MONO%: 9.3 % (ref 0.0–14.0)
NEUT%: 32 % — ABNORMAL LOW (ref 38.4–76.8)
lymph#: 2.7 10*3/uL (ref 0.9–3.3)

## 2012-06-17 MED ORDER — CAPECITABINE 500 MG PO TABS: ORAL_TABLET | ORAL | Status: DC

## 2012-06-17 NOTE — Telephone Encounter (Signed)
gv and printed appt schedule for pt for Jan 2014....the patient aware and ok

## 2012-06-17 NOTE — Progress Notes (Signed)
   [facility]    OFFICE PROGRESS NOTE   INTERVAL HISTORY:   Returns as scheduled. She began another cycle of Xeloda on 06/04/2012. She discontinued the Xeloda on 06/07/2012 when she developed foot soreness. She reports mild nausea on 06/07/2012 and she had 4 episodes of diarrhea yesterday. She took Imodium. No diarrhea today. She has soreness in the mouth but no discrete ulcers.  Objective:  Vital signs in last 24 hours:  Blood pressure 112/71, pulse 68, temperature 98.1 F (36.7 C), temperature source Oral, resp. rate 20, height 5\' 11"  (1.803 m), weight 201 lb 8 oz (91.4 kg).    HEENT: Hyperpigmentation at the buccal mucosa, no thrush or ulcers Resp: Lungs clear bilaterally Cardio: Regular rate and rhythm GI: No hepatomegaly, nontender, soft Vascular: No leg edema  Skin: Hyperpigmentation at the hands and feet. Mild erythema at the palms and soles. Mild dry desquamation at the soles. No discrete ulcers.    Lab Results:  Lab Results  Component Value Date   WBC 4.9 06/17/2012   HGB 12.1 06/17/2012   HCT 36.5 06/17/2012   MCV 85.8 06/17/2012   PLT 269 06/17/2012   ANC 1.6    Medications: I have reviewed the patient's current medications.  Assessment/Plan: 1. 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 She began adjuvant Xeloda on 05/14/2012 to 2. Severe microcytic anemia likely secondary to iron deficiency related to bleeding from the colon tumor. The hemoglobin and MCV have improved on oral iron. She will continue daily iron. 3. Diabetes. 4. Hypertension. 5. Hyperlipidemia. 6. Hand/foot syndrome secondary to Xeloda prompting the Xeloda to be discontinued after day 12, cycle 2. 7. Mild diarrhea secondary to Xeloda   Disposition:  She has developed significant hand/foot syndrome with cycle 2 of adjuvant Xeloda. The next cycle of Xeloda will be delayed until after an  office visit on 07/02/2011. The Xeloda will be dose reduced to 1500 mg in the morning and 1000 mg in the evening beginning with cycle 3. Ms. Lamia will contact us for diarrhea or increased hand/foot pain.   Thornton Papas, MD  06/17/2012  3:52 PM

## 2012-06-27 ENCOUNTER — Encounter: Payer: Self-pay | Admitting: *Deleted

## 2012-06-27 NOTE — Progress Notes (Signed)
RECEIVED A FAX FROM [employer] CONCERNING A CONFIRMATION OF PRESCRIPTION SHIPMENT FOR XELODA ON 06/26/12. 

## 2012-07-01 ENCOUNTER — Ambulatory Visit (HOSPITAL_BASED_OUTPATIENT_CLINIC_OR_DEPARTMENT_OTHER): Payer: Medicare Other | Admitting: Nurse Practitioner

## 2012-07-01 ENCOUNTER — Other Ambulatory Visit (HOSPITAL_BASED_OUTPATIENT_CLINIC_OR_DEPARTMENT_OTHER): Payer: Medicare Other | Admitting: Lab

## 2012-07-01 ENCOUNTER — Telehealth: Payer: Self-pay | Admitting: Oncology

## 2012-07-01 VITALS — BP 132/67 | HR 70 | Temp 98.4°F | Resp 20 | Ht 71.0 in | Wt 203.0 lb

## 2012-07-01 DIAGNOSIS — D509 Iron deficiency anemia, unspecified: Secondary | ICD-10-CM

## 2012-07-01 DIAGNOSIS — C189 Malignant neoplasm of colon, unspecified: Secondary | ICD-10-CM

## 2012-07-01 DIAGNOSIS — C182 Malignant neoplasm of ascending colon: Secondary | ICD-10-CM | POA: Diagnosis not present

## 2012-07-01 LAB — CBC WITH DIFFERENTIAL/PLATELET
BASO%: 0.9 % (ref 0.0–2.0)
EOS%: 2.6 % (ref 0.0–7.0)
HGB: 12.1 g/dL (ref 11.6–15.9)
MCH: 28.7 pg (ref 25.1–34.0)
MCHC: 33 g/dL (ref 31.5–36.0)
RBC: 4.22 10*6/uL (ref 3.70–5.45)
RDW: 27.4 % — ABNORMAL HIGH (ref 11.2–14.5)
lymph#: 2.3 10*3/uL (ref 0.9–3.3)

## 2012-07-01 LAB — COMPREHENSIVE METABOLIC PANEL (CC13)
ALT: 26 U/L (ref 0–55)
AST: 33 U/L (ref 5–34)
Albumin: 3.5 g/dL (ref 3.5–5.0)
Alkaline Phosphatase: 89 U/L (ref 40–150)
Calcium: 9.9 mg/dL (ref 8.4–10.4)
Chloride: 104 mEq/L (ref 98–107)
Potassium: 3.9 mEq/L (ref 3.5–5.1)
Sodium: 139 mEq/L (ref 136–145)

## 2012-07-01 NOTE — Progress Notes (Signed)
OFFICE PROGRESS NOTE  Interval history:  Judith Stephens returns as scheduled. She completed cycle 2 Xeloda beginning 06/04/2012. Cycle 3 was held due to to hand-foot syndrome.   She reports her feet are "less sore". The heels continue to be "sensitive". She denies hand pain. No nausea or vomiting over the past week. The diarrhea has resolved. No mouth sores. She continues to note skin darkening.   Objective: Blood pressure 132/67, pulse 70, temperature 98.4 F (36.9 C), temperature source Oral, resp. rate 20, height 5\' 11"  (1.803 m), weight 203 lb (92.08 kg).  Oropharynx is without thrush or ulceration. Lungs are clear. Regular cardiac rhythm. Abdomen is soft and nontender. No hepatomegaly. Extremities are without edema. Hyperpigmentation at the hands and feet. Mild erythema at the soles. Mild dry desquamation at the heels.  Lab Results: Lab Results  Component Value Date   WBC 5.1 07/01/2012   HGB 12.1 07/01/2012   HCT 36.7 07/01/2012   MCV 87.0 07/01/2012   PLT 238 07/01/2012    Chemistry:    Chemistry      Component Value Date/Time   NA 139 07/01/2012 1129   NA 141 03/17/2012 0450   K 3.9 07/01/2012 1129   K 3.7 03/17/2012 0450   CL 104 07/01/2012 1129   CL 111 03/17/2012 0450   CO2 28 07/01/2012 1129   CO2 21 03/17/2012 0450   BUN 21.0 07/01/2012 1129   BUN 7 03/17/2012 0450   CREATININE 1.0 07/01/2012 1129   CREATININE 0.58 03/17/2012 0450      Component Value Date/Time   CALCIUM 9.9 07/01/2012 1129   CALCIUM 9.0 03/17/2012 0450   ALKPHOS 89 07/01/2012 1129   ALKPHOS 55 03/12/2012 1150   AST 33 07/01/2012 1129   AST 18 03/12/2012 1150   ALT 26 07/01/2012 1129   ALT 13 03/12/2012 1150   BILITOT 0.56 07/01/2012 1129   BILITOT 0.4 03/12/2012 1150       Studies/Results: No results found.  Medications: I have reviewed the patient's current medications.  Assessment/Plan:  1. 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 She began adjuvant Xeloda on 05/14/2012. 2. Severe microcytic anemia likely secondary to iron deficiency related to bleeding from the colon tumor. The hemoglobin and MCV have improved on oral iron.  3. Diabetes. 4. Hypertension. 5. Hyperlipidemia. 6. Hand/foot syndrome secondary to Xeloda prompting the Xeloda to be discontinued after day 12, cycle 2. Improved. 7. Mild diarrhea secondary to Xeloda. Resolved.  Disposition-Ms. Seefeldt appears improved. The hand/foot symptoms are better. She will begin cycle 3 adjuvant Xeloda on 07/02/2012 at a reduced dose of 1500 mg in the morning and 1000 mg in the evening. She will return for a followup visit on 07/18/2012. She understands to contact the office in the interim with increased hand/foot symptoms and/or diarrhea.  Plan reviewed with Dr. Truett Perna.  Lonna Cobb ANP/GNP-BC

## 2012-07-01 NOTE — Telephone Encounter (Signed)
appts made and printed for pt aom °

## 2012-07-09 DIAGNOSIS — E119 Type 2 diabetes mellitus without complications: Secondary | ICD-10-CM | POA: Diagnosis not present

## 2012-07-09 DIAGNOSIS — E7800 Pure hypercholesterolemia, unspecified: Secondary | ICD-10-CM | POA: Diagnosis not present

## 2012-07-09 DIAGNOSIS — N951 Menopausal and female climacteric states: Secondary | ICD-10-CM | POA: Diagnosis not present

## 2012-07-09 DIAGNOSIS — I10 Essential (primary) hypertension: Secondary | ICD-10-CM | POA: Diagnosis not present

## 2012-07-09 DIAGNOSIS — Z0000 Encounter for general adult medical examination without abnormal findings: Secondary | ICD-10-CM | POA: Diagnosis not present

## 2012-07-09 DIAGNOSIS — E039 Hypothyroidism, unspecified: Secondary | ICD-10-CM | POA: Diagnosis not present

## 2012-07-17 ENCOUNTER — Other Ambulatory Visit: Payer: Medicare Other | Admitting: Lab

## 2012-07-18 ENCOUNTER — Telehealth: Payer: Self-pay | Admitting: Oncology

## 2012-07-18 ENCOUNTER — Ambulatory Visit (HOSPITAL_BASED_OUTPATIENT_CLINIC_OR_DEPARTMENT_OTHER): Payer: Medicare Other | Admitting: Nurse Practitioner

## 2012-07-18 ENCOUNTER — Other Ambulatory Visit: Payer: Medicare Other | Admitting: Lab

## 2012-07-18 ENCOUNTER — Ambulatory Visit (HOSPITAL_BASED_OUTPATIENT_CLINIC_OR_DEPARTMENT_OTHER): Payer: Medicare Other | Admitting: Lab

## 2012-07-18 VITALS — BP 142/78 | HR 98 | Temp 98.0°F | Resp 20 | Ht 71.0 in | Wt 204.4 lb

## 2012-07-18 DIAGNOSIS — C182 Malignant neoplasm of ascending colon: Secondary | ICD-10-CM | POA: Diagnosis not present

## 2012-07-18 DIAGNOSIS — L270 Generalized skin eruption due to drugs and medicaments taken internally: Secondary | ICD-10-CM

## 2012-07-18 DIAGNOSIS — C189 Malignant neoplasm of colon, unspecified: Secondary | ICD-10-CM

## 2012-07-18 DIAGNOSIS — D509 Iron deficiency anemia, unspecified: Secondary | ICD-10-CM | POA: Diagnosis not present

## 2012-07-18 LAB — CBC WITH DIFFERENTIAL/PLATELET
Basophils Absolute: 0 10*3/uL (ref 0.0–0.1)
EOS%: 1.1 % (ref 0.0–7.0)
HGB: 12.8 g/dL (ref 11.6–15.9)
LYMPH%: 44.1 % (ref 14.0–49.7)
MCH: 29.3 pg (ref 25.1–34.0)
MCV: 88.3 fL (ref 79.5–101.0)
MONO%: 7.1 % (ref 0.0–14.0)
NEUT%: 47.1 % (ref 38.4–76.8)
Platelets: 255 10*3/uL (ref 145–400)
RDW: 22.2 % — ABNORMAL HIGH (ref 11.2–14.5)

## 2012-07-18 LAB — COMPREHENSIVE METABOLIC PANEL (CC13)
AST: 31 U/L (ref 5–34)
Alkaline Phosphatase: 88 U/L (ref 40–150)
BUN: 17.7 mg/dL (ref 7.0–26.0)
Creatinine: 0.9 mg/dL (ref 0.6–1.1)
Potassium: 3.8 mEq/L (ref 3.5–5.1)
Total Bilirubin: 0.41 mg/dL (ref 0.20–1.20)

## 2012-07-18 NOTE — Progress Notes (Signed)
OFFICE PROGRESS NOTE  Interval history:  Judith Stephens returns as scheduled. She completed cycle 3 Xeloda beginning 07/02/2012. She denies nausea/vomiting. No mouth sores. No diarrhea. Hands are intermittently sore. Feet are sore with some areas of peeling.    Objective: Blood pressure 142/78, pulse 98, temperature 98 F (36.7 C), temperature source Oral, resp. rate 20, height 5\' 11"  (1.803 m), weight 204 lb 6.4 oz (92.715 kg).  Oropharynx is without thrush or ulceration. Lungs are clear. Regular cardiac rhythm. Abdomen is soft and nontender. No hepatomegaly. Extremities are without edema. Hyperpigmentation of the hands and feet. Mild erythema at the soles with mild dry desquamation at the heels. No skin breakdown at the hands.   Lab Results: Lab Results  Component Value Date   WBC 5.1 07/01/2012   HGB 12.1 07/01/2012   HCT 36.7 07/01/2012   MCV 87.0 07/01/2012   PLT 238 07/01/2012    Chemistry:    Chemistry      Component Value Date/Time   NA 139 07/01/2012 1129   NA 141 03/17/2012 0450   K 3.9 07/01/2012 1129   K 3.7 03/17/2012 0450   CL 104 07/01/2012 1129   CL 111 03/17/2012 0450   CO2 28 07/01/2012 1129   CO2 21 03/17/2012 0450   BUN 21.0 07/01/2012 1129   BUN 7 03/17/2012 0450   CREATININE 1.0 07/01/2012 1129   CREATININE 0.58 03/17/2012 0450      Component Value Date/Time   CALCIUM 9.9 07/01/2012 1129   CALCIUM 9.0 03/17/2012 0450   ALKPHOS 89 07/01/2012 1129   ALKPHOS 55 03/12/2012 1150   AST 33 07/01/2012 1129   AST 18 03/12/2012 1150   ALT 26 07/01/2012 1129   ALT 13 03/12/2012 1150   BILITOT 0.56 07/01/2012 1129   BILITOT 0.4 03/12/2012 1150       Studies/Results: No results found.  Medications: I have reviewed the patient's current medications.  Assessment/Plan:  1. 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 She began adjuvant Xeloda on 05/14/2012. 2. Severe microcytic anemia likely secondary  to iron deficiency related to bleeding from the colon tumor. The hemoglobin and MCV have improved on oral iron.  3. Diabetes. 4. Hypertension. 5. Hyperlipidemia. 6. Hand/foot syndrome secondary to Xeloda prompting the Xeloda to be discontinued after day 12, cycle 2. Improved. Xeloda was dose reduced beginning with cycle 3. 7. Mild diarrhea secondary to Xeloda. Resolved.  Disposition-Judith Stephens appears stable. She has completed 3 cycles of adjuvant Xeloda. If the current hand/foot symptoms are stable to improved she will begin cycle 4 as scheduled on 07/23/2012. If the symptoms are worse she will contact the office. She will return for a followup visit on 08/06/2012. She will contact the office in the interim as outlined above or with any other problems.  Plan reviewed with Dr. Truett Perna.  Judith Stephens ANP/GNP-BC

## 2012-07-18 NOTE — Telephone Encounter (Signed)
gv and printed appt schedule for Feb..I sent pt back to the lab

## 2012-07-22 ENCOUNTER — Telehealth: Payer: Self-pay | Admitting: *Deleted

## 2012-07-22 NOTE — Telephone Encounter (Signed)
Called pt, she expects Xeloda to be delivered today, pt verbalized accurate dosing instructions. She understands to call office if medication is not received.

## 2012-07-23 ENCOUNTER — Encounter: Payer: Self-pay | Admitting: *Deleted

## 2012-07-23 NOTE — Progress Notes (Signed)
RECEIVED A FAX FROM [employer] CONCERNING A CONFIRMATION OF PRESCRIPTION SHIPMENT FOR XELODA ON 07/19/12.

## 2012-08-05 ENCOUNTER — Other Ambulatory Visit: Payer: Self-pay | Admitting: *Deleted

## 2012-08-05 DIAGNOSIS — C189 Malignant neoplasm of colon, unspecified: Secondary | ICD-10-CM

## 2012-08-05 NOTE — Telephone Encounter (Signed)
THIS REFILL REQUEST FOR XELODA WAS GIVEN TO DR.SHERRILL'S NURSE, SUSAN COWARD,RN. 

## 2012-08-06 ENCOUNTER — Other Ambulatory Visit (HOSPITAL_BASED_OUTPATIENT_CLINIC_OR_DEPARTMENT_OTHER): Payer: Medicare Other | Admitting: Lab

## 2012-08-06 ENCOUNTER — Ambulatory Visit (HOSPITAL_BASED_OUTPATIENT_CLINIC_OR_DEPARTMENT_OTHER): Payer: Medicare Other | Admitting: Oncology

## 2012-08-06 ENCOUNTER — Telehealth: Payer: Self-pay | Admitting: Oncology

## 2012-08-06 ENCOUNTER — Other Ambulatory Visit: Payer: Self-pay | Admitting: *Deleted

## 2012-08-06 VITALS — BP 137/87 | HR 76 | Temp 97.4°F | Resp 20 | Ht 71.0 in | Wt 203.9 lb

## 2012-08-06 DIAGNOSIS — C182 Malignant neoplasm of ascending colon: Secondary | ICD-10-CM | POA: Diagnosis not present

## 2012-08-06 DIAGNOSIS — L270 Generalized skin eruption due to drugs and medicaments taken internally: Secondary | ICD-10-CM | POA: Diagnosis not present

## 2012-08-06 DIAGNOSIS — C189 Malignant neoplasm of colon, unspecified: Secondary | ICD-10-CM

## 2012-08-06 DIAGNOSIS — M79609 Pain in unspecified limb: Secondary | ICD-10-CM | POA: Diagnosis not present

## 2012-08-06 DIAGNOSIS — E119 Type 2 diabetes mellitus without complications: Secondary | ICD-10-CM

## 2012-08-06 LAB — CBC WITH DIFFERENTIAL/PLATELET
Basophils Absolute: 0 10*3/uL (ref 0.0–0.1)
EOS%: 1.7 % (ref 0.0–7.0)
Eosinophils Absolute: 0.1 10*3/uL (ref 0.0–0.5)
HCT: 37.9 % (ref 34.8–46.6)
HGB: 12.9 g/dL (ref 11.6–15.9)
MCH: 30.9 pg (ref 25.1–34.0)
MCV: 90.5 fL (ref 79.5–101.0)
MONO%: 9.3 % (ref 0.0–14.0)
NEUT%: 41.4 % (ref 38.4–76.8)
Platelets: 255 10*3/uL (ref 145–400)

## 2012-08-06 LAB — COMPREHENSIVE METABOLIC PANEL (CC13)
AST: 21 U/L (ref 5–34)
Alkaline Phosphatase: 76 U/L (ref 40–150)
BUN: 21.3 mg/dL (ref 7.0–26.0)
Calcium: 10.1 mg/dL (ref 8.4–10.4)
Creatinine: 0.9 mg/dL (ref 0.6–1.1)
Glucose: 111 mg/dl — ABNORMAL HIGH (ref 70–99)

## 2012-08-06 MED ORDER — CAPECITABINE 500 MG PO TABS: ORAL_TABLET | ORAL | Status: DC

## 2012-08-06 NOTE — Telephone Encounter (Signed)
gv and printed appt schedule for march.Marland Kitchen

## 2012-08-06 NOTE — Progress Notes (Signed)
   [facility]    OFFICE PROGRESS NOTE   INTERVAL HISTORY:   She returns as scheduled. She began cycle 4 of Xeloda on 07/23/2012. No mouth sores or diarrhea. The hyperpigmentation and tenderness at the hands has improved. She has discomfort over the toes and feet with ambulation.  She reports discomfort in the left calf for the past month. There has been similar pain in the past. The pain is worse with ambulation and is intermittent. No leg swelling or erythema. No pain in the left thigh.  Objective:  Vital signs in last 24 hours:  Blood pressure 137/87, pulse 76, temperature 97.4 F (36.3 C), resp. rate 20, height 5\' 11"  (1.803 m), weight 203 lb 14.4 oz (92.488 kg).    HEENT: No thrush or ulcers Resp: Lungs clear bilaterally Cardio: Regular rate and rhythm GI: No hepatomegaly, nontender, no mass Vascular: No leg edema. No tenderness at the left calf. No erythema  Skin: Hyperpigmentation and skin thickening at the hands with a few areas of superficial desquamation. Hyperpigmentation, thickening, calcium formation, and a few areas of superficial desquamation at the soles.   Lab Results:  Lab Results  Component Value Date   WBC 5.3 08/06/2012   HGB 12.9 08/06/2012   HCT 37.9 08/06/2012   MCV 90.5 08/06/2012   PLT 255 08/06/2012   ANC 2.2    Medications: I have reviewed the patient's current medications.  Assessment/Plan: 1. 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 She began adjuvant Xeloda on 05/14/2012. 2. Severe microcytic anemia likely secondary to iron deficiency related to bleeding from the colon tumor. The hemoglobin and MCV have improved on oral iron.  3. Diabetes. 4. Hypertension. 5. Hyperlipidemia. 6. Hand/foot syndrome secondary to Xeloda prompting the Xeloda to be discontinued after day 12, cycle 2. Improved. Xeloda was dose reduced beginning with cycle 3. 7. Mild  diarrhea secondary to Xeloda. Resolved.  Disposition:  She has completed 4 cycles of adjuvant Xeloda. The hand/foot symptoms improved with a dose reduction. The plan is to begin cycle 5 on 08/13/2012. She will contact us if the hand/foot symptoms worsen prior to cycle 5.  Ms. Engh will return for an office visit on 08/30/2012.  The left calf discomfort is likely related to a benign musculoskeletal condition. She will contact us for persistent pain or swelling in the left calf. I have a low clinical suspicion for a deep vein thrombosis today.   Thornton Papas, MD  08/06/2012  2:17 PM

## 2012-08-06 NOTE — Addendum Note (Signed)
Addended by: Arvilla Meres on: 08/06/2012 02:58 PM   Modules accepted: Orders

## 2012-08-09 ENCOUNTER — Other Ambulatory Visit: Payer: Medicare Other | Admitting: Lab

## 2012-08-12 ENCOUNTER — Telehealth: Payer: Self-pay | Admitting: *Deleted

## 2012-08-12 NOTE — Telephone Encounter (Addendum)
Call from pt, due to begin Xeloda Cycle 5 on 08/13/12. Reports feet are red and tender, began peeling last night. Asking if she should proceed with next cycle as scheduled? Next office visit 08/30/12. Reviewed with Dr. Truett Perna, HOLD XELODA for one week. Call office with update. Pt voiced understanding.

## 2012-08-12 NOTE — Telephone Encounter (Signed)
RECEIVED A FAX FROM [facility] CONCERNING A CONFIRMATION OF PRESCRIPTION SHIPMENT FOR XELODA ON 08/09/12. 

## 2012-08-20 ENCOUNTER — Telehealth: Payer: Self-pay | Admitting: *Deleted

## 2012-08-20 NOTE — Telephone Encounter (Signed)
Last cycle of Xeloda delayed until 08/20/12. Asking if 3/14 OV should be delayed?  Yes, per Lonna Cobb, NP. Scheduler notified.

## 2012-08-21 ENCOUNTER — Telehealth: Payer: Self-pay | Admitting: Oncology

## 2012-08-21 NOTE — Telephone Encounter (Signed)
s.w. pt and advised on d/t change of 3.14 appt to 3.21...Marland Kitchenpt ok and aware

## 2012-08-28 DIAGNOSIS — N951 Menopausal and female climacteric states: Secondary | ICD-10-CM | POA: Diagnosis not present

## 2012-08-30 ENCOUNTER — Ambulatory Visit: Payer: Medicare Other | Admitting: Nurse Practitioner

## 2012-08-30 ENCOUNTER — Other Ambulatory Visit: Payer: Medicare Other | Admitting: Lab

## 2012-09-04 NOTE — Telephone Encounter (Signed)
Rec'd fax confirmation that refill has been fulfilled/shipped on 09/03/12,  from Rx written on 08/05/12 for next day delivery

## 2012-09-06 ENCOUNTER — Ambulatory Visit (HOSPITAL_BASED_OUTPATIENT_CLINIC_OR_DEPARTMENT_OTHER): Payer: Medicare Other | Admitting: Nurse Practitioner

## 2012-09-06 ENCOUNTER — Telehealth: Payer: Self-pay | Admitting: Oncology

## 2012-09-06 ENCOUNTER — Other Ambulatory Visit (HOSPITAL_BASED_OUTPATIENT_CLINIC_OR_DEPARTMENT_OTHER): Payer: Medicare Other | Admitting: Lab

## 2012-09-06 VITALS — BP 140/90 | HR 75 | Temp 97.7°F | Resp 18 | Ht 71.0 in | Wt 202.4 lb

## 2012-09-06 DIAGNOSIS — C182 Malignant neoplasm of ascending colon: Secondary | ICD-10-CM | POA: Diagnosis not present

## 2012-09-06 DIAGNOSIS — C189 Malignant neoplasm of colon, unspecified: Secondary | ICD-10-CM

## 2012-09-06 DIAGNOSIS — L270 Generalized skin eruption due to drugs and medicaments taken internally: Secondary | ICD-10-CM | POA: Diagnosis not present

## 2012-09-06 DIAGNOSIS — D509 Iron deficiency anemia, unspecified: Secondary | ICD-10-CM

## 2012-09-06 LAB — CBC WITH DIFFERENTIAL/PLATELET
Basophils Absolute: 0 10*3/uL (ref 0.0–0.1)
Eosinophils Absolute: 0 10*3/uL (ref 0.0–0.5)
HCT: 37.2 % (ref 34.8–46.6)
HGB: 12.7 g/dL (ref 11.6–15.9)
LYMPH%: 42 % (ref 14.0–49.7)
MCV: 96.1 fL (ref 79.5–101.0)
MONO%: 7.9 % (ref 0.0–14.0)
NEUT#: 3.2 10*3/uL (ref 1.5–6.5)
NEUT%: 48.9 % (ref 38.4–76.8)
Platelets: 279 10*3/uL (ref 145–400)
RDW: 17.1 % — ABNORMAL HIGH (ref 11.2–14.5)

## 2012-09-06 NOTE — Patient Instructions (Signed)
Begin cycle 6 Xeloda on 09/10/12 as long as hands and feet continue to improve.

## 2012-09-06 NOTE — Progress Notes (Signed)
OFFICE PROGRESS NOTE  Interval history:  Ms. Yip returns as scheduled. She completed cycle 5 of Xeloda beginning 08/20/2012 (1 week delay due to foot redness and pain).  She notes that the feet and hand are mildly red. The right foot was tender a few days ago. No hand or foot pain today. She has occasional mild nausea. No mouth sores. No diarrhea.   Objective: Blood pressure 140/90, pulse 75, temperature 97.7 F (36.5 C), temperature source Oral, resp. rate 18, height 5\' 11"  (1.803 m), weight 202 lb 6.4 oz (91.808 kg).  Oropharynx is without thrush or ulceration. Lungs are clear. Regular cardiac rhythm. Abdomen soft and nontender. No hepatomegaly. Extremities are without edema. Palms and soles with mild erythema and skin thickening. Scattered areas of hyperpigmentation. Area of dry desquamation at the left heel. No ulcerations.  Lab Results: Lab Results  Component Value Date   WBC 6.5 09/06/2012   HGB 12.7 09/06/2012   HCT 37.2 09/06/2012   MCV 96.1 09/06/2012   PLT 279 09/06/2012    Chemistry:    Chemistry      Component Value Date/Time   NA 139 08/06/2012 0931   NA 141 03/17/2012 0450   K 3.6 08/06/2012 0931   K 3.7 03/17/2012 0450   CL 102 08/06/2012 0931   CL 111 03/17/2012 0450   CO2 27 08/06/2012 0931   CO2 21 03/17/2012 0450   BUN 21.3 08/06/2012 0931   BUN 7 03/17/2012 0450   CREATININE 0.9 08/06/2012 0931   CREATININE 0.58 03/17/2012 0450      Component Value Date/Time   CALCIUM 10.1 08/06/2012 0931   CALCIUM 9.0 03/17/2012 0450   ALKPHOS 76 08/06/2012 0931   ALKPHOS 55 03/12/2012 1150   AST 21 08/06/2012 0931   AST 18 03/12/2012 1150   ALT 23 08/06/2012 0931   ALT 13 03/12/2012 1150   BILITOT 0.50 08/06/2012 0931   BILITOT 0.4 03/12/2012 1150       Studies/Results: No results found.  Medications: I have reviewed the patient's current medications.  Assessment/Plan:  1. 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 She began adjuvant Xeloda on 05/14/2012. 2. Severe microcytic anemia likely secondary to iron deficiency related to bleeding from the colon tumor. The hemoglobin and MCV have improved on oral iron.  3. Diabetes. 4. Hypertension. 5. Hyperlipidemia. 6. Hand/foot syndrome secondary to Xeloda prompting the Xeloda to be discontinued after day 12, cycle 2. Improved. Xeloda was dose reduced beginning with cycle 3. 7. Mild diarrhea secondary to Xeloda. Resolved.  Disposition-Ms. Camacho appears stable. She has completed 5 cycles of adjuvant Xeloda. Plan to proceed with cycle 6 as scheduled on 09/10/2012. She will return for a followup visit in 3 weeks.   Lonna Cobb ANP/GNP-BC

## 2012-09-06 NOTE — Telephone Encounter (Signed)
Gave pt appt for lab and MD on April 2014 °

## 2012-09-13 ENCOUNTER — Encounter (INDEPENDENT_AMBULATORY_CARE_PROVIDER_SITE_OTHER): Payer: Self-pay

## 2012-09-23 ENCOUNTER — Other Ambulatory Visit: Payer: Self-pay | Admitting: *Deleted

## 2012-09-23 DIAGNOSIS — C189 Malignant neoplasm of colon, unspecified: Secondary | ICD-10-CM

## 2012-09-23 NOTE — Telephone Encounter (Addendum)
THIS REFILL REQUEST FOR XELODA WAS GIVEN TO DR.SHERRILL'S NURSE, SUSAN COWARD,RN. 

## 2012-09-24 MED ORDER — CAPECITABINE 500 MG PO TABS: ORAL_TABLET | ORAL | Status: DC

## 2012-09-24 NOTE — Addendum Note (Signed)
Addended by: Wandalee Ferdinand on: 09/24/2012 06:19 PM   Modules accepted: Orders

## 2012-09-25 MED ORDER — CAPECITABINE 500 MG PO TABS: ORAL_TABLET | ORAL | Status: DC

## 2012-09-25 NOTE — Telephone Encounter (Signed)
Rec'd confimation of referral with Xeloda.

## 2012-09-25 NOTE — Addendum Note (Signed)
Addended by: Laroy Apple E on: 09/25/2012 01:34 PM   Modules accepted: Orders

## 2012-09-26 ENCOUNTER — Encounter (INDEPENDENT_AMBULATORY_CARE_PROVIDER_SITE_OTHER): Payer: Self-pay | Admitting: General Surgery

## 2012-09-26 ENCOUNTER — Ambulatory Visit (INDEPENDENT_AMBULATORY_CARE_PROVIDER_SITE_OTHER): Payer: Medicare Other | Admitting: General Surgery

## 2012-09-26 VITALS — BP 130/82 | HR 60 | Temp 97.4°F | Resp 12 | Ht 70.5 in | Wt 205.0 lb

## 2012-09-26 DIAGNOSIS — K432 Incisional hernia without obstruction or gangrene: Secondary | ICD-10-CM

## 2012-09-26 DIAGNOSIS — C189 Malignant neoplasm of colon, unspecified: Secondary | ICD-10-CM | POA: Diagnosis not present

## 2012-09-26 NOTE — Telephone Encounter (Signed)
RECEIVED A FAX FROM [facility] CONCERNING A CONFIRMATION OF PRESCRIPTION SHIPMENT FOR XELODA ON 09/25/12.

## 2012-09-26 NOTE — Patient Instructions (Signed)
Your physical exam today is normal. There is no evidence of cancer.  Keep your regular appointments with Dr. Truett Perna. He will order blood work and x-ray tests from time to time.  You should schedule a colonoscopy with Dr. Dorena Cookey in September of this year.  Return to see Dr. Derrell Lolling in November of this year.

## 2012-09-26 NOTE — Progress Notes (Signed)
Patient ID: Judith Stephens, female   DOB: 1941-01-11, 72 y.o.   MRN: [id number] History: This patient underwent laparoscopic lysis of adhesions, extensive, right colectomy, and primary repair of ventral hernia on 03/14/2012. Final pathology showed a T4a., N0 tumor. 0/13 nodes positive. Preop CEA 1.0. She is being followed closely by Dr. Mancel Bale and is on Xeloda chemotherapy. Doing reasonably well with that. Appetite pretty good. Has 1 or 2 soft bowel movements per day. Denies abdominal pain or wound problems. She is scheduled to see Dr. Truett Perna tomorrow  ROS: 10 system review of systems negative except as described above. She does have fatigue. She does need some dental work because of her pain on the right side.  Exam: Patient looks well just a little tired. Husband is with her. Neck: No adenopathy or mass Lungs: Clear to auscultation bilaterally Heart: Regular rate and rhythm. No murmur. No ectopy Abdomen: Soft. Midline incision well healed. No hernia. No tenderness. No mass  Assessment: Adenocarcinoma of the hepatic flexure, final pathologic stage T4a., N0. Surgically, doing well 7 months postop right colectomy and ventral hernia repair. Preop anemia requiring transfusion. On iron therapy and followed by Dr. Truett Perna  Plan: Continue adjuvant chemotherapy with Dr. Truett Perna. I encouraged this. Repeat colonoscopy Dorena Cookey in September 2014 Return to see me in November 2014.   Angelia Mould. Derrell Lolling, M.D., [facility] General and Minimally invasive Surgery Breast and Colorectal Surgery Office:   [phone] Pager:   [phone]

## 2012-09-27 ENCOUNTER — Other Ambulatory Visit (HOSPITAL_BASED_OUTPATIENT_CLINIC_OR_DEPARTMENT_OTHER): Payer: Medicare Other | Admitting: Lab

## 2012-09-27 ENCOUNTER — Ambulatory Visit (HOSPITAL_BASED_OUTPATIENT_CLINIC_OR_DEPARTMENT_OTHER): Payer: Medicare Other | Admitting: Oncology

## 2012-09-27 ENCOUNTER — Telehealth: Payer: Self-pay | Admitting: Oncology

## 2012-09-27 VITALS — BP 142/88 | HR 74 | Temp 96.6°F | Resp 18 | Ht 70.5 in | Wt 205.9 lb

## 2012-09-27 DIAGNOSIS — C189 Malignant neoplasm of colon, unspecified: Secondary | ICD-10-CM

## 2012-09-27 DIAGNOSIS — C182 Malignant neoplasm of ascending colon: Secondary | ICD-10-CM | POA: Diagnosis not present

## 2012-09-27 DIAGNOSIS — D509 Iron deficiency anemia, unspecified: Secondary | ICD-10-CM

## 2012-09-27 DIAGNOSIS — L270 Generalized skin eruption due to drugs and medicaments taken internally: Secondary | ICD-10-CM

## 2012-09-27 DIAGNOSIS — E119 Type 2 diabetes mellitus without complications: Secondary | ICD-10-CM | POA: Diagnosis not present

## 2012-09-27 LAB — CBC WITH DIFFERENTIAL/PLATELET
Eosinophils Absolute: 0.1 10*3/uL (ref 0.0–0.5)
LYMPH%: 40.9 % (ref 14.0–49.7)
MCV: 98.2 fL (ref 79.5–101.0)
MONO%: 8.1 % (ref 0.0–14.0)
NEUT#: 2.7 10*3/uL (ref 1.5–6.5)
Platelets: 262 10*3/uL (ref 145–400)
RBC: 3.82 10*6/uL (ref 3.70–5.45)

## 2012-09-27 LAB — COMPREHENSIVE METABOLIC PANEL (CC13)
ALT: 18 U/L (ref 0–55)
BUN: 15 mg/dL (ref 7.0–26.0)
CO2: 25 mEq/L (ref 22–29)
Calcium: 9.7 mg/dL (ref 8.4–10.4)
Chloride: 103 mEq/L (ref 98–107)
Creatinine: 0.8 mg/dL (ref 0.6–1.1)
Glucose: 167 mg/dl — ABNORMAL HIGH (ref 70–99)
Total Bilirubin: 0.61 mg/dL (ref 0.20–1.20)

## 2012-09-27 NOTE — Progress Notes (Signed)
    [facility]    OFFICE PROGRESS NOTE   INTERVAL HISTORY:   She returns as scheduled. She began the most recent cycle of Xeloda on 09/10/2012. No mouth sores or diarrhea. Stable hand/foot hyperpigmentation. She reports discomfort at a right upper tooth with swelling of the cheek. She has seen a dentist and may need a "root canal ". She has tearing of the eyes when she is outside. She relates this to allergies.  Objective:  Vital signs in last 24 hours:  Blood pressure 142/88, pulse 74, temperature 96.6 F (35.9 C), temperature source Oral, resp. rate 18, height 5' 10.5" (1.791 m), weight 205 lb 14.4 oz (93.396 kg).    HEENT: No thrush or ulcers,? Slight fullness at the right upper lateral gumline Resp: Lungs clear bilaterally Cardio: Regular rate and rhythm GI: No hepatomegaly, nontender Vascular: No leg edema  Skin: Hyperpigmentation with mild erythema at the palms and soles. No skin breakdown.    Lab Results:  Lab Results  Component Value Date   WBC 5.6 09/27/2012   HGB 12.4 09/27/2012   HCT 37.6 09/27/2012   MCV 98.2 09/27/2012   PLT 262 09/27/2012   ANC 2.7  Medications: I have reviewed the patient's current medications.  Assessment/Plan: 1. 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 She began adjuvant Xeloda on 05/14/2012. 2. Severe microcytic anemia likely secondary to iron deficiency related to bleeding from the colon tumor. The hemoglobin and MCV have improved on oral iron.  3. Diabetes. 4. Hypertension. 5. Hyperlipidemia. 6. Hand/foot syndrome secondary to Xeloda prompting the Xeloda to be discontinued after day 12, cycle 2. Improved. Xeloda was dose reduced beginning with cycle 3. 7. Mild diarrhea secondary to Xeloda. Resolved. 8. Eye tearing-most likely related to seasonal allergies though this could be a manifestation of Xeloda toxicity.  Disposition:  Her  overall status appears stable. She will begin cycle 7 of adjuvant Xeloda on 10/01/2012. Ms. Delucchi will return for an office visit on 10/18/2012. She will contact us in the interim for new symptoms.  I recommended she followup with her dentist for evaluation of the tooth discomfort.   Thornton Papas, MD  09/27/2012  12:51 PM

## 2012-09-27 NOTE — Telephone Encounter (Signed)
gv and printed appt schedule and avs for pt for may...orders per chart...no pof

## 2012-10-02 ENCOUNTER — Telehealth: Payer: Self-pay | Admitting: *Deleted

## 2012-10-02 NOTE — Telephone Encounter (Signed)
Received message from asking "my dentist put me on Amoxicillin for infection, can I take with my chemo pill?"  Per Dr. Truett Perna, informed pt that MD said OK to take Xeloda while on amoxicillin.  Pt verbalized understanding and expressed appreciation for call back.

## 2012-10-07 ENCOUNTER — Telehealth: Payer: Self-pay | Admitting: *Deleted

## 2012-10-07 NOTE — Telephone Encounter (Signed)
Asking if OK to travel to [place]? Death in family. Per Dr. Truett Perna: OK to travel. Push po fluids, loose fitting clothes, support hose if you have them, stretch often. Use hand sanitizer and mask if next to someone who is coughing.

## 2012-10-16 ENCOUNTER — Other Ambulatory Visit: Payer: Self-pay

## 2012-10-16 DIAGNOSIS — Z1231 Encounter for screening mammogram for malignant neoplasm of breast: Secondary | ICD-10-CM

## 2012-10-18 ENCOUNTER — Other Ambulatory Visit (HOSPITAL_BASED_OUTPATIENT_CLINIC_OR_DEPARTMENT_OTHER): Payer: Medicare Other | Admitting: Lab

## 2012-10-18 ENCOUNTER — Telehealth: Payer: Self-pay | Admitting: Oncology

## 2012-10-18 ENCOUNTER — Ambulatory Visit (HOSPITAL_BASED_OUTPATIENT_CLINIC_OR_DEPARTMENT_OTHER): Payer: Medicare Other | Admitting: Nurse Practitioner

## 2012-10-18 VITALS — BP 133/82 | HR 74 | Temp 97.0°F | Resp 18 | Ht 70.5 in | Wt 204.3 lb

## 2012-10-18 DIAGNOSIS — D509 Iron deficiency anemia, unspecified: Secondary | ICD-10-CM

## 2012-10-18 DIAGNOSIS — L270 Generalized skin eruption due to drugs and medicaments taken internally: Secondary | ICD-10-CM

## 2012-10-18 DIAGNOSIS — E119 Type 2 diabetes mellitus without complications: Secondary | ICD-10-CM | POA: Diagnosis not present

## 2012-10-18 DIAGNOSIS — C182 Malignant neoplasm of ascending colon: Secondary | ICD-10-CM | POA: Diagnosis not present

## 2012-10-18 DIAGNOSIS — C189 Malignant neoplasm of colon, unspecified: Secondary | ICD-10-CM

## 2012-10-18 LAB — CEA: CEA: 1.2 ng/mL (ref 0.0–5.0)

## 2012-10-18 LAB — CBC WITH DIFFERENTIAL/PLATELET
BASO%: 0.7 % (ref 0.0–2.0)
Eosinophils Absolute: 0.1 10*3/uL (ref 0.0–0.5)
HCT: 39.6 % (ref 34.8–46.6)
LYMPH%: 41.9 % (ref 14.0–49.7)
MCHC: 33.5 g/dL (ref 31.5–36.0)
MONO#: 0.5 10*3/uL (ref 0.1–0.9)
NEUT#: 2.6 10*3/uL (ref 1.5–6.5)
NEUT%: 45.8 % (ref 38.4–76.8)
Platelets: 255 10*3/uL (ref 145–400)
WBC: 5.7 10*3/uL (ref 3.9–10.3)
lymph#: 2.4 10*3/uL (ref 0.9–3.3)

## 2012-10-18 NOTE — Telephone Encounter (Signed)
gv and printed appt sced and avs for June...pt prefered later appt so r/s to 6.30

## 2012-10-18 NOTE — Progress Notes (Signed)
OFFICE PROGRESS NOTE  Interval history:  Judith Stephens returns as scheduled. She completed cycle 7 adjuvant Xeloda beginning 10/01/2012. She denies nausea/vomiting. No mouth sores. No diarrhea. Her feet are mildly red and tender. No skin breakdown.  She had a root canal on 10/02/2012. She notes swelling in the region of the right cheek. She denies pain.   Objective: Blood pressure 133/82, pulse 74, temperature 97 F (36.1 C), temperature source Oral, resp. rate 18, height 5' 10.5" (1.791 m), weight 204 lb 4.8 oz (92.67 kg).  No thrush or ulceration. Lungs are clear. Regular cardiac rhythm. Abdomen is soft and nontender. No hepatomegaly. Extremities are without edema. Mild erythema at the soles. No skin breakdown. Palms without erythema. Hyperpigmentation at the palms and soles. Mild edema at the right malar region.  Lab Results: Lab Results  Component Value Date   WBC 5.7 10/18/2012   HGB 13.3 10/18/2012   HCT 39.6 10/18/2012   MCV 98.4 10/18/2012   PLT 255 10/18/2012    Chemistry:    Chemistry      Component Value Date/Time   NA 138 09/27/2012 1117   NA 141 03/17/2012 0450   K 4.0 09/27/2012 1117   K 3.7 03/17/2012 0450   CL 103 09/27/2012 1117   CL 111 03/17/2012 0450   CO2 25 09/27/2012 1117   CO2 21 03/17/2012 0450   BUN 15.0 09/27/2012 1117   BUN 7 03/17/2012 0450   CREATININE 0.8 09/27/2012 1117   CREATININE 0.58 03/17/2012 0450      Component Value Date/Time   CALCIUM 9.7 09/27/2012 1117   CALCIUM 9.0 03/17/2012 0450   ALKPHOS 56 09/27/2012 1117   ALKPHOS 55 03/12/2012 1150   AST 23 09/27/2012 1117   AST 18 03/12/2012 1150   ALT 18 09/27/2012 1117   ALT 13 03/12/2012 1150   BILITOT 0.61 09/27/2012 1117   BILITOT 0.4 03/12/2012 1150       Studies/Results: No results found.  Medications: I have reviewed the patient's current medications.  Assessment/Plan:  1. 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  She began adjuvant Xeloda on 05/14/2012. 2. Severe microcytic anemia likely secondary to iron deficiency related to bleeding from the colon tumor. The hemoglobin and MCV have improved on oral iron.  3. Diabetes. 4. Hypertension. 5. Hyperlipidemia. 6. Hand/foot syndrome secondary to Xeloda prompting the Xeloda to be discontinued after day 12, cycle 2. Improved. Xeloda was dose reduced beginning with cycle 3. 7. Mild diarrhea secondary to Xeloda. Resolved. 8. Eye tearing-most likely related to seasonal allergies though this could be a manifestation of Xeloda toxicity. She did not complain of eye tearing at today's visit. 9. Status post root canal 10/02/2012.  Disposition-Judith Stephens appears stable. She has completed 7 cycles of adjuvant Xeloda chemotherapy. She will proceed with the eighth and final cycle as scheduled on 10/22/2012. She will return for a followup visit in approximately 8 weeks. She will contact the office in the interim with any problems.  Plan reviewed with Dr. Truett Perna.  Judith Stephens ANP/GNP-BC

## 2012-10-21 ENCOUNTER — Encounter: Payer: Self-pay | Admitting: *Deleted

## 2012-10-21 NOTE — Progress Notes (Signed)
RECEIVED A FAX FROM [employer] CONCERNING A CONFIRMATION OF PRESCRIPTION SHIPMENT FOR XELODA ON 10/18/12. 

## 2012-10-31 ENCOUNTER — Ambulatory Visit
Admission: RE | Admit: 2012-10-31 | Discharge: 2012-10-31 | Disposition: A | Discharge status: Home / Self Care | Payer: Medicare Other | Source: Ambulatory Visit

## 2012-10-31 DIAGNOSIS — Z1231 Encounter for screening mammogram for malignant neoplasm of breast: Secondary | ICD-10-CM

## 2012-11-01 ENCOUNTER — Telehealth: Payer: Self-pay | Admitting: *Deleted

## 2012-11-01 NOTE — Telephone Encounter (Signed)
Left VM to go to ER if she is having acute chest pain or shortness of breath. Call MD on call for fever. This is not side effect of her chemotherapy. May have strained muscle or slept wrong. Suggest Tylenol or Ibuprofen. If not better let us know Monday to reassess.

## 2012-11-01 NOTE — Telephone Encounter (Signed)
Left VM that she was "sore all over my chest and back". Requested return call on her mobile #.

## 2012-11-04 ENCOUNTER — Other Ambulatory Visit: Payer: Self-pay | Admitting: *Deleted

## 2012-11-04 ENCOUNTER — Telehealth: Payer: Self-pay | Admitting: *Deleted

## 2012-11-04 NOTE — Telephone Encounter (Signed)
THIS REFILL REQUEST FOR XELODA WAS GIVEN TO DR.SHERRILL'S NURSE, SUSAN COWARD,RN. 

## 2012-11-04 NOTE — Telephone Encounter (Signed)
Reports she is still "sore" in her chest and back since her call last week. Reports she was in a lot of pain Friday evening. On call MD told her Saturday to stop taking her Xeloda and call Dr. Truett Perna today. She denies any dyspnea or fever or rash or swelling. Does not recall any strenuous activity or lifting. Reports that the ache/soreness is in in her chest and across shoulders, but is not as severe as it was last week. Has been taking Tylenol. Wanted MD aware and asking if she should still hold her Xeloda? Still had #3 day supply on hand.

## 2012-11-05 NOTE — Telephone Encounter (Signed)
Per Dr. Truett Perna : Doubt her pain is related to chemo or her cancer. May stop last cycle of Xeloda if she desires. Called patient with instructions-her pain is better today and she took her Xeloda today. She wants to finish out the cycle. Instructed her to call her PCP if the pain returns and she can't get comfortable.

## 2012-11-29 DIAGNOSIS — M79609 Pain in unspecified limb: Secondary | ICD-10-CM | POA: Diagnosis not present

## 2012-11-29 DIAGNOSIS — B351 Tinea unguium: Secondary | ICD-10-CM | POA: Diagnosis not present

## 2012-12-13 ENCOUNTER — Ambulatory Visit: Payer: Medicare Other | Admitting: Oncology

## 2012-12-13 ENCOUNTER — Other Ambulatory Visit: Payer: Medicare Other | Admitting: Lab

## 2012-12-16 ENCOUNTER — Ambulatory Visit (HOSPITAL_BASED_OUTPATIENT_CLINIC_OR_DEPARTMENT_OTHER): Payer: Medicare Other | Admitting: Oncology

## 2012-12-16 ENCOUNTER — Telehealth: Payer: Self-pay | Admitting: *Deleted

## 2012-12-16 ENCOUNTER — Telehealth: Payer: Self-pay | Admitting: Oncology

## 2012-12-16 ENCOUNTER — Other Ambulatory Visit (HOSPITAL_BASED_OUTPATIENT_CLINIC_OR_DEPARTMENT_OTHER): Payer: Medicare Other | Admitting: Lab

## 2012-12-16 VITALS — BP 153/92 | HR 70 | Temp 98.2°F | Resp 20 | Ht 70.5 in | Wt 206.7 lb

## 2012-12-16 DIAGNOSIS — C189 Malignant neoplasm of colon, unspecified: Secondary | ICD-10-CM | POA: Diagnosis not present

## 2012-12-16 LAB — CBC WITH DIFFERENTIAL/PLATELET
BASO%: 0.7 % (ref 0.0–2.0)
EOS%: 1.1 % (ref 0.0–7.0)
HCT: 38.9 % (ref 34.8–46.6)
HGB: 13.3 g/dL (ref 11.6–15.9)
LYMPH%: 48.2 % (ref 14.0–49.7)
MCH: 32.8 pg (ref 25.1–34.0)
MCHC: 34.2 g/dL (ref 31.5–36.0)
MCV: 95.9 fL (ref 79.5–101.0)
MONO#: 0.5 10*3/uL (ref 0.1–0.9)
MONO%: 9.2 % (ref 0.0–14.0)
NEUT#: 2.1 10*3/uL (ref 1.5–6.5)
RBC: 4.05 10*6/uL (ref 3.70–5.45)

## 2012-12-16 NOTE — Progress Notes (Signed)
    [facility]    OFFICE PROGRESS NOTE   INTERVAL HISTORY:   She returns as scheduled. She completed the final cycle of Xeloda beginning on 10/22/2012. Ms. spell has noted improvement in the skin thickening/hyperpigmentation over the hands and feet. She continues to have discomfort in the left calf with ambulation. No leg swelling. The discomfort predating chemotherapy.  Objective:  Vital signs in last 24 hours:  Blood pressure 153/92, pulse 70, temperature 98.2 F (36.8 C), temperature source Oral, resp. rate 20, height 5' 10.5" (1.791 m), weight 206 lb 11.2 oz (93.759 kg).    HEENT: Neck without mass Lymphatics: No cervical, supraclavicular, axillary, or inguinal nodes Resp: Lungs clear bilaterally Cardio: Regular rate and rhythm GI: No hepatomegaly, nontender Vascular: No leg edema , examination of the left calf is unremarkable Skin: Mild hyperpigmentation and skin thickening over the hands and feet    Lab Results:  Lab Results  Component Value Date   WBC 5.1 12/16/2012   HGB 13.3 12/16/2012   HCT 38.9 12/16/2012   MCV 95.9 12/16/2012   PLT 258 12/16/2012   ANC 2.1  CEA on 10/18/2012-1.2  Medications: I have reviewed the patient's current medications.  Assessment/Plan: 1. 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 She began adjuvant Xeloda on 05/14/2012. Final cycle of Xeloda started on 10/22/2012. 2. Severe microcytic anemia likely secondary to iron deficiency related to bleeding from the colon tumor. The hemoglobin and MCV have improved on oral iron.  3. Diabetes. 4. Hypertension. 5. Hyperlipidemia. 6. Hand/foot syndrome secondary to Xeloda prompting the Xeloda to be discontinued after day 12, cycle 2. Improved. Xeloda was dose reduced beginning with cycle 3. 7. Mild diarrhea secondary to Xeloda. Resolved. 8. Eye tearing-most likely related to seasonal allergies though  this could be a manifestation of Xeloda toxicity. She did not complain of eye tearing at today's visit. 9. Status post root canal 10/02/2012. 10. Discomfort at the left calf-chronic, low clinical suspicion for a DVT. I doubt the pain is related to the colon cancer diagnosis the   Disposition:  Ms. Lichtman has completed adjuvant therapy. She will return for an office visit and CEA in November of this year. We will refer her to Dr. Madilyn Fireman for a surveillance colonoscopy. I asked her to followup with Dr. Clovis Riley for evaluation of the left leg discomfort. We will ask her to discontinue iron.   Thornton Papas, MD  12/16/2012  12:01 PM

## 2012-12-16 NOTE — Telephone Encounter (Signed)
, °

## 2012-12-26 DIAGNOSIS — M79609 Pain in unspecified limb: Secondary | ICD-10-CM | POA: Diagnosis not present

## 2012-12-26 DIAGNOSIS — E039 Hypothyroidism, unspecified: Secondary | ICD-10-CM | POA: Diagnosis not present

## 2012-12-26 DIAGNOSIS — E119 Type 2 diabetes mellitus without complications: Secondary | ICD-10-CM | POA: Diagnosis not present

## 2012-12-26 DIAGNOSIS — J309 Allergic rhinitis, unspecified: Secondary | ICD-10-CM | POA: Diagnosis not present

## 2012-12-26 DIAGNOSIS — I10 Essential (primary) hypertension: Secondary | ICD-10-CM | POA: Diagnosis not present

## 2012-12-26 DIAGNOSIS — M6740 Ganglion, unspecified site: Secondary | ICD-10-CM | POA: Diagnosis not present

## 2012-12-26 DIAGNOSIS — E7800 Pure hypercholesterolemia, unspecified: Secondary | ICD-10-CM | POA: Diagnosis not present

## 2013-03-11 DIAGNOSIS — E119 Type 2 diabetes mellitus without complications: Secondary | ICD-10-CM | POA: Diagnosis not present

## 2013-03-11 DIAGNOSIS — H40019 Open angle with borderline findings, low risk, unspecified eye: Secondary | ICD-10-CM | POA: Diagnosis not present

## 2013-03-11 DIAGNOSIS — H35039 Hypertensive retinopathy, unspecified eye: Secondary | ICD-10-CM | POA: Diagnosis not present

## 2013-03-11 DIAGNOSIS — H2510 Age-related nuclear cataract, unspecified eye: Secondary | ICD-10-CM | POA: Diagnosis not present

## 2013-03-11 DIAGNOSIS — H43819 Vitreous degeneration, unspecified eye: Secondary | ICD-10-CM | POA: Diagnosis not present

## 2013-03-13 ENCOUNTER — Other Ambulatory Visit: Payer: Self-pay | Admitting: Gastroenterology

## 2013-03-13 DIAGNOSIS — Z85038 Personal history of other malignant neoplasm of large intestine: Secondary | ICD-10-CM | POA: Diagnosis not present

## 2013-03-13 DIAGNOSIS — D126 Benign neoplasm of colon, unspecified: Secondary | ICD-10-CM | POA: Diagnosis not present

## 2013-03-13 DIAGNOSIS — Z09 Encounter for follow-up examination after completed treatment for conditions other than malignant neoplasm: Secondary | ICD-10-CM | POA: Diagnosis not present

## 2013-03-13 DIAGNOSIS — Z980 Intestinal bypass and anastomosis status: Secondary | ICD-10-CM | POA: Diagnosis not present

## 2013-03-21 DIAGNOSIS — Z23 Encounter for immunization: Secondary | ICD-10-CM | POA: Diagnosis not present

## 2013-04-22 ENCOUNTER — Ambulatory Visit (HOSPITAL_BASED_OUTPATIENT_CLINIC_OR_DEPARTMENT_OTHER): Payer: Medicare Other | Admitting: Nurse Practitioner

## 2013-04-22 ENCOUNTER — Other Ambulatory Visit: Payer: Medicare Other | Admitting: Lab

## 2013-04-22 VITALS — BP 144/85 | HR 73 | Temp 97.2°F | Resp 18 | Ht 70.5 in | Wt 212.2 lb

## 2013-04-22 DIAGNOSIS — I10 Essential (primary) hypertension: Secondary | ICD-10-CM

## 2013-04-22 DIAGNOSIS — C189 Malignant neoplasm of colon, unspecified: Secondary | ICD-10-CM | POA: Diagnosis not present

## 2013-04-22 DIAGNOSIS — D509 Iron deficiency anemia, unspecified: Secondary | ICD-10-CM | POA: Diagnosis not present

## 2013-04-22 DIAGNOSIS — C182 Malignant neoplasm of ascending colon: Secondary | ICD-10-CM | POA: Diagnosis not present

## 2013-04-22 DIAGNOSIS — E119 Type 2 diabetes mellitus without complications: Secondary | ICD-10-CM | POA: Diagnosis not present

## 2013-04-22 NOTE — Progress Notes (Signed)
OFFICE PROGRESS NOTE  Interval history:  Judith Stephens returns for followup of colon cancer. She she reports undergoing a colonoscopy in September of this year. We do not have the colonoscopy report. Pathology on a transverse colon polyp 03/13/2013 showed a tubular adenoma; negative for high-grade dysplasia or malignancy.  She overall feels well. She notes improvement in her appetite. No nausea or vomiting. Bowels moving regularly. No bloody or black stools. She denies abdominal pain. She has intermittent bilateral knee pain which she attributes to arthritis. She denies shortness breath. She has an occasional cough. She thinks the cough is related to "nasal drip". She has intermittent allergy symptoms. The skin hyperpigmentation has nearly resolved.   Objective: Blood pressure 144/85, pulse 73, temperature 97.2 F (36.2 C), temperature source Oral, resp. rate 18, height 5' 10.5" (1.791 m), weight 212 lb 3.2 oz (96.253 kg).  Oropharynx is without thrush or ulceration. No palpable cervical, supraclavicular, axillary or inguinal lymph nodes. Lungs are clear. Regular cardiac rhythm. Abdomen is soft and nontender. No organomegaly. Extremities are without edema. Calves are nontender. No skin rash.  Lab Results: Lab Results  Component Value Date   WBC 5.1 12/16/2012   HGB 13.3 12/16/2012   HCT 38.9 12/16/2012   MCV 95.9 12/16/2012   PLT 258 12/16/2012    Chemistry:    Chemistry      Component Value Date/Time   NA 138 09/27/2012 1117   NA 141 03/17/2012 0450   K 4.0 09/27/2012 1117   K 3.7 03/17/2012 0450   CL 103 09/27/2012 1117   CL 111 03/17/2012 0450   CO2 25 09/27/2012 1117   CO2 21 03/17/2012 0450   BUN 15.0 09/27/2012 1117   BUN 7 03/17/2012 0450   CREATININE 0.8 09/27/2012 1117   CREATININE 0.58 03/17/2012 0450      Component Value Date/Time   CALCIUM 9.7 09/27/2012 1117   CALCIUM 9.0 03/17/2012 0450   ALKPHOS 56 09/27/2012 1117   ALKPHOS 55 03/12/2012 1150   AST 23 09/27/2012 1117   AST 18  03/12/2012 1150   ALT 18 09/27/2012 1117   ALT 13 03/12/2012 1150   BILITOT 0.61 09/27/2012 1117   BILITOT 0.4 03/12/2012 1150       Studies/Results: No results found.  Medications: I have reviewed the patient's current medications.  Assessment/Plan:  1. 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 She began adjuvant Xeloda on 05/14/2012. Final cycle of Xeloda started on 10/22/2012. 2. Severe microcytic anemia likely secondary to iron deficiency related to bleeding from the colon tumor. The hemoglobin and MCV have improved on oral iron. Oral iron was discontinued following office visit 12/16/2012. 3. Diabetes. 4. Hypertension. 5. Hyperlipidemia. 6. History of hand/foot syndrome secondary to Xeloda prompting the Xeloda to be discontinued after day 12, cycle 2. Improved. Xeloda was dose reduced beginning with cycle 3. 7. History of mild diarrhea secondary to Xeloda.  8. Eye tearing-most likely related to seasonal allergies though this could be a manifestation of Xeloda toxicity. She did not complain of eye tearing at today's visit. 9. Status post root canal 10/02/2012. 10. Discomfort at the left calf-chronic, low clinical suspicion for a DVT. She did not complain of left calf pain at today's visit. 11. Colonoscopy 03/13/2013 by Dr. Madilyn Fireman. Pathology on  transverse colon polyp showed a tubular adenoma.  Disposition-[name] appears stable. She remains in clinical remission from colon cancer. We will followup on the CEA from today.  She will return for a followup visit and CEA in 6 months. She will contact the office in the interim with any problems.  Plan reviewed with Dr. Truett Perna.  Judith Stephens ANP/GNP-BC

## 2013-04-23 ENCOUNTER — Telehealth: Payer: Self-pay | Admitting: Oncology

## 2013-04-23 NOTE — Telephone Encounter (Signed)
S/W THE PT AND SHE IS AWARE OF HER MAY 2015 APPTS

## 2013-04-24 ENCOUNTER — Telehealth: Payer: Self-pay | Admitting: *Deleted

## 2013-04-24 NOTE — Telephone Encounter (Signed)
Called pt with normal CEA results. She voiced appreciation for call. 

## 2013-04-24 NOTE — Telephone Encounter (Signed)
Message copied by Caleb Popp on Thu Apr 24, 2013  9:50 AM ------      Message from: Thornton Papas B      Created: Wed Apr 23, 2013  7:42 PM       Please call patient, cea is normal ------

## 2013-04-29 ENCOUNTER — Encounter (INDEPENDENT_AMBULATORY_CARE_PROVIDER_SITE_OTHER): Payer: Self-pay | Admitting: General Surgery

## 2013-04-29 ENCOUNTER — Ambulatory Visit (INDEPENDENT_AMBULATORY_CARE_PROVIDER_SITE_OTHER): Payer: Medicare Other | Admitting: General Surgery

## 2013-04-29 VITALS — BP 130/74 | HR 68 | Temp 97.2°F | Resp 14 | Ht 71.0 in | Wt 209.4 lb

## 2013-04-29 DIAGNOSIS — C189 Malignant neoplasm of colon, unspecified: Secondary | ICD-10-CM | POA: Diagnosis not present

## 2013-04-29 NOTE — Patient Instructions (Signed)
Your physical exam today is normal. There is no evidence of cancer. Your abdominal incision is well-healed. There is no evidence of hernia.  Your colonoscopy in September was basically normal, no evidence of cancer.  Keep your regular appointment with Dr. Truett Perna every 6 months. He will perform all necessary tests.  Repeat colonoscopy in 3 years  Return to see Dr. Derrell Lolling as needed.

## 2013-04-29 NOTE — Progress Notes (Signed)
Patient ID: Judith Stephens, female   DOB: 05-Feb-1941, 72 y.o.   MRN: [id number]  Chief Complaint  Patient presents with  . Follow-up    LTFU 7 mo colon chk    HPI Judith Stephens is a 72 y.o. female.  She returns for a one year followup following her colon cancer resection.   This patient underwent laparoscopic lysis of adhesions, extensive, right colectomy, and primary repair of ventral hernia on 03/14/2012. Final pathology showed a T4a., N0 tumor. 0/13 nodes positive. Preop CEA 1.0.  She is being followed closely by Dr. Mancel Bale and has been on Xeloda chemotherapy, That has now been discontinued due to side effects. She has been slowly gaining weight. Appetite good. Has 1 or 2 soft bowel movements per day. Denies abdominal pain or wound problems.  She had a colonoscopy by Dr. Dorena Cookey on 03/13/2013. She had one benign polyp. Followup in 3 years is planned. She saw Dr. Truett Perna 04/22/2013. CEA was 0.9. She is felt to be cancer free. Six-month followup is planned  HPI  Past Medical History  Diagnosis Date  . Hypertension   . Diabetes mellitus   . Hyperlipidemia   . Blood transfusion   . Arthritis   . Hypothyroidism   . Anemia     hx of blood transfusion 03/06/12     Past Surgical History  Procedure Laterality Date  . Oophorectomy    . Tubal ligation    . Laparoscopy  03/14/2012    Procedure: LAPAROSCOPY DIAGNOSTIC;  Surgeon: Ernestene Mention, MD;  Location: [facility];  Service: General;;  right colectomy    Family History  Problem Relation Age of Onset  . Hypertension Father   . Hypertension Sister     x3  . Hypertension Brother     Social History History  Substance Use Topics  . Smoking status: Former Games developer  . Smokeless tobacco: Never Used  . Alcohol Use: Yes     Comment: occasional wine     No Known Allergies  Current Outpatient Prescriptions  Medication Sig Dispense Refill  . amLODipine (NORVASC) 10 MG tablet Take 5 mg by mouth every morning.       Marland Kitchen aspirin EC  81 MG tablet Take 81 mg by mouth every morning.      . enalapril-hydrochlorothiazide (VASERETIC) 10-25 MG per tablet Take 1 tablet by mouth every morning.       . fluticasone (FLONASE) 50 MCG/ACT nasal spray Place 2 sprays into the nose daily as needed. For allergies      . GLIPIZIDE XL 10 MG 24 hr tablet Take 10 mg by mouth every morning.       Marland Kitchen levothyroxine (SYNTHROID, LEVOTHROID) 137 MCG tablet Take 137 mcg by mouth daily before breakfast.       . loratadine (CLARITIN) 10 MG tablet Take 10 mg by mouth daily. As needed for allergies      . pravastatin (PRAVACHOL) 40 MG tablet Take 40 mg by mouth every morning.       . prochlorperazine (COMPAZINE) 10 MG tablet Take 0.5-1 tablets (5-10 mg total) by mouth every 6 (six) hours as needed. For nausea.  30 tablet  2   No current facility-administered medications for this visit.    Review of Systems Review of Systems  Constitutional: Positive for unexpected weight change. Negative for fever and chills.  HENT: Negative.  Negative for congestion, hearing loss, sore throat, trouble swallowing and voice change.   Eyes: Negative.  Negative for visual  disturbance.  Respiratory: Negative.  Negative for cough and wheezing.   Cardiovascular: Negative.  Negative for chest pain, palpitations and leg swelling.  Gastrointestinal: Negative.  Negative for nausea, vomiting, abdominal pain, diarrhea, constipation, blood in stool, abdominal distention and anal bleeding.  Genitourinary: Negative.  Negative for hematuria, vaginal bleeding and difficulty urinating.  Musculoskeletal: Negative for arthralgias.  Skin: Negative for rash and wound.  Neurological: Negative.  Negative for seizures, syncope and headaches.  Hematological: Negative for adenopathy. Does not bruise/bleed easily.  Psychiatric/Behavioral: Negative.  Negative for confusion.    Blood pressure 130/74, pulse 68, temperature 97.2 F (36.2 C), temperature source Temporal, resp. rate 14, height 5'  11" (1.803 m), weight 209 lb 6.4 oz (94.983 kg).  Physical Exam Physical Exam  Constitutional: She is oriented to person, place, and time. She appears well-developed and well-nourished. No distress.  HENT:  Head: Normocephalic and atraumatic.  Nose: Nose normal.  Mouth/Throat: No oropharyngeal exudate.  Eyes: Conjunctivae and EOM are normal. Pupils are equal, round, and reactive to light. Left eye exhibits no discharge. No scleral icterus.  Neck: Neck supple. No JVD present. No tracheal deviation present. No thyromegaly present.  Cardiovascular: Normal rate, regular rhythm, normal heart sounds and intact distal pulses.   No murmur heard. Pulmonary/Chest: Effort normal and breath sounds normal. No respiratory distress. She has no wheezes. She has no rales. She exhibits no tenderness.  Abdominal: Soft. Bowel sounds are normal. She exhibits no distension and no mass. There is no tenderness. There is no rebound and no guarding.  Midline incision well healed. No hernia. No mass. No tenderness. Liver and spleen not enlarged.  Musculoskeletal: She exhibits no edema and no tenderness.  Lymphadenopathy:    She has no cervical adenopathy.  Neurological: She is alert and oriented to person, place, and time. She exhibits normal muscle tone. Coordination normal.  Skin: Skin is warm. No rash noted. She is not diaphoretic. No erythema. No pallor.  Psychiatric: She has a normal mood and affect. Her behavior is normal. Judgment and thought content normal.    Data Reviewed Cancer center records. Lab work. Colonoscopy report. My records.  Assessment    Adenocarcinoma of the hepatic flexure, final pathologic stage T4a., N0(IIB). Surgically, doing well 12 months postop right colectomy and ventral hernia repair.  Hypertension  Non-insulin-dependent diabetes mellitus  Hyperlipidemia  Hypothyroidism    Plan    She plans to see Dr. Truett Perna every 6 months for surveillance, and that is  appropriate  Repeat colonoscopy in 3 years unless symptoms intervene  Return to see me as needed. At this time there are no surgical problems.       Angelia Mould. Derrell Lolling, M.D., [facility] General and Minimally invasive Surgery Breast and Colorectal Surgery Office:   [phone] Pager:   [phone]  04/29/2013, 12:17 PM

## 2013-04-30 ENCOUNTER — Encounter (INDEPENDENT_AMBULATORY_CARE_PROVIDER_SITE_OTHER): Payer: Self-pay

## 2013-07-01 DIAGNOSIS — M79609 Pain in unspecified limb: Secondary | ICD-10-CM | POA: Diagnosis not present

## 2013-07-01 DIAGNOSIS — E119 Type 2 diabetes mellitus without complications: Secondary | ICD-10-CM | POA: Diagnosis not present

## 2013-07-01 DIAGNOSIS — I10 Essential (primary) hypertension: Secondary | ICD-10-CM | POA: Diagnosis not present

## 2013-07-01 DIAGNOSIS — E039 Hypothyroidism, unspecified: Secondary | ICD-10-CM | POA: Diagnosis not present

## 2013-07-01 DIAGNOSIS — E7800 Pure hypercholesterolemia, unspecified: Secondary | ICD-10-CM | POA: Diagnosis not present

## 2013-07-01 DIAGNOSIS — J309 Allergic rhinitis, unspecified: Secondary | ICD-10-CM | POA: Diagnosis not present

## 2013-07-01 DIAGNOSIS — Z23 Encounter for immunization: Secondary | ICD-10-CM | POA: Diagnosis not present

## 2013-07-01 DIAGNOSIS — M6740 Ganglion, unspecified site: Secondary | ICD-10-CM | POA: Diagnosis not present

## 2013-09-29 ENCOUNTER — Other Ambulatory Visit: Payer: Self-pay

## 2013-09-29 DIAGNOSIS — Z1231 Encounter for screening mammogram for malignant neoplasm of breast: Secondary | ICD-10-CM

## 2013-10-21 ENCOUNTER — Ambulatory Visit (HOSPITAL_BASED_OUTPATIENT_CLINIC_OR_DEPARTMENT_OTHER): Payer: Medicare Other | Admitting: Oncology

## 2013-10-21 ENCOUNTER — Other Ambulatory Visit (HOSPITAL_BASED_OUTPATIENT_CLINIC_OR_DEPARTMENT_OTHER): Payer: Medicare Other

## 2013-10-21 VITALS — BP 132/77 | HR 77 | Temp 98.4°F | Resp 19 | Ht 71.0 in | Wt 209.8 lb

## 2013-10-21 DIAGNOSIS — D509 Iron deficiency anemia, unspecified: Secondary | ICD-10-CM

## 2013-10-21 DIAGNOSIS — I10 Essential (primary) hypertension: Secondary | ICD-10-CM

## 2013-10-21 DIAGNOSIS — E119 Type 2 diabetes mellitus without complications: Secondary | ICD-10-CM | POA: Diagnosis not present

## 2013-10-21 DIAGNOSIS — C182 Malignant neoplasm of ascending colon: Secondary | ICD-10-CM

## 2013-10-21 DIAGNOSIS — C189 Malignant neoplasm of colon, unspecified: Secondary | ICD-10-CM

## 2013-10-21 NOTE — Progress Notes (Signed)
  [name]   Diagnosis: Colon cancer  INTERVAL HISTORY:   [name] returns as scheduled. She feels well. The skin hyperpigmentation has resolved.  Objective:  Vital signs in last 24 hours:  Blood pressure 132/77, pulse 77, temperature 98.4 F (36.9 C), temperature source Oral, resp. rate 19, height 5\' 11"  (1.803 m), weight 209 lb 12.8 oz (95.165 kg).    HEENT: Neck without mass Lymphatics: No cervical, supraclavicular, axillary, or inguinal nodes Resp: Lungs clear bilaterally Cardio: Regular rate and rhythm GI: No hepatomegaly, nontender, no mass Vascular: No leg edema, trace ankle edema bilaterally  Skin: Hands without hyperpigmentation     Lab Results:   Lab Results  Component Value Date   CEA 0.9 04/22/2013   Medications: I have reviewed the patient's current medications.  Assessment/Plan: 1. 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 She began adjuvant Xeloda on 05/14/2012. Final cycle of Xeloda started on 10/22/2012. 2. Severe microcytic anemia likely secondary to iron deficiency related to bleeding from the colon tumor. The hemoglobin and MCV improved on oral iron. Oral iron was discontinued following office visit 12/16/2012. 3. Diabetes. 4. Hypertension. 5. Hyperlipidemia. 6. History of hand/foot syndrome secondary to Xeloda prompting the Xeloda to be discontinued after day 12, cycle 2. Improved. Xeloda was dose reduced beginning with cycle 3. 7. History of mild diarrhea secondary to Xeloda.  8. Eye tearing-most likely related to seasonal allergies though this could be a manifestation of Xeloda toxicity. She did not complain of eye tearing at today's visit. 9. Status post root canal 10/02/2012. 10. Colonoscopy 03/13/2013 by Dr. Amedeo Plenty. Pathology on transverse colon polyp showed a tubular adenoma.   Disposition:  Judith Stephens remains in clinical  remission from colon cancer. We will followup on the CEA from today. She will return for an office visit and CEA in 6 months.  Ladell Pier, MD  10/21/2013  3:23 PM

## 2013-10-22 ENCOUNTER — Telehealth: Payer: Self-pay | Admitting: *Deleted

## 2013-10-22 ENCOUNTER — Telehealth: Payer: Self-pay | Admitting: Oncology

## 2013-10-22 LAB — CEA: CEA: 1.1 ng/mL (ref 0.0–5.0)

## 2013-10-22 NOTE — Telephone Encounter (Signed)
Message copied by Tania Ade on Wed Oct 22, 2013  3:35 PM ------      Message from: Betsy Coder B      Created: Wed Oct 22, 2013  9:03 AM       Please call patient, cea is normal ------

## 2013-10-22 NOTE — Telephone Encounter (Signed)
s.w. pt adn advised on NOV appt...pt ok adn aware °

## 2013-10-22 NOTE — Telephone Encounter (Signed)
Notified of normal CEA. 

## 2013-11-12 ENCOUNTER — Ambulatory Visit
Admission: RE | Admit: 2013-11-12 | Discharge: 2013-11-12 | Disposition: A | Discharge status: Home / Self Care | Payer: Medicare Other | Source: Ambulatory Visit

## 2013-11-12 ENCOUNTER — Encounter (INDEPENDENT_AMBULATORY_CARE_PROVIDER_SITE_OTHER): Payer: Self-pay

## 2013-11-12 DIAGNOSIS — Z1231 Encounter for screening mammogram for malignant neoplasm of breast: Secondary | ICD-10-CM | POA: Diagnosis not present

## 2013-12-10 DIAGNOSIS — N83209 Unspecified ovarian cyst, unspecified side: Secondary | ICD-10-CM | POA: Diagnosis not present

## 2013-12-10 DIAGNOSIS — Z01419 Encounter for gynecological examination (general) (routine) without abnormal findings: Secondary | ICD-10-CM | POA: Diagnosis not present

## 2013-12-30 DIAGNOSIS — I10 Essential (primary) hypertension: Secondary | ICD-10-CM | POA: Diagnosis not present

## 2013-12-30 DIAGNOSIS — N83209 Unspecified ovarian cyst, unspecified side: Secondary | ICD-10-CM | POA: Diagnosis not present

## 2013-12-30 DIAGNOSIS — E7800 Pure hypercholesterolemia, unspecified: Secondary | ICD-10-CM | POA: Diagnosis not present

## 2013-12-30 DIAGNOSIS — E039 Hypothyroidism, unspecified: Secondary | ICD-10-CM | POA: Diagnosis not present

## 2013-12-30 DIAGNOSIS — E119 Type 2 diabetes mellitus without complications: Secondary | ICD-10-CM | POA: Diagnosis not present

## 2014-03-17 DIAGNOSIS — E039 Hypothyroidism, unspecified: Secondary | ICD-10-CM | POA: Diagnosis not present

## 2014-03-25 NOTE — Telephone Encounter (Signed)
Encounter opened in error

## 2014-04-17 DIAGNOSIS — Z23 Encounter for immunization: Secondary | ICD-10-CM | POA: Diagnosis not present

## 2014-04-21 ENCOUNTER — Telehealth: Payer: Self-pay | Admitting: Oncology

## 2014-04-21 NOTE — Telephone Encounter (Signed)
lmonvm for pt re new appt for 11/24 moved from 11/20. schedule mailed.

## 2014-05-08 ENCOUNTER — Other Ambulatory Visit: Payer: Medicare Other

## 2014-05-08 ENCOUNTER — Ambulatory Visit: Payer: Medicare Other | Admitting: Oncology

## 2014-05-12 ENCOUNTER — Other Ambulatory Visit (HOSPITAL_BASED_OUTPATIENT_CLINIC_OR_DEPARTMENT_OTHER): Payer: Medicare Other

## 2014-05-12 ENCOUNTER — Ambulatory Visit (HOSPITAL_BASED_OUTPATIENT_CLINIC_OR_DEPARTMENT_OTHER): Payer: Medicare Other | Admitting: Oncology

## 2014-05-12 ENCOUNTER — Telehealth: Payer: Self-pay | Admitting: Oncology

## 2014-05-12 VITALS — BP 138/70 | HR 67 | Temp 98.2°F | Resp 18 | Ht 71.0 in | Wt 212.1 lb

## 2014-05-12 DIAGNOSIS — E119 Type 2 diabetes mellitus without complications: Secondary | ICD-10-CM

## 2014-05-12 DIAGNOSIS — Z85038 Personal history of other malignant neoplasm of large intestine: Secondary | ICD-10-CM | POA: Diagnosis not present

## 2014-05-12 DIAGNOSIS — I10 Essential (primary) hypertension: Secondary | ICD-10-CM

## 2014-05-12 DIAGNOSIS — D509 Iron deficiency anemia, unspecified: Secondary | ICD-10-CM

## 2014-05-12 DIAGNOSIS — C189 Malignant neoplasm of colon, unspecified: Secondary | ICD-10-CM

## 2014-05-12 NOTE — Telephone Encounter (Signed)
Gave avs & cal for May 2016. °

## 2014-05-12 NOTE — Progress Notes (Signed)
  Judith Stephens OFFICE PROGRESS NOTE   Diagnosis: Colon cancer  INTERVAL HISTORY:   Judith Stephens returns as scheduled. Good appetite. She feels well. The eye tearing has improved. Occasional constipation. The Xeloda-induced hyperpigmentation has resolved.  Objective:  Vital signs in last 24 hours:  There were no vitals taken for this visit.    HEENT: Neck without mass Lymphatics: No cervical, supra-clavicular, axillary, or inguinal nodes Resp: Lungs clear bilaterally Cardio: Regular rate and rhythm GI: No hepatomegaly, nontender, no mass Vascular: No leg edema   Lab Results:   Lab Results  Component Value Date   CEA 1.1 10/21/2013    Medications: I have reviewed the patient's current medications.  Assessment/Plan: 1. 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 She began adjuvant Xeloda on 05/14/2012. Final cycle of Xeloda started on 10/22/2012. 2. Severe microcytic anemia likely secondary to iron deficiency related to bleeding from the colon tumor. The hemoglobin and MCV improved on oral iron. Oral iron was discontinued following office visit 12/16/2012. 3. Diabetes. 4. Hypertension. 5. Hyperlipidemia. 6. History of hand/foot syndrome secondary to Xeloda prompting the Xeloda to be discontinued after day 12, cycle 2. Improved. Xeloda was dose reduced beginning with cycle 3. 7. History of mild diarrhea secondary to Xeloda.  8. Eye tearing-most likely related to seasonal allergies though this could be a manifestation of Xeloda toxicity. Improved.  9. Status post root canal 10/02/2012. 10. Colonoscopy 03/13/2013 by Dr. Amedeo Plenty. Pathology on transverse colon polyp showed a tubular adenoma.   Disposition:  Judith Stephens remains in clinical remission from colon cancer. We will follow-up on the CEA from today. She will return for an office visit in 6 months.  Betsy Coder,  MD  05/12/2014  11:40 AM

## 2014-05-13 ENCOUNTER — Telehealth: Payer: Self-pay | Admitting: *Deleted

## 2014-05-13 LAB — CEA: CEA: 1.1 ng/mL (ref 0.0–5.0)

## 2014-05-13 NOTE — Telephone Encounter (Signed)
Notified of normal CEA. 

## 2014-05-13 NOTE — Telephone Encounter (Signed)
-----   Message from Ladell Pier, MD sent at 05/13/2014  9:36 AM EST ----- Please call patient, cea is normal

## 2014-05-25 DIAGNOSIS — E119 Type 2 diabetes mellitus without complications: Secondary | ICD-10-CM | POA: Diagnosis not present

## 2014-05-25 DIAGNOSIS — H40013 Open angle with borderline findings, low risk, bilateral: Secondary | ICD-10-CM | POA: Diagnosis not present

## 2014-05-25 DIAGNOSIS — H2513 Age-related nuclear cataract, bilateral: Secondary | ICD-10-CM | POA: Diagnosis not present

## 2014-05-25 DIAGNOSIS — H25013 Cortical age-related cataract, bilateral: Secondary | ICD-10-CM | POA: Diagnosis not present

## 2014-07-03 DIAGNOSIS — I10 Essential (primary) hypertension: Secondary | ICD-10-CM | POA: Diagnosis not present

## 2014-07-03 DIAGNOSIS — J309 Allergic rhinitis, unspecified: Secondary | ICD-10-CM | POA: Diagnosis not present

## 2014-07-03 DIAGNOSIS — E119 Type 2 diabetes mellitus without complications: Secondary | ICD-10-CM | POA: Diagnosis not present

## 2014-07-03 DIAGNOSIS — E780 Pure hypercholesterolemia: Secondary | ICD-10-CM | POA: Diagnosis not present

## 2014-07-03 DIAGNOSIS — G5621 Lesion of ulnar nerve, right upper limb: Secondary | ICD-10-CM | POA: Diagnosis not present

## 2014-07-03 DIAGNOSIS — E039 Hypothyroidism, unspecified: Secondary | ICD-10-CM | POA: Diagnosis not present

## 2014-08-21 ENCOUNTER — Encounter: Payer: Self-pay | Admitting: Podiatrist

## 2014-08-21 ENCOUNTER — Ambulatory Visit (INDEPENDENT_AMBULATORY_CARE_PROVIDER_SITE_OTHER): Payer: Medicare Other | Admitting: Podiatrist

## 2014-08-21 DIAGNOSIS — E1140 Type 2 diabetes mellitus with diabetic neuropathy, unspecified: Secondary | ICD-10-CM

## 2014-08-21 DIAGNOSIS — B351 Tinea unguium: Secondary | ICD-10-CM | POA: Diagnosis not present

## 2014-08-21 DIAGNOSIS — M79676 Pain in unspecified toe(s): Secondary | ICD-10-CM | POA: Diagnosis not present

## 2014-08-21 NOTE — Progress Notes (Signed)
  Chief Complaint  Patient presents with  . Debridemet    Trim toenails     HPI: Patient is 74 y.o. female who presents today for a diabetic foot evaluation. She relates continued neuropathy which is not bothersome and relates some problems with her toenails. States the left hallux nail fell off and is growing back very slowly.   No Known Allergies  Physical Exam  Patient is awake, alert, and oriented x 3.  In no acute distress.  Vascular status is intact with palpable pedal pulses at 1/4 DP and PT bilateral and capillary refill time within normal limits. Neurological sensation is also intact bilaterally via Semmes Weinstein monofilament at 3/5 sites with sensation of metatarsal heads absent. Light touch, vibratory sensation decreased, Achilles tendon reflex is intact. Dermatological exam reveals elongated, thickened, dystrophic toenails bilateral. Otherwise skin color, turger and texture as normal. No open lesions present.  Musculature intact with dorsiflexion, plantarflexion, inversion, eversion. Mild digital contractures noted lesser digits bilateral  Assessment: Diabetes with neuropathy, symptomatic mycotic toenails  Plan: Debridement of symptomatic toenails accomplished today without complication. A diabetic foot evaluation was carried out. Discussed routine foot checks and information about diabetic footcare was dispensed. Discussed diabetic shoes however the shoes she wears appear to fit her nicely. She'll be seen back for yearly foot checks or as needed for follow-up.

## 2014-08-21 NOTE — Patient Instructions (Signed)
Diabetes and Foot Care Diabetes may cause you to have problems because of poor blood supply (circulation) to your feet and legs. This may cause the skin on your feet to become thinner, break easier, and heal more slowly. Your skin may become dry, and the skin may peel and crack. You may also have nerve damage in your legs and feet causing decreased feeling in them. You may not notice minor injuries to your feet that could lead to infections or more serious problems. Taking care of your feet is one of the most important things you can do for yourself.  HOME CARE INSTRUCTIONS  Wear shoes at all times, even in the house. Do not go barefoot. Bare feet are easily injured.  Check your feet daily for blisters, cuts, and redness. If you cannot see the bottom of your feet, use a mirror or ask someone for help.  Wash your feet with warm water (do not use hot water) and mild soap. Then pat your feet and the areas between your toes until they are completely dry. Do not soak your feet as this can dry your skin.  Apply a moisturizing lotion or petroleum jelly (that does not contain alcohol and is unscented) to the skin on your feet and to dry, brittle toenails. Do not apply lotion between your toes.  Trim your toenails straight across. Do not dig under them or around the cuticle. File the edges of your nails with an emery board or nail file.  Do not cut corns or calluses or try to remove them with medicine.  Wear clean socks or stockings every day. Make sure they are not too tight. Do not wear knee-high stockings since they may decrease blood flow to your legs.  Wear shoes that fit properly and have enough cushioning. To break in new shoes, wear them for just a few hours a day. This prevents you from injuring your feet. Always look in your shoes before you put them on to be sure there are no objects inside.  Do not cross your legs. This may decrease the blood flow to your feet.  If you find a minor scrape,  cut, or break in the skin on your feet, keep it and the skin around it clean and dry. These areas may be cleansed with mild soap and water. Do not cleanse the area with peroxide, alcohol, or iodine.  When you remove an adhesive bandage, be sure not to damage the skin around it.  If you have a wound, look at it several times a day to make sure it is healing.  Do not use heating pads or hot water bottles. They may burn your skin. If you have lost feeling in your feet or legs, you may not know it is happening until it is too late.  Make sure your health care provider performs a complete foot exam at least annually or more often if you have foot problems. Report any cuts, sores, or bruises to your health care provider immediately. SEEK MEDICAL CARE IF:   You have an injury that is not healing.  You have cuts or breaks in the skin.  You have an ingrown nail.  You notice redness on your legs or feet.  You feel burning or tingling in your legs or feet.  You have pain or cramps in your legs and feet.  Your legs or feet are numb.  Your feet always feel cold. SEEK IMMEDIATE MEDICAL CARE IF:   There is increasing redness,   swelling, or pain in or around a wound.  There is a red line that goes up your leg.  Pus is coming from a wound.  You develop a fever or as directed by your health care provider.  You notice a bad smell coming from an ulcer or wound. Document Released: 06/02/2000 Document Revised: 02/05/2013 Document Reviewed: 11/12/2012 Exit[employer] Patient Information 2015 [employer]. This information is not intended to replace advice given to you by your health care provider. Make sure you discuss any questions you have with your health care provider.  

## 2014-10-05 DIAGNOSIS — H40023 Open angle with borderline findings, high risk, bilateral: Secondary | ICD-10-CM | POA: Diagnosis not present

## 2014-11-10 ENCOUNTER — Ambulatory Visit (HOSPITAL_BASED_OUTPATIENT_CLINIC_OR_DEPARTMENT_OTHER): Payer: Medicare Other | Admitting: Nurse Practitioner

## 2014-11-10 ENCOUNTER — Telehealth: Payer: Self-pay | Admitting: Nurse Practitioner

## 2014-11-10 ENCOUNTER — Other Ambulatory Visit: Payer: Medicare Other

## 2014-11-10 VITALS — BP 119/65 | HR 78 | Temp 97.8°F | Resp 18 | Ht 71.0 in | Wt 209.0 lb

## 2014-11-10 DIAGNOSIS — C189 Malignant neoplasm of colon, unspecified: Secondary | ICD-10-CM

## 2014-11-10 DIAGNOSIS — D509 Iron deficiency anemia, unspecified: Secondary | ICD-10-CM | POA: Diagnosis not present

## 2014-11-10 DIAGNOSIS — C182 Malignant neoplasm of ascending colon: Secondary | ICD-10-CM

## 2014-11-10 DIAGNOSIS — E119 Type 2 diabetes mellitus without complications: Secondary | ICD-10-CM

## 2014-11-10 NOTE — Progress Notes (Signed)
  [place] OFFICE PROGRESS NOTE   Diagnosis:  Colon cancer  INTERVAL HISTORY:   Ms. Bares returns as scheduled. No change in bowel habits. No bloody or black stools. No pain with bowel movements. No abdominal pain. She has a good appetite. She reports her weight is stable. She denies shortness of breath.  Objective:  Vital signs in last 24 hours:  Blood pressure 119/65, pulse 78, temperature 97.8 F (36.6 C), temperature source Oral, resp. rate 18, height 5\' 11"  (1.803 m), weight 209 lb (94.802 kg), SpO2 100 %.    HEENT: No thrush or ulcers. Lymphatics: No palpable cervical, supra clavicular, axillary or inguinal lymph nodes. Resp: Lungs clear bilaterally. Cardio: Regular rate and rhythm. GI: Abdomen soft and nontender. No hepatomegaly. No mass. Vascular: No leg edema.  Lab Results:  Lab Results  Component Value Date   WBC 5.1 12/16/2012   HGB 13.3 12/16/2012   HCT 38.9 12/16/2012   MCV 95.9 12/16/2012   PLT 258 12/16/2012   NEUTROABS 2.1 12/16/2012    Imaging:  No results found.  Medications: I have reviewed the patient's current medications.  Assessment/Plan: 1. 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 She began adjuvant Xeloda on 05/14/2012. Final cycle of Xeloda started on 10/22/2012. 2. Severe microcytic anemia likely secondary to iron deficiency related to bleeding from the colon tumor. The hemoglobin and MCV improved on oral iron. Oral iron was discontinued following office visit 12/16/2012. 3. Diabetes. 4. Hypertension. 5. Hyperlipidemia. 6. History of hand/foot syndrome secondary to Xeloda prompting the Xeloda to be discontinued after day 12, cycle 2. Improved. Xeloda was dose reduced beginning with cycle 3. 7. History of mild diarrhea secondary to Xeloda.  8. History of eye tearing-most likely related to seasonal allergies though this could be a  manifestation of Xeloda toxicity.   9. Status post root canal 10/02/2012. 10. Colonoscopy 03/13/2013 by Dr. Amedeo Plenty. Pathology on transverse colon polyp showed a tubular adenoma. Next colonoscopy recommended that a three-year interval.   Disposition: Ms. Osgood appears stable. She remains in clinical remission from colon cancer. We will follow-up on the CEA from today. She will return for a follow-up visit and CEA in 6 months. She will contact the office in the interim with any problems.    Ned Card ANP/GNP-BC   11/10/2014  12:30 PM

## 2014-11-10 NOTE — Telephone Encounter (Signed)
er pof to schpt appt-gave pt copy of sch

## 2014-11-11 ENCOUNTER — Other Ambulatory Visit: Payer: Self-pay

## 2014-11-11 DIAGNOSIS — Z1231 Encounter for screening mammogram for malignant neoplasm of breast: Secondary | ICD-10-CM

## 2014-11-11 LAB — CEA: CEA: 1.1 ng/mL (ref 0.0–5.0)

## 2014-11-12 ENCOUNTER — Ambulatory Visit: Payer: Medicare Other

## 2014-11-12 ENCOUNTER — Telehealth: Payer: Self-pay | Admitting: *Deleted

## 2014-11-12 NOTE — Telephone Encounter (Signed)
Return call received from Ms. Flemmer asking for results.  CEA on 11-10-2014 = 1.1.  Results given.

## 2014-11-12 NOTE — Telephone Encounter (Signed)
-----   Message from Ladell Pier, MD sent at 11/11/2014  8:44 PM EDT ----- Please call patient, cea is normal

## 2014-11-12 NOTE — Telephone Encounter (Signed)
Left message for patient to call back regarding lab results.

## 2014-12-09 DIAGNOSIS — H40023 Open angle with borderline findings, high risk, bilateral: Secondary | ICD-10-CM | POA: Diagnosis not present

## 2014-12-11 ENCOUNTER — Ambulatory Visit: Payer: Medicare Other | Admitting: Podiatry

## 2014-12-15 ENCOUNTER — Ambulatory Visit: Payer: Medicare Other

## 2014-12-31 DIAGNOSIS — J309 Allergic rhinitis, unspecified: Secondary | ICD-10-CM | POA: Diagnosis not present

## 2014-12-31 DIAGNOSIS — E119 Type 2 diabetes mellitus without complications: Secondary | ICD-10-CM | POA: Diagnosis not present

## 2014-12-31 DIAGNOSIS — E039 Hypothyroidism, unspecified: Secondary | ICD-10-CM | POA: Diagnosis not present

## 2014-12-31 DIAGNOSIS — R252 Cramp and spasm: Secondary | ICD-10-CM | POA: Diagnosis not present

## 2014-12-31 DIAGNOSIS — E780 Pure hypercholesterolemia: Secondary | ICD-10-CM | POA: Diagnosis not present

## 2014-12-31 DIAGNOSIS — I10 Essential (primary) hypertension: Secondary | ICD-10-CM | POA: Diagnosis not present

## 2015-01-08 ENCOUNTER — Ambulatory Visit (INDEPENDENT_AMBULATORY_CARE_PROVIDER_SITE_OTHER): Payer: Medicare Other | Admitting: Podiatry

## 2015-01-08 DIAGNOSIS — M79676 Pain in unspecified toe(s): Secondary | ICD-10-CM

## 2015-01-08 DIAGNOSIS — E1140 Type 2 diabetes mellitus with diabetic neuropathy, unspecified: Secondary | ICD-10-CM

## 2015-01-08 DIAGNOSIS — B351 Tinea unguium: Secondary | ICD-10-CM | POA: Diagnosis not present

## 2015-01-08 NOTE — Progress Notes (Signed)
Subjective: 74 y.o. returns the office today for painful, elongated, thickened toenails which she cannot trim herself. Denies any redness or drainage around the nails. Denies any acute changes since last appointment and no new complaints today. Denies any systemic complaints such as fevers, chills, nausea, vomiting.   Objective: AAO 3, NAD DP/PT pulses palpable, CRT less than 3 seconds Protective sensation decreased with Simms Weinstein monofilament Nails hypertrophic, dystrophic, elongated, brittle, discolored 10. There is tenderness overlying the nails 1-5 bilaterally. There is no surrounding erythema or drainage along the nail sites. No open lesions or pre-ulcerative lesions are identified. No other areas of tenderness bilateral lower extremities. No overlying edema, erythema, increased warmth. No pain with calf compression, swelling, warmth, erythema.  Assessment: Patient presents with symptomatic onychomycosis  Plan: -Treatment options including alternatives, risks, complications were discussed -Nails sharply debrided 10 without complication/bleeding. -Discussed daily foot inspection. If there are any changes, to call the office immediately.  -Follow-up in 3 months or sooner if any problems are to arise. In the meantime, encouraged to call the office with any questions, concerns, changes symptoms.   Celesta Gentile, DPM

## 2015-01-14 ENCOUNTER — Ambulatory Visit
Admission: RE | Admit: 2015-01-14 | Discharge: 2015-01-14 | Disposition: A | Discharge status: Home / Self Care | Payer: Medicare Other | Source: Ambulatory Visit

## 2015-01-14 DIAGNOSIS — Z1231 Encounter for screening mammogram for malignant neoplasm of breast: Secondary | ICD-10-CM

## 2015-04-09 DIAGNOSIS — Z23 Encounter for immunization: Secondary | ICD-10-CM | POA: Diagnosis not present

## 2015-04-16 ENCOUNTER — Ambulatory Visit: Payer: Medicare Other | Admitting: Podiatry

## 2015-04-30 ENCOUNTER — Encounter: Payer: Self-pay | Admitting: Podiatry

## 2015-04-30 ENCOUNTER — Ambulatory Visit (INDEPENDENT_AMBULATORY_CARE_PROVIDER_SITE_OTHER): Payer: Medicare Other | Admitting: Podiatry

## 2015-04-30 DIAGNOSIS — E1140 Type 2 diabetes mellitus with diabetic neuropathy, unspecified: Secondary | ICD-10-CM | POA: Diagnosis not present

## 2015-04-30 DIAGNOSIS — B351 Tinea unguium: Secondary | ICD-10-CM | POA: Diagnosis not present

## 2015-04-30 DIAGNOSIS — M79676 Pain in unspecified toe(s): Secondary | ICD-10-CM

## 2015-05-02 NOTE — Progress Notes (Signed)
Subjective: 74 y.o. returns the office today for painful, elongated, thickened toenails which she cannot trim herself. Denies any redness or drainage around the nails. Denies any acute changes since last appointment and no new complaints today. Denies any systemic complaints such as fevers, chills, nausea, vomiting.   Objective: AAO 3, NAD DP/PT pulses palpable, CRT less than 3 seconds Protective sensation decreased with Simms Weinstein monofilament Nails hypertrophic, dystrophic, elongated, brittle, discolored 10. There is tenderness overlying the nails 1-5 bilaterally. There is no surrounding erythema or drainage along the nail sites. No open lesions or pre-ulcerative lesions are identified. No other areas of tenderness bilateral lower extremities. No overlying edema, erythema, increased warmth. No pain with calf compression, swelling, warmth, erythema.  Assessment: Patient presents with symptomatic onychomycosis  Plan: -Treatment options including alternatives, risks, complications were discussed -Nails sharply debrided 10 without complication/bleeding. -Discussed daily foot inspection. If there are any changes, to call the office immediately.  -Follow-up in 3 months or sooner if any problems are to arise. In the meantime, encouraged to call the office with any questions, concerns, changes symptoms.   Starlit Raburn, DPM   

## 2015-05-12 ENCOUNTER — Telehealth: Payer: Self-pay | Admitting: Oncology

## 2015-05-12 NOTE — Telephone Encounter (Signed)
s.w. pt and advised on 11.29 appt cx and moved to 1.17.Marland KitchenMarland KitchenMarland Kitchenpt ok and aware

## 2015-05-18 ENCOUNTER — Other Ambulatory Visit: Payer: Medicare Other

## 2015-05-18 ENCOUNTER — Ambulatory Visit: Payer: Medicare Other | Admitting: Oncology

## 2015-07-06 ENCOUNTER — Other Ambulatory Visit (HOSPITAL_BASED_OUTPATIENT_CLINIC_OR_DEPARTMENT_OTHER): Payer: Medicare Other

## 2015-07-06 ENCOUNTER — Ambulatory Visit (HOSPITAL_BASED_OUTPATIENT_CLINIC_OR_DEPARTMENT_OTHER): Payer: Medicare Other | Admitting: Oncology

## 2015-07-06 ENCOUNTER — Telehealth: Payer: Self-pay | Admitting: Oncology

## 2015-07-06 VITALS — BP 141/88 | HR 79 | Temp 98.2°F | Resp 18 | Ht 71.0 in | Wt 209.4 lb

## 2015-07-06 DIAGNOSIS — E785 Hyperlipidemia, unspecified: Secondary | ICD-10-CM

## 2015-07-06 DIAGNOSIS — C189 Malignant neoplasm of colon, unspecified: Secondary | ICD-10-CM | POA: Diagnosis not present

## 2015-07-06 DIAGNOSIS — Z85038 Personal history of other malignant neoplasm of large intestine: Secondary | ICD-10-CM

## 2015-07-06 DIAGNOSIS — I10 Essential (primary) hypertension: Secondary | ICD-10-CM

## 2015-07-06 DIAGNOSIS — D509 Iron deficiency anemia, unspecified: Secondary | ICD-10-CM | POA: Diagnosis not present

## 2015-07-06 DIAGNOSIS — C182 Malignant neoplasm of ascending colon: Secondary | ICD-10-CM

## 2015-07-06 DIAGNOSIS — E119 Type 2 diabetes mellitus without complications: Secondary | ICD-10-CM

## 2015-07-06 NOTE — Progress Notes (Signed)
  [place] OFFICE PROGRESS NOTE   Diagnosis: Colon cancer  INTERVAL HISTORY:   Ms. Batterson returns as scheduled. Good appetite. No difficulty with bowel function. She has chronic leg pain.  Objective:  Vital signs in last 24 hours:  Blood pressure 141/88, pulse 79, temperature 98.2 F (36.8 C), temperature source Oral, resp. rate 18, height 5\' 11"  (1.803 m), weight 209 lb 6.4 oz (94.983 kg), SpO2 100 %.    HEENT: Neck without mass Lymphatics: No cervical, supraclavicular, axillary, or inguinal nodes Resp: Lungs clear bilaterally Cardio: Regular rate and rhythm GI: No hepatomegaly, nontender, no mass Vascular: No leg edema   Lab Results:   Lab Results  Component Value Date   CEA 1.1 11/10/2014    Medications: I have reviewed the patient's current medications.  Assessment/Plan: 1. 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 She began adjuvant Xeloda on 05/14/2012. Final cycle of Xeloda started on 10/22/2012. 2. Severe microcytic anemia likely secondary to iron deficiency related to bleeding from the colon tumor. The hemoglobin and MCV improved on oral iron. Oral iron was discontinued following office visit 12/16/2012. 3. Diabetes. 4. Hypertension. 5. Hyperlipidemia. 6. History of hand/foot syndrome secondary to Xeloda prompting the Xeloda to be discontinued after day 12, cycle 2. Improved. Xeloda was dose reduced beginning with cycle 3. 7. History of mild diarrhea secondary to Xeloda.  8. History of eye tearing-most likely related to seasonal allergies though this could be a manifestation of Xeloda toxicity.  9. Status post root canal 10/02/2012. 10. Colonoscopy 03/13/2013 by Dr. Amedeo Plenty. Pathology on transverse colon polyp showed a tubular adenoma. Next colonoscopy recommended that a three-year interval.    Disposition:  Ms. Kalmar remains in clinical remission from colon  cancer. We will follow-up on the CEA from today. She will return for an office visit and CEA in 8 months. Ms. Cinnamon is due for a surveillance colonoscopy with Dr. Amedeo Plenty this year.  Betsy Coder, MD  07/06/2015  12:41 PM

## 2015-07-06 NOTE — Telephone Encounter (Signed)
Talked to patient here in office. Scheduled appt.       [facility]. °

## 2015-07-07 ENCOUNTER — Telehealth: Payer: Self-pay | Admitting: *Deleted

## 2015-07-07 LAB — CEA: CEA: 4.4 ng/mL (ref 0.0–4.7)

## 2015-07-07 NOTE — Telephone Encounter (Signed)
Per Dr. Benay Spice; notified pt that cea is normal by new method-but slightly higher in normal range, will repeat in 3 months.  Pt verbalized understanding.

## 2015-07-07 NOTE — Telephone Encounter (Signed)
-----   Message from Ladell Pier, MD sent at 07/07/2015  2:59 PM EST ----- Please call patient, cea is normal by new method- but slightly higher in normal range, repeat 3 months

## 2015-07-09 ENCOUNTER — Ambulatory Visit: Payer: Medicare Other | Admitting: Podiatry

## 2015-07-09 DIAGNOSIS — H25013 Cortical age-related cataract, bilateral: Secondary | ICD-10-CM | POA: Diagnosis not present

## 2015-07-09 DIAGNOSIS — H40023 Open angle with borderline findings, high risk, bilateral: Secondary | ICD-10-CM | POA: Diagnosis not present

## 2015-07-09 DIAGNOSIS — E119 Type 2 diabetes mellitus without complications: Secondary | ICD-10-CM | POA: Diagnosis not present

## 2015-07-09 DIAGNOSIS — H2513 Age-related nuclear cataract, bilateral: Secondary | ICD-10-CM | POA: Diagnosis not present

## 2015-07-16 ENCOUNTER — Encounter: Payer: Self-pay | Admitting: Podiatry

## 2015-07-16 ENCOUNTER — Ambulatory Visit (INDEPENDENT_AMBULATORY_CARE_PROVIDER_SITE_OTHER): Payer: Medicare Other | Admitting: Podiatry

## 2015-07-16 DIAGNOSIS — B351 Tinea unguium: Secondary | ICD-10-CM | POA: Diagnosis not present

## 2015-07-16 DIAGNOSIS — E1140 Type 2 diabetes mellitus with diabetic neuropathy, unspecified: Secondary | ICD-10-CM

## 2015-07-16 DIAGNOSIS — M79676 Pain in unspecified toe(s): Secondary | ICD-10-CM | POA: Diagnosis not present

## 2015-07-16 NOTE — Progress Notes (Signed)
Patient ID: Minh Hagman, female   DOB: 27-May-1941, 75 y.o.   MRN: [id number] Complaint:  Visit Type: Patient returns to my office for continued preventative foot care services. Complaint: Patient states" my nails have grown long and thick and become painful to walk and wear shoes" Patient has been diagnosed with DM with no foot complications. The patient presents for preventative foot care services. No changes to ROS  Podiatric Exam: Vascular: dorsalis pedis and posterior tibial pulses are palpable bilateral. Capillary return is immediate. Temperature gradient is WNL. Skin turgor WNL  Sensorium: Diminished  Semmes Weinstein monofilament test. Normal tactile sensation bilaterally. Nail Exam: Pt has thick disfigured discolored nails with subungual debris noted bilateral entire nail hallux through fifth toenails Ulcer Exam: There is no evidence of ulcer or pre-ulcerative changes or infection. Orthopedic Exam: Muscle tone and strength are WNL. No limitations in general ROM. No crepitus or effusions noted. Foot type and digits show no abnormalities. Bony prominences are unremarkable. Skin: No Porokeratosis. No infection or ulcers  Diagnosis:  Onychomycosis, , Pain in right toe, pain in left toes  Treatment & Plan Procedures and Treatment: Consent by patient was obtained for treatment procedures. The patient understood the discussion of treatment and procedures well. All questions were answered thoroughly reviewed. Debridement of mycotic and hypertrophic toenails, 1 through 5 bilateral and clearing of subungual debris. No ulceration, no infection noted.  Return Visit-Office Procedure: Patient instructed to return to the office for a follow up visit 3 months for continued evaluation and treatment.    Gardiner Barefoot DPM

## 2015-07-20 DIAGNOSIS — E039 Hypothyroidism, unspecified: Secondary | ICD-10-CM | POA: Diagnosis not present

## 2015-07-20 DIAGNOSIS — Z7984 Long term (current) use of oral hypoglycemic drugs: Secondary | ICD-10-CM | POA: Diagnosis not present

## 2015-07-20 DIAGNOSIS — E7800 Pure hypercholesterolemia, unspecified: Secondary | ICD-10-CM | POA: Diagnosis not present

## 2015-07-20 DIAGNOSIS — E119 Type 2 diabetes mellitus without complications: Secondary | ICD-10-CM | POA: Diagnosis not present

## 2015-07-20 DIAGNOSIS — J309 Allergic rhinitis, unspecified: Secondary | ICD-10-CM | POA: Diagnosis not present

## 2015-07-20 DIAGNOSIS — I10 Essential (primary) hypertension: Secondary | ICD-10-CM | POA: Diagnosis not present

## 2015-07-20 DIAGNOSIS — G5621 Lesion of ulnar nerve, right upper limb: Secondary | ICD-10-CM | POA: Diagnosis not present

## 2015-09-23 ENCOUNTER — Telehealth: Payer: Self-pay | Admitting: Oncology

## 2015-09-23 NOTE — Telephone Encounter (Signed)
returned call and s.w. pt and confirmed appt....pt ok and aware °

## 2015-10-22 ENCOUNTER — Ambulatory Visit: Payer: Medicare Other | Admitting: Podiatry

## 2015-11-12 ENCOUNTER — Ambulatory Visit: Payer: Medicare Other | Admitting: Podiatry

## 2015-11-17 DIAGNOSIS — J329 Chronic sinusitis, unspecified: Secondary | ICD-10-CM | POA: Diagnosis not present

## 2015-11-26 ENCOUNTER — Encounter: Payer: Self-pay | Admitting: Podiatry

## 2015-11-26 ENCOUNTER — Ambulatory Visit (INDEPENDENT_AMBULATORY_CARE_PROVIDER_SITE_OTHER): Payer: Medicare Other | Admitting: Podiatry

## 2015-11-26 DIAGNOSIS — E1140 Type 2 diabetes mellitus with diabetic neuropathy, unspecified: Secondary | ICD-10-CM

## 2015-11-26 DIAGNOSIS — B351 Tinea unguium: Secondary | ICD-10-CM

## 2015-11-26 DIAGNOSIS — M79676 Pain in unspecified toe(s): Secondary | ICD-10-CM

## 2015-11-26 NOTE — Progress Notes (Signed)
Patient ID: Judith Stephens, female   DOB: [birth year], 75 y.o.   MRN: [id number] Complaint:  Visit Type: Patient returns to my office for continued preventative foot care services. Complaint: Patient states" my nails have grown long and thick and become painful to walk and wear shoes" Patient has been diagnosed with DM with no foot complications. The patient presents for preventative foot care services. No changes to ROS  Podiatric Exam: Vascular: dorsalis pedis and posterior tibial pulses are palpable bilateral. Capillary return is immediate. Temperature gradient is WNL. Skin turgor WNL  Sensorium: Diminished  Semmes Weinstein monofilament test. Normal tactile sensation bilaterally. Nail Exam: Pt has thick disfigured discolored nails with subungual debris noted bilateral entire nail hallux through fifth toenails Ulcer Exam: There is no evidence of ulcer or pre-ulcerative changes or infection. Orthopedic Exam: Muscle tone and strength are WNL. No limitations in general ROM. No crepitus or effusions noted. Foot type and digits show no abnormalities. Bony prominences are unremarkable. Skin: No Porokeratosis. No infection or ulcers  Diagnosis:  Onychomycosis, , Pain in right toe, pain in left toes  Treatment & Plan Procedures and Treatment: Consent by patient was obtained for treatment procedures. The patient understood the discussion of treatment and procedures well. All questions were answered thoroughly reviewed. Debridement of mycotic and hypertrophic toenails, 1 through 5 bilateral and clearing of subungual debris. No ulceration, no infection noted.  Return Visit-Office Procedure: Patient instructed to return to the office for a follow up visit 3 months for continued evaluation and treatment.    Gardiner Barefoot DPM

## 2015-12-17 DIAGNOSIS — J309 Allergic rhinitis, unspecified: Secondary | ICD-10-CM | POA: Diagnosis not present

## 2015-12-26 DIAGNOSIS — H832X9 Labyrinthine dysfunction, unspecified ear: Secondary | ICD-10-CM | POA: Diagnosis not present

## 2015-12-26 DIAGNOSIS — N390 Urinary tract infection, site not specified: Secondary | ICD-10-CM | POA: Diagnosis not present

## 2015-12-26 DIAGNOSIS — D649 Anemia, unspecified: Secondary | ICD-10-CM | POA: Diagnosis not present

## 2015-12-26 DIAGNOSIS — R634 Abnormal weight loss: Secondary | ICD-10-CM | POA: Diagnosis not present

## 2015-12-31 DIAGNOSIS — I10 Essential (primary) hypertension: Secondary | ICD-10-CM | POA: Diagnosis not present

## 2015-12-31 DIAGNOSIS — R42 Dizziness and giddiness: Secondary | ICD-10-CM | POA: Diagnosis not present

## 2015-12-31 DIAGNOSIS — D649 Anemia, unspecified: Secondary | ICD-10-CM | POA: Diagnosis not present

## 2016-01-09 ENCOUNTER — Inpatient Hospital Stay (HOSPITAL_COMMUNITY)
Admission: EM | Admit: 2016-01-09 | Discharge: 2016-01-13 | DRG: 871 | Disposition: A | Discharge status: Home Health | Payer: Medicare Other | Attending: Internal Medicine | Admitting: Internal Medicine

## 2016-01-09 ENCOUNTER — Emergency Department (HOSPITAL_COMMUNITY): Payer: Medicare Other

## 2016-01-09 ENCOUNTER — Encounter (HOSPITAL_COMMUNITY): Payer: Self-pay | Admitting: Emergency Medicine

## 2016-01-09 DIAGNOSIS — G9341 Metabolic encephalopathy: Secondary | ICD-10-CM | POA: Diagnosis present

## 2016-01-09 DIAGNOSIS — Z8249 Family history of ischemic heart disease and other diseases of the circulatory system: Secondary | ICD-10-CM | POA: Diagnosis not present

## 2016-01-09 DIAGNOSIS — R509 Fever, unspecified: Secondary | ICD-10-CM | POA: Diagnosis not present

## 2016-01-09 DIAGNOSIS — I10 Essential (primary) hypertension: Secondary | ICD-10-CM | POA: Diagnosis present

## 2016-01-09 DIAGNOSIS — H9209 Otalgia, unspecified ear: Secondary | ICD-10-CM | POA: Diagnosis not present

## 2016-01-09 DIAGNOSIS — R05 Cough: Secondary | ICD-10-CM | POA: Diagnosis not present

## 2016-01-09 DIAGNOSIS — C787 Secondary malignant neoplasm of liver and intrahepatic bile duct: Secondary | ICD-10-CM

## 2016-01-09 DIAGNOSIS — E785 Hyperlipidemia, unspecified: Secondary | ICD-10-CM | POA: Diagnosis present

## 2016-01-09 DIAGNOSIS — Z87891 Personal history of nicotine dependence: Secondary | ICD-10-CM | POA: Diagnosis not present

## 2016-01-09 DIAGNOSIS — E872 Acidosis: Secondary | ICD-10-CM | POA: Diagnosis present

## 2016-01-09 DIAGNOSIS — E876 Hypokalemia: Secondary | ICD-10-CM | POA: Diagnosis not present

## 2016-01-09 DIAGNOSIS — E039 Hypothyroidism, unspecified: Secondary | ICD-10-CM | POA: Diagnosis present

## 2016-01-09 DIAGNOSIS — A419 Sepsis, unspecified organism: Secondary | ICD-10-CM | POA: Diagnosis not present

## 2016-01-09 DIAGNOSIS — D7589 Other specified diseases of blood and blood-forming organs: Secondary | ICD-10-CM | POA: Diagnosis present

## 2016-01-09 DIAGNOSIS — J90 Pleural effusion, not elsewhere classified: Secondary | ICD-10-CM | POA: Diagnosis not present

## 2016-01-09 DIAGNOSIS — R109 Unspecified abdominal pain: Secondary | ICD-10-CM | POA: Diagnosis not present

## 2016-01-09 DIAGNOSIS — C189 Malignant neoplasm of colon, unspecified: Secondary | ICD-10-CM

## 2016-01-09 DIAGNOSIS — Z9049 Acquired absence of other specified parts of digestive tract: Secondary | ICD-10-CM

## 2016-01-09 DIAGNOSIS — K8020 Calculus of gallbladder without cholecystitis without obstruction: Secondary | ICD-10-CM | POA: Diagnosis not present

## 2016-01-09 DIAGNOSIS — C182 Malignant neoplasm of ascending colon: Secondary | ICD-10-CM | POA: Diagnosis not present

## 2016-01-09 DIAGNOSIS — Z7982 Long term (current) use of aspirin: Secondary | ICD-10-CM

## 2016-01-09 DIAGNOSIS — Z79899 Other long term (current) drug therapy: Secondary | ICD-10-CM

## 2016-01-09 DIAGNOSIS — C7800 Secondary malignant neoplasm of unspecified lung: Secondary | ICD-10-CM

## 2016-01-09 DIAGNOSIS — R51 Headache: Secondary | ICD-10-CM | POA: Diagnosis not present

## 2016-01-09 DIAGNOSIS — E861 Hypovolemia: Secondary | ICD-10-CM | POA: Diagnosis present

## 2016-01-09 DIAGNOSIS — C799 Secondary malignant neoplasm of unspecified site: Secondary | ICD-10-CM

## 2016-01-09 DIAGNOSIS — D509 Iron deficiency anemia, unspecified: Secondary | ICD-10-CM | POA: Diagnosis not present

## 2016-01-09 DIAGNOSIS — E1165 Type 2 diabetes mellitus with hyperglycemia: Secondary | ICD-10-CM | POA: Diagnosis not present

## 2016-01-09 DIAGNOSIS — E440 Moderate protein-calorie malnutrition: Secondary | ICD-10-CM | POA: Diagnosis present

## 2016-01-09 DIAGNOSIS — E11649 Type 2 diabetes mellitus with hypoglycemia without coma: Secondary | ICD-10-CM | POA: Diagnosis present

## 2016-01-09 DIAGNOSIS — R911 Solitary pulmonary nodule: Secondary | ICD-10-CM

## 2016-01-09 DIAGNOSIS — R42 Dizziness and giddiness: Secondary | ICD-10-CM | POA: Diagnosis not present

## 2016-01-09 DIAGNOSIS — E1140 Type 2 diabetes mellitus with diabetic neuropathy, unspecified: Secondary | ICD-10-CM | POA: Diagnosis not present

## 2016-01-09 DIAGNOSIS — D649 Anemia, unspecified: Secondary | ICD-10-CM | POA: Diagnosis present

## 2016-01-09 DIAGNOSIS — R079 Chest pain, unspecified: Secondary | ICD-10-CM | POA: Diagnosis not present

## 2016-01-09 DIAGNOSIS — E871 Hypo-osmolality and hyponatremia: Secondary | ICD-10-CM

## 2016-01-09 DIAGNOSIS — R000 Tachycardia, unspecified: Secondary | ICD-10-CM | POA: Diagnosis not present

## 2016-01-09 DIAGNOSIS — E119 Type 2 diabetes mellitus without complications: Secondary | ICD-10-CM

## 2016-01-09 LAB — URINE MICROSCOPIC-ADD ON

## 2016-01-09 LAB — CBC
HEMATOCRIT: 31.5 % — AB (ref 36.0–46.0)
HEMOGLOBIN: 10.3 g/dL — AB (ref 12.0–15.0)
MCH: 26.4 pg (ref 26.0–34.0)
MCHC: 32.7 g/dL (ref 30.0–36.0)
MCV: 80.8 fL (ref 78.0–100.0)
Platelets: 453 10*3/uL — ABNORMAL HIGH (ref 150–400)
RBC: 3.9 MIL/uL (ref 3.87–5.11)
RDW: 14.9 % (ref 11.5–15.5)
WBC: 22.4 10*3/uL — ABNORMAL HIGH (ref 4.0–10.5)

## 2016-01-09 LAB — COMPREHENSIVE METABOLIC PANEL
ALK PHOS: 105 U/L (ref 38–126)
ALT: 17 U/L (ref 14–54)
AST: 60 U/L — ABNORMAL HIGH (ref 15–41)
Albumin: 3.1 g/dL — ABNORMAL LOW (ref 3.5–5.0)
Anion gap: 12 (ref 5–15)
BUN: 14 mg/dL (ref 6–20)
CALCIUM: 9.9 mg/dL (ref 8.9–10.3)
CO2: 23 mmol/L (ref 22–32)
CREATININE: 0.99 mg/dL (ref 0.44–1.00)
Chloride: 96 mmol/L — ABNORMAL LOW (ref 101–111)
GFR, EST NON AFRICAN AMERICAN: 55 mL/min — AB (ref >60)
Glucose, Bld: 119 mg/dL — ABNORMAL HIGH (ref 65–99)
Potassium: 3.4 mmol/L — ABNORMAL LOW (ref 3.5–5.1)
Sodium: 131 mmol/L — ABNORMAL LOW (ref 135–145)
Total Bilirubin: 1.3 mg/dL — ABNORMAL HIGH (ref 0.3–1.2)
Total Protein: 8.1 g/dL (ref 6.5–8.1)

## 2016-01-09 LAB — URINALYSIS, ROUTINE W REFLEX MICROSCOPIC
Glucose, UA: NEGATIVE mg/dL
HGB URINE DIPSTICK: NEGATIVE
Ketones, ur: NEGATIVE mg/dL
Nitrite: POSITIVE — AB
PH: 5.5 (ref 5.0–8.0)
Protein, ur: NEGATIVE mg/dL
SPECIFIC GRAVITY, URINE: 1.021 (ref 1.005–1.030)

## 2016-01-09 LAB — TROPONIN I

## 2016-01-09 LAB — I-STAT CG4 LACTIC ACID, ED
LACTIC ACID, VENOUS: 2.38 mmol/L — AB (ref 0.5–1.9)
LACTIC ACID, VENOUS: 0.96 mmol/L (ref 0.5–1.9)

## 2016-01-09 LAB — TSH: TSH: 0.296 u[IU]/mL — ABNORMAL LOW (ref 0.350–4.500)

## 2016-01-09 LAB — SEDIMENTATION RATE: Sed Rate: 140 mm/hr — ABNORMAL HIGH (ref 0–22)

## 2016-01-09 LAB — LIPASE, BLOOD: Lipase: 26 U/L (ref 11–51)

## 2016-01-09 MED ORDER — PIPERACILLIN-TAZOBACTAM 3.375 G IVPB
3.3750 g | Freq: Three times a day (TID) | INTRAVENOUS | Status: DC
  Administered 2016-01-09 – 2016-01-12 (×9): 3.375 g via INTRAVENOUS
  Filled 2016-01-09 (×7): qty 50

## 2016-01-09 MED ORDER — PIPERACILLIN-TAZOBACTAM 3.375 G IVPB 30 MIN
3.3750 g | Freq: Once | INTRAVENOUS | Status: AC
  Administered 2016-01-09: 3.375 g via INTRAVENOUS
  Filled 2016-01-09: qty 50

## 2016-01-09 MED ORDER — ACETAMINOPHEN 650 MG RE SUPP
650.0000 mg | Freq: Four times a day (QID) | RECTAL | Status: DC | PRN
Start: 2016-01-09 — End: 2016-01-13

## 2016-01-09 MED ORDER — SODIUM CHLORIDE 0.9% FLUSH
3.0000 mL | Freq: Two times a day (BID) | INTRAVENOUS | Status: DC
  Administered 2016-01-09 – 2016-01-12 (×2): 3 mL via INTRAVENOUS

## 2016-01-09 MED ORDER — ACETAMINOPHEN 325 MG PO TABS
650.0000 mg | ORAL_TABLET | Freq: Once | ORAL | Status: AC
  Administered 2016-01-09: 650 mg via ORAL
  Filled 2016-01-09 (×2): qty 2

## 2016-01-09 MED ORDER — ASPIRIN EC 81 MG PO TBEC
81.0000 mg | DELAYED_RELEASE_TABLET | Freq: Every day | ORAL | Status: DC
  Administered 2016-01-10 – 2016-01-13 (×4): 81 mg via ORAL
  Filled 2016-01-09 (×4): qty 1

## 2016-01-09 MED ORDER — ENALAPRIL-HYDROCHLOROTHIAZIDE 10-25 MG PO TABS: 1.0000 | ORAL_TABLET | Freq: Every day | ORAL | Status: DC

## 2016-01-09 MED ORDER — POTASSIUM CHLORIDE IN NACL 20-0.9 MEQ/L-% IV SOLN
INTRAVENOUS | Status: AC
  Administered 2016-01-09 – 2016-01-10 (×2): via INTRAVENOUS
  Filled 2016-01-09 (×2): qty 1000

## 2016-01-09 MED ORDER — GLIPIZIDE ER 10 MG PO TB24
10.0000 mg | ORAL_TABLET | Freq: Every day | ORAL | Status: DC
  Administered 2016-01-10: 10 mg via ORAL
  Filled 2016-01-09: qty 1

## 2016-01-09 MED ORDER — HYDROCHLOROTHIAZIDE 25 MG PO TABS: 25.0000 mg | ORAL_TABLET | Freq: Every day | ORAL | Status: DC

## 2016-01-09 MED ORDER — LEVOTHYROXINE SODIUM 150 MCG PO TABS
150.0000 ug | ORAL_TABLET | Freq: Every day | ORAL | Status: DC
  Filled 2016-01-09: qty 1

## 2016-01-09 MED ORDER — ENOXAPARIN SODIUM 40 MG/0.4ML ~~LOC~~ SOLN
40.0000 mg | Freq: Every day | SUBCUTANEOUS | Status: DC
  Administered 2016-01-09 – 2016-01-12 (×4): 40 mg via SUBCUTANEOUS
  Filled 2016-01-09 (×4): qty 0.4

## 2016-01-09 MED ORDER — LEVOTHYROXINE SODIUM 50 MCG PO TABS
150.0000 ug | ORAL_TABLET | Freq: Every day | ORAL | Status: DC
  Administered 2016-01-10 – 2016-01-13 (×4): 150 ug via ORAL
  Filled 2016-01-09 (×4): qty 1

## 2016-01-09 MED ORDER — AMLODIPINE BESYLATE 10 MG PO TABS: 10.0000 mg | ORAL_TABLET | Freq: Every day | ORAL | Status: DC

## 2016-01-09 MED ORDER — VANCOMYCIN HCL IN DEXTROSE 1-5 GM/200ML-% IV SOLN
1000.0000 mg | Freq: Once | INTRAVENOUS | Status: AC
  Administered 2016-01-09: 1000 mg via INTRAVENOUS
  Filled 2016-01-09: qty 200

## 2016-01-09 MED ORDER — ENALAPRIL MALEATE 10 MG PO TABS
10.0000 mg | ORAL_TABLET | Freq: Every day | ORAL | Status: DC
  Filled 2016-01-09: qty 1

## 2016-01-09 MED ORDER — VANCOMYCIN HCL IN DEXTROSE 750-5 MG/150ML-% IV SOLN
750.0000 mg | Freq: Two times a day (BID) | INTRAVENOUS | Status: DC
  Administered 2016-01-10 – 2016-01-11 (×4): 750 mg via INTRAVENOUS
  Filled 2016-01-09 (×5): qty 150

## 2016-01-09 MED ORDER — ACETAMINOPHEN 325 MG PO TABS
650.0000 mg | ORAL_TABLET | Freq: Four times a day (QID) | ORAL | Status: DC | PRN
  Administered 2016-01-10 – 2016-01-12 (×2): 650 mg via ORAL
  Filled 2016-01-09 (×2): qty 2

## 2016-01-09 MED ORDER — SODIUM CHLORIDE 0.9 % IV BOLUS (SEPSIS)
1000.0000 mL | Freq: Once | INTRAVENOUS | Status: AC
  Administered 2016-01-09: 1000 mL via INTRAVENOUS

## 2016-01-09 MED ORDER — SODIUM CHLORIDE 0.9 % IV BOLUS (SEPSIS): 1000.0000 mL | Freq: Once | INTRAVENOUS | Status: DC

## 2016-01-09 MED ORDER — LORATADINE 10 MG PO TABS: 10.0000 mg | ORAL_TABLET | Freq: Every day | ORAL | Status: DC | PRN

## 2016-01-09 MED ORDER — ONDANSETRON HCL 4 MG/2ML IJ SOLN
4.0000 mg | Freq: Four times a day (QID) | INTRAMUSCULAR | Status: DC | PRN
Start: 2016-01-09 — End: 2016-01-13

## 2016-01-09 MED ORDER — INSULIN ASPART 100 UNIT/ML ~~LOC~~ SOLN
0.0000 [IU] | Freq: Three times a day (TID) | SUBCUTANEOUS | Status: DC
  Administered 2016-01-11: 1 [IU] via SUBCUTANEOUS
  Administered 2016-01-12: 3 [IU] via SUBCUTANEOUS
  Administered 2016-01-12: 1 [IU] via SUBCUTANEOUS
  Administered 2016-01-13: 3 [IU] via SUBCUTANEOUS
  Administered 2016-01-13: 2 [IU] via SUBCUTANEOUS

## 2016-01-09 MED ORDER — FLUTICASONE PROPIONATE 50 MCG/ACT NA SUSP
2.0000 | Freq: Every day | NASAL | Status: DC
  Administered 2016-01-10 – 2016-01-13 (×4): 2 via NASAL
  Filled 2016-01-09: qty 16

## 2016-01-09 NOTE — ED Notes (Signed)
Patient transported to X-ray 

## 2016-01-09 NOTE — H&P (Addendum)
TRH H&P   Patient Demographics:    Judith Stephens, is a 75 y.o. female  MRN: [id number]   DOB - [birth year]  Admit Date - 01/09/2016  Outpatient Primary MD for the patient is Donnie Coffin, MD  Referring MD/NP/PA: Sherwood Gambler  Outpatient Specialists:   Patient coming from: home  Chief Complaint  Patient presents with  . Dizziness  . Otalgia  . URI      HPI:    Judith Stephens  is a 75 y.o. female, w/ Hypertension, Hyperlipidemia, Hypothyroidism apparently c/o felt lightheaded for the past 3 weeks.  Today had feeling of generalized weakness and had temp last nite of 102 .  Pt notes abdominal discomfort, right sided, and also n/v x3 weeks.  Pt denies diarrhea. Pt presented to  ED for evaluation.   In Ed, pt noted to be hyponatremic Na=131, and hypokalemic K=3.4.  Pt had abnormal Ast =60.   Pt had wbc 22, and will be admitted for ? Sepsis and abnormal lft.    Review of systems:    In addition to the HPI above,   No Headache, No changes with Vision or hearing, No problems swallowing food or Liquids, No Chest pain, Cough or Shortness of Breath, No Abdominal pain, + Nausea or Vommitting  Intermittently x3 weeks. + Gerd,  No bloody emesis, Bowel movements are regular, No Blood in stool or Urine, No dysuria,  + frequency of urination, + right ear pain No new skin rashes or bruises, No new joints pains-aches,  No  tingling, numbness in any extremity, No recent weight gain or loss, No polyuria, polydypsia or polyphagia, No significant Mental Stressors.  A full 10 point Review of Systems was done, except as stated above, all other Review of Systems were negative.   With Past History of the following :    Past Medical History:  Diagnosis Date  . Anemia    hx of blood transfusion 03/06/12   . Arthritis   . Blood transfusion   . Diabetes mellitus   . Hyperlipidemia   .  Hypertension   . Hypothyroidism       Past Surgical History:  Procedure Laterality Date  . LAPAROSCOPY  03/14/2012   Procedure: LAPAROSCOPY DIAGNOSTIC;  Surgeon: Adin Hector, MD;  Location: [facility];  Service: General;;  right colectomy  . TUBAL LIGATION        Social History:     Social History  Substance Use Topics  . Smoking status: Former Research scientist (life sciences)  . Smokeless tobacco: Never Used  . Alcohol use No     Comment: occasional wine      Lives - at home  Mobility - ambulates without assistance.      Family History :     Family History  Problem Relation Age of Onset  . Hypertension Father   . Hypertension Sister     x3  .  Hypertension Brother       Home Medications:   Prior to Admission medications   Medication Sig Start Date End Date Taking? Authorizing Provider  amLODipine (NORVASC) 10 MG tablet Take 10 mg by mouth daily.    Yes Historical Provider, MD  aspirin EC 81 MG tablet Take 81 mg by mouth daily.    Yes Historical Provider, MD  enalapril-hydrochlorothiazide (VASERETIC) 10-25 MG per tablet Take 1 tablet by mouth daily.    Yes Historical Provider, MD  fluticasone (FLONASE) 50 MCG/ACT nasal spray Place 2 sprays into the nose daily as needed for rhinitis. For allergies   Yes Historical Provider, MD  GLIPIZIDE XL 10 MG 24 hr tablet Take 10 mg by mouth daily with breakfast.    Yes Historical Provider, MD  levothyroxine (SYNTHROID, LEVOTHROID) 150 MCG tablet Take 150 mcg by mouth daily before breakfast.   Yes Historical Provider, MD  loratadine (CLARITIN) 10 MG tablet Take 10 mg by mouth daily as needed for allergies.    Yes Historical Provider, MD  rosuvastatin (CRESTOR) 10 MG tablet Take 10 mg by mouth every Monday, Wednesday, and Friday.    Yes Historical Provider, MD     Allergies:    No Known Allergies   Physical Exam:   Vitals  Blood pressure 101/64, pulse 96, temperature (S) 99.5 F (37.5 C), temperature source (S) Oral, resp. rate 18, height 5'  10" (1.778 m), weight 85.3 kg (188 lb), SpO2 96 %.   1. General  lying in bed in NAD,    2. Normal affect and insight, Not Suicidal or Homicidal, Awake Alert, Oriented X 3.  3. No F.N deficits, ALL C.Nerves Intact, Strength 5/5 all 4 extremities, Sensation intact all 4 extremities, Plantars down going.  4. Ears and Eyes appear Normal, Conjunctivae clear, PERRLA. Moist Oral Mucosa.  5. Supple Neck, No JVD, No cervical lymphadenopathy appriciated, No Carotid Bruits.  6. Symmetrical Chest wall movement, Good air movement bilaterally, CTAB.  7. RRR, No Gallops, Rubs or Murmurs, No Parasternal Heave.  8. Positive Bowel Sounds, Abdomen Soft, No tenderness, No organomegaly appriciated,No rebound -guarding or rigidity.  9.  No Cyanosis, Normal Skin Turgor, No Skin Rash or Bruise.  No CVA tenderness  10. Good muscle tone,  joints appear normal , no effusions, Normal ROM.  11. No Palpable Lymph Nodes in Neck or Axillae    Data Review:    CBC  Recent Labs Lab 01/09/16 1700  WBC 22.4*  HGB 10.3*  HCT 31.5*  PLT 453*  MCV 80.8  MCH 26.4  MCHC 32.7  RDW 14.9   ------------------------------------------------------------------------------------------------------------------  Chemistries   Recent Labs Lab 01/09/16 1700  NA 131*  K 3.4*  CL 96*  CO2 23  GLUCOSE 119*  BUN 14  CREATININE 0.99  CALCIUM 9.9  AST 60*  ALT 17  ALKPHOS 105  BILITOT 1.3*   ------------------------------------------------------------------------------------------------------------------ estimated creatinine clearance is 59.2 mL/min (by C-G formula based on SCr of 0.99 mg/dL). ------------------------------------------------------------------------------------------------------------------ No results for input(s): TSH, T4TOTAL, T3FREE, THYROIDAB in the last 72 hours.  Invalid input(s): FREET3  Coagulation profile No results for input(s): INR, PROTIME in the last 168  hours. ------------------------------------------------------------------------------------------------------------------- No results for input(s): DDIMER in the last 72 hours. -------------------------------------------------------------------------------------------------------------------  Cardiac Enzymes No results for input(s): CKMB, TROPONINI, MYOGLOBIN in the last 168 hours.  Invalid input(s): CK ------------------------------------------------------------------------------------------------------------------ No results found for: BNP   ---------------------------------------------------------------------------------------------------------------  Urinalysis    Component Value Date/Time   COLORURINE ORANGE (A) 01/09/2016 1954  APPEARANCEUR HAZY (A) 01/09/2016 1954   LABSPEC 1.021 01/09/2016 1954   PHURINE 5.5 01/09/2016 1954   GLUCOSEU NEGATIVE 01/09/2016 1954   HGBUR NEGATIVE 01/09/2016 1954   BILIRUBINUR SMALL (A) 01/09/2016 1954   KETONESUR NEGATIVE 01/09/2016 1954   PROTEINUR NEGATIVE 01/09/2016 1954   NITRITE POSITIVE (A) 01/09/2016 1954   LEUKOCYTESUR SMALL (A) 01/09/2016 1954    ----------------------------------------------------------------------------------------------------------------   Imaging Results:    Dg Chest 2 View  Result Date: 01/09/2016 CLINICAL DATA:  75 year old female with cough and right chest pain. EXAM: CHEST  2 VIEW COMPARISON:  03/05/2012 abdominal CT FINDINGS: Cardiomegaly noted. Elevation of the right hemidiaphragm again noted. Mild bibasilar atelectasis is present. There is no evidence of focal airspace disease, pulmonary edema, suspicious pulmonary nodule/mass, pleural effusion, or pneumothorax. No acute bony abnormalities are identified. IMPRESSION: Cardiomegaly with mild bibasilar atelectasis. Electronically Signed   By: Margarette Canada M.D.   On: 01/09/2016 19:26  Ct Head Wo Contrast  Result Date: 01/09/2016 CLINICAL DATA:  75 year old  female with dizziness and headache for several days. EXAM: CT HEAD WITHOUT CONTRAST TECHNIQUE: Contiguous axial images were obtained from the base of the skull through the vertex without intravenous contrast. COMPARISON:  None. FINDINGS: Mild generalized cerebral volume loss noted. No acute intracranial abnormalities are identified, including mass lesion or mass effect, hydrocephalus, extra-axial fluid collection, midline shift, hemorrhage, or acute infarction. The visualized bony calvarium is unremarkable. IMPRESSION: No evidence of acute intracranial abnormality. Mild atrophy. Electronically Signed   By: Margarette Canada M.D.   On: 01/09/2016 19:28      Assessment & Plan:    Principal Problem:   Sepsis ([place]) Active Problems:   Anemia   Diabetes mellitus ([facility])   Hyponatremia   Hypokalemia    1. Leukocytosis/ Fever ? Sepsis Unclear source Check esr, await blood culture Awaiting CT scan abd/ pelvis If sed rate high, consider cardiac echo Cont vanco, zosyn  2.  Abdominal pain , ruq, /  N/v zofran Check CT scan abd/pelvis  3. Hyponatremia Check serum osm, tsh, cortisol, urine sodium, urine osm If continues to be an issue consider changing bp medication,  And d/c hydrochlorothiazide  4. Hypokalemia Replete  Check cmp in am  5. Anemia Check cbc in am Consider ferritin, iron, tibc, b12, folate, spep, upep if worsening  6. Dm2 fsbs q4h, iss  7. Abnormal lft Hold crestor   DVT Prophylaxis Lovenox - SCDs   AM Labs Ordered, also please review Full Orders  Family Communication: Admission, patients condition and plan of care including tests being ordered have been discussed with the patient  who indicate understanding and agree with the plan and Code Status.  Code Status FULL CODE  Likely DC to  home  Condition GUARDED    Consults called:   Admission status: inpatient  Time spent in minutes : 45 minutes   Jani Gravel M.D on 01/09/2016 at 8:54 PM  Between 7am to 7pm -  Pager - [phone] After 7pm go to [email or web address] - password [facility]  [facility] - Office  [phone]

## 2016-01-09 NOTE — ED Notes (Signed)
Lactic Acid= 2.38, EDP [place] notified

## 2016-01-09 NOTE — ED Triage Notes (Addendum)
Per EMS, Pt, from home, c/o nasal congestion, R ear pain, dizziness, and RUQ pain x 3 weeks and n/v x 1 day.  Sts symptoms were intermittent until 2 days ago.  Pt has been seen by PCP x 2 for same and diagnosed w/ an URI and anemia.

## 2016-01-09 NOTE — Progress Notes (Signed)
Pharmacy Antibiotic Note  Judith Stephens is a 75 y.o. female admitted on 01/09/2016 with c/o dizziness, ear pain, cough with sputum production, and fever. Lactic acid is elevated. Pharmacy is asked to dose Vanc and Zosyn for sepsis. First doses ordered in the ED.  Plan: Vanc 1g x 1 then 750mg  q12h. Zosyn 3.375g IV Q8H infused over 4hrs. Measure Vanc trough at steady state. Follow up renal fxn, culture results, and clinical course.  Height: 5\' 10"  (177.8 cm) Weight: 188 lb (85.3 kg) IBW/kg (Calculated) : 68.5  Temp (24hrs), Avg:101.3 F (38.5 C), Min:100.3 F (37.9 C), Max:103.2 F (39.6 C)   Recent Labs Lab 01/09/16 1700 01/09/16 1830  WBC 22.4*  --   CREATININE 0.99  --   LATICACIDVEN  --  2.38*    Estimated Creatinine Clearance: 59.2 mL/min (by C-G formula based on SCr of 0.99 mg/dL).    No Known Allergies  Antimicrobials this admission: Vanc 7/23 >> Zosyn 7/23 >>  Dose adjustments this admission:  Microbiology results: 7/23 BCx: pending 7/23 UCx: pending  Thank you for allowing pharmacy to be a part of this patient's care.  Romeo Rabon, PharmD, pager [phone]. 01/09/2016,7:11 PM.

## 2016-01-09 NOTE — ED Provider Notes (Signed)
[facility] DEPT Provider Note   CSN: [id number] Arrival date & time: 01/09/16  [id number]  First Provider Contact:  First MD Initiated Contact with Patient 01/09/16 1753        History   Chief Complaint Chief Complaint  Patient presents with  . Dizziness  . Otalgia  . URI    HPI Judith Stephens is a 75 y.o. female presenting with a chief complaint of dizziness. Dizziness has been present for about 3 weeks. Comes and goes. Often happens when standing but can happen while at rest as well. Feels both like a room spinning as well as like she's going to pass out. Has intermittent headaches, none now. These do not always come with the dizziness. Has also been having intermittent right ear pain although none now either. 3 days ago the dizziness worsened and has become intolerable. Developed a fever of 102 last night. Has had a cough for 3 weeks as well as right side pain in her ribs. Mild sputum production. Denies urinary symptoms. No current headache or neck stiffness.  HPI  Past Medical History:  Diagnosis Date  . Anemia    hx of blood transfusion 03/06/12   . Arthritis   . Blood transfusion   . Diabetes mellitus   . Hyperlipidemia   . Hypertension   . Hypothyroidism     Patient Active Problem List   Diagnosis Date Noted  . Sepsis ([place]) 01/09/2016  . Incisional hernia 03/08/2012  . Anemia 03/06/2012  . Colon cancer ([place]) 03/06/2012  . Diabetes mellitus ([place]) 03/06/2012  . HTN (hypertension) 03/06/2012    Past Surgical History:  Procedure Laterality Date  . LAPAROSCOPY  03/14/2012   Procedure: LAPAROSCOPY DIAGNOSTIC;  Surgeon: Adin Hector, MD;  Location: [facility];  Service: General;;  right colectomy  . OOPHORECTOMY    . TUBAL LIGATION      OB History    No data available       Home Medications    Prior to Admission medications   Medication Sig Start Date End Date Taking? Authorizing Provider  amLODipine (NORVASC) 10 MG tablet Take 10 mg by mouth daily.    Yes  Historical Provider, MD  aspirin EC 81 MG tablet Take 81 mg by mouth daily.    Yes Historical Provider, MD  enalapril-hydrochlorothiazide (VASERETIC) 10-25 MG per tablet Take 1 tablet by mouth daily.    Yes Historical Provider, MD  fluticasone (FLONASE) 50 MCG/ACT nasal spray Place 2 sprays into the nose daily as needed for rhinitis. For allergies   Yes Historical Provider, MD  GLIPIZIDE XL 10 MG 24 hr tablet Take 10 mg by mouth daily with breakfast.    Yes Historical Provider, MD  levothyroxine (SYNTHROID, LEVOTHROID) 150 MCG tablet Take 150 mcg by mouth daily before breakfast.   Yes Historical Provider, MD  loratadine (CLARITIN) 10 MG tablet Take 10 mg by mouth daily as needed for allergies.    Yes Historical Provider, MD  rosuvastatin (CRESTOR) 10 MG tablet Take 10 mg by mouth every Monday, Wednesday, and Friday.    Yes Historical Provider, MD    Family History Family History  Problem Relation Age of Onset  . Hypertension Father   . Hypertension Sister     x3  . Hypertension Brother     Social History Social History  Substance Use Topics  . Smoking status: Former Research scientist (life sciences)  . Smokeless tobacco: Never Used  . Alcohol use Yes     Comment: occasional wine  Allergies   Review of patient's allergies indicates no known allergies.   Review of Systems Review of Systems  Constitutional: Positive for fever.  HENT: Positive for ear pain.   Respiratory: Positive for cough. Negative for shortness of breath.   Cardiovascular: Negative for chest pain.  Gastrointestinal: Negative for abdominal pain, nausea and vomiting.  Genitourinary: Negative for dysuria.  Neurological: Positive for dizziness, light-headedness and headaches. Negative for syncope and weakness.  All other systems reviewed and are negative.    Physical Exam Updated Vital Signs BP 101/64   Pulse 96   Temp (S) 99.5 F (37.5 C) (Oral)   Resp 18   Ht 5\' 10"  (1.778 m)   Wt 188 lb (85.3 kg)   SpO2 96%   BMI 26.98  kg/m   Physical Exam  Constitutional: She is oriented to person, place, and time. She appears well-developed and well-nourished.  HENT:  Head: Normocephalic and atraumatic.  Right Ear: External ear normal.  Left Ear: External ear normal.  Nose: Nose normal.  Eyes: EOM are normal. Pupils are equal, round, and reactive to light. Right eye exhibits no discharge. Left eye exhibits no discharge. Right eye exhibits no nystagmus. Left eye exhibits no nystagmus.  Neck: Normal range of motion. Neck supple.  No meningismus  Cardiovascular: Regular rhythm and normal heart sounds.  Tachycardia present.   No obvious murmur  Pulmonary/Chest: Effort normal and breath sounds normal.  Abdominal: Soft. There is no tenderness.  Neurological: She is alert and oriented to person, place, and time.  CN 3-12 grossly intact. 5/5 strength in all 4 extremities. Grossly normal sensation. Is shaky when doing finger to nose but hits nose and finger without difficulty.   Skin: Skin is warm and dry.  Nursing note and vitals reviewed.    ED Treatments / Results  Labs (all labs ordered are listed, but only abnormal results are displayed) Labs Reviewed  CBC - Abnormal; Notable for the following:       Result Value   WBC 22.4 (*)    Hemoglobin 10.3 (*)    HCT 31.5 (*)    Platelets 453 (*)    All other components within normal limits  URINALYSIS, ROUTINE W REFLEX MICROSCOPIC (NOT AT [facility]) - Abnormal; Notable for the following:    Color, Urine ORANGE (*)    APPearance HAZY (*)    Bilirubin Urine SMALL (*)    Nitrite POSITIVE (*)    Leukocytes, UA SMALL (*)    All other components within normal limits  COMPREHENSIVE METABOLIC PANEL - Abnormal; Notable for the following:    Sodium 131 (*)    Potassium 3.4 (*)    Chloride 96 (*)    Glucose, Bld 119 (*)    Albumin 3.1 (*)    AST 60 (*)    Total Bilirubin 1.3 (*)    GFR calc non Af Amer 55 (*)    All other components within normal limits  URINE  MICROSCOPIC-ADD ON - Abnormal; Notable for the following:    Squamous Epithelial / LPF 0-5 (*)    Bacteria, UA RARE (*)    Casts HYALINE CASTS (*)    All other components within normal limits  I-STAT CG4 LACTIC ACID, ED - Abnormal; Notable for the following:    Lactic Acid, Venous 2.38 (*)    All other components within normal limits  CULTURE, BLOOD (ROUTINE X 2)  CULTURE, BLOOD (ROUTINE X 2)  URINE CULTURE  LIPASE, BLOOD  I-STAT CG4 LACTIC ACID,  ED  I-STAT CG4 LACTIC ACID, ED    EKG  EKG Interpretation  Date/Time:  Sunday January 09 2016 16:02:42 EDT Ventricular Rate:  113 PR Interval:    QRS Duration: 86 QT Interval:  324 QTC Calculation: 445 R Axis:   -86 Text Interpretation:  Sinus tachycardia Left anterior fascicular block Abnormal R-wave progression, late transition Confirmed by Kache Mcclurg MD, Lekeisha Arenas [phone]) on 01/09/2016 6:09:02 PM       Radiology Dg Chest 2 View  Result Date: 01/09/2016 CLINICAL DATA:  75 year old female with cough and right chest pain. EXAM: CHEST  2 VIEW COMPARISON:  03/05/2012 abdominal CT FINDINGS: Cardiomegaly noted. Elevation of the right hemidiaphragm again noted. Mild bibasilar atelectasis is present. There is no evidence of focal airspace disease, pulmonary edema, suspicious pulmonary nodule/mass, pleural effusion, or pneumothorax. No acute bony abnormalities are identified. IMPRESSION: Cardiomegaly with mild bibasilar atelectasis. Electronically Signed   By: Margarette Canada M.D.   On: 01/09/2016 19:26  Ct Head Wo Contrast  Result Date: 01/09/2016 CLINICAL DATA:  75 year old female with dizziness and headache for several days. EXAM: CT HEAD WITHOUT CONTRAST TECHNIQUE: Contiguous axial images were obtained from the base of the skull through the vertex without intravenous contrast. COMPARISON:  None. FINDINGS: Mild generalized cerebral volume loss noted. No acute intracranial abnormalities are identified, including mass lesion or mass effect, hydrocephalus,  extra-axial fluid collection, midline shift, hemorrhage, or acute infarction. The visualized bony calvarium is unremarkable. IMPRESSION: No evidence of acute intracranial abnormality. Mild atrophy. Electronically Signed   By: Margarette Canada M.D.   On: 01/09/2016 19:28   Procedures Procedures (including critical care time)  Medications Ordered in ED Medications  piperacillin-tazobactam (ZOSYN) IVPB 3.375 g (not administered)  vancomycin (VANCOCIN) IVPB 750 mg/150 ml premix (not administered)  acetaminophen (TYLENOL) tablet 650 mg (650 mg Oral Given 01/09/16 1846)  sodium chloride 0.9 % bolus 1,000 mL (0 mLs Intravenous Stopped 01/09/16 1955)    And  sodium chloride 0.9 % bolus 1,000 mL (0 mLs Intravenous Stopped 01/09/16 1956)    And  sodium chloride 0.9 % bolus 1,000 mL (1,000 mLs Intravenous New Bag/Given 01/09/16 2004)  piperacillin-tazobactam (ZOSYN) IVPB 3.375 g (0 g Intravenous Stopped 01/09/16 1957)  vancomycin (VANCOCIN) IVPB 1000 mg/200 mL premix (0 mg Intravenous Stopped 01/09/16 1957)     Initial Impression / Assessment and Plan / ED Course  I have reviewed the triage vital signs and the nursing notes.  Pertinent labs & imaging results that were available during my care of the patient were reviewed by me and considered in my medical decision making (see chart for details).  Clinical Course  Comment By Time  Patient is not in distress. Is tachycardic, likely at least partially from fever. Initially low BP in triage but now normal. Will give fluids, give tylenol, and get urine and CXR. Send lactate, if high will treat as sepsis.  Sherwood Gambler, MD 07/23 1804  Rectal temp 103. Will call code sepsis. Probably pulmonary but with many vague symptoms will treat broadly with vanc/zosyn for unknown source with IV fluids per sepsis protocol Sherwood Gambler, MD 07/23 1817  Repeat sepsis assessment completed.  Sherwood Gambler, MD 07/23 2037  Patient's HR is now normal in 90s. BP stable. Feels  better. Urine with nitrites/small leukocytes although microscopic exam is unremarkable. Will continue broad antibiotics, admit to hospitalist. Sherwood Gambler, MD 07/23 2038    Patient is improved. Continue sepsis management. Possibly PNA (atelectasis with cough and fever) or UTI. Highly doubt meningitis  with no stiff neck or AMS. Dr. Maudie Mercury to admit to tele obs.  Final Clinical Impressions(s) / ED Diagnoses   Final diagnoses:  Sepsis, due to unspecified organism [facility])    New Prescriptions New Prescriptions   No medications on file     Sherwood Gambler, MD 01/09/16 2042

## 2016-01-10 ENCOUNTER — Inpatient Hospital Stay (HOSPITAL_COMMUNITY): Payer: Medicare Other

## 2016-01-10 ENCOUNTER — Encounter (HOSPITAL_COMMUNITY): Payer: Self-pay | Admitting: Radiology

## 2016-01-10 DIAGNOSIS — E1140 Type 2 diabetes mellitus with diabetic neuropathy, unspecified: Secondary | ICD-10-CM

## 2016-01-10 DIAGNOSIS — Z794 Long term (current) use of insulin: Secondary | ICD-10-CM

## 2016-01-10 DIAGNOSIS — G9340 Encephalopathy, unspecified: Secondary | ICD-10-CM

## 2016-01-10 LAB — GLUCOSE, CAPILLARY
GLUCOSE-CAPILLARY: 85 mg/dL (ref 65–99)
GLUCOSE-CAPILLARY: 37 mg/dL — AB (ref 65–99)
GLUCOSE-CAPILLARY: 122 mg/dL — AB (ref 65–99)
GLUCOSE-CAPILLARY: 63 mg/dL — AB (ref 65–99)
Glucose-Capillary: 55 mg/dL — ABNORMAL LOW (ref 65–99)
Glucose-Capillary: 55 mg/dL — ABNORMAL LOW (ref 65–99)
Glucose-Capillary: 72 mg/dL (ref 65–99)
Glucose-Capillary: 75 mg/dL (ref 65–99)
Glucose-Capillary: 81 mg/dL (ref 65–99)
Glucose-Capillary: 88 mg/dL (ref 65–99)

## 2016-01-10 LAB — COMPREHENSIVE METABOLIC PANEL
ALBUMIN: 2.6 g/dL — AB (ref 3.5–5.0)
ALK PHOS: 88 U/L (ref 38–126)
ALT: 14 U/L (ref 14–54)
ANION GAP: 9 (ref 5–15)
AST: 46 U/L — AB (ref 15–41)
BUN: 12 mg/dL (ref 6–20)
CALCIUM: 8.9 mg/dL (ref 8.9–10.3)
CO2: 21 mmol/L — AB (ref 22–32)
CREATININE: 0.64 mg/dL (ref 0.44–1.00)
Chloride: 102 mmol/L (ref 101–111)
GFR calc Af Amer: >60 mL/min (ref >60)
GFR calc non Af Amer: >60 mL/min (ref >60)
GLUCOSE: 90 mg/dL (ref 65–99)
Potassium: 3.5 mmol/L (ref 3.5–5.1)
SODIUM: 132 mmol/L — AB (ref 135–145)
Total Bilirubin: 1.1 mg/dL (ref 0.3–1.2)
Total Protein: 7.2 g/dL (ref 6.5–8.1)

## 2016-01-10 LAB — CBC WITH DIFFERENTIAL/PLATELET
BASOS PCT: 0 %
Basophils Absolute: 0 10*3/uL (ref 0.0–0.1)
Eosinophils Absolute: 0 10*3/uL (ref 0.0–0.7)
Eosinophils Relative: 0 %
HEMATOCRIT: 27.8 % — AB (ref 36.0–46.0)
Hemoglobin: 9 g/dL — ABNORMAL LOW (ref 12.0–15.0)
Lymphocytes Relative: 18 %
Lymphs Abs: 3.3 10*3/uL (ref 0.7–4.0)
MCH: 26.4 pg (ref 26.0–34.0)
MCHC: 32.4 g/dL (ref 30.0–36.0)
MCV: 81.5 fL (ref 78.0–100.0)
MONO ABS: 2.3 10*3/uL — AB (ref 0.1–1.0)
MONOS PCT: 12 %
NEUTROS ABS: 13 10*3/uL — AB (ref 1.7–7.7)
Neutrophils Relative %: 70 %
Platelets: 417 10*3/uL — ABNORMAL HIGH (ref 150–400)
RBC: 3.41 MIL/uL — ABNORMAL LOW (ref 3.87–5.11)
RDW: 15 % (ref 11.5–15.5)
WBC: 18.7 10*3/uL — ABNORMAL HIGH (ref 4.0–10.5)

## 2016-01-10 LAB — OSMOLALITY, URINE: OSMOLALITY UR: 399 mosm/kg (ref 300–900)

## 2016-01-10 LAB — SODIUM, URINE, RANDOM: SODIUM UR: 13 mmol/L

## 2016-01-10 MED ORDER — DIATRIZOATE MEGLUMINE & SODIUM 66-10 % PO SOLN: 15.0000 mL | ORAL | Status: DC | PRN

## 2016-01-10 MED ORDER — INSULIN ASPART 100 UNIT/ML ~~LOC~~ SOLN: 0.0000 [IU] | Freq: Three times a day (TID) | SUBCUTANEOUS | Status: DC

## 2016-01-10 MED ORDER — INSULIN ASPART 100 UNIT/ML ~~LOC~~ SOLN: 0.0000 [IU] | Freq: Every day | SUBCUTANEOUS | Status: DC

## 2016-01-10 MED ORDER — DEXTROSE 50 % IV SOLN
INTRAVENOUS | Status: AC
  Administered 2016-01-10: 50 mL
  Filled 2016-01-10: qty 50

## 2016-01-10 MED ORDER — INSULIN ASPART 100 UNIT/ML ~~LOC~~ SOLN: 4.0000 [IU] | Freq: Three times a day (TID) | SUBCUTANEOUS | Status: DC

## 2016-01-10 MED ORDER — IOPAMIDOL (ISOVUE-300) INJECTION 61%
100.0000 mL | Freq: Once | INTRAVENOUS | Status: AC | PRN
  Administered 2016-01-10: 100 mL via INTRAVENOUS

## 2016-01-10 NOTE — Progress Notes (Addendum)
CBG 37  , pt denied dizziness weakness. Dextrose 50 administered. Will repeat CBG. Result reported to primary RN.

## 2016-01-10 NOTE — Progress Notes (Signed)
[facility] PROGRESS NOTE    Progress Note  Judith Stephens  [id number] DOB: 1941/03/23 DOA: 01/09/2016 PCP: Donnie Coffin, MD     Brief Narrative:   Judith Stephens is an 75 y.o. female past medical history of essential hypertension hypothyroidism the came into the hospital complaining of lightheadedness for the past 3 weeks, generalized weakness the day of admission with a temperature of 102. Patient noted some abdominal discomfort  Assessment/Plan:   Sepsis ([place]) Of unclear source: Unclear source, she was treated aggressively with IV fluid hydration. We shall continue his morning. Lactic acidosis resolve, she was started empirically on IV vancomycin and Zosyn she continues to spike fever, but if her leukocytosis is slowly improving. DC antihypertensive medications. She has a mild elevation of her AST and her bilirubin, which are slowly him proving compared to yesterday and her other LFTs are normal, she did complain this morning about some abdominal pain Percell Miller sign is negative CT scan with contrast of the abdomen and pelvis is pending.  Acute encephalopathy: Likely due to infectious etiology now improved. The patient can provide a good history. Her sodium is mildly low but I doubt this continuing to her encephalopathy I have personally reviewed the CT of the head shows no acute finding just mild atrophy.  Normocytic  Anemia: Her last hemoglobin in 2014 was 13 on admission it was 10, now today is 9. There is likely a degree of hemoconcentration, she doesn't remember when her last colonoscopy was, she denies any overt bleeding. We'll continue to monitor hemoglobin closely.  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  Hyponatremia Urinary sodium is 13 likely hypovolemia continue aggressive IV fluid hydration.  Hypokalemia Result IV hydration and supplementation.  Moderate protein caloric malnutrition: Ensure 3 times a  day.  Thrombocytosis: Likely acting as an acute phase reactant.  DVT prophylaxis: Lovenox Family Communication:none Disposition Plan/Barrier to D/C: Unable to determine Code Status:     Code Status Orders        Start     Ordered   01/09/16 2207  Full code  Continuous     01/09/16 2206    Code Status History    Date Active Date Inactive Code Status Order ID Comments User Context   03/06/2012  6:12 AM 03/07/2012 12:25 AM Full Code [id number]  Etta Quill, DO ED        IV Access:    Peripheral IV   Procedures and diagnostic studies:   Dg Chest 2 View  Result Date: 01/09/2016 CLINICAL DATA:  75 year old female with cough and right chest pain. EXAM: CHEST  2 VIEW COMPARISON:  03/05/2012 abdominal CT FINDINGS: Cardiomegaly noted. Elevation of the right hemidiaphragm again noted. Mild bibasilar atelectasis is present. There is no evidence of focal airspace disease, pulmonary edema, suspicious pulmonary nodule/mass, pleural effusion, or pneumothorax. No acute bony abnormalities are identified. IMPRESSION: Cardiomegaly with mild bibasilar atelectasis. Electronically Signed   By: Margarette Canada M.D.   On: 01/09/2016 19:26  Ct Head Wo Contrast  Result Date: 01/09/2016 CLINICAL DATA:  75 year old female with dizziness and headache for several days. EXAM: CT HEAD WITHOUT CONTRAST TECHNIQUE: Contiguous axial images were obtained from the base of the skull through the vertex without intravenous contrast. COMPARISON:  None. FINDINGS: Mild generalized cerebral volume loss noted. No acute intracranial abnormalities are identified, including mass lesion or mass effect, hydrocephalus, extra-axial fluid collection, midline shift, hemorrhage, or acute infarction. The visualized bony calvarium is unremarkable.  IMPRESSION: No evidence of acute intracranial abnormality. Mild atrophy. Electronically Signed   By: Margarette Canada M.D.   On: 01/09/2016 19:28    Medical Consultants:     None.  Anti-Infectives:   Started empirically on vancomycin and Zosyn  Subjective:    Judith Stephens is more awake today able to carry on a conversation complaining of right upper quadrant pain. No burning when she urinates or dysuria, she denies any cough or shortness of breath.  Objective:    Vitals:   01/10/16 0424 01/10/16 0447 01/10/16 0546 01/10/16 0804  BP: (!) 108/53     Pulse: (!) 118     Resp:  (!) 24    Temp:   99.2 F (37.3 C) 97.5 F (36.4 C)  TempSrc:    Oral  SpO2:      Weight: 88.2 kg (194 lb 8 oz)     Height:        Intake/Output Summary (Last 24 hours) at 01/10/16 0818 Last data filed at 01/10/16 0700  Gross per 24 hour  Intake          3738.75 ml  Output              350 ml  Net          3388.75 ml   Filed Weights   01/09/16 1558 01/09/16 2224 01/10/16 0424  Weight: 85.3 kg (188 lb) 88.1 kg (194 lb 4.8 oz) 88.2 kg (194 lb 8 oz)    Exam: General exam: In no acute distress.She is soak in sweat  this morning Respiratory system: Good air movement and clear to auscultation. Cardiovascular system: S1 & S2 heard, RRR. No JVD. Gastrointestinal system: Abdomen is nondistended, soft with mild right upper quadrant tenderness. Central nervous system: Alert and oriented. No focal neurological deficits. Extremities: No pedal edema. Skin: No rashes, lesions or ulcers Psychiatry: Judgement and insight appear normal. Mood & affect appropriate.    Data Reviewed:    Labs: Basic Metabolic Panel:  Recent Labs Lab 01/09/16 1700 01/10/16 0440  NA 131* 132*  K 3.4* 3.5  CL 96* 102  CO2 23 21*  GLUCOSE 119* 90  BUN 14 12  CREATININE 0.99 0.64  CALCIUM 9.9 8.9   GFR Estimated Creatinine Clearance: 74.4 mL/min (by C-G formula based on SCr of 0.8 mg/dL). Liver Function Tests:  Recent Labs Lab 01/09/16 1700 01/10/16 0440  AST 60* 46*  ALT 17 14  ALKPHOS 105 88  BILITOT 1.3* 1.1  PROT 8.1 7.2  ALBUMIN 3.1* 2.6*    Recent Labs Lab  01/09/16 1700  LIPASE 26   No results for input(s): AMMONIA in the last 168 hours. Coagulation profile No results for input(s): INR, PROTIME in the last 168 hours.  CBC:  Recent Labs Lab 01/09/16 1700 01/10/16 0440  WBC 22.4* 18.7*  NEUTROABS  --  13.0*  HGB 10.3* 9.0*  HCT 31.5* 27.8*  MCV 80.8 81.5  PLT 453* 417*   Cardiac Enzymes:  Recent Labs Lab 01/09/16 2232  TROPONINI <0.03   BNP (last 3 results) No results for input(s): PROBNP in the last 8760 hours. CBG:  Recent Labs Lab 01/10/16 0008 01/10/16 0414 01/10/16 0738  GLUCAP 72 85 81   D-Dimer: No results for input(s): DDIMER in the last 72 hours. Hgb A1c: No results for input(s): HGBA1C in the last 72 hours. Lipid Profile: No results for input(s): CHOL, HDL, LDLCALC, TRIG, CHOLHDL, LDLDIRECT in the last 72 hours. Thyroid function studies:  Recent  Labs  01/09/16 2136  TSH 0.296*   Anemia work up: No results for input(s): VITAMINB12, FOLATE, FERRITIN, TIBC, IRON, RETICCTPCT in the last 72 hours. Sepsis Labs:  Recent Labs Lab 01/09/16 1700 01/09/16 1830 01/09/16 2154 01/10/16 0440  WBC 22.4*  --   --  18.7*  LATICACIDVEN  --  2.38* 0.96  --    Microbiology Recent Results (from the past 240 hour(s))  Blood culture (routine x 2)     Status: None (Preliminary result)   Collection Time: 01/09/16  6:19 PM  Result Value Ref Range Status   Specimen Description BLOOD RIGHT FOREARM  Final   Special Requests BOTTLES DRAWN AEROBIC ONLY 5CC  Final   Culture PENDING  Incomplete   Report Status PENDING  Incomplete  Blood culture (routine x 2)     Status: None (Preliminary result)   Collection Time: 01/09/16  6:44 PM  Result Value Ref Range Status   Specimen Description BLOOD RIGHT ANTECUBITAL  Final   Special Requests BOTTLES DRAWN AEROBIC AND ANAEROBIC 5CC  Final   Culture PENDING  Incomplete   Report Status PENDING  Incomplete     Medications:   . amLODipine  10 mg Oral Daily  . aspirin EC   81 mg Oral Daily  . enalapril  10 mg Oral Daily  . enoxaparin (LOVENOX) injection  40 mg Subcutaneous QHS  . fluticasone  2 spray Each Nare Daily  . glipiZIDE  10 mg Oral Q breakfast  . hydrochlorothiazide  25 mg Oral Daily  . insulin aspart  0-9 Units Subcutaneous TID WC  . levothyroxine  150 mcg Oral QAC breakfast  . piperacillin-tazobactam (ZOSYN)  IV  3.375 g Intravenous Q8H  . sodium chloride flush  3 mL Intravenous Q12H  . vancomycin  750 mg Intravenous Q12H   Continuous Infusions: . 0.9 % NaCl with KCl 20 mEq / L 75 mL/hr at 01/09/16 2349    Time spent: 35 min   LOS: 1 day   Charlynne Cousins  [facility] Pager [phone]  *Please refer to [email or web address], password [email or web address] to get updated schedule on who will round on this patient, as hospitalists switch teams weekly. If 7PM-7AM, please contact night-coverage at [email or web address], password [email or web address] for any overnight needs.  01/10/2016, 8:18 AM

## 2016-01-11 LAB — CBC
HCT: 27.2 % — ABNORMAL LOW (ref 36.0–46.0)
Hemoglobin: 8.7 g/dL — ABNORMAL LOW (ref 12.0–15.0)
MCH: 26.1 pg (ref 26.0–34.0)
MCHC: 32 g/dL (ref 30.0–36.0)
MCV: 81.7 fL (ref 78.0–100.0)
PLATELETS: 428 10*3/uL — AB (ref 150–400)
RBC: 3.33 MIL/uL — AB (ref 3.87–5.11)
RDW: 14.9 % (ref 11.5–15.5)
WBC: 13.6 10*3/uL — AB (ref 4.0–10.5)

## 2016-01-11 LAB — GLUCOSE, CAPILLARY
GLUCOSE-CAPILLARY: 59 mg/dL — AB (ref 65–99)
GLUCOSE-CAPILLARY: 126 mg/dL — AB (ref 65–99)
GLUCOSE-CAPILLARY: 151 mg/dL — AB (ref 65–99)
GLUCOSE-CAPILLARY: 111 mg/dL — AB (ref 65–99)
GLUCOSE-CAPILLARY: 139 mg/dL — AB (ref 65–99)
Glucose-Capillary: 38 mg/dL — CL (ref 65–99)
Glucose-Capillary: 127 mg/dL — ABNORMAL HIGH (ref 65–99)

## 2016-01-11 LAB — URINE CULTURE: CULTURE: NO GROWTH

## 2016-01-11 LAB — HEMOGLOBIN A1C
HEMOGLOBIN A1C: 6.4 % — AB (ref 4.8–5.6)
HEMOGLOBIN A1C: 6.5 % — AB (ref 4.8–5.6)
MEAN PLASMA GLUCOSE: 140 mg/dL
Mean Plasma Glucose: 137 mg/dL

## 2016-01-11 MED ORDER — DEXTROSE 50 % IV SOLN
INTRAVENOUS | Status: AC
  Administered 2016-01-11: 25 mL
  Filled 2016-01-11: qty 50

## 2016-01-11 MED ORDER — DEXTROSE 10 % IV SOLN
INTRAVENOUS | Status: DC
  Administered 2016-01-11 – 2016-01-12 (×2): via INTRAVENOUS
  Filled 2016-01-11 (×2): qty 1000

## 2016-01-11 NOTE — Progress Notes (Signed)
Hypoglycemic Event  CBG: 55   Treatment: 15 GM carbohydrate snack and D50 IV 25 mL  Symptoms: None  Follow-up CBG: Time:2214 CBG Result:63  Possible Reasons for Event: Unknown  Comments/MD notified:K. Schorr-NP    Sherril Shipman, Larence Penning

## 2016-01-11 NOTE — Progress Notes (Signed)
[facility] PROGRESS NOTE    Progress Note  Judith Stephens  [id number] DOB: 08-27-1940 DOA: 01/09/2016 PCP: Donnie Coffin, MD     Brief Narrative:   Judith Stephens is an 75 y.o. female past medical history of essential hypertension hypothyroidism the came into the hospital complaining of lightheadedness for the past 3 weeks, generalized weakness the day of admission with a temperature of 102. Patient noted some abdominal discomfort  Assessment/Plan:   Sepsis ([place]) Of unclear source: Cont IV vancomycin and Zosyn Has defervesced, but if her leukocytosis is slowly improving. Her night sweats can be explained by her new diagnosis of malignancy, but she has responded to empiric antibiotics as she has remained afebrile and his leukocytosis continues to improve. DC antihypertensive medications. CT scan of the abdomen and pelvis shows diffuse metastases, but no source of infection.Get a CT of the chest (CT metastatic disease at the bottom), she was significantly intravascularly depleted on admission initial chest x-ray was negative. Blood cultures and urine cultures remain negative to date.  Acute encephalopathy: Likely due to infectious etiology now resolved. I have personally reviewed the CT of the head shows no acute finding just mild atrophy.  Normocytic  Anemia: Her last hemoglobin in 2014 was 13 on admission it was 10, now today is 9. There is likely a degree of hemoconcentration, she doesn't remember when her last colonoscopy was, she denies any overt bleeding. We'll continue to monitor hemoglobin closely.  Controlled  Diabetes mellitus ([place]) type II: DC oral hypoglycemic agents, start sliding scale insulin. DC long-acting insulin and had an episode of hypo-glycemia on 7.24.2017.  Hyponatremia Urinary sodium is 13 likely hypovolemia continue aggressive IV fluid hydration.  Hypokalemia Result IV hydration and supplementation.  Moderate protein caloric malnutrition: Ensure 3  times a day.  Thrombocytosis: Likely acting as an acute phase reactant.  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 CT scan of the chest.  DVT prophylaxis: Lovenox Family Communication:none Disposition Plan/Barrier to D/C: Unable to determine Code Status:     Code Status Orders        Start     Ordered   01/09/16 2207  Full code  Continuous     01/09/16 2206    Code Status History    Date Active Date Inactive Code Status Order ID Comments User Context   03/06/2012  6:12 AM 03/07/2012 12:25 AM Full Code [id number]  Etta Quill, DO ED        IV Access:    Peripheral IV   Procedures and diagnostic studies:   Dg Chest 2 View  Result Date: 01/09/2016 CLINICAL DATA:  75 year old female with cough and right chest pain. EXAM: CHEST  2 VIEW COMPARISON:  03/05/2012 abdominal CT FINDINGS: Cardiomegaly noted. Elevation of the right hemidiaphragm again noted. Mild bibasilar atelectasis is present. There is no evidence of focal airspace disease, pulmonary edema, suspicious pulmonary nodule/mass, pleural effusion, or pneumothorax. No acute bony abnormalities are identified. IMPRESSION: Cardiomegaly with mild bibasilar atelectasis. Electronically Signed   By: Margarette Canada M.D.   On: 01/09/2016 19:26  Ct Head Wo Contrast  Result Date: 01/09/2016 CLINICAL DATA:  75 year old female with dizziness and headache for several days. EXAM: CT HEAD WITHOUT CONTRAST TECHNIQUE: Contiguous axial images were obtained from the base of the skull through the vertex without intravenous contrast. COMPARISON:  None. FINDINGS: Mild generalized cerebral volume loss noted. No  acute intracranial abnormalities are identified, including mass lesion or mass effect, hydrocephalus, extra-axial fluid collection, midline shift, hemorrhage, or acute infarction. The visualized bony calvarium is  unremarkable. IMPRESSION: No evidence of acute intracranial abnormality. Mild atrophy. Electronically Signed   By: Margarette Canada M.D.   On: 01/09/2016 19:28  Ct Abdomen Pelvis W Contrast  Result Date: 01/10/2016 CLINICAL DATA:  Generalized weakness. Abdominal discomfort. Remote history of colon cancer. EXAM: CT ABDOMEN AND PELVIS WITH CONTRAST TECHNIQUE: Multidetector CT imaging of the abdomen and pelvis was performed using the standard protocol following bolus administration of intravenous contrast. CONTRAST:  154mL ISOVUE-300 IOPAMIDOL (ISOVUE-300) INJECTION 61% COMPARISON:  CT 03/05/2012.  MRI 03/12/2012. FINDINGS: Lower chest: Lingular nodule measures 12 mm on the first image. Right base atelectasis. Heart is upper limits normal in size. Hepatobiliary: Extensive new low-density lesions throughout the liver concerning for metastatic disease. Conglomerate mass in the right hepatic lobe measures up to 8.6 cm. This large mass also demonstrates an area of a amorphous calcification centrally. Innumerable other smaller hepatic lesions. Gallstones noted within the gallbladder. Pancreas: No focal abnormality or ductal dilatation. Spleen: No focal abnormality.  Normal size. Adrenals/Urinary Tract: Cysts within the right kidney which appear benign. Nonobstructing stone in the mid to lower pole. No hydronephrosis. Adrenal glands and urinary bladder unremarkable. Stomach/Bowel: Prior right hemicolectomy. No evidence of bowel obstruction. Stomach and small bowel decompressed. Vascular/Lymphatic: Aorta is normal caliber. Scattered aortic calcifications. Small scattered retroperitoneal lymph nodes, none pathologically enlarged. Enlarged porta hepatis and peripancreatic lymph nodes in the right upper quadrant. Index node on image 37 has a short axis diameter of 14 mm in appears partially necrotic. Reproductive: Small low-density areas within the left ovary, likely small cysts, the largest 2.3 cm. Uterus and right ovary  unremarkable. Other: Trace interloop free fluid noted in the right abdomen. Trace free fluid in the pelvis. No free air. Musculoskeletal: No acute bony abnormality or focal bone lesion. IMPRESSION: Innumerable hepatic low-density lesions and masses compatible with metastatic disease. Cholelithiasis. Mildly enlarged right upper quadrant/porta hepatis lymph nodes, some which appear necrotic. Prior right hemicolectomy. 12 mm nodule in the lingula concerning for metastasis. Bibasilar atelectasis. Electronically Signed   By: Rolm Baptise M.D.   On: 01/10/2016 16:07    Medical Consultants:    None.  Anti-Infectives:   Started empirically on vancomycin and Zosyn  Subjective:    Judith Stephens is more awake today able to carry on a conversation complaining of right upper quadrant pain. No burning when she urinates or dysuria, she denies any cough or shortness of breath.  Objective:    Vitals:   01/10/16 1517 01/10/16 2143 01/11/16 0424 01/11/16 0624  BP: 131/69 (!) 141/75 125/67   Pulse: 95  65   Resp: 19 18 18    Temp: 98.7 F (37.1 C) 98.4 F (36.9 C) 100 F (37.8 C) 99 F (37.2 C)  TempSrc: Oral Oral Oral Oral  SpO2: 99% 97% 97%   Weight:      Height:        Intake/Output Summary (Last 24 hours) at 01/11/16 1242 Last data filed at 01/11/16 0925  Gross per 24 hour  Intake          1468.75 ml  Output                0 ml  Net          1468.75 ml   Filed Weights   01/09/16 1558 01/09/16 2224 01/10/16 0424  Weight: 85.3  kg (188 lb) 88.1 kg (194 lb 4.8 oz) 88.2 kg (194 lb 8 oz)    Exam: General exam: In no acute distress. Respiratory system: Good air movement and clear to auscultation. Cardiovascular system: S1 & S2 heard, RRR. No JVD. Gastrointestinal system: Abdomen is nondistended, soft with mild right upper quadrant tenderness. Central nervous system: Alert and oriented. No focal neurological deficits. Extremities: No pedal edema. Skin: No rashes, lesions or  ulcers Psychiatry: Judgement and insight appear normal. Mood & affect appropriate.    Data Reviewed:    Labs: Basic Metabolic Panel:  Recent Labs Lab 01/09/16 1700 01/10/16 0440  NA 131* 132*  K 3.4* 3.5  CL 96* 102  CO2 23 21*  GLUCOSE 119* 90  BUN 14 12  CREATININE 0.99 0.64  CALCIUM 9.9 8.9   GFR Estimated Creatinine Clearance: 74.4 mL/min (by C-G formula based on SCr of 0.8 mg/dL). Liver Function Tests:  Recent Labs Lab 01/09/16 1700 01/10/16 0440  AST 60* 46*  ALT 17 14  ALKPHOS 105 88  BILITOT 1.3* 1.1  PROT 8.1 7.2  ALBUMIN 3.1* 2.6*    Recent Labs Lab 01/09/16 1700  LIPASE 26   No results for input(s): AMMONIA in the last 168 hours. Coagulation profile No results for input(s): INR, PROTIME in the last 168 hours.  CBC:  Recent Labs Lab 01/09/16 1700 01/10/16 0440 01/11/16 1224  WBC 22.4* 18.7* 13.6*  NEUTROABS  --  13.0*  --   HGB 10.3* 9.0* 8.7*  HCT 31.5* 27.8* 27.2*  MCV 80.8 81.5 81.7  PLT 453* 417* 428*   Cardiac Enzymes:  Recent Labs Lab 01/09/16 2232  TROPONINI <0.03   BNP (last 3 results) No results for input(s): PROBNP in the last 8760 hours. CBG:  Recent Labs Lab 01/10/16 2319 01/11/16 0410 01/11/16 0514 01/11/16 0739 01/11/16 1150  GLUCAP 75 59* 126* 151* 111*   D-Dimer: No results for input(s): DDIMER in the last 72 hours. Hgb A1c:  Recent Labs  01/09/16 2232 01/10/16 0444  HGBA1C 6.4* 6.5*   Lipid Profile: No results for input(s): CHOL, HDL, LDLCALC, TRIG, CHOLHDL, LDLDIRECT in the last 72 hours. Thyroid function studies:  Recent Labs  01/09/16 2136  TSH 0.296*   Anemia work up: No results for input(s): VITAMINB12, FOLATE, FERRITIN, TIBC, IRON, RETICCTPCT in the last 72 hours. Sepsis Labs:  Recent Labs Lab 01/09/16 1700 01/09/16 1830 01/09/16 2154 01/10/16 0440 01/11/16 1224  WBC 22.4*  --   --  18.7* 13.6*  LATICACIDVEN  --  2.38* 0.96  --   --    Microbiology Recent Results (from  the past 240 hour(s))  Blood culture (routine x 2)     Status: None (Preliminary result)   Collection Time: 01/09/16  6:19 PM  Result Value Ref Range Status   Specimen Description BLOOD RIGHT FOREARM  Final   Special Requests BOTTLES DRAWN AEROBIC ONLY 5CC  Final   Culture PENDING  Incomplete   Report Status PENDING  Incomplete  Blood culture (routine x 2)     Status: None (Preliminary result)   Collection Time: 01/09/16  6:44 PM  Result Value Ref Range Status   Specimen Description BLOOD RIGHT ANTECUBITAL  Final   Special Requests BOTTLES DRAWN AEROBIC AND ANAEROBIC 5CC  Final   Culture   Final    NO GROWTH < 24 HOURS Performed at [facility]    Report Status PENDING  Incomplete  Urine culture     Status: None  Collection Time: 01/09/16  7:54 PM  Result Value Ref Range Status   Specimen Description URINE, RANDOM  Final   Special Requests NONE  Final   Culture NO GROWTH Performed at [facility]   Final   Report Status 01/11/2016 FINAL  Final     Medications:   . aspirin EC  81 mg Oral Daily  . enoxaparin (LOVENOX) injection  40 mg Subcutaneous QHS  . fluticasone  2 spray Each Nare Daily  . insulin aspart  0-5 Units Subcutaneous QHS  . insulin aspart  0-9 Units Subcutaneous TID WC  . insulin aspart  4 Units Subcutaneous TID WC  . levothyroxine  150 mcg Oral QAC breakfast  . piperacillin-tazobactam (ZOSYN)  IV  3.375 g Intravenous Q8H  . sodium chloride flush  3 mL Intravenous Q12H  . vancomycin  750 mg Intravenous Q12H   Continuous Infusions: . dextrose 25 mL/hr at 01/11/16 0503    Time spent: 35 min   LOS: 2 days   Charlynne Cousins  [facility] Pager [phone]  *Please refer to [email or web address], password [email or web address] to get updated schedule on who will round on this patient, as hospitalists switch teams weekly. If 7PM-7AM, please contact night-coverage at [email or web address], password [email or web address] for any overnight needs.  01/11/2016, 12:42 PM

## 2016-01-11 NOTE — Progress Notes (Signed)
Hypoglycemic Event  CBG: 59   Treatment: 15 GM carbohydrate snack and D50 IV 25 mL  Symptoms: None  Follow-up CBG: Time:0500 CBG Result:126  Possible Reasons for Event: Unknown  Comments/MD notified: Schorr-NP     Leanora Ivanoff

## 2016-01-12 ENCOUNTER — Encounter (HOSPITAL_COMMUNITY): Payer: Self-pay | Admitting: Radiology

## 2016-01-12 ENCOUNTER — Inpatient Hospital Stay (HOSPITAL_COMMUNITY): Payer: Medicare Other

## 2016-01-12 DIAGNOSIS — E876 Hypokalemia: Secondary | ICD-10-CM

## 2016-01-12 DIAGNOSIS — A419 Sepsis, unspecified organism: Principal | ICD-10-CM

## 2016-01-12 DIAGNOSIS — E871 Hypo-osmolality and hyponatremia: Secondary | ICD-10-CM

## 2016-01-12 DIAGNOSIS — R509 Fever, unspecified: Secondary | ICD-10-CM

## 2016-01-12 LAB — GLUCOSE, CAPILLARY
GLUCOSE-CAPILLARY: 113 mg/dL — AB (ref 65–99)
GLUCOSE-CAPILLARY: 124 mg/dL — AB (ref 65–99)
Glucose-Capillary: 118 mg/dL — ABNORMAL HIGH (ref 65–99)
Glucose-Capillary: 126 mg/dL — ABNORMAL HIGH (ref 65–99)
Glucose-Capillary: 209 mg/dL — ABNORMAL HIGH (ref 65–99)

## 2016-01-12 LAB — COMPREHENSIVE METABOLIC PANEL
ALK PHOS: 89 U/L (ref 38–126)
ALT: 15 U/L (ref 14–54)
ANION GAP: 6 (ref 5–15)
AST: 37 U/L (ref 15–41)
Albumin: 2.3 g/dL — ABNORMAL LOW (ref 3.5–5.0)
BILIRUBIN TOTAL: 0.8 mg/dL (ref 0.3–1.2)
BUN: 7 mg/dL (ref 6–20)
CALCIUM: 8.9 mg/dL (ref 8.9–10.3)
CO2: 26 mmol/L (ref 22–32)
CREATININE: 0.72 mg/dL (ref 0.44–1.00)
Chloride: 101 mmol/L (ref 101–111)
GFR calc non Af Amer: >60 mL/min (ref >60)
GLUCOSE: 125 mg/dL — AB (ref 65–99)
Potassium: 3.5 mmol/L (ref 3.5–5.1)
Sodium: 133 mmol/L — ABNORMAL LOW (ref 135–145)
TOTAL PROTEIN: 7 g/dL (ref 6.5–8.1)

## 2016-01-12 LAB — VANCOMYCIN, TROUGH: Vancomycin Tr: 7 ug/mL — ABNORMAL LOW (ref 15–20)

## 2016-01-12 MED ORDER — VANCOMYCIN HCL 10 G IV SOLR
1250.0000 mg | Freq: Once | INTRAVENOUS | Status: AC
  Administered 2016-01-12: 1250 mg via INTRAVENOUS
  Filled 2016-01-12: qty 1250

## 2016-01-12 MED ORDER — IOPAMIDOL (ISOVUE-300) INJECTION 61%
75.0000 mL | Freq: Once | INTRAVENOUS | Status: AC | PRN
  Administered 2016-01-12: 75 mL via INTRAVENOUS

## 2016-01-12 MED ORDER — VANCOMYCIN HCL IN DEXTROSE 1-5 GM/200ML-% IV SOLN: 1000.0000 mg | Freq: Two times a day (BID) | INTRAVENOUS | Status: DC

## 2016-01-12 MED ORDER — AMOXICILLIN-POT CLAVULANATE 875-125 MG PO TABS
1.0000 | ORAL_TABLET | Freq: Two times a day (BID) | ORAL | Status: DC
  Administered 2016-01-12 – 2016-01-13 (×2): 1 via ORAL
  Filled 2016-01-12 (×2): qty 1

## 2016-01-12 NOTE — Evaluation (Signed)
Physical Therapy Evaluation Patient Details Name: Judith Stephens MRN: [id number] DOB: [birth year] Today's Date: 01/12/2016   History of Present Illness  75 yo female admitted syncope, sepsis, new met liver disease. Hx of HTN, DM  Clinical Impression  On eval, pt required Min guard assist for mobility. She walked ~100 feet. Pt c/o 8/10 pain L knee with activity (pt states she thinks it's due to her arthritis). Applied ice pack to L knee once back in bed. Instructed pt to keep ice pack on NLT 20 minutes. Will follow and progress activity as tolerated.     Follow Up Recommendations Supervision for mobility/OOB    Equipment Recommendations  None recommended by PT    Recommendations for Other Services       Precautions / Restrictions Precautions Precautions: Fall Restrictions Weight Bearing Restrictions: No      Mobility  Bed Mobility Overal bed mobility: Independent                Transfers Overall transfer level: Needs assistance   Transfers: Sit to/from Stand Sit to Stand: Min guard         General transfer comment: close guard for safety. Some difficulty due to L knee pain  Ambulation/Gait Ambulation/Gait assistance: Min guard Ambulation Distance (Feet): 100 Feet Assistive device:  (hallway handrail intermittently) Gait Pattern/deviations: Step-to pattern;Step-through pattern;Antalgic;Decreased step length - left     General Gait Details: Painful L knee (pt states likely due to arthrtitis). Pain rated 8/10. Gait is antalgic.   Stairs            Wheelchair Mobility    Modified Rankin (Stroke Patients Only)       Balance                                             Pertinent Vitals/Pain Pain Assessment: 0-10 Pain Score: 8  Pain Location: L knee Pain Descriptors / Indicators: Sore;Aching Pain Intervention(s): Limited activity within patient's tolerance;Monitored during session;Ice applied    Home Living Family/patient expects  to be discharged to:: Private residence Living Arrangements: Spouse/significant other   Type of Home: House       Home Layout: Two level;Bed/bath upstairs Home Equipment: None      Prior Function Level of Independence: Independent               Hand Dominance        Extremity/Trunk Assessment   Upper Extremity Assessment: Overall WFL for tasks assessed           Lower Extremity Assessment: Overall WFL for tasks assessed      Cervical / Trunk Assessment: Normal  Communication   Communication: No difficulties  Cognition Arousal/Alertness: Awake/alert Behavior During Therapy: WFL for tasks assessed/performed Overall Cognitive Status: Within Functional Limits for tasks assessed                      General Comments      Exercises        Assessment/Plan    PT Assessment Patient needs continued PT services  PT Diagnosis Difficulty walking;Acute pain   PT Problem List Pain;Decreased activity tolerance;Decreased mobility  PT Treatment Interventions Gait training;Functional mobility training;Therapeutic exercise;Therapeutic activities;Patient/family education   PT Goals (Current goals can be found in the Care Plan section) Acute Rehab PT Goals Patient Stated Goal: less pain PT Goal Formulation: With patient Time  For Goal Achievement: 01/26/16 Potential to Achieve Goals: Good    Frequency Min 3X/week   Barriers to discharge        Co-evaluation               End of Session   Activity Tolerance: Patient limited by pain Patient left: in bed;with call Osika/phone within reach;with bed alarm set           Time: 2060-1561 PT Time Calculation (min) (ACUTE ONLY): 24 min   Charges:   PT Evaluation $PT Eval Low Complexity: 1 Procedure PT Treatments $Gait Training: 8-22 mins   PT G Codes:        Weston Anna, MPT Pager: [phone]

## 2016-01-12 NOTE — Progress Notes (Signed)
PROGRESS NOTE    Judith Stephens  [id number] DOB: 07-23-1940 DOA: 01/09/2016  PCP: Donnie Coffin, MD   Brief Narrative:  75 y/o with HTN, hypothyroidism, adenocarcinoma of colon s/p right hemicolectomy and Xeloda presented to the hospital for feeling weak, light headed for 3 wks. Found to have a fever of 102 in ER. No complaints of fevers at home. No source for fever found but found to have metastasis in the liver and nodule in lungs.   Subjective: No complaints.   Assessment & Plan:   Principal Problem:   Sepsis ([place]) - fever and WBC count of 22 - source still undetermined- mildly positive UA but asymptomatic- U culture negative - blood cx x 2 negative - will stop IV Zosyn and Vanc and transition to oral Augmentin  Active Problems: Liver metastasis - h/o colon cancer - will notify her oncologist- findings discussed with patient today - obtain CT chest as lung nodule also noted on CT abd    Anemia - Iron deficiency in the past due to GI bleed from cancer- Iron stopped in 2014 - anemia panel in AM    Diabetes mellitus  - A1c 6.5 - hypoglycemic on admission- now hyperglycemic - cont Insulin with meals  HTN - norvasc - hold enalapril and HCTZ    Hyponatremia - mild- possibly due to HCTZ    Hypokalemia - resolved   Hypothyroid - low TSH- recheck and check Fr T4     DVT prophylaxis: lovenox Code Status: full code Family Communication:  Disposition Plan: discharge in 1-2 days Consultants:   none Procedures:   none Antimicrobials:  Anti-infectives    Start     Dose/Rate Route Frequency Ordered Stop   01/12/16 1800  vancomycin (VANCOCIN) IVPB 1000 mg/200 mL premix     1,000 mg 200 mL/hr over 60 Minutes Intravenous Every 12 hours 01/12/16 0603     01/12/16 0615  vancomycin (VANCOCIN) 1,250 mg in sodium chloride 0.9 % 250 mL IVPB     1,250 mg 166.7 mL/hr over 90 Minutes Intravenous  Once 01/12/16 0603 01/12/16 0807   01/10/16 0600  vancomycin (VANCOCIN) IVPB  750 mg/150 ml premix  Status:  Discontinued     750 mg 150 mL/hr over 60 Minutes Intravenous Every 12 hours 01/09/16 1913 01/12/16 0601   01/09/16 2359  piperacillin-tazobactam (ZOSYN) IVPB 3.375 g     3.375 g 12.5 mL/hr over 240 Minutes Intravenous Every 8 hours 01/09/16 1913     01/09/16 1815  piperacillin-tazobactam (ZOSYN) IVPB 3.375 g     3.375 g 100 mL/hr over 30 Minutes Intravenous  Once 01/09/16 1807 01/09/16 1957   01/09/16 1815  vancomycin (VANCOCIN) IVPB 1000 mg/200 mL premix     1,000 mg 200 mL/hr over 60 Minutes Intravenous  Once 01/09/16 1807 01/09/16 1957       Objective: Vitals:   01/11/16 2026 01/12/16 0420 01/12/16 0500 01/12/16 1315  BP: 140/72 130/88  125/72  Pulse: 87 100  96  Resp: 18 18  (!) 24  Temp: 98.4 F (36.9 C) 99.1 F (37.3 C)  98.8 F (37.1 C)  TempSrc: Oral Oral  Oral  SpO2: 98% 96%  99%  Weight:   87.1 kg (192 lb 1.6 oz)   Height:        Intake/Output Summary (Last 24 hours) at 01/12/16 1546 Last data filed at 01/12/16 1045  Gross per 24 hour  Intake             1820 ml  Output              300 ml  Net             1520 ml   Filed Weights   01/09/16 2224 01/10/16 0424 01/12/16 0500  Weight: 88.1 kg (194 lb 4.8 oz) 88.2 kg (194 lb 8 oz) 87.1 kg (192 lb 1.6 oz)    Examination: General exam: Appears comfortable  HEENT: PERRLA, oral mucosa moist, no sclera icterus or thrush Respiratory system: Clear to auscultation. Respiratory effort normal. Cardiovascular system: S1 & S2 heard, RRR.  No murmurs  Gastrointestinal system: Abdomen soft, non-tender, nondistended. Normal bowel sound. No organomegaly Central nervous system: Alert and oriented. No focal neurological deficits. Extremities: No cyanosis, clubbing or edema Skin: No rashes or ulcers Psychiatry:  Mood & affect appropriate.     Data Reviewed: I have personally reviewed following labs and imaging studies  CBC:  Recent Labs Lab 01/09/16 1700 01/10/16 0440 01/11/16 1224    WBC 22.4* 18.7* 13.6*  NEUTROABS  --  13.0*  --   HGB 10.3* 9.0* 8.7*  HCT 31.5* 27.8* 27.2*  MCV 80.8 81.5 81.7  PLT 453* 417* 123456*   Basic Metabolic Panel:  Recent Labs Lab 01/09/16 1700 01/10/16 0440 01/12/16 0449  NA 131* 132* 133*  K 3.4* 3.5 3.5  CL 96* 102 101  CO2 23 21* 26  GLUCOSE 119* 90 125*  BUN 14 12 7   CREATININE 0.99 0.64 0.72  CALCIUM 9.9 8.9 8.9   GFR: Estimated Creatinine Clearance: 73.9 mL/min (by C-G formula based on SCr of 0.8 mg/dL). Liver Function Tests:  Recent Labs Lab 01/09/16 1700 01/10/16 0440 01/12/16 0449  AST 60* 46* 37  ALT 17 14 15   ALKPHOS 105 88 89  BILITOT 1.3* 1.1 0.8  PROT 8.1 7.2 7.0  ALBUMIN 3.1* 2.6* 2.3*    Recent Labs Lab 01/09/16 1700  LIPASE 26   No results for input(s): AMMONIA in the last 168 hours. Coagulation Profile: No results for input(s): INR, PROTIME in the last 168 hours. Cardiac Enzymes:  Recent Labs Lab 01/09/16 2232  TROPONINI <0.03   BNP (last 3 results) No results for input(s): PROBNP in the last 8760 hours. HbA1C:  Recent Labs  01/09/16 2232 01/10/16 0444  HGBA1C 6.4* 6.5*   CBG:  Recent Labs Lab 01/11/16 1635 01/11/16 2022 01/12/16 0053 01/12/16 0416 01/12/16 0729  GLUCAP 127* 139* 124* 118* 126*   Lipid Profile: No results for input(s): CHOL, HDL, LDLCALC, TRIG, CHOLHDL, LDLDIRECT in the last 72 hours. Thyroid Function Tests:  Recent Labs  01/09/16 2136  TSH 0.296*   Anemia Panel: No results for input(s): VITAMINB12, FOLATE, FERRITIN, TIBC, IRON, RETICCTPCT in the last 72 hours. Urine analysis:    Component Value Date/Time   COLORURINE ORANGE (A) 01/09/2016 1954   APPEARANCEUR HAZY (A) 01/09/2016 1954   LABSPEC 1.021 01/09/2016 1954   PHURINE 5.5 01/09/2016 1954   GLUCOSEU NEGATIVE 01/09/2016 1954   HGBUR NEGATIVE 01/09/2016 1954   BILIRUBINUR SMALL (A) 01/09/2016 1954   KETONESUR NEGATIVE 01/09/2016 1954   PROTEINUR NEGATIVE 01/09/2016 1954   NITRITE  POSITIVE (A) 01/09/2016 1954   LEUKOCYTESUR SMALL (A) 01/09/2016 1954   Sepsis Labs: @LABRCNTIP (procalcitonin:4,lacticidven:4) ) Recent Results (from the past 240 hour(s))  Blood culture (routine x 2)     Status: None (Preliminary result)   Collection Time: 01/09/16  6:19 PM  Result Value Ref Range Status   Specimen Description BLOOD RIGHT FOREARM  Final  Special Requests BOTTLES DRAWN AEROBIC ONLY 5CC  Final   Culture PENDING  Incomplete   Report Status PENDING  Incomplete  Blood culture (routine x 2)     Status: None (Preliminary result)   Collection Time: 01/09/16  6:44 PM  Result Value Ref Range Status   Specimen Description BLOOD RIGHT ANTECUBITAL  Final   Special Requests BOTTLES DRAWN AEROBIC AND ANAEROBIC 5CC  Final   Culture   Final    NO GROWTH 2 DAYS Performed at [facility]    Report Status PENDING  Incomplete  Urine culture     Status: None   Collection Time: 01/09/16  7:54 PM  Result Value Ref Range Status   Specimen Description URINE, RANDOM  Final   Special Requests NONE  Final   Culture NO GROWTH Performed at [facility]   Final   Report Status 01/11/2016 FINAL  Final         Radiology Studies: No results found.    Scheduled Meds: . aspirin EC  81 mg Oral Daily  . enoxaparin (LOVENOX) injection  40 mg Subcutaneous QHS  . fluticasone  2 spray Each Nare Daily  . insulin aspart  0-5 Units Subcutaneous QHS  . insulin aspart  0-9 Units Subcutaneous TID WC  . insulin aspart  4 Units Subcutaneous TID WC  . levothyroxine  150 mcg Oral QAC breakfast  . piperacillin-tazobactam (ZOSYN)  IV  3.375 g Intravenous Q8H  . sodium chloride flush  3 mL Intravenous Q12H  . vancomycin  1,000 mg Intravenous Q12H   Continuous Infusions: . dextrose 25 mL/hr at 01/11/16 0503     LOS: 3 days    Time spent in minutes: 83    [place], MD [facility] Pager: [email or web address] Password [facility] 01/12/2016, 3:46 PM

## 2016-01-12 NOTE — Progress Notes (Signed)
Pharmacy Antibiotic Note  Judith Stephens is a 75 y.o. female admitted on 01/09/2016 with sepsis.  Pharmacy has been consulted for Vancomycin dosing.  Plan: Vancomycin 1gm IV every 12 hours.  Goal trough 15-20 mcg/mL. after giving 1250mg  iv vancomycin x1 7/26 AM  Height: 5\' 10"  (177.8 cm) Weight: 192 lb 1.6 oz (87.1 kg) IBW/kg (Calculated) : 68.5  Temp (24hrs), Avg:98.8 F (37.1 C), Min:98.4 F (36.9 C), Max:99.1 F (37.3 C)   Recent Labs Lab 01/09/16 1700 01/09/16 1830 01/09/16 2154 01/10/16 0440 01/11/16 1224 01/12/16 0449  WBC 22.4*  --   --  18.7* 13.6*  --   CREATININE 0.99  --   --  0.64  --  0.72  LATICACIDVEN  --  2.38* 0.96  --   --   --   VANCOTROUGH  --   --   --   --   --  7*    Estimated Creatinine Clearance: 73.9 mL/min (by C-G formula based on SCr of 0.8 mg/dL).    No Known Allergies  Antimicrobials this admission: Vanc 7/23 >> Zosyn 7/23 >>  Dose adjustments this admission: VT 7/26 0500___7___  prior doses charted correctly  Microbiology results: 7/23 BCx: NGTD 7/23 UCx: NGTD  Thank you for allowing pharmacy to be a part of this patient's care.  [name] 01/12/2016 6:04 AM

## 2016-01-13 ENCOUNTER — Other Ambulatory Visit: Payer: Self-pay | Admitting: General Surgery

## 2016-01-13 DIAGNOSIS — R160 Hepatomegaly, not elsewhere classified: Secondary | ICD-10-CM

## 2016-01-13 DIAGNOSIS — R634 Abnormal weight loss: Secondary | ICD-10-CM

## 2016-01-13 DIAGNOSIS — C787 Secondary malignant neoplasm of liver and intrahepatic bile duct: Secondary | ICD-10-CM

## 2016-01-13 DIAGNOSIS — E119 Type 2 diabetes mellitus without complications: Secondary | ICD-10-CM

## 2016-01-13 DIAGNOSIS — C182 Malignant neoplasm of ascending colon: Secondary | ICD-10-CM

## 2016-01-13 DIAGNOSIS — I10 Essential (primary) hypertension: Secondary | ICD-10-CM

## 2016-01-13 DIAGNOSIS — C7800 Secondary malignant neoplasm of unspecified lung: Secondary | ICD-10-CM

## 2016-01-13 DIAGNOSIS — D509 Iron deficiency anemia, unspecified: Secondary | ICD-10-CM

## 2016-01-13 DIAGNOSIS — R630 Anorexia: Secondary | ICD-10-CM

## 2016-01-13 LAB — GLUCOSE, CAPILLARY
GLUCOSE-CAPILLARY: 163 mg/dL — AB (ref 65–99)
GLUCOSE-CAPILLARY: 159 mg/dL — AB (ref 65–99)
GLUCOSE-CAPILLARY: 221 mg/dL — AB (ref 65–99)

## 2016-01-13 LAB — COMPREHENSIVE METABOLIC PANEL
ALK PHOS: 101 U/L (ref 38–126)
ALT: 15 U/L (ref 14–54)
ANION GAP: 6 (ref 5–15)
AST: 32 U/L (ref 15–41)
Albumin: 2.4 g/dL — ABNORMAL LOW (ref 3.5–5.0)
BILIRUBIN TOTAL: 0.8 mg/dL (ref 0.3–1.2)
BUN: 8 mg/dL (ref 6–20)
CALCIUM: 9.5 mg/dL (ref 8.9–10.3)
CO2: 27 mmol/L (ref 22–32)
Chloride: 102 mmol/L (ref 101–111)
Creatinine, Ser: 0.66 mg/dL (ref 0.44–1.00)
GFR calc Af Amer: >60 mL/min (ref >60)
Glucose, Bld: 150 mg/dL — ABNORMAL HIGH (ref 65–99)
POTASSIUM: 3.5 mmol/L (ref 3.5–5.1)
Sodium: 135 mmol/L (ref 135–145)
TOTAL PROTEIN: 7.2 g/dL (ref 6.5–8.1)

## 2016-01-13 LAB — CBC
HEMATOCRIT: 27.5 % — AB (ref 36.0–46.0)
HEMOGLOBIN: 8.8 g/dL — AB (ref 12.0–15.0)
MCH: 26.2 pg (ref 26.0–34.0)
MCHC: 32 g/dL (ref 30.0–36.0)
MCV: 81.8 fL (ref 78.0–100.0)
Platelets: 581 10*3/uL — ABNORMAL HIGH (ref 150–400)
RBC: 3.36 MIL/uL — ABNORMAL LOW (ref 3.87–5.11)
RDW: 15.2 % (ref 11.5–15.5)
WBC: 11.3 10*3/uL — AB (ref 4.0–10.5)

## 2016-01-13 LAB — T4, FREE: FREE T4: 1.12 ng/dL (ref 0.61–1.12)

## 2016-01-13 LAB — TSH: TSH: 1.217 u[IU]/mL (ref 0.350–4.500)

## 2016-01-13 MED ORDER — AMOXICILLIN-POT CLAVULANATE 875-125 MG PO TABS: 1.0000 | ORAL_TABLET | Freq: Two times a day (BID) | ORAL | 0 refills | Status: DC

## 2016-01-13 NOTE — Progress Notes (Signed)
IP PROGRESS NOTE  Subjective:   Judith Stephens is known to me with a history of colon cancer. She reports not feeling well for the past month. She has anorexia and sinus drainage. She presents emergency room on 01/09/2016 with generalized weakness and a fever. She was admitted for further evaluation.  No source for infection has been identified. The liver enzymes were mildly elevated. A CT of the abdomen and pelvis and 01/10/2016 revealed innumerable hepatic metastases, mildly enlarged porta hepatis nodes, and a 12 mm nodule in the lingula. A CT of the chest on 01/12/2016 confirmed multiple pulmonary nodules bilaterally.  She would like to go home. She denies bleeding. Objective: Vital signs in last 24 hours: Blood pressure 132/87, pulse 93, temperature 98.6 F (37 C), temperature source Oral, resp. rate 20, height 5\' 10"  (1.778 m), weight 200 lb 3.2 oz (90.8 kg), SpO2 100 %.  Intake/Output from previous day: 07/26 0701 - 07/27 0700 In: 860 [P.O.:460; I.V.:400] Out: 800 [Urine:800]  Physical Exam:  HEENT: Neck without mass Lungs: Clear bilaterally Cardiac: Regular rate and rhythm Abdomen: Nontender, no hepatomegaly Extremities: No leg edema Lymph nodes: No cervical, supraclavicular, axillary, or inguinal nodes    Lab Results:  Recent Labs  01/11/16 1224 01/13/16 0506  WBC 13.6* 11.3*  HGB 8.7* 8.8*  HCT 27.2* 27.5*  PLT 428* 581*    BMET  Recent Labs  01/12/16 0449 01/13/16 0506  NA 133* 135  K 3.5 3.5  CL 101 102  CO2 26 27  GLUCOSE 125* 150*  BUN 7 8  CREATININE 0.72 0.66  CALCIUM 8.9 9.5    Studies/Results: Ct Chest W Contrast  Result Date: 01/12/2016 CLINICAL DATA:  Weakness and lightheadedness. Fever. History of colon cancer. EXAM: CT CHEST WITH CONTRAST TECHNIQUE: Multidetector CT imaging of the chest was performed during intravenous contrast administration. CONTRAST:  75 ISOVUE-300 IOPAMIDOL (ISOVUE-300) INJECTION 61% COMPARISON:  CT abdomen pelvis -  01/10/2016 FINDINGS: Cardiovascular: Borderline cardiomegaly. No pericardial effusion. Enlarged caliber the main pulmonary artery measuring 3.7 cm in diameter. Although this examination was not tailored for the evaluation the pulmonary arteries, there are no discrete filling defects within the central pulmonary arterial tree to suggest central pulmonary embolism. Fusiform ectasia of the ascending thoracic aorta measuring 3.8 cm in diameter. The thoracic aorta tapers to a normal caliber at the level of the aortic arch. The left vertebral artery is noted to arise directly from the aortic arch. Otherwise, conventional configuration of the aortic arch. No definite thoracic aortic dissection or periaortic stranding. Mediastinum/Nodes: Minimal mediastinal adenopathy with index precarinal lymph node measuring approximately 1 cm in greatest short axis diameter (image 47, series 2) and index sub carinal lymph node measuring approximately 1.1 cm (image 52, series 2). No definitive bulky hilar adenopathy. No axillary adenopathy. Lungs/Pleura: Evaluation of the pulmonary parenchyma is degraded secondary to patient respiratory artifact. Multiple pulmonary nodules are seen bilaterally with dominant nodule within the right upper lobe measuring approximately 1.3 x 1.0 cm (image 36, series 4) and dominant nodule within the left upper lobe measuring approximately 1.0 x 1.2 cm (image 65, series 4), findings compatible with metastatic disease. A total of 5 pulmonary nodules / metastases are seen within the left upper lobe while 6 metastases are seen within the right upper lobe. Trace right-sided pleural effusion. There is minimal subsegmental atelectasis within the right lower lobe and about the right costophrenic angle. The central airways appear patent given patient respiratory artifact. No pneumothorax. Upper Abdomen: Limited evaluation of the  upper abdomen re- demonstrates innumerable hepatic metastasis with confluent mass nearly  replacing the anterior segment of the right lobe of liver measuring approximately 10.7 x 6.9 cm. Musculoskeletal: Regional soft tissues appear normal. Normal appearance of the thyroid gland. No definite acute or aggressive osseous abnormalities. Stigmata of DISH throughout the thoracic spine. IMPRESSION: 1. Trace right-sided pleural effusion and right lower lobe atelectasis. Otherwise, no definite acute cardiopulmonary disease on this respiratory degraded examination. 2. Findings compatible with metastatic disease to the chest with multiple bilateral pulmonary nodules and associated suspected mediastinal adenopathy. 3. Re- demonstrated known hepatic metastases within the imaged upper abdomen. 4. Borderline cardiomegaly and enlarged caliber of the main pulmonary artery, nonspecific though could be seen in the setting of pulmonary arterial hypertension. Electronically Signed   By: Sandi Mariscal M.D.   On: 01/12/2016 21:00   Medications: I have reviewed the patient's current medications.  Assessment/Plan: 1. 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 She began adjuvant Xeloda on 05/14/2012. Final cycle of Xeloda started on 10/22/2012. 2. Severe microcytic anemia likely secondary to iron deficiency related to bleeding from the colon tumor. The hemoglobin and MCV improved on oral iron. Oral iron was discontinued following office visit 12/16/2012.  Recurrent microcytic anemia July 2017 3. Diabetes. 4. Hypertension. 5. Hyperlipidemia. 6. History of hand/foot syndrome secondary to Xeloda prompting the Xeloda to be discontinued after day 12, cycle 2. Improved. Xeloda was dose reduced beginning with cycle 3. 7. History of mild diarrhea secondary to Xeloda.  8. History of eye tearing-most likely related to seasonal allergies though this could be a manifestation of Xeloda toxicity.  9. Status post root canal  10/02/2012. 10. Colonoscopy 03/13/2013 by Dr. Amedeo Plenty. Pathology on transverse colon polyp showed a tubular adenoma. Next colonoscopy recommended that a three-year interval. 11. Anorexia/weight loss 12. CT abdomen/pelvis 01/10/2016 and CT of the chest 01/12/2016 confirmed multiple bone and liver metastases with necrotic porta hepatis nodes  Judith Stephens has a remote history of colon cancer. She presents with anorexia/weight loss and a fever. No source for infection has been identified. She has multiple lung and liver metastases. I discussed the differential diagnosis with Judith Stephens. She most likely has recurrent colon cancer. The fever may be related to "tumor fever ". She has microcytic anemia, potentially related to GI blood loss.  Recommendations: 1. Ultrasound-guided biopsy of a liver lesion in interventional radiology 2. Check CEA 3. Ferritin level 4. Check stool Hemoccult 5. Outpatient follow-up will be scheduled at the [facility] for next week.     LOS: 4 days   Betsy Coder, MD   01/13/2016, 7:55 AM

## 2016-01-13 NOTE — Progress Notes (Addendum)
PROGRESS NOTE    Judith Stephens  [id number] DOB: [birth year] DOA: 01/09/2016  PCP: Donnie Coffin, MD   Brief Narrative:  75 y/o with HTN, hypothyroidism, adenocarcinoma of colon s/p right hemicolectomy and Xeloda presented to the hospital for feeling weak, light headed for 3 wks. Found to have a fever of 102 in ER. No complaints of fevers at home. No source for fever found but found to have metastasis in the liver and nodule in lungs.   Subjective: No complaints.   Assessment & Plan:   Principal Problem:   Sepsis ([place]) - fever and WBC count of 22 - source still undetermined- mildly positive UA but asymptomatic- U culture negative - blood cx x 2 negative - stopped IV Zosyn and Vanc and transitioned to oral Augmentin on 7/26  Active Problems: Liver metastasis - h/o colon cancer - I have notified her oncologist - obtained CT chest as lung nodule also noted on CT abd- has multiple lung nodules -- findings discussed with patient - Dr Ammie Dalton has ordered and ultrasound guided liver biopsy for tomorrow    Anemia - Iron deficiency in the past due to GI bleed from cancer- Iron stopped in 2014 - anemia panel in AM    Diabetes mellitus  - A1c 6.5 - hypoglycemic on admission- now hyperglycemic - cont Insulin with meals  HTN - norvasc - hold enalapril and HCTZ    Hyponatremia - mild- possibly due to HCTZ    Hypokalemia - resolved   Hypothyroid - low TSH- rechecked and checked Fr T4- both normal     DVT prophylaxis: lovenox Code Status: full code Family Communication:  Disposition Plan: discharge after liver biopsy Consultants:   none Procedures:   none Antimicrobials:  Anti-infectives    Start     Dose/Rate Route Frequency Ordered Stop   01/12/16 2200  amoxicillin-clavulanate (AUGMENTIN) 875-125 MG per tablet 1 tablet     1 tablet Oral Every 12 hours 01/12/16 1634     01/12/16 1800  vancomycin (VANCOCIN) IVPB 1000 mg/200 mL premix  Status:  Discontinued     1,000  mg 200 mL/hr over 60 Minutes Intravenous Every 12 hours 01/12/16 0603 01/12/16 1628   01/12/16 0615  vancomycin (VANCOCIN) 1,250 mg in sodium chloride 0.9 % 250 mL IVPB     1,250 mg 166.7 mL/hr over 90 Minutes Intravenous  Once 01/12/16 0603 01/12/16 0807   01/10/16 0600  vancomycin (VANCOCIN) IVPB 750 mg/150 ml premix  Status:  Discontinued     750 mg 150 mL/hr over 60 Minutes Intravenous Every 12 hours 01/09/16 1913 01/12/16 0601   01/09/16 2359  piperacillin-tazobactam (ZOSYN) IVPB 3.375 g  Status:  Discontinued     3.375 g 12.5 mL/hr over 240 Minutes Intravenous Every 8 hours 01/09/16 1913 01/12/16 1628   01/09/16 1815  piperacillin-tazobactam (ZOSYN) IVPB 3.375 g     3.375 g 100 mL/hr over 30 Minutes Intravenous  Once 01/09/16 1807 01/09/16 1957   01/09/16 1815  vancomycin (VANCOCIN) IVPB 1000 mg/200 mL premix     1,000 mg 200 mL/hr over 60 Minutes Intravenous  Once 01/09/16 1807 01/09/16 1957       Objective: Vitals:   01/12/16 0500 01/12/16 1315 01/12/16 2005 01/13/16 0533  BP:  125/72 (!) 143/87 132/87  Pulse:  96 (!) 102 93  Resp:  (!) 24 (!) 22 20  Temp:  98.8 F (37.1 C) 99.1 F (37.3 C) 98.6 F (37 C)  TempSrc:  Oral Oral Oral  SpO2:  99%  99% 100%  Weight: 87.1 kg (192 lb 1.6 oz)   90.8 kg (200 lb 3.2 oz)  Height:        Intake/Output Summary (Last 24 hours) at 01/13/16 1256 Last data filed at 01/13/16 1100  Gross per 24 hour  Intake              860 ml  Output             1000 ml  Net             -140 ml   Filed Weights   01/10/16 0424 01/12/16 0500 01/13/16 0533  Weight: 88.2 kg (194 lb 8 oz) 87.1 kg (192 lb 1.6 oz) 90.8 kg (200 lb 3.2 oz)    Examination: General exam: Appears comfortable  HEENT: PERRLA, oral mucosa moist, no sclera icterus or thrush Respiratory system: Clear to auscultation. Respiratory effort normal. Cardiovascular system: S1 & S2 heard, RRR.  No murmurs  Gastrointestinal system: Abdomen soft, non-tender, nondistended. Normal  bowel sound. No organomegaly Central nervous system: Alert and oriented. No focal neurological deficits. Extremities: No cyanosis, clubbing or edema Skin: No rashes or ulcers Psychiatry:  Mood & affect appropriate.     Data Reviewed: I have personally reviewed following labs and imaging studies  CBC:  Recent Labs Lab 01/09/16 1700 01/10/16 0440 01/11/16 1224 01/13/16 0506  WBC 22.4* 18.7* 13.6* 11.3*  NEUTROABS  --  13.0*  --   --   HGB 10.3* 9.0* 8.7* 8.8*  HCT 31.5* 27.8* 27.2* 27.5*  MCV 80.8 81.5 81.7 81.8  PLT 453* 417* 428* [id number]*   Basic Metabolic Panel:  Recent Labs Lab 01/09/16 1700 01/10/16 0440 01/12/16 0449 01/13/16 0506  NA 131* 132* 133* 135  K 3.4* 3.5 3.5 3.5  CL 96* 102 101 102  CO2 23 21* 26 27  GLUCOSE 119* 90 125* 150*  BUN 14 12 7 8   CREATININE 0.99 0.64 0.72 0.66  CALCIUM 9.9 8.9 8.9 9.5   GFR: Estimated Creatinine Clearance: 75.4 mL/min (by C-G formula based on SCr of 0.8 mg/dL). Liver Function Tests:  Recent Labs Lab 01/09/16 1700 01/10/16 0440 01/12/16 0449 01/13/16 0506  AST 60* 46* 37 32  ALT 17 14 15 15   ALKPHOS 105 88 89 101  BILITOT 1.3* 1.1 0.8 0.8  PROT 8.1 7.2 7.0 7.2  ALBUMIN 3.1* 2.6* 2.3* 2.4*    Recent Labs Lab 01/09/16 1700  LIPASE 26   No results for input(s): AMMONIA in the last 168 hours. Coagulation Profile: No results for input(s): INR, PROTIME in the last 168 hours. Cardiac Enzymes:  Recent Labs Lab 01/09/16 2232  TROPONINI <0.03   BNP (last 3 results) No results for input(s): PROBNP in the last 8760 hours. HbA1C: No results for input(s): HGBA1C in the last 72 hours. CBG:  Recent Labs Lab 01/12/16 0729 01/12/16 1312 01/12/16 1706 01/12/16 2118 01/13/16 0745  GLUCAP 126* 209* 113* 163* 159*   Lipid Profile: No results for input(s): CHOL, HDL, LDLCALC, TRIG, CHOLHDL, LDLDIRECT in the last 72 hours. Thyroid Function Tests:  Recent Labs  01/13/16 0506  TSH 1.217  FREET4 1.12    Anemia Panel: No results for input(s): VITAMINB12, FOLATE, FERRITIN, TIBC, IRON, RETICCTPCT in the last 72 hours. Urine analysis:    Component Value Date/Time   COLORURINE ORANGE (A) 01/09/2016 1954   APPEARANCEUR HAZY (A) 01/09/2016 1954   LABSPEC 1.021 01/09/2016 1954   PHURINE 5.5 01/09/2016 [place] NEGATIVE 01/09/2016 1954  HGBUR NEGATIVE 01/09/2016 1954   BILIRUBINUR SMALL (A) 01/09/2016 [place] NEGATIVE 01/09/2016 1954   PROTEINUR NEGATIVE 01/09/2016 1954   NITRITE POSITIVE (A) 01/09/2016 1954   LEUKOCYTESUR SMALL (A) 01/09/2016 1954   Sepsis Labs: @LABRCNTIP (procalcitonin:4,lacticidven:4) ) Recent Results (from the past 240 hour(s))  Blood culture (routine x 2)     Status: None (Preliminary result)   Collection Time: 01/09/16  6:19 PM  Result Value Ref Range Status   Specimen Description BLOOD RIGHT FOREARM  Final   Special Requests BOTTLES DRAWN AEROBIC ONLY 5CC  Final   Culture   Final    NO GROWTH 3 DAYS Performed at [facility]    Report Status PENDING  Incomplete  Blood culture (routine x 2)     Status: None (Preliminary result)   Collection Time: 01/09/16  6:44 PM  Result Value Ref Range Status   Specimen Description BLOOD RIGHT ANTECUBITAL  Final   Special Requests BOTTLES DRAWN AEROBIC AND ANAEROBIC 5CC  Final   Culture   Final    NO GROWTH 3 DAYS Performed at [facility]    Report Status PENDING  Incomplete  Urine culture     Status: None   Collection Time: 01/09/16  7:54 PM  Result Value Ref Range Status   Specimen Description URINE, RANDOM  Final   Special Requests NONE  Final   Culture NO GROWTH Performed at [facility]   Final   Report Status 01/11/2016 FINAL  Final         Radiology Studies: Ct Chest W Contrast  Result Date: 01/12/2016 CLINICAL DATA:  Weakness and lightheadedness. Fever. History of colon cancer. EXAM: CT CHEST WITH CONTRAST TECHNIQUE: Multidetector CT imaging of the chest was  performed during intravenous contrast administration. CONTRAST:  75 ISOVUE-300 IOPAMIDOL (ISOVUE-300) INJECTION 61% COMPARISON:  CT abdomen pelvis - 01/10/2016 FINDINGS: Cardiovascular: Borderline cardiomegaly. No pericardial effusion. Enlarged caliber the main pulmonary artery measuring 3.7 cm in diameter. Although this examination was not tailored for the evaluation the pulmonary arteries, there are no discrete filling defects within the central pulmonary arterial tree to suggest central pulmonary embolism. Fusiform ectasia of the ascending thoracic aorta measuring 3.8 cm in diameter. The thoracic aorta tapers to a normal caliber at the level of the aortic arch. The left vertebral artery is noted to arise directly from the aortic arch. Otherwise, conventional configuration of the aortic arch. No definite thoracic aortic dissection or periaortic stranding. Mediastinum/Nodes: Minimal mediastinal adenopathy with index precarinal lymph node measuring approximately 1 cm in greatest short axis diameter (image 47, series 2) and index sub carinal lymph node measuring approximately 1.1 cm (image 52, series 2). No definitive bulky hilar adenopathy. No axillary adenopathy. Lungs/Pleura: Evaluation of the pulmonary parenchyma is degraded secondary to patient respiratory artifact. Multiple pulmonary nodules are seen bilaterally with dominant nodule within the right upper lobe measuring approximately 1.3 x 1.0 cm (image 36, series 4) and dominant nodule within the left upper lobe measuring approximately 1.0 x 1.2 cm (image 65, series 4), findings compatible with metastatic disease. A total of 5 pulmonary nodules / metastases are seen within the left upper lobe while 6 metastases are seen within the right upper lobe. Trace right-sided pleural effusion. There is minimal subsegmental atelectasis within the right lower lobe and about the right costophrenic angle. The central airways appear patent given patient respiratory artifact.  No pneumothorax. Upper Abdomen: Limited evaluation of the upper abdomen re- demonstrates innumerable hepatic metastasis with confluent mass  nearly replacing the anterior segment of the right lobe of liver measuring approximately 10.7 x 6.9 cm. Musculoskeletal: Regional soft tissues appear normal. Normal appearance of the thyroid gland. No definite acute or aggressive osseous abnormalities. Stigmata of DISH throughout the thoracic spine. IMPRESSION: 1. Trace right-sided pleural effusion and right lower lobe atelectasis. Otherwise, no definite acute cardiopulmonary disease on this respiratory degraded examination. 2. Findings compatible with metastatic disease to the chest with multiple bilateral pulmonary nodules and associated suspected mediastinal adenopathy. 3. Re- demonstrated known hepatic metastases within the imaged upper abdomen. 4. Borderline cardiomegaly and enlarged caliber of the main pulmonary artery, nonspecific though could be seen in the setting of pulmonary arterial hypertension. Electronically Signed   By: Sandi Mariscal M.D.   On: 01/12/2016 21:00     Scheduled Meds: . amoxicillin-clavulanate  1 tablet Oral Q12H  . aspirin EC  81 mg Oral Daily  . enoxaparin (LOVENOX) injection  40 mg Subcutaneous QHS  . fluticasone  2 spray Each Nare Daily  . insulin aspart  0-5 Units Subcutaneous QHS  . insulin aspart  0-9 Units Subcutaneous TID WC  . levothyroxine  150 mcg Oral QAC breakfast  . sodium chloride flush  3 mL Intravenous Q12H   Continuous Infusions:     LOS: 4 days    Time spent in minutes: 46    [place], MD [facility] Pager: [email or web address] Password [email or web address] 01/13/2016, 12:56 PM

## 2016-01-13 NOTE — Progress Notes (Signed)
Went over all discharge information with patient and family.  All questions answered.  Discharge paperwork given to patient husband.  VSS.  Educated on importance of taking medications as prescribed and going to follow up appointments.

## 2016-01-13 NOTE — Progress Notes (Signed)
Patient ID: Judith Stephens, female   DOB: February 04, 1941, 75 y.o.   MRN: [id number] Aware of request for US biopsy of liver lesion.  The case has been d/w Dr. Earleen Newport.  Due to the procedure case load on Friday, we will not be able to do this at that time.  The patient is otherwise stable for DC according to the primary service.  I have spoken with Dr. Benay Spice who is ok for [place] to proceed with this biopsy on Monday.  We will have the patient arrive by 0800am Monday morning for her biopsy to be done at 1000am.    Jahzier Villalon E 2:14 PM 01/13/2016

## 2016-01-13 NOTE — Discharge Summary (Signed)
Physician Discharge Summary  Judith Stephens MHD:[id number] DOB: October 11, 1940 DOA: 01/09/2016  PCP: Donnie Coffin, MD  Admit date: 01/09/2016 Discharge date: 01/13/2016  Admitted From: home with husband Disposition:  Home with husband   Recommendations for Outpatient Follow-up:  1. IR guided Liver met biopsy on Monday 10 am 2. Anemia panel needs to be done as outpt 3. Follow for fevers 4. Check for recurrent leukocytosis after antibiotics stopped 5. BP check - resume Enalapril/ HCTZ if needed 6. 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   Brief Summary: 75 y/o with HTN, hypothyroidism, adenocarcinoma of colon s/p right hemicolectomy and Xeloda presented to the hospital for feeling weak, light headed for 3 wks. Found to have a fever of 102 in ER. No complaints of fevers at home. No source for fever found but found to have metastasis in the liver and nodule in lungs.    Hospital Course:    Principal Problem:   Sepsis ([place]) - fever and WBC count of 22 - source still undetermined- mildly positive UA but asymptomatic- U culture negative - blood cx x 2 negative - stopped IV Zosyn and Vanc and transitioned to oral Augmentin on 7/26- continues to do well- will complete 3 more days for a total of 8 days  Active Problems: Liver metastasis - h/o colon cancer - I have notified her oncologist - obtained CT chest as lung nodule also noted on CT abd- has multiple lung nodules -- findings discussed with patient - Dr Ammie Dalton has ordered and  ultrasound guided liver biopsy which will be done as outpt on Monday    Anemia - Iron deficiency in the past due to GI bleed from cancer- Iron stopped in 2014 - needs anemia panel as outpt    hypoglyecmia with Diabetes mellitus  - A1c 6.5 - hypoglycemic on admission and required D10 - now hyperglycemic on D 10 - As her PO intake is poor, will hold Glipizide for now and ask her to monitor sugars closely  Anorexia and weight loss - likely due to recurrence of cancer-advised to increase oral intake- she is eating well now  HTN - norvasc - hold enalapril and HCTZ    Hyponatremia - mild- possibly due to HCTZ    Hypokalemia - resolved   Hypothyroid - low TSH- rechecked TSH and checked Fr T4- both normal     Discharge Instructions  Discharge Instructions    Discharge instructions    Complete by:  As directed   Diabetic heart health diet- do not take Glipizide for now but check your sugars 3 x day with meals and check with your doctor in 1 wk about resuming the Glipizide - hold off on taking Enalapril/ HCTZ- see your doctor in 1 wk for a BP check   Increase activity slowly    Complete by:  As directed       Medication List    STOP taking these medications   enalapril-hydrochlorothiazide 10-25 MG tablet Commonly known as:  VASERETIC  GLIPIZIDE XL 10 MG 24 hr tablet Generic drug:  glipiZIDE     TAKE these medications   amLODipine 10 MG tablet Commonly known as:  NORVASC Take 10 mg by mouth daily.   amoxicillin-clavulanate 875-125 MG tablet Commonly known as:  AUGMENTIN Take 1 tablet by mouth every 12 (twelve) hours.   aspirin EC 81 MG tablet Take 81 mg by mouth daily.   fluticasone 50 MCG/ACT nasal spray Commonly known as:  FLONASE Place 2 sprays into the nose daily as needed for rhinitis. For allergies   levothyroxine 150 MCG tablet Commonly known as:  SYNTHROID, LEVOTHROID Take 150 mcg by mouth daily before breakfast.   loratadine 10 MG  tablet Commonly known as:  CLARITIN Take 10 mg by mouth daily as needed for allergies.   rosuvastatin 10 MG tablet Commonly known as:  CRESTOR Take 10 mg by mouth every Monday, Wednesday, and Friday.       No Known Allergies   Procedures/Studies:  Dg Chest 2 View  Result Date: 01/09/2016 CLINICAL DATA:  75 year old female with cough and right chest pain. EXAM: CHEST  2 VIEW COMPARISON:  03/05/2012 abdominal CT FINDINGS: Cardiomegaly noted. Elevation of the right hemidiaphragm again noted. Mild bibasilar atelectasis is present. There is no evidence of focal airspace disease, pulmonary edema, suspicious pulmonary nodule/mass, pleural effusion, or pneumothorax. No acute bony abnormalities are identified. IMPRESSION: Cardiomegaly with mild bibasilar atelectasis. Electronically Signed   By: Margarette Canada M.D.   On: 01/09/2016 19:26  Ct Head Wo Contrast  Result Date: 01/09/2016 CLINICAL DATA:  75 year old female with dizziness and headache for several days. EXAM: CT HEAD WITHOUT CONTRAST TECHNIQUE: Contiguous axial images were obtained from the base of the skull through the vertex without intravenous contrast. COMPARISON:  None. FINDINGS: Mild generalized cerebral volume loss noted. No acute intracranial abnormalities are identified, including mass lesion or mass effect, hydrocephalus, extra-axial fluid collection, midline shift, hemorrhage, or acute infarction. The visualized bony calvarium is unremarkable. IMPRESSION: No evidence of acute intracranial abnormality. Mild atrophy. Electronically Signed   By: Margarette Canada M.D.   On: 01/09/2016 19:28  Ct Chest W Contrast  Result Date: 01/12/2016 CLINICAL DATA:  Weakness and lightheadedness. Fever. History of colon cancer. EXAM: CT CHEST WITH CONTRAST TECHNIQUE: Multidetector CT imaging of the chest was performed during intravenous contrast administration. CONTRAST:  75 ISOVUE-300 IOPAMIDOL (ISOVUE-300) INJECTION 61% COMPARISON:  CT abdomen pelvis -  01/10/2016 FINDINGS: Cardiovascular: Borderline cardiomegaly. No pericardial effusion. Enlarged caliber the main pulmonary artery measuring 3.7 cm in diameter. Although this examination was not tailored for the evaluation the pulmonary arteries, there are no discrete filling defects within the central pulmonary arterial tree to suggest central pulmonary embolism. Fusiform ectasia of the ascending thoracic aorta measuring 3.8 cm in diameter. The thoracic aorta tapers to a normal caliber at the level of the aortic arch. The left vertebral artery is noted to arise directly from the aortic arch. Otherwise, conventional configuration of the aortic arch. No definite thoracic aortic dissection or periaortic stranding. Mediastinum/Nodes: Minimal mediastinal adenopathy with index precarinal lymph node measuring approximately 1 cm in greatest short axis diameter (image 47, series 2) and index sub carinal lymph node measuring approximately 1.1 cm (image 52, series 2). No definitive bulky hilar adenopathy. No axillary adenopathy. Lungs/Pleura: Evaluation of the pulmonary parenchyma is degraded secondary to patient respiratory artifact. Multiple pulmonary nodules are seen bilaterally with dominant nodule within the right upper lobe measuring approximately 1.3 x 1.0 cm (image 36, series 4) and dominant  nodule within the left upper lobe measuring approximately 1.0 x 1.2 cm (image 65, series 4), findings compatible with metastatic disease. A total of 5 pulmonary nodules / metastases are seen within the left upper lobe while 6 metastases are seen within the right upper lobe. Trace right-sided pleural effusion. There is minimal subsegmental atelectasis within the right lower lobe and about the right costophrenic angle. The central airways appear patent given patient respiratory artifact. No pneumothorax. Upper Abdomen: Limited evaluation of the upper abdomen re- demonstrates innumerable hepatic metastasis with confluent mass nearly  replacing the anterior segment of the right lobe of liver measuring approximately 10.7 x 6.9 cm. Musculoskeletal: Regional soft tissues appear normal. Normal appearance of the thyroid gland. No definite acute or aggressive osseous abnormalities. Stigmata of DISH throughout the thoracic spine. IMPRESSION: 1. Trace right-sided pleural effusion and right lower lobe atelectasis. Otherwise, no definite acute cardiopulmonary disease on this respiratory degraded examination. 2. Findings compatible with metastatic disease to the chest with multiple bilateral pulmonary nodules and associated suspected mediastinal adenopathy. 3. Re- demonstrated known hepatic metastases within the imaged upper abdomen. 4. Borderline cardiomegaly and enlarged caliber of the main pulmonary artery, nonspecific though could be seen in the setting of pulmonary arterial hypertension. Electronically Signed   By: Sandi Mariscal M.D.   On: 01/12/2016 21:00  Ct Abdomen Pelvis W Contrast  Result Date: 01/10/2016 CLINICAL DATA:  Generalized weakness. Abdominal discomfort. Remote history of colon cancer. EXAM: CT ABDOMEN AND PELVIS WITH CONTRAST TECHNIQUE: Multidetector CT imaging of the abdomen and pelvis was performed using the standard protocol following bolus administration of intravenous contrast. CONTRAST:  157m ISOVUE-300 IOPAMIDOL (ISOVUE-300) INJECTION 61% COMPARISON:  CT 03/05/2012.  MRI 03/12/2012. FINDINGS: Lower chest: Lingular nodule measures 12 mm on the first image. Right base atelectasis. Heart is upper limits normal in size. Hepatobiliary: Extensive new low-density lesions throughout the liver concerning for metastatic disease. Conglomerate mass in the right hepatic lobe measures up to 8.6 cm. This large mass also demonstrates an area of a amorphous calcification centrally. Innumerable other smaller hepatic lesions. Gallstones noted within the gallbladder. Pancreas: No focal abnormality or ductal dilatation. Spleen: No focal  abnormality.  Normal size. Adrenals/Urinary Tract: Cysts within the right kidney which appear benign. Nonobstructing stone in the mid to lower pole. No hydronephrosis. Adrenal glands and urinary bladder unremarkable. Stomach/Bowel: Prior right hemicolectomy. No evidence of bowel obstruction. Stomach and small bowel decompressed. Vascular/Lymphatic: Aorta is normal caliber. Scattered aortic calcifications. Small scattered retroperitoneal lymph nodes, none pathologically enlarged. Enlarged porta hepatis and peripancreatic lymph nodes in the right upper quadrant. Index node on image 37 has a short axis diameter of 14 mm in appears partially necrotic. Reproductive: Small low-density areas within the left ovary, likely small cysts, the largest 2.3 cm. Uterus and right ovary unremarkable. Other: Trace interloop free fluid noted in the right abdomen. Trace free fluid in the pelvis. No free air. Musculoskeletal: No acute bony abnormality or focal bone lesion. IMPRESSION: Innumerable hepatic low-density lesions and masses compatible with metastatic disease. Cholelithiasis. Mildly enlarged right upper quadrant/porta hepatis lymph nodes, some which appear necrotic. Prior right hemicolectomy. 12 mm nodule in the lingula concerning for metastasis. Bibasilar atelectasis. Electronically Signed   By: KRolm BaptiseM.D.   On: 01/10/2016 16:07      Discharge Exam: Vitals:   01/13/16 0533 01/13/16 1354  BP: 132/87 131/78  Pulse: 93 81  Resp: 20 20  Temp: 98.6 F (37 C) 98 F (36.7 C)   Vitals:  01/12/16 1315 01/12/16 2005 01/13/16 0533 01/13/16 1354  BP: 125/72 (!) 143/87 132/87 131/78  Pulse: 96 (!) 102 93 81  Resp: (!) 24 (!) _0 Temp: 98.8 F (37.1 C) 99.1 F (37.3 C) 98.6 F (37 C) 98 F (36.7 C)  TempSrc: Oral Oral Oral Oral  SpO2: 99% 99% 100% 100%  Weight:   90.8 kg (200 lb 3.2 oz)   Height:        General: Pt is alert, awake, not in acute distress Cardiovascular: RRR, S1/S2 +, no rubs, no  gallops Respiratory: CTA bilaterally, no wheezing, no rhonchi Abdominal: Soft, NT, ND, bowel sounds + Extremities: no edema, no cyanosis    The results of significant diagnostics from this hospitalization (including imaging, microbiology, ancillary and laboratory) are listed below for reference.     Microbiology: Recent Results (from the past 240 hour(s))  Blood culture (routine x 2)     Status: None (Preliminary result)   Collection Time: 01/09/16  6:19 PM  Result Value Ref Range Status   Specimen Description BLOOD RIGHT FOREARM  Final   Special Requests BOTTLES DRAWN AEROBIC ONLY 5CC  Final   Culture   Final    NO GROWTH 3 DAYS Performed at [facility]    Report Status PENDING  Incomplete  Blood culture (routine x 2)     Status: None (Preliminary result)   Collection Time: 01/09/16  6:44 PM  Result Value Ref Range Status   Specimen Description BLOOD RIGHT ANTECUBITAL  Final   Special Requests BOTTLES DRAWN AEROBIC AND ANAEROBIC 5CC  Final   Culture   Final    NO GROWTH 3 DAYS Performed at [facility]    Report Status PENDING  Incomplete  Urine culture     Status: None   Collection Time: 01/09/16  7:54 PM  Result Value Ref Range Status   Specimen Description URINE, RANDOM  Final   Special Requests NONE  Final   Culture NO GROWTH Performed at [facility]   Final   Report Status 01/11/2016 FINAL  Final     Labs: BNP (last 3 results) No results for input(s): BNP in the last 8760 hours. Basic Metabolic Panel:  Recent Labs Lab 01/09/16 1700 01/10/16 0440 01/12/16 0449 01/13/16 0506  NA 131* 132* 133* 135  K 3.4* 3.5 3.5 3.5  CL 96* 102 101 102  CO2 23 21* 26 27  GLUCOSE 119* 90 125* 150*  BUN _1 CREATININE 0.99 0.64 0.72 0.66  CALCIUM 9.9 8.9 8.9 9.5   Liver Function Tests:  Recent Labs Lab 01/09/16 1700 01/10/16 0440 01/12/16 0449 01/13/16 0506  AST 60* 46* 37 32  ALT _2 ALKPHOS 105 88 89 101  BILITOT  1.3* 1.1 0.8 0.8  PROT 8.1 7.2 7.0 7.2  ALBUMIN 3.1* 2.6* 2.3* 2.4*    Recent Labs Lab 01/09/16 1700  LIPASE 26   No results for input(s): AMMONIA in the last 168 hours. CBC:  Recent Labs Lab 01/09/16 1700 01/10/16 0440 01/11/16 1224 01/13/16 0506  WBC 22.4* 18.7* 13.6* 11.3*  NEUTROABS  --  13.0*  --   --   HGB 10.3* 9.0* 8.7* 8.8*  HCT 31.5* 27.8* 27.2* 27.5*  MCV 80.8 81.5 81.7 81.8  PLT 453* 417* 428* 581*   Cardiac Enzymes:  Recent Labs Lab 01/09/16 2232  TROPONINI <0.03   BNP: Invalid input(s): POCBNP CBG:  Recent Labs Lab 01/12/16 0729 01/12/16  1312 01/12/16 1706 01/12/16 2118 01/13/16 0745  GLUCAP 126* 209* 113* 163* 159*   D-Dimer No results for input(s): DDIMER in the last 72 hours. Hgb A1c No results for input(s): HGBA1C in the last 72 hours. Lipid Profile No results for input(s): CHOL, HDL, LDLCALC, TRIG, CHOLHDL, LDLDIRECT in the last 72 hours. Thyroid function studies  Recent Labs  01/13/16 0506  TSH 1.217   Anemia work up No results for input(s): VITAMINB12, FOLATE, FERRITIN, TIBC, IRON, RETICCTPCT in the last 72 hours. Urinalysis    Component Value Date/Time   COLORURINE ORANGE (A) 01/09/2016 1954   APPEARANCEUR HAZY (A) 01/09/2016 1954   LABSPEC 1.021 01/09/2016 1954   PHURINE 5.5 01/09/2016 1954   GLUCOSEU NEGATIVE 01/09/2016 1954   HGBUR NEGATIVE 01/09/2016 1954   BILIRUBINUR SMALL (A) 01/09/2016 1954   KETONESUR NEGATIVE 01/09/2016 1954   PROTEINUR NEGATIVE 01/09/2016 1954   NITRITE POSITIVE (A) 01/09/2016 1954   LEUKOCYTESUR SMALL (A) 01/09/2016 1954   Sepsis Labs Invalid input(s): PROCALCITONIN,  WBC,  LACTICIDVEN Microbiology Recent Results (from the past 240 hour(s))  Blood culture (routine x 2)     Status: None (Preliminary result)   Collection Time: 01/09/16  6:19 PM  Result Value Ref Range Status   Specimen Description BLOOD RIGHT FOREARM  Final   Special Requests BOTTLES DRAWN AEROBIC ONLY 5CC  Final    Culture   Final    NO GROWTH 3 DAYS Performed at [facility]    Report Status PENDING  Incomplete  Blood culture (routine x 2)     Status: None (Preliminary result)   Collection Time: 01/09/16  6:44 PM  Result Value Ref Range Status   Specimen Description BLOOD RIGHT ANTECUBITAL  Final   Special Requests BOTTLES DRAWN AEROBIC AND ANAEROBIC 5CC  Final   Culture   Final    NO GROWTH 3 DAYS Performed at [facility]    Report Status PENDING  Incomplete  Urine culture     Status: None   Collection Time: 01/09/16  7:54 PM  Result Value Ref Range Status   Specimen Description URINE, RANDOM  Final   Special Requests NONE  Final   Culture NO GROWTH Performed at [facility]   Final   Report Status 01/11/2016 FINAL  Final     Time coordinating discharge: Over 30 minutes  SIGNED:   Debbe Odea, MD  [facility] 01/13/2016, 2:18 PM Pager   If 7PM-7AM, please contact night-coverage [email or web address] Password [email or web address]

## 2016-01-13 NOTE — Progress Notes (Signed)
Physical Therapy Treatment Patient Details Name: Judith Stephens MRN: [id number] DOB: September 04, 1940 Today's Date: February 01, 2016    History of Present Illness 75 yo female admitted syncope, sepsis, new met liver disease. Hx of HTN, DM    PT Comments    Pt ambulating well in hallway, only slow pace.  Pt encouraged to ambulate with nursing staff again today.  Pt reports likely d/c home tomorrow.  Follow Up Recommendations  Supervision for mobility/OOB     Equipment Recommendations  None recommended by PT    Recommendations for Other Services       Precautions / Restrictions Precautions Precautions: Fall    Mobility  Bed Mobility Overal bed mobility: Independent                Transfers Overall transfer level: Needs assistance Equipment used: None Transfers: Sit to/from Stand Sit to Stand: Min guard            Ambulation/Gait Ambulation/Gait assistance: Min guard Ambulation Distance (Feet): 200 Feet Assistive device: None (pushed IV pole) Gait Pattern/deviations: Step-through pattern;Decreased stride length     General Gait Details: steady and no knee pain reported today, pt with slow pace   Stairs            Wheelchair Mobility    Modified Rankin (Stroke Patients Only)       Balance                                    Cognition Arousal/Alertness: Awake/alert Behavior During Therapy: WFL for tasks assessed/performed Overall Cognitive Status: Within Functional Limits for tasks assessed                      Exercises      General Comments        Pertinent Vitals/Pain Pain Assessment: 0-10 Pain Score: 5  Pain Location: low back Pain Descriptors / Indicators: Sore Pain Intervention(s): Monitored during session;Limited activity within patient's tolerance;Repositioned    Home Living                      Prior Function            PT Goals (current goals can now be found in the care plan section) Progress  towards PT goals: Progressing toward goals    Frequency  Min 3X/week    PT Plan Current plan remains appropriate    Co-evaluation             End of Session Equipment Utilized During Treatment: Gait belt Activity Tolerance: Patient tolerated treatment well Patient left: in chair;with call Judith Stephens/phone within reach;with chair alarm set     Time: 6440-3474 PT Time Calculation (min) (ACUTE ONLY): 18 min  Charges:  $Gait Training: 8-22 mins                    G Codes:      Judith Stephens,Judith Stephens Feb 01, 2016, 1:17 PM Judith Stephens, PT, DPT 02-01-2016 Pager: [phone]

## 2016-01-14 ENCOUNTER — Other Ambulatory Visit: Payer: Self-pay | Admitting: Radiology

## 2016-01-14 LAB — CEA: CEA: 31.3 ng/mL — ABNORMAL HIGH (ref 0.0–4.7)

## 2016-01-14 LAB — CULTURE, BLOOD (ROUTINE X 2)
CULTURE: NO GROWTH
CULTURE: NO GROWTH

## 2016-01-17 ENCOUNTER — Ambulatory Visit (HOSPITAL_COMMUNITY)
Admission: RE | Admit: 2016-01-17 | Discharge: 2016-01-17 | Disposition: A | Discharge status: Home / Self Care | Payer: Medicare Other | Source: Ambulatory Visit | Attending: Oncology | Admitting: Oncology

## 2016-01-17 ENCOUNTER — Encounter (HOSPITAL_COMMUNITY): Payer: Self-pay

## 2016-01-17 ENCOUNTER — Ambulatory Visit (HOSPITAL_COMMUNITY)
Admission: RE | Admit: 2016-01-17 | Discharge: 2016-01-17 | Disposition: A | Discharge status: Home / Self Care | Payer: Medicare Other | Source: Ambulatory Visit | Attending: General Surgery | Admitting: General Surgery

## 2016-01-17 DIAGNOSIS — E785 Hyperlipidemia, unspecified: Secondary | ICD-10-CM | POA: Insufficient documentation

## 2016-01-17 DIAGNOSIS — Z7982 Long term (current) use of aspirin: Secondary | ICD-10-CM | POA: Diagnosis not present

## 2016-01-17 DIAGNOSIS — M1990 Unspecified osteoarthritis, unspecified site: Secondary | ICD-10-CM | POA: Insufficient documentation

## 2016-01-17 DIAGNOSIS — C787 Secondary malignant neoplasm of liver and intrahepatic bile duct: Secondary | ICD-10-CM | POA: Diagnosis not present

## 2016-01-17 DIAGNOSIS — E039 Hypothyroidism, unspecified: Secondary | ICD-10-CM | POA: Diagnosis not present

## 2016-01-17 DIAGNOSIS — Z8249 Family history of ischemic heart disease and other diseases of the circulatory system: Secondary | ICD-10-CM | POA: Diagnosis not present

## 2016-01-17 DIAGNOSIS — R160 Hepatomegaly, not elsewhere classified: Secondary | ICD-10-CM | POA: Diagnosis not present

## 2016-01-17 DIAGNOSIS — E119 Type 2 diabetes mellitus without complications: Secondary | ICD-10-CM | POA: Diagnosis not present

## 2016-01-17 DIAGNOSIS — Z87891 Personal history of nicotine dependence: Secondary | ICD-10-CM | POA: Insufficient documentation

## 2016-01-17 DIAGNOSIS — I10 Essential (primary) hypertension: Secondary | ICD-10-CM | POA: Diagnosis not present

## 2016-01-17 DIAGNOSIS — Z85038 Personal history of other malignant neoplasm of large intestine: Secondary | ICD-10-CM | POA: Insufficient documentation

## 2016-01-17 DIAGNOSIS — I517 Cardiomegaly: Secondary | ICD-10-CM | POA: Diagnosis not present

## 2016-01-17 LAB — COMPREHENSIVE METABOLIC PANEL
ALK PHOS: 111 U/L (ref 38–126)
ALT: 15 U/L (ref 14–54)
AST: 38 U/L (ref 15–41)
Albumin: 2.6 g/dL — ABNORMAL LOW (ref 3.5–5.0)
Anion gap: 7 (ref 5–15)
BUN: 7 mg/dL (ref 6–20)
CALCIUM: 9.6 mg/dL (ref 8.9–10.3)
CHLORIDE: 103 mmol/L (ref 101–111)
CO2: 27 mmol/L (ref 22–32)
CREATININE: 0.5 mg/dL (ref 0.44–1.00)
GFR calc non Af Amer: >60 mL/min (ref >60)
GLUCOSE: 168 mg/dL — AB (ref 65–99)
Potassium: 3.5 mmol/L (ref 3.5–5.1)
SODIUM: 137 mmol/L (ref 135–145)
Total Bilirubin: 0.5 mg/dL (ref 0.3–1.2)
Total Protein: 7.7 g/dL (ref 6.5–8.1)

## 2016-01-17 LAB — CBC WITH DIFFERENTIAL/PLATELET
BASOS ABS: 0 10*3/uL (ref 0.0–0.1)
Basophils Relative: 0 %
EOS ABS: 0.1 10*3/uL (ref 0.0–0.7)
Eosinophils Relative: 1 %
HCT: 30.1 % — ABNORMAL LOW (ref 36.0–46.0)
HEMOGLOBIN: 9.3 g/dL — AB (ref 12.0–15.0)
LYMPHS ABS: 2.6 10*3/uL (ref 0.7–4.0)
LYMPHS PCT: 26 %
MCH: 25.4 pg — AB (ref 26.0–34.0)
MCHC: 30.9 g/dL (ref 30.0–36.0)
MCV: 82.2 fL (ref 78.0–100.0)
Monocytes Absolute: 1.1 10*3/uL — ABNORMAL HIGH (ref 0.1–1.0)
Monocytes Relative: 11 %
NEUTROS PCT: 62 %
Neutro Abs: 6.3 10*3/uL (ref 1.7–7.7)
Platelets: 743 10*3/uL — ABNORMAL HIGH (ref 150–400)
RBC: 3.66 MIL/uL — AB (ref 3.87–5.11)
RDW: 15.6 % — ABNORMAL HIGH (ref 11.5–15.5)
WBC: 10.1 10*3/uL (ref 4.0–10.5)

## 2016-01-17 LAB — GLUCOSE, CAPILLARY: Glucose-Capillary: 159 mg/dL — ABNORMAL HIGH (ref 65–99)

## 2016-01-17 LAB — PROTIME-INR
INR: 1.17
Prothrombin Time: 15 seconds (ref 11.4–15.2)

## 2016-01-17 MED ORDER — FENTANYL CITRATE (PF) 100 MCG/2ML IJ SOLN
INTRAMUSCULAR | Status: AC
  Filled 2016-01-17: qty 4

## 2016-01-17 MED ORDER — FENTANYL CITRATE (PF) 100 MCG/2ML IJ SOLN
INTRAMUSCULAR | Status: AC | PRN
  Administered 2016-01-17: 50 ug via INTRAVENOUS

## 2016-01-17 MED ORDER — MIDAZOLAM HCL 2 MG/2ML IJ SOLN
INTRAMUSCULAR | Status: AC
  Filled 2016-01-17: qty 6

## 2016-01-17 MED ORDER — SODIUM CHLORIDE 0.9 % IV SOLN: INTRAVENOUS | Status: DC

## 2016-01-17 MED ORDER — MIDAZOLAM HCL 2 MG/2ML IJ SOLN
INTRAMUSCULAR | Status: AC | PRN
  Administered 2016-01-17: 0.5 mg via INTRAVENOUS
  Administered 2016-01-17 (×2): 1 mg via INTRAVENOUS

## 2016-01-17 NOTE — Procedures (Signed)
Technically successful US guided biopsy of indeterminate mass within the caudal aspect of the right lobe of the liver.   EBL: Minimal   No immediate complications.   Ronny Bacon, MD Pager #: [phone]

## 2016-01-17 NOTE — Discharge Instructions (Signed)
Liver Biopsy °The liver is a large organ in the upper right-hand side of your abdomen. A liver biopsy is a procedure in which a tissue sample is taken from the liver and examined under a microscope. The procedure is done to confirm a suspected problem. °There are three types of liver biopsies: °· Percutaneous. In this type, an incision is made in your abdomen. The sample is removed through the incision with a needle. °· Laparoscopic. In this type, several incisions are made in the abdomen. A tiny camera is passed through one of the incisions to help guide the health care provider. The sample is removed through the other incision or incisions. °· Transjugular. In this type, an incision is made in the neck. A tube is passed through the incision to the liver. The sample is removed through the tube with a needle. °LET YOUR HEALTH CARE PROVIDER KNOW ABOUT: °· Any allergies you have. °· All medicines you are taking, including vitamins, herbs, eye drops, creams, and over-the-counter medicines. °· Previous problems you or members of your family have had with the use of anesthetics. °· Any blood disorders you have. °· Previous surgeries you have had. °· Medical conditions you have. °· Possibility of pregnancy, if this applies. °RISKS AND COMPLICATIONS °Generally, this is a safe procedure. However, problems can occur and include: °· Bleeding. °· Infection. °· Bruising. °· Collapsed lung. °· Leak of digestive juices (bile) from the liver or gallbladder. °· Problems with heart rhythm. °· Pain at the biopsy site or in the right shoulder. °· Low blood pressure (hypotension). °· Injury to nearby organs or tissues. °BEFORE THE PROCEDURE °· Your health care provider may do some blood or urine tests. These will help your health care provider learn how well your kidneys and liver are working and how well your blood clots. °· Ask your health care provider if you will be able to go home the day of the procedure. Arrange for someone to  take you home and stay with you for at least 24 hours. °· Do not eat or drink anything after midnight on the night before the procedure or as directed by your health care provider. °· Ask your health care provider about: °· Changing or stopping your regular medicines. This is especially important if you are taking diabetes medicines or blood thinners. °· Taking medicines such as aspirin and ibuprofen. These medicines can thin your blood. Do not take these medicines before your procedure if your health care provider asks you not to. °PROCEDURE °Regardless of the type of biopsy that will be done, you will have an IV line placed. Through this line, you will receive fluids and medicine to relax you. If you will be having a laparoscopic biopsy, you may also receive medicine through this line to make you sleep during the procedure (general anesthetic). °Percutaneous Liver Biopsy °· You will positioned on your back, with your right hand over your head. °· A health care provider will locate your liver by tapping and pressing on the right side of your abdomen or with the help of an ultrasound machine or CT scan. °· An area at the bottom of your last right rib will be numbed. °· An incision will be made in the numbed area. °· The biopsy needle will be inserted into the incision. °· Several samples of liver tissue will be taken with the biopsy needle. You will be asked to hold your breath as each sample is taken. °Laparoscopic Liver Biopsy °· You will be   positioned on your back. °· Several small incisions will be made in your abdomen. °· Your doctor will pass a tiny camera through one incision. The camera will allow the liver to be viewed on a TV monitor in the operating room. °· Tools will be passed through the other incision or incisions. These tools will be used to remove samples of liver tissue. °Transjugular Liver Biopsy °· You will be positioned on your back on an X-ray table, with your head turned to your left. °· An  area on your neck just over your jugular vein will be numbed. °· An incision will be made in the numbed area. °· A tiny tube will be inserted through the incision. It will be pushed through the jugular vein to a blood vessel in the liver called the hepatic vein. °· Dye will be inserted through the tube, and X-rays will be taken. The dye will make the blood vessels in the liver light up on the X-rays. °· The biopsy needle will be pushed through the tube until it reaches the liver. °· Samples of liver tissue will be taken with the biopsy needle. °· The needle and the tube will be removed. °After the samples are obtained, the incision or incisions will be closed. °AFTER THE PROCEDURE °· You will be taken to a recovery area. °· You may have to lie on your right side for 1-2 hours. This will prevent bleeding from the biopsy site. °· Your progress will be watched. Your blood pressure, pulse, and the biopsy site will be checked often. °· You may have some pain or feel sick. If this happens, tell your health care provider. °· As you begin to feel better, you will be offered ice and beverages. °· You may be allowed to go home when the medicines have worn off and you can walk, drink, eat, and use the bathroom. °  °This information is not intended to replace advice given to you by your health care provider. Make sure you discuss any questions you have with your health care provider. °  °Document Released: 08/26/2003 Document Revised: 06/26/2014 Document Reviewed: 08/01/2013 °Elsevier Interactive Patient Education ©2016 [employer] °Liver Biopsy, Care After °Refer to this sheet in the next few weeks. These instructions provide you with information on caring for yourself after your procedure. Your health care provider may also give you more specific instructions. Your treatment has been planned according to current medical practices, but problems sometimes occur. Call your health care provider if you have any problems or  questions after your procedure. °WHAT TO EXPECT AFTER THE PROCEDURE °After your procedure, it is typical to have the following: °· A small amount of discomfort in the area where the biopsy was done and in the right shoulder or shoulder blade. °· A small amount of bruising around the area where the biopsy was done and on the skin over the liver. °· Sleepiness and fatigue for the rest of the day. °HOME CARE INSTRUCTIONS  °· Rest at home for 1-2 days or as directed by your health care provider. °· Have a friend or family member stay with you for at least 24 hours. °· Because of the medicines used during the procedure, you should not do the following things in the first 24 hours: °¨ Drive. °¨ Use machinery. °¨ Be responsible for the care of other people. °¨ Sign legal documents. °¨ Take a bath or shower. °· There are many different ways to close and cover an incision, including stitches,   skin glue, and adhesive strips. Follow your health care provider's instructions on: °¨ Incision care. °¨ Bandage (dressing) changes and removal. °¨ Incision closure removal. °· Do not drink alcohol in the first week. °· Do not lift more than 5 pounds or play contact sports for 2 weeks after this test. °· Take medicines only as directed by your health care provider. Do not take medicine containing aspirin or non-steroidal anti-inflammatory medicines such as ibuprofen for 1 week after this test. °· It is your responsibility to get your test results. °SEEK MEDICAL CARE IF:  °· You have increased bleeding from an incision that results in more than a small spot of blood. °· You have redness, swelling, or increasing pain in any incisions. °· You notice a discharge or a bad smell coming from any of your incisions. °· You have a fever or chills. °SEEK IMMEDIATE MEDICAL CARE IF:  °· You develop swelling, bloating, or pain in your abdomen. °· You become dizzy or faint. °· You develop a rash. °· You are nauseous or vomit. °· You have difficulty  breathing, feel short of breath, or feel faint. °· You develop chest pain. °· You have problems with your speech or vision. °· You have trouble balancing or moving your arms or legs. °  °This information is not intended to replace advice given to you by your health care provider. Make sure you discuss any questions you have with your health care provider. °  °Document Released: 12/23/2004 Document Revised: 06/26/2014 Document Reviewed: 08/01/2013 °Elsevier Interactive Patient Education ©2016 [employer] °Moderate Conscious Sedation, Adult, Care After °Refer to this sheet in the next few weeks. These instructions provide you with information on caring for yourself after your procedure. Your health care provider may also give you more specific instructions. Your treatment has been planned according to current medical practices, but problems sometimes occur. Call your health care provider if you have any problems or questions after your procedure. °WHAT TO EXPECT AFTER THE PROCEDURE  °After your procedure: °· You may feel sleepy, clumsy, and have poor balance for several hours. °· Vomiting may occur if you eat too soon after the procedure. °HOME CARE INSTRUCTIONS °· Do not participate in any activities where you could become injured for at least 24 hours. Do not: °¨ Drive. °¨ Swim. °¨ Ride a bicycle. °¨ Operate heavy machinery. °¨ Cook. °¨ Use power tools. °¨ Climb ladders. °¨ Work from a high place. °· Do not make important decisions or sign legal documents until you are improved. °· If you vomit, drink water, juice, or soup when you can drink without vomiting. Make sure you have little or no nausea before eating solid foods. °· Only take over-the-counter or prescription medicines for pain, discomfort, or fever as directed by your health care provider. °· Make sure you and your family fully understand everything about the medicines given to you, including what side effects may occur. °· You should not drink alcohol,  take sleeping pills, or take medicines that cause drowsiness for at least 24 hours. °· If you smoke, do not smoke without supervision. °· If you are feeling better, you may resume normal activities 24 hours after you were sedated. °· Keep all appointments with your health care provider. °SEEK MEDICAL CARE IF: °· Your skin is pale or bluish in color. °· You continue to feel nauseous or vomit. °· Your pain is getting worse and is not helped by medicine. °· You have bleeding or swelling. °· You are   still sleepy or feeling clumsy after 24 hours. °SEEK IMMEDIATE MEDICAL CARE IF: °· You develop a rash. °· You have difficulty breathing. °· You develop any type of allergic problem. °· You have a fever. °MAKE SURE YOU: °· Understand these instructions. °· Will watch your condition. °· Will get help right away if you are not doing well or get worse. °  °This information is not intended to replace advice given to you by your health care provider. Make sure you discuss any questions you have with your health care provider. °  °Document Released: 03/26/2013 Document Revised: 06/26/2014 Document Reviewed: 03/26/2013 °Elsevier Interactive Patient Education ©2016 [employer] ° °

## 2016-01-17 NOTE — H&P (Signed)
Chief Complaint: Patient was seen in consultation today for liver lesion biopsy at the request of Dr. Betsy Coder  Referring Physician(s): Dr. Betsy Coder  Supervising Physician: Sandi Mariscal  Patient Status: Outpatient  History of Present Illness: Judith Stephens is a 75 y.o. female with hx of colon cancer, now found to have multiple liver lesions concerning for mets. IR is requested to obtain biopsy PMHx, meds, labs, imaging, allergies reviewed. Has been NPO this am Husband at bedside.  Past Medical History:  Diagnosis Date  . Anemia    hx of blood transfusion 03/06/12   . Arthritis   . Blood transfusion   . Diabetes mellitus   . Hyperlipidemia   . Hypertension   . Hypothyroidism     Past Surgical History:  Procedure Laterality Date  . LAPAROSCOPY  03/14/2012   Procedure: LAPAROSCOPY DIAGNOSTIC;  Surgeon: Adin Hector, MD;  Location: [facility];  Service: General;;  right colectomy  . TUBAL LIGATION      Allergies: Review of patient's allergies indicates no known allergies.  Medications: Prior to Admission medications   Medication Sig Start Date End Date Taking? Authorizing Provider  amLODipine (NORVASC) 10 MG tablet Take 10 mg by mouth daily.    Yes Historical Provider, MD  amoxicillin-clavulanate (AUGMENTIN) 875-125 MG tablet Take 1 tablet by mouth every 12 (twelve) hours. 01/13/16  Yes Debbe Odea, MD  levothyroxine (SYNTHROID, LEVOTHROID) 150 MCG tablet Take 150 mcg by mouth daily before breakfast.   Yes Historical Provider, MD  loratadine (CLARITIN) 10 MG tablet Take 10 mg by mouth daily as needed for allergies.    Yes Historical Provider, MD  aspirin EC 81 MG tablet Take 81 mg by mouth daily.     Historical Provider, MD  fluticasone (FLONASE) 50 MCG/ACT nasal spray Place 2 sprays into the nose daily as needed for rhinitis. For allergies    Historical Provider, MD  rosuvastatin (CRESTOR) 10 MG tablet Take 10 mg by mouth every Monday, Wednesday, and Friday.      Historical Provider, MD     Family History  Problem Relation Age of Onset  . Hypertension Father   . Hypertension Sister     x3  . Hypertension Brother     Social History   Social History  . Marital status: Married    Spouse name: N/A  . Number of children: N/A  . Years of education: N/A   Social History Main Topics  . Smoking status: Former Research scientist (life sciences)  . Smokeless tobacco: Never Used  . Alcohol use No     Comment: occasional wine   . Drug use: No  . Sexual activity: Not Asked   Other Topics Concern  . None   Social History Narrative   Married    Retired from [employer]: A 12 point ROS discussed and pertinent positives are indicated in the HPI above.  All other systems are negative.  Review of Systems  Vital Signs: BP 125/71 (BP Location: Left Arm)   Pulse 95   Temp 98.3 F (36.8 C) (Oral)   Resp 20   SpO2 98%   Physical Exam  Constitutional: She is oriented to person, place, and time. She appears well-developed and well-nourished. No distress.  HENT:  Head: Normocephalic.  Mouth/Throat: Oropharynx is clear and moist.  Neck: Normal range of motion. No JVD present. No tracheal deviation present.  Cardiovascular: Normal rate, regular rhythm and normal heart sounds.   Pulmonary/Chest: Effort  normal and breath sounds normal. No respiratory distress.  Abdominal: Soft. She exhibits no mass. There is no tenderness. There is no guarding.  Neurological: She is alert and oriented to person, place, and time.  Skin: Skin is warm and dry.  Psychiatric: She has a normal mood and affect. Judgment normal.    Mallampati Score:  MD Evaluation Airway: WNL Heart: WNL Abdomen: WNL Chest/ Lungs: WNL ASA  Classification: 2 Mallampati/Airway Score: Two  Imaging: Dg Chest 2 View  Result Date: 01/09/2016 CLINICAL DATA:  75 year old female with cough and right chest pain. EXAM: CHEST  2 VIEW COMPARISON:  03/05/2012 abdominal CT FINDINGS:  Cardiomegaly noted. Elevation of the right hemidiaphragm again noted. Mild bibasilar atelectasis is present. There is no evidence of focal airspace disease, pulmonary edema, suspicious pulmonary nodule/mass, pleural effusion, or pneumothorax. No acute bony abnormalities are identified. IMPRESSION: Cardiomegaly with mild bibasilar atelectasis. Electronically Signed   By: Margarette Canada M.D.   On: 01/09/2016 19:26  Ct Head Wo Contrast  Result Date: 01/09/2016 CLINICAL DATA:  75 year old female with dizziness and headache for several days. EXAM: CT HEAD WITHOUT CONTRAST TECHNIQUE: Contiguous axial images were obtained from the base of the skull through the vertex without intravenous contrast. COMPARISON:  None. FINDINGS: Mild generalized cerebral volume loss noted. No acute intracranial abnormalities are identified, including mass lesion or mass effect, hydrocephalus, extra-axial fluid collection, midline shift, hemorrhage, or acute infarction. The visualized bony calvarium is unremarkable. IMPRESSION: No evidence of acute intracranial abnormality. Mild atrophy. Electronically Signed   By: Margarette Canada M.D.   On: 01/09/2016 19:28  Ct Chest W Contrast  Result Date: 01/12/2016 CLINICAL DATA:  Weakness and lightheadedness. Fever. History of colon cancer. EXAM: CT CHEST WITH CONTRAST TECHNIQUE: Multidetector CT imaging of the chest was performed during intravenous contrast administration. CONTRAST:  75 ISOVUE-300 IOPAMIDOL (ISOVUE-300) INJECTION 61% COMPARISON:  CT abdomen pelvis - 01/10/2016 FINDINGS: Cardiovascular: Borderline cardiomegaly. No pericardial effusion. Enlarged caliber the main pulmonary artery measuring 3.7 cm in diameter. Although this examination was not tailored for the evaluation the pulmonary arteries, there are no discrete filling defects within the central pulmonary arterial tree to suggest central pulmonary embolism. Fusiform ectasia of the ascending thoracic aorta measuring 3.8 cm in diameter.  The thoracic aorta tapers to a normal caliber at the level of the aortic arch. The left vertebral artery is noted to arise directly from the aortic arch. Otherwise, conventional configuration of the aortic arch. No definite thoracic aortic dissection or periaortic stranding. Mediastinum/Nodes: Minimal mediastinal adenopathy with index precarinal lymph node measuring approximately 1 cm in greatest short axis diameter (image 47, series 2) and index sub carinal lymph node measuring approximately 1.1 cm (image 52, series 2). No definitive bulky hilar adenopathy. No axillary adenopathy. Lungs/Pleura: Evaluation of the pulmonary parenchyma is degraded secondary to patient respiratory artifact. Multiple pulmonary nodules are seen bilaterally with dominant nodule within the right upper lobe measuring approximately 1.3 x 1.0 cm (image 36, series 4) and dominant nodule within the left upper lobe measuring approximately 1.0 x 1.2 cm (image 65, series 4), findings compatible with metastatic disease. A total of 5 pulmonary nodules / metastases are seen within the left upper lobe while 6 metastases are seen within the right upper lobe. Trace right-sided pleural effusion. There is minimal subsegmental atelectasis within the right lower lobe and about the right costophrenic angle. The central airways appear patent given patient respiratory artifact. No pneumothorax. Upper Abdomen: Limited evaluation of the upper abdomen re- demonstrates innumerable hepatic  metastasis with confluent mass nearly replacing the anterior segment of the right lobe of liver measuring approximately 10.7 x 6.9 cm. Musculoskeletal: Regional soft tissues appear normal. Normal appearance of the thyroid gland. No definite acute or aggressive osseous abnormalities. Stigmata of DISH throughout the thoracic spine. IMPRESSION: 1. Trace right-sided pleural effusion and right lower lobe atelectasis. Otherwise, no definite acute cardiopulmonary disease on this  respiratory degraded examination. 2. Findings compatible with metastatic disease to the chest with multiple bilateral pulmonary nodules and associated suspected mediastinal adenopathy. 3. Re- demonstrated known hepatic metastases within the imaged upper abdomen. 4. Borderline cardiomegaly and enlarged caliber of the main pulmonary artery, nonspecific though could be seen in the setting of pulmonary arterial hypertension. Electronically Signed   By: Sandi Mariscal M.D.   On: 01/12/2016 21:00  Ct Abdomen Pelvis W Contrast  Result Date: 01/10/2016 CLINICAL DATA:  Generalized weakness. Abdominal discomfort. Remote history of colon cancer. EXAM: CT ABDOMEN AND PELVIS WITH CONTRAST TECHNIQUE: Multidetector CT imaging of the abdomen and pelvis was performed using the standard protocol following bolus administration of intravenous contrast. CONTRAST:  173mL ISOVUE-300 IOPAMIDOL (ISOVUE-300) INJECTION 61% COMPARISON:  CT 03/05/2012.  MRI 03/12/2012. FINDINGS: Lower chest: Lingular nodule measures 12 mm on the first image. Right base atelectasis. Heart is upper limits normal in size. Hepatobiliary: Extensive new low-density lesions throughout the liver concerning for metastatic disease. Conglomerate mass in the right hepatic lobe measures up to 8.6 cm. This large mass also demonstrates an area of a amorphous calcification centrally. Innumerable other smaller hepatic lesions. Gallstones noted within the gallbladder. Pancreas: No focal abnormality or ductal dilatation. Spleen: No focal abnormality.  Normal size. Adrenals/Urinary Tract: Cysts within the right kidney which appear benign. Nonobstructing stone in the mid to lower pole. No hydronephrosis. Adrenal glands and urinary bladder unremarkable. Stomach/Bowel: Prior right hemicolectomy. No evidence of bowel obstruction. Stomach and small bowel decompressed. Vascular/Lymphatic: Aorta is normal caliber. Scattered aortic calcifications. Small scattered retroperitoneal lymph  nodes, none pathologically enlarged. Enlarged porta hepatis and peripancreatic lymph nodes in the right upper quadrant. Index node on image 37 has a short axis diameter of 14 mm in appears partially necrotic. Reproductive: Small low-density areas within the left ovary, likely small cysts, the largest 2.3 cm. Uterus and right ovary unremarkable. Other: Trace interloop free fluid noted in the right abdomen. Trace free fluid in the pelvis. No free air. Musculoskeletal: No acute bony abnormality or focal bone lesion. IMPRESSION: Innumerable hepatic low-density lesions and masses compatible with metastatic disease. Cholelithiasis. Mildly enlarged right upper quadrant/porta hepatis lymph nodes, some which appear necrotic. Prior right hemicolectomy. 12 mm nodule in the lingula concerning for metastasis. Bibasilar atelectasis. Electronically Signed   By: Rolm Baptise M.D.   On: 01/10/2016 16:07   Labs:  CBC:  Recent Labs  01/10/16 0440 01/11/16 1224 01/13/16 0506 01/17/16 0830  WBC 18.7* 13.6* 11.3* 10.1  HGB 9.0* 8.7* 8.8* 9.3*  HCT 27.8* 27.2* 27.5* 30.1*  PLT 417* 428* 581* 743*    COAGS:  Recent Labs  01/17/16 0830  INR 1.17    BMP:  Recent Labs  01/09/16 1700 01/10/16 0440 01/12/16 0449 01/13/16 0506  NA 131* 132* 133* 135  K 3.4* 3.5 3.5 3.5  CL 96* 102 101 102  CO2 23 21* 26 27  GLUCOSE 119* 90 125* 150*  BUN 14 12 7 8   CALCIUM 9.9 8.9 8.9 9.5  CREATININE 0.99 0.64 0.72 0.66  GFRNONAA 55* >60 >60 >60  GFRAA >60 >60 >60 >60  LIVER FUNCTION TESTS:  Recent Labs  01/09/16 1700 01/10/16 0440 01/12/16 0449 01/13/16 0506  BILITOT 1.3* 1.1 0.8 0.8  AST 60* 46* 37 32  ALT 17 14 15 15   ALKPHOS 105 88 89 101  PROT 8.1 7.2 7.0 7.2  ALBUMIN 3.1* 2.6* 2.3* 2.4*    TUMOR MARKERS:  Recent Labs  01/13/16 1122  CEA 31.3*    Assessment and Plan: Hx colon cancer Liver lesions. For US guided liver lesion biopsy Labs ok Risks and Benefits discussed with the  patient including, but not limited to bleeding, infection, damage to adjacent structures or low yield requiring additional tests. All of the patient's questions were answered, patient is agreeable to proceed. Consent signed and in chart.    Thank you for this interesting consult.  I greatly enjoyed meeting Kyann Vollmer and look forward to participating in their care.  A copy of this report was sent to the requesting provider on this date.  Electronically Signed: Ascencion Dike 01/17/2016, 8:56 AM   I spent a total of 20 minutes in face to face in clinical consultation, greater than 50% of which was counseling/coordinating care for liver lesion biopsy

## 2016-01-20 ENCOUNTER — Telehealth: Payer: Self-pay | Admitting: *Deleted

## 2016-01-20 NOTE — Telephone Encounter (Signed)
Called patient to make her aware that she will be seen by dietician and CSW tomorrow at her appointment. She is appreciative of this, but declines physical therapy.

## 2016-01-21 ENCOUNTER — Other Ambulatory Visit: Payer: Self-pay | Admitting: General Surgery

## 2016-01-21 ENCOUNTER — Ambulatory Visit (HOSPITAL_BASED_OUTPATIENT_CLINIC_OR_DEPARTMENT_OTHER): Payer: Medicare Other | Admitting: Oncology

## 2016-01-21 ENCOUNTER — Ambulatory Visit: Payer: Medicare Other | Admitting: Nutrition

## 2016-01-21 ENCOUNTER — Encounter: Payer: Self-pay | Admitting: *Deleted

## 2016-01-21 ENCOUNTER — Telehealth: Payer: Self-pay | Admitting: *Deleted

## 2016-01-21 ENCOUNTER — Telehealth: Payer: Self-pay | Admitting: Oncology

## 2016-01-21 VITALS — BP 126/73 | HR 114 | Temp 98.7°F | Resp 17 | Ht 70.0 in | Wt 186.8 lb

## 2016-01-21 DIAGNOSIS — C787 Secondary malignant neoplasm of liver and intrahepatic bile duct: Secondary | ICD-10-CM | POA: Diagnosis not present

## 2016-01-21 DIAGNOSIS — C7800 Secondary malignant neoplasm of unspecified lung: Secondary | ICD-10-CM | POA: Diagnosis not present

## 2016-01-21 DIAGNOSIS — D649 Anemia, unspecified: Secondary | ICD-10-CM

## 2016-01-21 DIAGNOSIS — D539 Nutritional anemia, unspecified: Secondary | ICD-10-CM | POA: Diagnosis not present

## 2016-01-21 DIAGNOSIS — C182 Malignant neoplasm of ascending colon: Secondary | ICD-10-CM | POA: Diagnosis not present

## 2016-01-21 MED ORDER — HYDROCODONE-ACETAMINOPHEN 5-325 MG PO TABS: 1.0000 | ORAL_TABLET | Freq: Four times a day (QID) | ORAL | 0 refills | Status: DC | PRN

## 2016-01-21 MED ORDER — LORAZEPAM 0.5 MG PO TABS: 0.5000 mg | ORAL_TABLET | Freq: Every evening | ORAL | 0 refills | Status: DC | PRN

## 2016-01-21 MED ORDER — ONDANSETRON HCL 8 MG PO TABS: 8.0000 mg | ORAL_TABLET | Freq: Three times a day (TID) | ORAL | 1 refills | Status: DC | PRN

## 2016-01-21 NOTE — Telephone Encounter (Signed)
Gave pt cal & avs °

## 2016-01-21 NOTE — Progress Notes (Signed)
Oncology Nurse Navigator Documentation  Oncology Nurse Navigator Flowsheets 01/21/2016  Navigator Location [facility]  Navigator Encounter Type Clinic/MDC  Patient Visit Type MedOnc  Treatment Phase Pre-Tx/Tx Discussion  Barriers/Navigation Needs Education;Coordination of Care  Education Preparing for Upcoming Surgery/ Treatment;Accessing Care/ Finding Providers;Understanding Cancer/ Treatment Options;Pain/ Symptom Management;Coping with Diagnosis/ Prognosis  Interventions Coordination of Care;Education Method  Coordination of Care Other--message to [facility] for Dr. Dalbert Batman to place [facility] week of 8/7  Education Method Verbal;Written;Teach-back  Support Groups/Services GI Support Group;Other--[facility]  Acuity Level 2  Time Spent with Patient 32  Met with patient and husband during [facility] visit. Explained the role of the GI Nurse Navigator and provided printed information on [facility] and showed her a port and described how it is placed Answered questions, reviewed current treatment plan using TEACH back and provided emotional support. Provided copy of current treatment plan. Her insomnia, pain and nausea were addressed by Dr. Benay Spice. Mrs. Roca is very weak, but is able to ambulate in hall slowly. She declined to meet with physical therapist today.  Merceda Elks, RN, BSN GI Oncology [place]

## 2016-01-21 NOTE — Telephone Encounter (Signed)
Per staff message and POF I have scheduled appts. Advised scheduler of appts. JMW  

## 2016-01-21 NOTE — Progress Notes (Signed)
Patient was seen in [facility].  75 year old female diagnosed with recurrent colon cancer.  PMH includes Hypothyroidism, HTN, HLD, and DM.  Medications include synthroid and crestor.  Labs include Glucose 168 and Albumin 2.6.  Height: 5\' 10"  Weight: 186.8 lb UBW: 209 lb BMI: 26.8   Patient reports to clinic with her husband. Reports poor appetite and "forces" herself to eat. Reports phlegm in her mouth is bothersome. Reports slight constipation.  Nutrition Diagnosis:  Malnutrition related to inadequate oral intake as evidenced by 11% weight loss since January and depletion of muscle mass and fat stores on physical exam..  Intervention: Educated patient to increase calories and protein in small frequent meals and snacks 6 times daily. Reviewed high protein foods and provided a fact sheet. Encouraged patient to try oral nutrition supplements and provided samples and coupons. Questions were answered and teachback method used.  Monitoring, Evaluation, Goals:  Patient will increase oral intake to minimize further weight loss.  Next Visit: Will follow up during chemotherapy.

## 2016-01-21 NOTE — Progress Notes (Signed)
[place]  GI CLINIC Psychosocial Distress Screening Clinical Social Work  Clinical Social Work met with pt and her husband at [facility] to introduce self, explain role of CSW/Pt and Family Support Team and to review distress screening protocol.  The patient scored a 7 on the Psychosocial Distress Thermometer which indicates severe distress. Clinical Social Worker reviewed distress screening protocol to assess for distress and other psychosocial needs.   Pt reports to live with her husband and has a son and step son that are supportive. Pt denies past issues with anxiety and reports this is related to her diagnosis. CSW reviewed common emotions/reactions and coping with advanced cancer diagnosis. Pt identified prayer and her church as helpful coping techniques.  Pt considering [place] group as an option as well. CSW encouraged pt to consider counseling through CSW/counseling interns as well. Pt provided with CSW/Pt and Family Support team info sheet and resource sheet, as well as, contact info for CSW. Pt and husband agree to reach out as needed.     ONCBCN DISTRESS SCREENING 01/21/2016  Screening Type Initial Screening  Distress experienced in past week (1-10) 7  Emotional problem type Nervousness/Anxiety;Adjusting to illness  Spiritual/Religous concerns type Relating to God  Information Concerns Type Lack of info about treatment  Physical Problem type Sleep/insomnia;Getting around;Loss of appetitie;Constipation/diarrhea  Physician notified of physical symptoms Yes  Referral to support programs Yes    Clinical Social Worker follow up needed: No.  If yes, follow up plan:  Loren Racer, [place]  [facility] Phone: [phone] Fax: [phone]

## 2016-01-21 NOTE — Progress Notes (Signed)
[name] OFFICE PROGRESS NOTE   Diagnosis: Colon cancer  INTERVAL HISTORY:   I saw Judith Stephens when she was hospitalized last week with failure to thrive. A CT revealed lung and liver metastases. She underwent an ultrasound-guided biopsy of a right liver lesion on 01/17/2016. The pathology ([id number]) revealed metastatic adenocarcinoma consistent with a colonic primary. Foundation 1 testing is pending. Judith Stephens complains of insomnia and increased "phlegm". No dyspnea. She has pain in the right upper abdomen. She is not taking pain medication. She has a poor appetite.  Objective:  Vital signs in last 24 hours:  Blood pressure 126/73, pulse (!) 114, temperature 98.7 F (37.1 C), temperature source Oral, resp. rate 17, height '5\' 10"'$  (1.778 m), weight 186 lb 12.8 oz (84.7 kg), SpO2 99 %.    HEENT: No thrush Resp: Lungs clear bilaterally Cardio: Regular rate and rhythm GI: The liver is palpable in the right upper abdomen-most prominent at the medial costal margin Vascular: No leg edema   Lab Results:  Lab Results  Component Value Date   WBC 10.1 01/17/2016   HGB 9.3 (L) 01/17/2016   HCT 30.1 (L) 01/17/2016   MCV 82.2 01/17/2016   PLT 743 (H) 01/17/2016   NEUTROABS 6.3 01/17/2016      Imaging:  US Biopsy  Result Date: 01/17/2016 INDICATION: History of colon cancer, now with multiple infiltrative liver lesions / masses worrisome for metastatic disease. Please perform ultrasound-guided liver biopsy for tissue diagnostic purposes. EXAM: ULTRASOUND GUIDED LIVER LESION BIOPSY COMPARISON:  CT of the abdomen and pelvis - 01/10/2016 MEDICATIONS: None ANESTHESIA/SEDATION: Fentanyl 50 mcg IV; Versed 2.5 mg IV Total Moderate Sedation time: 11 minutes. The patient's level of consciousness and vital signs were monitored continuously by radiology nursing throughout the procedure under my direct supervision. COMPLICATIONS: None immediate. PROCEDURE: Informed written consent  was obtained from the patient after a discussion of the risks, benefits and alternatives to treatment. The patient understands and consents the procedure. A timeout was performed prior to the initiation of the procedure. Ultrasound scanning was performed of the right upper abdominal quadrant demonstrates multiple ill-defined mixed echogenic lesions/masses scattered throughout the right lobe of the liver. Dominant mixed echogenic component within the caudal aspect of the right lobe of the liver was targeted for biopsy secondary to sonographic visualization. The procedure was planned. The right upper abdominal quadrant was prepped and draped in the usual sterile fashion. The overlying soft tissues were anesthetized with 1% lidocaine with epinephrine. A 17 gauge, 6.8 cm co-axial needle was advanced into a peripheral aspect of the lesion. This was followed by 4 core biopsies with an 18 gauge core device under direct ultrasound guidance. The coaxial needle tract was embolized with a small amount of Gel-Foam slurry and superficial hemostasis was obtained with manual compression. Post procedural scanning was negative for definitive area of hemorrhage or additional complication. A dressing was placed. The patient tolerated the procedure well without immediate post procedural complication. IMPRESSION: Technically successful ultrasound guided core needle biopsy of dominant infiltrative mass within the caudal aspect the right lobe of the liver. Electronically Signed   By: Sandi Mariscal M.D.   On: 01/17/2016 11:50   Medications: I have reviewed the patient's current medications.  Assessment/Plan: 1. 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 She began adjuvant Xeloda on 05/14/2012. Final cycle of Xeloda started on 10/22/2012.  CT abdomen/pelvis 01/10/2016-innumerable hepatic metastases  CT chest 01/12/2016-multiple pulmonary nodules  consistent with metastases 2. Severe microcytic anemia likely secondary to iron deficiency related to bleeding from the colon tumor. The hemoglobin and MCV improved on oral iron. Oral iron was discontinued following office visit 12/16/2012.  Recurrent microcytic anemia July 2017 3. Diabetes. 4. Hypertension. 5. Hyperlipidemia. 6. History of hand/foot syndrome secondary to Xeloda prompting the Xeloda to be discontinued after day 12, cycle 2. Improved. Xeloda was dose reduced beginning with cycle 3. 7. History of mild diarrhea secondary to Xeloda.  8. History of eye tearing-most likely related to seasonal allergies though this could be a manifestation of Xeloda toxicity.  9. Status post root canal 10/02/2012. 10. Colonoscopy 03/13/2013 by Dr. Amedeo Plenty. Pathology on transverse colon polyp showed a tubular adenoma. Next colonoscopy recommended that a three-year interval. 11. Anorexia/weight loss  Disposition:  Judith Stephens has been diagnosed with metastatic colon cancer. She most likely has missed a disease related to the 2013 ascending colon tumor. She has recurrent microcytic anemia. We will check a ferritin level when she returns for labs next week. We will also check stool Hemoccults.  I discussed the diagnosis and treatment options with Judith Stephens and her husband. No therapy will be curative. She is symptomatic with right abdomen discomfort and anorexia. I recommend FOLFOX/Avastin.  We reviewed the potential toxicities associated with the FOLFOX regimen including the chance for nausea/vomiting, mucositis, diarrhea, alopecia, and hematologic toxicity. We discussed the sun sensitivity, hyperpigmentation, and hand/foot syndrome associated with 5 fluorouracil. We reviewed the allergic reaction and various types of neuropathy seen with oxaliplatin. We discussed the hypertension, bowel perforation, thromboembolic disease, nephropathy, and encephalopathy associated with Avastin.  Judith Stephens agrees to  proceed with FOLFOX/Avastin. She would like to have chemotherapy on Tuesdays. She is scheduled for a first cycle on 02/01/2016.  I will refer her to Dr. Dalbert Batman for placement of a Port-A-Cath.  She met with the [facility] nutritionist today. We gave her prescription for hydrocodone to use as needed for pain and Zofran for nausea. She will use Ativan as needed for insomnia. Approximately 40 minutes were spent with the patient today. The majority of the time was used for counseling and coordination of care.  Betsy Coder, MD  01/21/2016  9:31 AM

## 2016-01-21 NOTE — Patient Instructions (Signed)
Your cancer has recurrence to the liver, which is making you feel fatigued, nauseated and have pain. Dr. Benay Spice suggests starting FOLFOX/Avastin regimen every 2 weeks to obtain a cancer partial remission-it is not curable at this time. We will have Dr. Dalbert Batman place a port a cath next week and start treatment next week on 02/01/16. You will attend a chemo teaching class next week and have your baseline lab work done that day. For pain: Vicodin 1 tablet every 6 hours as needed (script printed) For Nausea: Zofan 8 mg every 8 hours as needed For Sleep: Ativan 0.5 mg at bedtime if needed For Bowels: Miralax 17 grams in 8 ounces of fluid daily

## 2016-01-25 ENCOUNTER — Other Ambulatory Visit (HOSPITAL_BASED_OUTPATIENT_CLINIC_OR_DEPARTMENT_OTHER): Payer: Medicare Other

## 2016-01-25 ENCOUNTER — Other Ambulatory Visit: Payer: Medicare Other

## 2016-01-25 ENCOUNTER — Other Ambulatory Visit: Payer: Self-pay | Admitting: *Deleted

## 2016-01-25 ENCOUNTER — Encounter: Payer: Self-pay | Admitting: *Deleted

## 2016-01-25 DIAGNOSIS — Z85038 Personal history of other malignant neoplasm of large intestine: Secondary | ICD-10-CM | POA: Diagnosis present

## 2016-01-25 DIAGNOSIS — C182 Malignant neoplasm of ascending colon: Secondary | ICD-10-CM

## 2016-01-25 DIAGNOSIS — D649 Anemia, unspecified: Secondary | ICD-10-CM

## 2016-01-25 DIAGNOSIS — D509 Iron deficiency anemia, unspecified: Secondary | ICD-10-CM

## 2016-01-25 DIAGNOSIS — C189 Malignant neoplasm of colon, unspecified: Secondary | ICD-10-CM | POA: Diagnosis not present

## 2016-01-25 LAB — FERRITIN: Ferritin: 2192 ng/ml — ABNORMAL HIGH (ref 9–269)

## 2016-01-25 LAB — CBC WITH DIFFERENTIAL/PLATELET
BASO%: 0.1 % (ref 0.0–2.0)
BASOS ABS: 0 10*3/uL (ref 0.0–0.1)
EOS%: 0 % (ref 0.0–7.0)
Eosinophils Absolute: 0 10*3/uL (ref 0.0–0.5)
HEMATOCRIT: 27.8 % — AB (ref 34.8–46.6)
HGB: 8.8 g/dL — ABNORMAL LOW (ref 11.6–15.9)
LYMPH#: 2.8 10*3/uL (ref 0.9–3.3)
LYMPH%: 11.8 % — AB (ref 14.0–49.7)
MCH: 25.4 pg (ref 25.1–34.0)
MCHC: 31.7 g/dL (ref 31.5–36.0)
MCV: 80.1 fL (ref 79.5–101.0)
MONO#: 2.6 10*3/uL — ABNORMAL HIGH (ref 0.1–0.9)
MONO%: 11 % (ref 0.0–14.0)
NEUT#: 18.4 10*3/uL — ABNORMAL HIGH (ref 1.5–6.5)
NEUT%: 77.1 % — AB (ref 38.4–76.8)
PLATELETS: 586 10*3/uL — AB (ref 145–400)
RBC: 3.47 10*6/uL — AB (ref 3.70–5.45)
RDW: 16 % — ABNORMAL HIGH (ref 11.2–14.5)
WBC: 23.9 10*3/uL — ABNORMAL HIGH (ref 3.9–10.3)

## 2016-01-25 LAB — COMPREHENSIVE METABOLIC PANEL
ALK PHOS: 129 U/L (ref 40–150)
ALT: 13 U/L (ref 0–55)
ANION GAP: 12 meq/L — AB (ref 3–11)
AST: 49 U/L — AB (ref 5–34)
Albumin: 2.2 g/dL — ABNORMAL LOW (ref 3.5–5.0)
BILIRUBIN TOTAL: 0.77 mg/dL (ref 0.20–1.20)
BUN: 9.2 mg/dL (ref 7.0–26.0)
CALCIUM: 10.3 mg/dL (ref 8.4–10.4)
CO2: 22 mEq/L (ref 22–29)
CREATININE: 1 mg/dL (ref 0.6–1.1)
Chloride: 98 mEq/L (ref 98–109)
EGFR: 68 mL/min/{1.73_m2} — ABNORMAL LOW (ref >90)
GLUCOSE: 321 mg/dL — AB (ref 70–140)
POTASSIUM: 4.2 meq/L (ref 3.5–5.1)
Sodium: 132 mEq/L — ABNORMAL LOW (ref 136–145)
Total Protein: 7.8 g/dL (ref 6.4–8.3)

## 2016-01-25 LAB — IRON AND TIBC
Iron: <11 ug/dL — ABNORMAL LOW (ref 41–142)
TIBC: 200 ug/dL — AB (ref 236–444)

## 2016-01-25 LAB — CEA (IN HOUSE-CHCC): CEA (CHCC-IN HOUSE): 108.59 ng/mL — AB (ref 0.00–5.00)

## 2016-01-25 MED ORDER — LIDOCAINE-PRILOCAINE 2.5-2.5 % EX CREA: 1.0000 "application " | TOPICAL_CREAM | CUTANEOUS | 0 refills | Status: DC | PRN

## 2016-01-26 LAB — CEA: CEA1: 87.6 ng/mL — AB (ref 0.0–4.7)

## 2016-01-27 ENCOUNTER — Telehealth: Payer: Self-pay | Admitting: *Deleted

## 2016-01-27 NOTE — Telephone Encounter (Signed)
  Oncology Nurse Navigator Documentation  Navigator Location: [facility] (01/27/16 1550) Navigator Encounter Type: Telephone (01/27/16 1550) Telephone: Incoming Call;Symptom Mgt (01/27/16 1550)   Judith Stephens called to report "I feel sore all over. Is this normal?". With questioning, her soreness is more in her abdomen, but has only taken her Vicodin once/day. She also has nausea, but is taking the Zofran only once/day. She denies any fever or swelling in her abdomen or legs. She says she is not short of breath.               Interventions: Other (01/27/16 1550)   Encouraged her to take her pain med more often-can take every 6 hours as needed and also take Zofran every 8 hours if needed. Call for any swelling or fever or dyspnea. Informed her that her cancer involvement of her liver can cause nausea and the pain. Will relay information to MD and she will be called if he suggests any other intervention.                    Time Spent with Patient: 15 (01/27/16 1550)

## 2016-01-28 ENCOUNTER — Encounter (HOSPITAL_COMMUNITY)
Admission: RE | Admit: 2016-01-28 | Discharge: 2016-01-28 | Disposition: A | Discharge status: Home / Self Care | Payer: Medicare Other | Source: Ambulatory Visit | Attending: General Surgery | Admitting: General Surgery

## 2016-01-28 ENCOUNTER — Encounter (HOSPITAL_COMMUNITY): Payer: Self-pay

## 2016-01-28 DIAGNOSIS — M1990 Unspecified osteoarthritis, unspecified site: Secondary | ICD-10-CM | POA: Diagnosis not present

## 2016-01-28 DIAGNOSIS — C182 Malignant neoplasm of ascending colon: Secondary | ICD-10-CM | POA: Diagnosis not present

## 2016-01-28 DIAGNOSIS — C183 Malignant neoplasm of hepatic flexure: Secondary | ICD-10-CM | POA: Diagnosis not present

## 2016-01-28 DIAGNOSIS — C787 Secondary malignant neoplasm of liver and intrahepatic bile duct: Secondary | ICD-10-CM | POA: Diagnosis not present

## 2016-01-28 DIAGNOSIS — Z7982 Long term (current) use of aspirin: Secondary | ICD-10-CM | POA: Diagnosis not present

## 2016-01-28 DIAGNOSIS — Z7984 Long term (current) use of oral hypoglycemic drugs: Secondary | ICD-10-CM | POA: Diagnosis not present

## 2016-01-28 DIAGNOSIS — I10 Essential (primary) hypertension: Secondary | ICD-10-CM | POA: Diagnosis not present

## 2016-01-28 DIAGNOSIS — D509 Iron deficiency anemia, unspecified: Secondary | ICD-10-CM | POA: Diagnosis not present

## 2016-01-28 DIAGNOSIS — C7800 Secondary malignant neoplasm of unspecified lung: Secondary | ICD-10-CM | POA: Diagnosis not present

## 2016-01-28 DIAGNOSIS — E785 Hyperlipidemia, unspecified: Secondary | ICD-10-CM | POA: Diagnosis not present

## 2016-01-28 DIAGNOSIS — Z87891 Personal history of nicotine dependence: Secondary | ICD-10-CM | POA: Diagnosis not present

## 2016-01-28 DIAGNOSIS — E119 Type 2 diabetes mellitus without complications: Secondary | ICD-10-CM | POA: Diagnosis not present

## 2016-01-28 DIAGNOSIS — E039 Hypothyroidism, unspecified: Secondary | ICD-10-CM | POA: Diagnosis not present

## 2016-01-28 DIAGNOSIS — Z79899 Other long term (current) drug therapy: Secondary | ICD-10-CM | POA: Diagnosis not present

## 2016-01-28 HISTORY — DX: Anxiety disorder, unspecified: F41.9

## 2016-01-28 HISTORY — DX: Malignant (primary) neoplasm, unspecified: C80.1

## 2016-01-28 LAB — COMPREHENSIVE METABOLIC PANEL
ALT: 16 U/L (ref 14–54)
AST: 42 U/L — AB (ref 15–41)
Albumin: 2 g/dL — ABNORMAL LOW (ref 3.5–5.0)
Alkaline Phosphatase: 94 U/L (ref 38–126)
Anion gap: 11 (ref 5–15)
BUN: 11 mg/dL (ref 6–20)
CHLORIDE: 95 mmol/L — AB (ref 101–111)
CO2: 24 mmol/L (ref 22–32)
CREATININE: 0.78 mg/dL (ref 0.44–1.00)
Calcium: 10.2 mg/dL (ref 8.9–10.3)
Glucose, Bld: 248 mg/dL — ABNORMAL HIGH (ref 65–99)
POTASSIUM: 4 mmol/L (ref 3.5–5.1)
SODIUM: 130 mmol/L — AB (ref 135–145)
TOTAL PROTEIN: 7.4 g/dL (ref 6.5–8.1)
Total Bilirubin: 0.8 mg/dL (ref 0.3–1.2)

## 2016-01-28 LAB — GLUCOSE, CAPILLARY: Glucose-Capillary: 236 mg/dL — ABNORMAL HIGH (ref 65–99)

## 2016-01-28 LAB — CBC WITH DIFFERENTIAL/PLATELET
BASOS ABS: 0 10*3/uL (ref 0.0–0.1)
Basophils Relative: 0 %
EOS ABS: 0.1 10*3/uL (ref 0.0–0.7)
EOS PCT: 0 %
HCT: 27.3 % — ABNORMAL LOW (ref 36.0–46.0)
Hemoglobin: 8.4 g/dL — ABNORMAL LOW (ref 12.0–15.0)
LYMPHS PCT: 13 %
Lymphs Abs: 2 10*3/uL (ref 0.7–4.0)
MCH: 25.1 pg — ABNORMAL LOW (ref 26.0–34.0)
MCHC: 30.8 g/dL (ref 30.0–36.0)
MCV: 81.7 fL (ref 78.0–100.0)
Monocytes Absolute: 1.9 10*3/uL — ABNORMAL HIGH (ref 0.1–1.0)
Monocytes Relative: 13 %
Neutro Abs: 10.9 10*3/uL — ABNORMAL HIGH (ref 1.7–7.7)
Neutrophils Relative %: 74 %
PLATELETS: 613 10*3/uL — AB (ref 150–400)
RBC: 3.34 MIL/uL — AB (ref 3.87–5.11)
RDW: 16.3 % — AB (ref 11.5–15.5)
WBC: 14.9 10*3/uL — AB (ref 4.0–10.5)

## 2016-01-28 LAB — PROTIME-INR
INR: 1.23
PROTHROMBIN TIME: 15.6 s — AB (ref 11.4–15.2)

## 2016-01-28 LAB — APTT: APTT: 43 s — AB (ref 24–36)

## 2016-01-28 NOTE — Progress Notes (Signed)
PCP: Dr. Donnie Coffin @ [facility] [phone]. Oncologist: Dr. Benay Spice  Pt. Reported she was on glipizide 10 mg daily and blood sugars fasting 92-93. After a recent hospital admissions she was put on metforman 500mg  daily. She states sugars have been running in the 200's and is concerned. I called Dr. Virgilio Belling office and left a message for his nurse to contact pt. And discuss her concerns.

## 2016-01-28 NOTE — Pre-Procedure Instructions (Signed)
Judith Stephens  01/28/2016      [place], [place] [facility] [address] [place] [address] Phone: [phone] Fax: [phone]  [id number] [place], [place] [id number] [place] [id number] Phone: [phone] Fax: [place], [place] [address] [place] [address] Phone: [phone] Fax: [phone]    Your procedure is scheduled on Aug. 14  Report to [facility] at 7:30 A.M.  Call this number if you have problems the morning of surgery:  [phone]   Remember:  Do not eat food or drink liquids after midnight on Aug.13  Take these medicines the morning of surgery with A SIP OF WATER: amlodipine (norvasc), flonase nasal spray, hydrocodone if needed, levothyroxine, lorazepam(ativan),zofran if needed              Stop aspirin and do not take NSAIDS: advil, motrin, ibuprofen, aleve BC powders, goody's     How to Manage Your Diabetes Before and After Surgery  Why is it important to control my blood sugar before and after surgery? . Improving blood sugar levels before and after surgery helps healing and can limit problems. . A way of improving blood sugar control is eating a healthy diet by: o  Eating less sugar and carbohydrates o  Increasing activity/exercise o  Talking with your doctor about reaching your blood sugar goals . High blood sugars (greater than 180 mg/dL) can raise your risk of infections and slow your recovery, so you will need to focus on controlling your diabetes during the weeks before surgery. . Make sure that the doctor who takes care of your diabetes knows about your planned surgery including the date and location.  How do I manage my blood sugar before surgery? . Check your blood sugar at least 4 times a day, starting 2 days before surgery, to make sure  that the level is not too high or low. o Check your blood sugar the morning of your surgery when you wake up and every 2 hours until you get to the Short Stay unit. . If your blood sugar is less than 70 mg/dL, you will need to treat for low blood sugar: o Do not take insulin. o Treat a low blood sugar (less than 70 mg/dL) with  cup of clear juice (cranberry or apple), 4 glucose tablets, OR glucose gel. o Recheck blood sugar in 15 minutes after treatment (to make sure it is greater than 70 mg/dL). If your blood sugar is not greater than 70 mg/dL on recheck, call [phone] for further instructions. . Report your blood sugar to the short stay nurse when you get to Short Stay.  . If you are admitted to the hospital after surgery: o Your blood sugar will be checked by the staff and you will probably be given insulin after surgery (instead of oral diabetes medicines) to make sure you have good blood sugar levels. o The goal for blood sugar control after surgery is 80-180 mg/dL.              WHAT DO I DO ABOUT MY DIABETES MEDICATION?   Marland Kitchen Do not take oral diabetes medicines (pills) the morning of surgery.        Do not wear jewelry, make-up or nail polish.  Do not wear lotions, powders, or perfumes.  You may not wear deoderant.  Do not shave 48 hours prior to surgery.  Men may shave face and neck.  Do not bring valuables to the hospital.  [facility] is not responsible for any belongings or valuables.  Contacts, dentures or bridgework may not be worn into surgery.  Leave your suitcase in the car.  After surgery it may be brought to your room.  For patients admitted to the hospital, discharge time will be determined by your treatment team.  Patients discharged the day of surgery will not be allowed to drive home.   Name and phone number of your driver:    Special instructions:  Review preparing for surgery   Please read over the following fact sheets that you were  given. Coughing and Deep Breathing

## 2016-01-29 LAB — HEMOGLOBIN A1C
HEMOGLOBIN A1C: 7.6 % — AB (ref 4.8–5.6)
Mean Plasma Glucose: 171 mg/dL

## 2016-01-30 ENCOUNTER — Encounter (HOSPITAL_COMMUNITY): Payer: Self-pay | Admitting: Certified Registered Nurse Anesthetist

## 2016-01-30 ENCOUNTER — Other Ambulatory Visit: Payer: Self-pay | Admitting: Oncology

## 2016-01-30 MED ORDER — CEFAZOLIN SODIUM-DEXTROSE 2-4 GM/100ML-% IV SOLN
2.0000 g | INTRAVENOUS | Status: AC
  Administered 2016-01-31: 2 g via INTRAVENOUS
  Filled 2016-01-30: qty 100

## 2016-01-30 NOTE — H&P (Addendum)
HPI Judith Stephens is a 75 y.o. female. She is brought to the OR for port a cath placement.   This patient originally underwent laparoscopic lysis of adhesions, extensive, right colectomy, and primary repair of ventral hernia on 03/14/2012. Final pathology showed a T4a., N0 tumor. 0/13 nodes positive. Preop CEA 1.0.  She is being followed closely by Dr. Julieanne Manson and had been on Xeloda chemotherapy, That has was discontinued due to side effects. .        She had a colonoscopy by Dr. Teena Irani on 03/13/2013. She had one benign polyp. Followup in 3 years was planned. She saw Dr. Benay Spice 04/22/2013. CEA was 0.9. She is felt to be cancer free. Six-month followup was planned      She was recently hospitalized 2 weeks ago for failure to thrive.  Work up revealed multiple liver and lung metastasis, and liver biopsy was positive for metastatic adenocarcinoma. She has anorexia, RUQ pain, insomnia, anemia, protein calorie malnutrition. Dr. Benay Spice advised FOLFOX/avastin to palliate her symptoms of pain and anorexia.  She agrees to try this and agrees to port a cath insertion.     She has met with the chemo nurses and has gone over all of this. I have discussed the indications, techniques and risks of port insertion, and she agrees to proceed.        Past Medical History  Diagnosis Date  . Hypertension   . Diabetes mellitus   . Hyperlipidemia   . Blood transfusion   . Arthritis   . Hypothyroidism   . Anemia     hx of blood transfusion 03/06/12     Prior to Admission medications   Medication Sig Start Date End Date Taking? Authorizing Provider  amLODipine (NORVASC) 10 MG tablet Take 10 mg by mouth daily.    Yes Historical Provider, MD  aspirin EC 81 MG tablet Take 81 mg by mouth daily.    Yes Historical Provider, MD  enalapril-hydrochlorothiazide (VASERETIC) 10-25 MG per tablet Take 1 tablet by mouth daily.    Yes Historical Provider, MD  fluticasone (FLONASE) 50 MCG/ACT  nasal spray Place 2 sprays into the nose daily as needed for rhinitis. For allergies   Yes Historical Provider, MD  GLIPIZIDE XL 10 MG 24 hr tablet Take 10 mg by mouth daily with breakfast.    Yes Historical Provider, MD  levothyroxine (SYNTHROID, LEVOTHROID) 150 MCG tablet Take 150 mcg by mouth daily before breakfast.   Yes Historical Provider, MD  loratadine (CLARITIN) 10 MG tablet Take 10 mg by mouth daily as needed for allergies.    Yes Historical Provider, MD  rosuvastatin (CRESTOR) 10 MG tablet Take 10 mg by mouth every Monday, Wednesday, and Friday.    Yes Historical Provider, MD          Past Surgical History  Procedure Laterality Date  . Oophorectomy    . Tubal ligation    . Laparoscopy  03/14/2012    Procedure: LAPAROSCOPY DIAGNOSTIC;  Surgeon: Adin Hector, MD;  Location: [facility] ORS;  Service: General;;  right colectomy          Family History  Problem Relation Age of Onset  . Hypertension Father   . Hypertension Sister     x3  . Hypertension Brother     Social History       History  Substance Use Topics  . Smoking status: Former Research scientist (life sciences)  . Smokeless tobacco: Never Used  . Alcohol Use: Yes  Comment: occasional wine     No Known Allergies    Review of Systems Review of Systems   weight loss, RUQ pain, anemia, anorexia. Otherwise 12 system ROS is non contributory    kg).  Physical Exam Physical Exam  Constitutional: She is oriented to person, place, and time.   HENT:  Head: Normocephalic and atraumatic.  Nose: Nose normal.  Mouth/Throat: No oropharyngeal exudate.  Eyes: Conjunctivae and EOM are normal. Pupils are equal, round, and reactive to light. Left eye exhibits no discharge. No scleral icterus.  Neck: Neck supple. No JVD present. No tracheal deviation present. No thyromegaly present.  Cardiovascular: Normal rate, regular rhythm, normal heart sounds and intact distal pulses.   No murmur  heard. Pulmonary/Chest: Effort normal and breath sounds normal. No respiratory distress. She has no wheezes. She has no rales. She exhibits no tenderness.  Abdominal: Soft.RUQ liver edge palpable with mild tenderness. Midline incision well healed. No hernia. No mass. No tenderness of incision. Spleen non-palpable Musculoskeletal: She exhibits no edema and no tenderness.  Lymphadenopathy:    She has no cervical, inguinal adenopathy.  Neurological: She is alert and oriented to person, place, and time. She exhibits normal muscle tone. Coordination normal.  Skin: Skin is warm. No rash noted. She is not diaphoretic. No erythema. No pallor.  Psychiatric: She has a normal mood and affect. Her behavior is normal. Judgment and thought content normal.    Data Reviewed Cancer center records.   Hospital records.   Imaging. Pathology. Discussed with Dr. Benay Spice.  Lab work. Colonoscopy report. My records.  Assessment    Stage IV cancer right colon.  Adenocarcinoma of the hepatic flexure, final original pathologic stage T4a., N0(IIB). .(05/14/2012)  Benign colonoscopy 03/13/2013/  History hand/foot syndrome, mild diarrhea, mild eye tearing, possibly due to Xeloda.  Severe microcytic anemia  Hypertension  Non-insulin-dependent diabetes mellitus  Hyperlipidemia  Hypothyroidism    Plan    Insert port a cath in OR.  Palliative FOLFOX/avastin to follow       Edsel Petrin. Dalbert Batman, M.D., [facility] General and Minimally invasive Surgery Breast and Colorectal Surgery Office:   [phone]

## 2016-01-31 ENCOUNTER — Encounter (HOSPITAL_COMMUNITY): Payer: Self-pay | Admitting: General Practice

## 2016-01-31 ENCOUNTER — Ambulatory Visit (HOSPITAL_COMMUNITY): Payer: Medicare Other | Admitting: Emergency Medicine

## 2016-01-31 ENCOUNTER — Ambulatory Visit (HOSPITAL_COMMUNITY): Payer: Medicare Other

## 2016-01-31 ENCOUNTER — Encounter (HOSPITAL_COMMUNITY)
Admission: RE | Disposition: A | Discharge status: Home / Self Care | Payer: Self-pay | Source: Ambulatory Visit | Attending: General Surgery

## 2016-01-31 ENCOUNTER — Ambulatory Visit (HOSPITAL_COMMUNITY)
Admission: RE | Admit: 2016-01-31 | Discharge: 2016-01-31 | Disposition: A | Discharge status: Home / Self Care | Payer: Medicare Other | Source: Ambulatory Visit | Attending: General Surgery | Admitting: General Surgery

## 2016-01-31 ENCOUNTER — Ambulatory Visit (HOSPITAL_COMMUNITY): Payer: Medicare Other | Admitting: Certified Registered Nurse Anesthetist

## 2016-01-31 ENCOUNTER — Telehealth: Payer: Self-pay | Admitting: *Deleted

## 2016-01-31 DIAGNOSIS — Z87891 Personal history of nicotine dependence: Secondary | ICD-10-CM | POA: Diagnosis not present

## 2016-01-31 DIAGNOSIS — C182 Malignant neoplasm of ascending colon: Secondary | ICD-10-CM | POA: Insufficient documentation

## 2016-01-31 DIAGNOSIS — D509 Iron deficiency anemia, unspecified: Secondary | ICD-10-CM | POA: Insufficient documentation

## 2016-01-31 DIAGNOSIS — Z79899 Other long term (current) drug therapy: Secondary | ICD-10-CM | POA: Diagnosis not present

## 2016-01-31 DIAGNOSIS — Z419 Encounter for procedure for purposes other than remedying health state, unspecified: Secondary | ICD-10-CM

## 2016-01-31 DIAGNOSIS — C189 Malignant neoplasm of colon, unspecified: Secondary | ICD-10-CM | POA: Diagnosis not present

## 2016-01-31 DIAGNOSIS — M1990 Unspecified osteoarthritis, unspecified site: Secondary | ICD-10-CM | POA: Insufficient documentation

## 2016-01-31 DIAGNOSIS — D649 Anemia, unspecified: Secondary | ICD-10-CM | POA: Diagnosis not present

## 2016-01-31 DIAGNOSIS — C183 Malignant neoplasm of hepatic flexure: Secondary | ICD-10-CM | POA: Insufficient documentation

## 2016-01-31 DIAGNOSIS — I10 Essential (primary) hypertension: Secondary | ICD-10-CM | POA: Insufficient documentation

## 2016-01-31 DIAGNOSIS — E785 Hyperlipidemia, unspecified: Secondary | ICD-10-CM | POA: Insufficient documentation

## 2016-01-31 DIAGNOSIS — C7800 Secondary malignant neoplasm of unspecified lung: Secondary | ICD-10-CM | POA: Insufficient documentation

## 2016-01-31 DIAGNOSIS — E119 Type 2 diabetes mellitus without complications: Secondary | ICD-10-CM | POA: Insufficient documentation

## 2016-01-31 DIAGNOSIS — Z7984 Long term (current) use of oral hypoglycemic drugs: Secondary | ICD-10-CM | POA: Diagnosis not present

## 2016-01-31 DIAGNOSIS — E039 Hypothyroidism, unspecified: Secondary | ICD-10-CM | POA: Insufficient documentation

## 2016-01-31 DIAGNOSIS — Z95828 Presence of other vascular implants and grafts: Secondary | ICD-10-CM

## 2016-01-31 DIAGNOSIS — Z452 Encounter for adjustment and management of vascular access device: Secondary | ICD-10-CM | POA: Diagnosis not present

## 2016-01-31 DIAGNOSIS — C787 Secondary malignant neoplasm of liver and intrahepatic bile duct: Secondary | ICD-10-CM

## 2016-01-31 DIAGNOSIS — Z7982 Long term (current) use of aspirin: Secondary | ICD-10-CM | POA: Insufficient documentation

## 2016-01-31 HISTORY — PX: PORTACATH PLACEMENT: SHX2246

## 2016-01-31 LAB — GLUCOSE, CAPILLARY
GLUCOSE-CAPILLARY: 95 mg/dL (ref 65–99)
Glucose-Capillary: 88 mg/dL (ref 65–99)

## 2016-01-31 SURGERY — INSERTION, TUNNELED CENTRAL VENOUS DEVICE, WITH PORT
Anesthesia: General | Site: Chest

## 2016-01-31 MED ORDER — OXYCODONE HCL 5 MG PO TABS: 5.0000 mg | ORAL_TABLET | Freq: Once | ORAL | Status: DC | PRN

## 2016-01-31 MED ORDER — CHLORHEXIDINE GLUCONATE CLOTH 2 % EX PADS: 6.0000 | MEDICATED_PAD | Freq: Once | CUTANEOUS | Status: DC

## 2016-01-31 MED ORDER — SODIUM CHLORIDE 0.9 % IV SOLN
INTRAVENOUS | Status: DC | PRN
  Administered 2016-01-31: 10:00:00

## 2016-01-31 MED ORDER — MIDAZOLAM HCL 2 MG/2ML IJ SOLN
INTRAMUSCULAR | Status: AC
  Filled 2016-01-31: qty 2

## 2016-01-31 MED ORDER — HEPARIN SOD (PORK) LOCK FLUSH 100 UNIT/ML IV SOLN
INTRAVENOUS | Status: AC
  Filled 2016-01-31: qty 5

## 2016-01-31 MED ORDER — HYDROCODONE-ACETAMINOPHEN 5-325 MG PO TABS: 1.0000 | ORAL_TABLET | ORAL | 0 refills | Status: DC | PRN

## 2016-01-31 MED ORDER — LIDOCAINE-EPINEPHRINE (PF) 1 %-1:200000 IJ SOLN
INTRAMUSCULAR | Status: DC | PRN
  Administered 2016-01-31: 7 mL

## 2016-01-31 MED ORDER — IOPAMIDOL (ISOVUE-300) INJECTION 61%
INTRAVENOUS | Status: DC | PRN
  Administered 2016-01-31: 50 mL via INTRAVENOUS

## 2016-01-31 MED ORDER — OXYCODONE HCL 5 MG/5ML PO SOLN: 5.0000 mg | Freq: Once | ORAL | Status: DC | PRN

## 2016-01-31 MED ORDER — FENTANYL CITRATE (PF) 100 MCG/2ML IJ SOLN
INTRAMUSCULAR | Status: DC | PRN
  Administered 2016-01-31 (×3): 50 ug via INTRAVENOUS

## 2016-01-31 MED ORDER — HEPARIN SOD (PORK) LOCK FLUSH 100 UNIT/ML IV SOLN
INTRAVENOUS | Status: DC | PRN
  Administered 2016-01-31: 250 [IU] via INTRAVENOUS

## 2016-01-31 MED ORDER — MEPERIDINE HCL 25 MG/ML IJ SOLN: 6.2500 mg | INTRAMUSCULAR | Status: DC | PRN

## 2016-01-31 MED ORDER — LIDOCAINE-EPINEPHRINE (PF) 1 %-1:200000 IJ SOLN
INTRAMUSCULAR | Status: AC
  Filled 2016-01-31: qty 30

## 2016-01-31 MED ORDER — FENTANYL CITRATE (PF) 100 MCG/2ML IJ SOLN: 25.0000 ug | INTRAMUSCULAR | Status: DC | PRN

## 2016-01-31 MED ORDER — ONDANSETRON HCL 4 MG/2ML IJ SOLN
INTRAMUSCULAR | Status: DC | PRN
  Administered 2016-01-31: 4 mg via INTRAVENOUS

## 2016-01-31 MED ORDER — PROPOFOL 10 MG/ML IV BOLUS
INTRAVENOUS | Status: AC
  Filled 2016-01-31: qty 20

## 2016-01-31 MED ORDER — 0.9 % SODIUM CHLORIDE (POUR BTL) OPTIME
TOPICAL | Status: DC | PRN
  Administered 2016-01-31: 1000 mL

## 2016-01-31 MED ORDER — METOCLOPRAMIDE HCL 5 MG/ML IJ SOLN: 10.0000 mg | Freq: Once | INTRAMUSCULAR | Status: DC | PRN

## 2016-01-31 MED ORDER — IOPAMIDOL (ISOVUE-300) INJECTION 61%
INTRAVENOUS | Status: AC
  Filled 2016-01-31: qty 50

## 2016-01-31 MED ORDER — FENTANYL CITRATE (PF) 250 MCG/5ML IJ SOLN
INTRAMUSCULAR | Status: AC
  Filled 2016-01-31: qty 5

## 2016-01-31 MED ORDER — LACTATED RINGERS IV SOLN
INTRAVENOUS | Status: DC
  Administered 2016-01-31 (×2): via INTRAVENOUS

## 2016-01-31 MED ORDER — PROPOFOL 1000 MG/100ML IV EMUL
INTRAVENOUS | Status: AC
  Filled 2016-01-31: qty 100

## 2016-01-31 MED ORDER — LIDOCAINE HCL (CARDIAC) 20 MG/ML IV SOLN
INTRAVENOUS | Status: DC | PRN
  Administered 2016-01-31: 80 mg via INTRAVENOUS

## 2016-01-31 MED ORDER — PROPOFOL 10 MG/ML IV BOLUS
INTRAVENOUS | Status: DC | PRN
  Administered 2016-01-31: 120 mg via INTRAVENOUS

## 2016-01-31 MED ORDER — OXYCODONE HCL 5 MG PO TABS: 5.0000 mg | ORAL_TABLET | ORAL | Status: DC | PRN

## 2016-01-31 SURGICAL SUPPLY — 52 items
BAG DECANTER FOR FLEXI CONT (MISCELLANEOUS) ×3 IMPLANT
BLADE SURG 11 STRL SS (BLADE) ×3 IMPLANT
BLADE SURG 15 STRL LF DISP TIS (BLADE) ×1 IMPLANT
BLADE SURG 15 STRL SS (BLADE) ×2
BLADE SURG ROTATE 9660 (MISCELLANEOUS) IMPLANT
CANISTER SUCTION 2500CC (MISCELLANEOUS) IMPLANT
CHLORAPREP W/TINT 26ML (MISCELLANEOUS) ×3 IMPLANT
COVER SURGICAL LIGHT HANDLE (MISCELLANEOUS) ×3 IMPLANT
COVER TRANSDUCER ULTRASND GEL (DRAPE) IMPLANT
CRADLE DONUT ADULT HEAD (MISCELLANEOUS) ×3 IMPLANT
DECANTER SPIKE VIAL GLASS SM (MISCELLANEOUS) ×3 IMPLANT
DERMABOND ADVANCED (GAUZE/BANDAGES/DRESSINGS) ×2
DERMABOND ADVANCED .7 DNX12 (GAUZE/BANDAGES/DRESSINGS) ×1 IMPLANT
DRAPE C-ARM 42X72 X-RAY (DRAPES) ×3 IMPLANT
DRAPE LAPAROSCOPIC ABDOMINAL (DRAPES) ×3 IMPLANT
DRAPE UTILITY XL STRL (DRAPES) ×6 IMPLANT
ELECT CAUTERY BLADE 6.4 (BLADE) ×3 IMPLANT
ELECT REM PT RETURN 9FT ADLT (ELECTROSURGICAL) ×3
ELECTRODE REM PT RTRN 9FT ADLT (ELECTROSURGICAL) ×1 IMPLANT
GAUZE SPONGE 4X4 16PLY XRAY LF (GAUZE/BANDAGES/DRESSINGS) ×3 IMPLANT
GLOVE BIOGEL PI IND STRL 7.0 (GLOVE) ×1 IMPLANT
GLOVE BIOGEL PI INDICATOR 7.0 (GLOVE) ×2
GLOVE EUDERMIC 7 POWDERFREE (GLOVE) ×3 IMPLANT
GOWN STRL REUS W/ TWL LRG LVL3 (GOWN DISPOSABLE) ×1 IMPLANT
GOWN STRL REUS W/ TWL XL LVL3 (GOWN DISPOSABLE) ×1 IMPLANT
GOWN STRL REUS W/TWL LRG LVL3 (GOWN DISPOSABLE) ×2
GOWN STRL REUS W/TWL XL LVL3 (GOWN DISPOSABLE) ×2
INTRODUCER 13FR (MISCELLANEOUS) IMPLANT
INTRODUCER COOK 11FR (CATHETERS) IMPLANT
KIT BASIN OR (CUSTOM PROCEDURE TRAY) ×3 IMPLANT
KIT PORT POWER 8FR ISP CVUE (Catheter) ×3 IMPLANT
KIT ROOM TURNOVER OR (KITS) ×3 IMPLANT
NEEDLE HYPO 25GX1X1/2 BEV (NEEDLE) ×9 IMPLANT
NS IRRIG 1000ML POUR BTL (IV SOLUTION) ×3 IMPLANT
PACK SURGICAL SETUP 50X90 (CUSTOM PROCEDURE TRAY) ×3 IMPLANT
PAD ARMBOARD 7.5X6 YLW CONV (MISCELLANEOUS) ×3 IMPLANT
PENCIL BUTTON HOLSTER BLD 10FT (ELECTRODE) ×3 IMPLANT
SET INTRODUCER 12FR PACEMAKER (SHEATH) IMPLANT
SET SHEATH INTRODUCER 10FR (MISCELLANEOUS) IMPLANT
SHEATH COOK PEEL AWAY SET 9F (SHEATH) IMPLANT
SURGILUBE 3G PEEL PACK STRL (MISCELLANEOUS) IMPLANT
SUT MNCRL AB 4-0 PS2 18 (SUTURE) ×3 IMPLANT
SUT PROLENE 2 0 CT2 30 (SUTURE) ×3 IMPLANT
SUT VIC AB 3-0 SH 18 (SUTURE) ×3 IMPLANT
SYR 5ML LUER SLIP (SYRINGE) ×3 IMPLANT
SYR CONTROL 10ML LL (SYRINGE) ×3 IMPLANT
SYRINGE 10CC LL (SYRINGE) ×9 IMPLANT
TOWEL OR 17X24 6PK STRL BLUE (TOWEL DISPOSABLE) IMPLANT
TOWEL OR 17X26 10 PK STRL BLUE (TOWEL DISPOSABLE) ×3 IMPLANT
TUBE CONNECTING 12'X1/4 (SUCTIONS)
TUBE CONNECTING 12X1/4 (SUCTIONS) IMPLANT
YANKAUER SUCT BULB TIP NO VENT (SUCTIONS) IMPLANT

## 2016-01-31 NOTE — Discharge Instructions (Signed)
    PORT-A-CATH: POST OP INSTRUCTIONS  Always review your discharge instruction sheet given to you by the facility where your surgery was performed.   1. A prescription for pain medication may be given to you upon discharge. Take your pain medication as prescribed, if needed. If narcotic pain medicine is not needed, then you make take acetaminophen (Tylenol) or ibuprofen (Advil) as needed.  2. Take your usually prescribed medications unless otherwise directed. 3. If you need a refill on your pain medication, please contact our office. All narcotic pain medicine now requires a paper prescription.  Phoned in and fax refills are no longer allowed by law.  Prescriptions will not be filled after 5 pm or on weekends.  4. You should follow a light diet for the remainder of the day after your procedure. 5. Most patients will experience some mild swelling and/or bruising in the area of the incision. It may take several days to resolve. 6. It is common to experience some constipation if taking pain medication after surgery. Increasing fluid intake and taking a stool softener (such as Colace) will usually help or prevent this problem from occurring. A mild laxative (Milk of Magnesia or Miralax) should be taken according to package directions if there are no bowel movements after 48 hours.  7. Unless discharge instructions indicate otherwise, you may remove your bandages 48 hours after surgery, and you may shower at that time. You may have steri-strips (small white skin tapes) in place directly over the incision.  These strips should be left on the skin for 7-10 days.  If your surgeon used Dermabond (skin glue) on the incision, you may shower in 24 hours.  The glue will flake off over the next 2-3 weeks.  8. If your port is left accessed at the end of surgery (needle left in port), the dressing cannot get wet and should only by changed by a healthcare professional. When the port is no longer accessed (when the  needle has been removed), follow step 7.   9. ACTIVITIES:  Limit activity involving your arms for the next 72 hours. Do no strenuous exercise or activity for 1 week. You may drive when you are no longer taking prescription pain medication, you can comfortably wear a seatbelt, and you can maneuver your car. 10.You may need to see your doctor in the office for a follow-up appointment.  Please       check with your doctor.  11.When you receive a new Port-a-Cath, you will get a product guide and        ID card.  Please keep them in case you need them.  WHEN TO CALL YOUR DOCTOR ([phone]): 1. Fever over 101.0 2. Chills 3. Continued bleeding from incision 4. Increased redness and tenderness at the site 5. Shortness of breath, difficulty breathing   The clinic staff is available to answer your questions during regular business hours. Please don't hesitate to call and ask to speak to one of the nurses or medical assistants for clinical concerns. If you have a medical emergency, go to the nearest emergency room or call 911.  A surgeon from [facility] is always on call at the hospital.     For further information, please visit [email or web address]      

## 2016-01-31 NOTE — Anesthesia Procedure Notes (Signed)
Procedure Name: LMA Insertion Date/Time: 01/31/2016 9:35 AM Performed by: Oletta Lamas Pre-anesthesia Checklist: Patient identified, Emergency Drugs available, Suction available and Patient being monitored Patient Re-evaluated:Patient Re-evaluated prior to inductionOxygen Delivery Method: Circle System Utilized Preoxygenation: Pre-oxygenation with 100% oxygen Intubation Type: IV induction Ventilation: Mask ventilation without difficulty LMA: LMA inserted LMA Size: 4.0 Number of attempts: 1 Placement Confirmation: positive ETCO2 Tube secured with: Tape Dental Injury: Teeth and Oropharynx as per pre-operative assessment

## 2016-01-31 NOTE — Anesthesia Preprocedure Evaluation (Addendum)
Anesthesia Evaluation  Patient identified by MRN, date of birth, ID band Patient awake    Reviewed: Allergy & Precautions, NPO status , Patient's Chart, lab work & pertinent test results  Airway Mallampati: III  TM Distance: >3 FB Neck ROM: Full    Dental no notable dental hx. (+) Teeth Intact   Pulmonary former smoker,    Pulmonary exam normal breath sounds clear to auscultation       Cardiovascular hypertension, Pt. 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   Endo/Other  diabetes, Well Controlled, Type 2, Oral Hypoglycemic AgentsHypothyroidism   Renal/GU negative Renal ROS  negative genitourinary   Musculoskeletal  (+) Arthritis ,   Abdominal Normal abdominal exam  (+)   Peds  Hematology  (+) anemia ,   Anesthesia Other Findings   Reproductive/Obstetrics negative OB ROS                            Anesthesia Physical Anesthesia Plan  ASA: II  Anesthesia Plan:    Post-op Pain Management:    Induction:   Airway Management Planned: LMA  Additional Equipment:   Intra-op Plan:   Post-operative Plan: Extubation in OR  Informed Consent: I have reviewed the patients History and Physical, chart, labs and discussed the procedure including the risks, benefits and alternatives for the proposed anesthesia with the patient or authorized representative who has indicated his/her understanding and acceptance.   Dental advisory given  Plan Discussed with: Anesthesiologist, CRNA and Surgeon  Anesthesia Plan Comments:         Anesthesia Quick Evaluation

## 2016-01-31 NOTE — Op Note (Signed)
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 Dalbert Batman, M.D., FACS  Assistant:                      OR staff  Operative Indications:   Judith Stephens a 75 y.o.female. She is brought to the OR for port a cath placement.      This patient originally underwent laparoscopic lysis of adhesions, extensive, right colectomy, and primary repair of ventral hernia on 03/14/2012. Final pathology showed a T4a., N0 tumor. 0/13 nodes positive. Preop CEA 1.0.  She is being followed closely by Dr. Julieanne Manson and had been on Xeloda chemotherapy, That has was discontinued due to side effects. .        She had a colonoscopy by Dr. Teena Irani on 03/13/2013. She had one benign polyp. Followup in 3 years was planned. She saw Dr. Benay Spice 04/22/2013. CEA was 0.9. She was felt to be cancer free. Six-month followup was planned      She was recently hospitalized 2 weeks ago for failure to thrive.  Work up revealed multiple liver and lung metastasis, and liver biopsy was positive for metastatic adenocarcinoma. She has anorexia, RUQ pain, insomnia, anemia, protein calorie malnutrition. Dr. Benay Spice advised FOLFOX/avastin to palliate her symptoms of pain and anorexia.  She agrees to try this and agrees to port a cath insertion.     She has met with the chemo nurses and has gone over all of this. I have discussed the indications, techniques and risks of port insertion, and she agrees to proceed.  Operative Findings:       The port and catheter were placed through the left subclavian vein without  difficulty.  The catheter tip appears to be positioned in the superior vena cava just above the right atrium.  In the operating room the catheter flushed well and had excellent blood return.  Procedure in Detail:          Following the induction of general LMA anesthesia the patient was positioned with a roll behind her shoulders and her arms tucked at her sides.  The neck and chest were prepped and draped in a sterile fashion.  Surgical timeout was performed.  Intravenous antibiotics were given.  0.5% Marcaine with epinephrine was used as local infiltration anesthetic.  A left subclavian venipuncture was performed with a single pass, good blood return, and a guidewire was inserted into the superior vena cava  under fluoroscopic guidance.  A small incision was made at the wire insertion site.  Using the C-arm fluoroscopy as a guide I marked a template on the chest wall to help guide catheter length and positioning so that the catheter tip would be in the superior vena cava just above the right atrium.  A transverse incision was made below the left clavicle, somewhat more medially.  A subcutaneous pocket was created at the level of the pectoralis fascia.  Using a tunneling device I passed  the catheter from the port pocket site to the wire insertion site.  Using the template marking the chest wall I measured the catheter and cut it 23.5 cm in length.  The catheter was secured to the port with the locking device and the port and catheter flushed with saline and heparin.  The port was sutured to the pectoralis fascia with 3 interrupted sutures of 2-0 Prolene.  The dilator and peel-away sheath were inserted over the guidewire into the central venous circulation.  The wire and dilator were removed, the catheter threaded easily and the peel-away sheath removed.  The catheter flushed well and had excellent blood return.  Fluoroscopy confirmed good positioning of the catheter tip in the superior vena cava just above the  right atrium.  There is no deformity of the catheter anywhere along its course.  The port and catheter were then flushed with concentrated heparin.  There was no bleeding.      The subcutaneous tissue was closed with 3-0 Vicryl sutures and the skin incisions closed with subcuticular 4-0 Monocryl and Dermabond.  The patient tolerated the procedure well and was taken to PACU in stable condition.  EBL 10 mL.  Counts correct.  Complications none.  Chest x-ray is planned for the PACU.     Edsel Petrin. Dalbert Batman, M.D., FACS General and Minimally Invasive Surgery Breast and Colorectal Surgery  01/31/2016 10:26 AM

## 2016-01-31 NOTE — Transfer of Care (Signed)
Immediate Anesthesia Transfer of Care Note  Patient: Landry Corporal  Procedure(s) Performed: Procedure(s): INSERTION PORT-A-CATH WITH ULTRA SOUND (N/A)  Patient Location: PACU  Anesthesia Type:General  Level of Consciousness: awake, alert , oriented and patient cooperative Drowsy but responds appropriately   Airway & Oxygen Therapy: Patient Spontanous Breathing and Patient connected to nasal cannula oxygen  Post-op Assessment: Report given to RN and Post -op Vital signs reviewed and stable  Post vital signs: Reviewed and stable  Last Vitals:  Vitals:   01/31/16 0821  BP: 134/79  Pulse: 100  Resp: 20  Temp: 37.2 C    Last Pain:  Vitals:   01/31/16 0821  TempSrc: Oral  PainSc:       Patients Stated Pain Goal: 5 ([id number])  Complications: No apparent anesthesia complications

## 2016-01-31 NOTE — Anesthesia Postprocedure Evaluation (Signed)
Anesthesia Post Note  Patient: Judith Stephens  Procedure(s) Performed: Procedure(s) (LRB): INSERTION PORT-A-CATH WITH ULTRA SOUND (N/A)  Patient location during evaluation: PACU Anesthesia Type: General Level of consciousness: awake and alert and oriented Pain management: pain level controlled Vital Signs Assessment: post-procedure vital signs reviewed and stable Respiratory status: nonlabored ventilation, spontaneous breathing and respiratory function stable Cardiovascular status: blood pressure returned to baseline and stable Postop Assessment: no signs of nausea or vomiting Anesthetic complications: no    Last Vitals:  Vitals:   01/31/16 0821  BP: 134/79  Pulse: 100  Resp: 20  Temp: 37.2 C    Last Pain:  Vitals:   01/31/16 0821  TempSrc: Oral  PainSc:                  Kelilah Hebard A.

## 2016-01-31 NOTE — Anesthesia Procedure Notes (Signed)
Performed by: Mariena Meares B       

## 2016-01-31 NOTE — Telephone Encounter (Signed)
Oncology Nurse Navigator Documentation  Oncology Nurse Navigator Flowsheets 01/31/2016  Navigator Location [facility]  Navigator Encounter Type Telephone  Telephone Incoming Call;Education--asking how to use the EMLA cream. Says the nurse today told her she can't use it tomorrow.  Patient Visit Type -  Treatment Phase -  Barriers/Navigation Needs -  Education -  Interventions Education Method--instructed her to bring her cream with her tomorrow and nurse will apply it for her during office visit.  Coordination of Care -  Education Method Verbal  Support Groups/Services -  Acuity -  Time Spent with Patient -

## 2016-02-01 ENCOUNTER — Other Ambulatory Visit: Payer: Self-pay | Admitting: *Deleted

## 2016-02-01 ENCOUNTER — Ambulatory Visit (HOSPITAL_BASED_OUTPATIENT_CLINIC_OR_DEPARTMENT_OTHER): Payer: Medicare Other | Admitting: Nurse Practitioner

## 2016-02-01 ENCOUNTER — Ambulatory Visit: Payer: Medicare Other | Admitting: Nutrition

## 2016-02-01 ENCOUNTER — Ambulatory Visit (HOSPITAL_BASED_OUTPATIENT_CLINIC_OR_DEPARTMENT_OTHER): Payer: Medicare Other

## 2016-02-01 ENCOUNTER — Encounter: Payer: Self-pay | Admitting: *Deleted

## 2016-02-01 ENCOUNTER — Encounter (HOSPITAL_COMMUNITY): Payer: Self-pay | Admitting: General Surgery

## 2016-02-01 VITALS — BP 125/84 | HR 96 | Temp 98.6°F | Resp 17 | Ht 70.0 in | Wt 186.5 lb

## 2016-02-01 DIAGNOSIS — C182 Malignant neoplasm of ascending colon: Secondary | ICD-10-CM

## 2016-02-01 DIAGNOSIS — I10 Essential (primary) hypertension: Secondary | ICD-10-CM | POA: Diagnosis not present

## 2016-02-01 DIAGNOSIS — C787 Secondary malignant neoplasm of liver and intrahepatic bile duct: Secondary | ICD-10-CM | POA: Diagnosis not present

## 2016-02-01 DIAGNOSIS — D509 Iron deficiency anemia, unspecified: Secondary | ICD-10-CM | POA: Diagnosis not present

## 2016-02-01 DIAGNOSIS — Z5111 Encounter for antineoplastic chemotherapy: Secondary | ICD-10-CM

## 2016-02-01 DIAGNOSIS — E119 Type 2 diabetes mellitus without complications: Secondary | ICD-10-CM

## 2016-02-01 DIAGNOSIS — C7800 Secondary malignant neoplasm of unspecified lung: Secondary | ICD-10-CM

## 2016-02-01 LAB — FECAL OCCULT BLOOD, GUAIAC: OCCULT BLOOD: POSITIVE

## 2016-02-01 MED ORDER — SODIUM CHLORIDE 0.9 % IV SOLN
2400.0000 mg/m2 | INTRAVENOUS | Status: DC
  Administered 2016-02-01: 4900 mg via INTRAVENOUS
  Filled 2016-02-01: qty 98

## 2016-02-01 MED ORDER — SODIUM CHLORIDE 0.9% FLUSH
10.0000 mL | INTRAVENOUS | Status: DC | PRN
  Filled 2016-02-01: qty 10

## 2016-02-01 MED ORDER — FLUOROURACIL CHEMO INJECTION 2.5 GM/50ML
400.0000 mg/m2 | Freq: Once | INTRAVENOUS | Status: AC
  Administered 2016-02-01: 800 mg via INTRAVENOUS
  Filled 2016-02-01: qty 16

## 2016-02-01 MED ORDER — PALONOSETRON HCL INJECTION 0.25 MG/5ML
INTRAVENOUS | Status: AC
  Filled 2016-02-01: qty 5

## 2016-02-01 MED ORDER — SODIUM CHLORIDE 0.9 % IV SOLN
10.0000 mg | Freq: Once | INTRAVENOUS | Status: AC
  Administered 2016-02-01: 10 mg via INTRAVENOUS
  Filled 2016-02-01: qty 1

## 2016-02-01 MED ORDER — PROCHLORPERAZINE MALEATE 5 MG PO TABS: 5.0000 mg | ORAL_TABLET | Freq: Four times a day (QID) | ORAL | 1 refills | Status: DC | PRN

## 2016-02-01 MED ORDER — OXALIPLATIN CHEMO INJECTION 100 MG/20ML
85.0000 mg/m2 | Freq: Once | INTRAVENOUS | Status: AC
  Administered 2016-02-01: 175 mg via INTRAVENOUS
  Filled 2016-02-01: qty 35

## 2016-02-01 MED ORDER — PALONOSETRON HCL INJECTION 0.25 MG/5ML
0.2500 mg | Freq: Once | INTRAVENOUS | Status: AC
  Administered 2016-02-01: 0.25 mg via INTRAVENOUS

## 2016-02-01 MED ORDER — LEUCOVORIN CALCIUM INJECTION 350 MG
400.0000 mg/m2 | Freq: Once | INTRAVENOUS | Status: AC
  Administered 2016-02-01: 820 mg via INTRAVENOUS
  Filled 2016-02-01: qty 41

## 2016-02-01 MED ORDER — DEXTROSE 5 % IV SOLN
Freq: Once | INTRAVENOUS | Status: AC
  Administered 2016-02-01: 14:00:00 via INTRAVENOUS

## 2016-02-01 NOTE — Progress Notes (Signed)
Nutrition follow-up completed with patient during chemotherapy. Patient reports poor appetite continues and she is still forcing herself to eat. Her constipation has improved. She denies nausea, vomiting. She is trying to drink oral nutrition supplements. Weight is stable at 186 pounds.  Nutrition diagnosis: Malnutrition continues.  Intervention:  Provided support and encouragement for patient to continue frequent meals and snacks utilizing high protein foods and high-calorie foods. Encouraged patient to continue to drink oral nutrition supplements. Questions were answered.  Teach back method was used.  Monitoring, evaluation, goals:  Patient will work to increase oral intake to promote weight maintenance.  Next visit: To be scheduled as needed.  **Disclaimer: This note was dictated with voice recognition software. Similar sounding words can inadvertently be transcribed and this note may contain transcription errors which may not have been corrected upon publication of note.**

## 2016-02-01 NOTE — Progress Notes (Signed)
Oncology Nurse Navigator Documentation  Oncology Nurse Navigator Flowsheets 02/01/2016  Navigator Location [facility]  Navigator Encounter Type Treatment  Telephone -  Treatment Initiated Date 02/01/2016  Patient Visit Type [facility]  Treatment Phase Active Tx--1st FOLFOX  Barriers/Navigation Needs No barriers at this time;No Questions  Education Other--encouraged her to take pain meds when needed and the goal is for her pain and nausea to improve with the chemo.  Interventions Education Method  Coordination of Care -  Education Method Verbal  Support Groups/Services -  Acuity Level 1  Time Spent with Patient 15  Met with Judith Stephens and her husband in tx area-she was eating ice cream before she gets the oxaliplatin today. Had collaborative nurse apply her EMLA cream for her when she checked in today. Patient reports it was a good stick.

## 2016-02-01 NOTE — Progress Notes (Signed)
Per Ned Card NP okay to treat using labs from 01/28/16. Avastin will be held today due to recent port placement per Ned Card NP.

## 2016-02-01 NOTE — Patient Instructions (Signed)
[facility] Discharge Instructions for Patients Receiving Chemotherapy  Today you received the following chemotherapy agents Oxaliplatin, Leucovorin and Flourouracil To help prevent nausea and vomiting after your treatment, we encourage you to take your nausea medication as directed.  Take Compazine for 3 days after chemo if have nausea and/or vomiting instead of Zofran.     If you develop nausea and vomiting that is not controlled by your nausea medication, call the clinic.   BELOW ARE SYMPTOMS THAT SHOULD BE REPORTED IMMEDIATELY:  *FEVER GREATER THAN 100.5 F  *CHILLS WITH OR WITHOUT FEVER  NAUSEA AND VOMITING THAT IS NOT CONTROLLED WITH YOUR NAUSEA MEDICATION  *UNUSUAL SHORTNESS OF BREATH  *UNUSUAL BRUISING OR BLEEDING  TENDERNESS IN MOUTH AND THROAT WITH OR WITHOUT PRESENCE OF ULCERS  *URINARY PROBLEMS  *BOWEL PROBLEMS  UNUSUAL RASH Items with * indicate a potential emergency and should be followed up as soon as possible.  Feel free to call the clinic you have any questions or concerns. The clinic phone number is [phone].  Please show the [facility] at check-in to the Emergency Department and triage nurse.   Oxaliplatin Injection What is this medicine? OXALIPLATIN (ox AL i PLA tin) is a chemotherapy drug. It targets fast dividing cells, like cancer cells, and causes these cells to die. This medicine is used to treat cancers of the colon and rectum, and many other cancers. This medicine may be used for other purposes; ask your health care provider or pharmacist if you have questions. What should I tell my health care provider before I take this medicine? They need to know if you have any of these conditions: -kidney disease -an unusual or allergic reaction to oxaliplatin, other chemotherapy, other medicines, foods, dyes, or preservatives -pregnant or trying to get pregnant -breast-feeding How should I use this medicine? This drug is given as  an infusion into a vein. It is administered in a hospital or clinic by a specially trained health care professional. Talk to your pediatrician regarding the use of this medicine in children. Special care may be needed. Overdosage: If you think you have taken too much of this medicine contact a poison control center or emergency room at once. NOTE: This medicine is only for you. Do not share this medicine with others. What if I miss a dose? It is important not to miss a dose. Call your doctor or health care professional if you are unable to keep an appointment. What may interact with this medicine? -medicines to increase blood counts like filgrastim, pegfilgrastim, sargramostim -probenecid -some antibiotics like amikacin, gentamicin, neomycin, polymyxin B, streptomycin, tobramycin -zalcitabine Talk to your doctor or health care professional before taking any of these medicines: -acetaminophen -aspirin -ibuprofen -ketoprofen -naproxen This list may not describe all possible interactions. Give your health care provider a list of all the medicines, herbs, non-prescription drugs, or dietary supplements you use. Also tell them if you smoke, drink alcohol, or use illegal drugs. Some items may interact with your medicine. What should I watch for while using this medicine? Your condition will be monitored carefully while you are receiving this medicine. You will need important blood work done while you are taking this medicine. This medicine can make you more sensitive to cold. Do not drink cold drinks or use ice. Cover exposed skin before coming in contact with cold temperatures or cold objects. When out in cold weather wear warm clothing and cover your mouth and nose to warm the air that goes  into your lungs. Tell your doctor if you get sensitive to the cold. This drug may make you feel generally unwell. This is not uncommon, as chemotherapy can affect healthy cells as well as cancer cells. Report any  side effects. Continue your course of treatment even though you feel ill unless your doctor tells you to stop. In some cases, you may be given additional medicines to help with side effects. Follow all directions for their use. Call your doctor or health care professional for advice if you get a fever, chills or sore throat, or other symptoms of a cold or flu. Do not treat yourself. This drug decreases your body's ability to fight infections. Try to avoid being around people who are sick. This medicine may increase your risk to bruise or bleed. Call your doctor or health care professional if you notice any unusual bleeding. Be careful brushing and flossing your teeth or using a toothpick because you may get an infection or bleed more easily. If you have any dental work done, tell your dentist you are receiving this medicine. Avoid taking products that contain aspirin, acetaminophen, ibuprofen, naproxen, or ketoprofen unless instructed by your doctor. These medicines may hide a fever. Do not become pregnant while taking this medicine. Women should inform their doctor if they wish to become pregnant or think they might be pregnant. There is a potential for serious side effects to an unborn child. Talk to your health care professional or pharmacist for more information. Do not breast-feed an infant while taking this medicine. Call your doctor or health care professional if you get diarrhea. Do not treat yourself. What side effects may I notice from receiving this medicine? Side effects that you should report to your doctor or health care professional as soon as possible: -allergic reactions like skin rash, itching or hives, swelling of the face, lips, or tongue -low blood counts - This drug may decrease the number of white blood cells, red blood cells and platelets. You may be at increased risk for infections and bleeding. -signs of infection - fever or chills, cough, sore throat, pain or difficulty passing  urine -signs of decreased platelets or bleeding - bruising, pinpoint red spots on the skin, black, tarry stools, nosebleeds -signs of decreased red blood cells - unusually weak or tired, fainting spells, lightheadedness -breathing problems -chest pain, pressure -cough -diarrhea -jaw tightness -mouth sores -nausea and vomiting -pain, swelling, redness or irritation at the injection site -pain, tingling, numbness in the hands or feet -problems with balance, talking, walking -redness, blistering, peeling or loosening of the skin, including inside the mouth -trouble passing urine or change in the amount of urine Side effects that usually do not require medical attention (report to your doctor or health care professional if they continue or are bothersome): -changes in vision -constipation -hair loss -loss of appetite -metallic taste in the mouth or changes in taste -stomach pain This list may not describe all possible side effects. Call your doctor for medical advice about side effects. You may report side effects to FDA at 1-800-FDA-1088. Where should I keep my medicine? This drug is given in a hospital or clinic and will not be stored at home. NOTE: This sheet is a summary. It may not cover all possible information. If you have questions about this medicine, talk to your doctor, pharmacist, or health care provider.    2016, Elsevier/Gold Standard. (2007-12-31 17:22:47)  Leucovorin injection What is this medicine? LEUCOVORIN (loo koe VOR in) is used to  prevent or treat the harmful effects of some medicines. This medicine is used to treat anemia caused by a low amount of folic acid in the body. It is also used with 5-fluorouracil (5-FU) to treat colon cancer. This medicine may be used for other purposes; ask your health care provider or pharmacist if you have questions. What should I tell my health care provider before I take this medicine? They need to know if you have any of these  conditions: -anemia from low levels of vitamin B-12 in the blood -an unusual or allergic reaction to leucovorin, folic acid, other medicines, foods, dyes, or preservatives -pregnant or trying to get pregnant -breast-feeding How should I use this medicine? This medicine is for injection into a muscle or into a vein. It is given by a health care professional in a hospital or clinic setting. Talk to your pediatrician regarding the use of this medicine in children. Special care may be needed. Overdosage: If you think you have taken too much of this medicine contact a poison control center or emergency room at once. NOTE: This medicine is only for you. Do not share this medicine with others. What if I miss a dose? This does not apply. What may interact with this medicine? -capecitabine -fluorouracil -phenobarbital -phenytoin -primidone -trimethoprim-sulfamethoxazole This list may not describe all possible interactions. Give your health care provider a list of all the medicines, herbs, non-prescription drugs, or dietary supplements you use. Also tell them if you smoke, drink alcohol, or use illegal drugs. Some items may interact with your medicine. What should I watch for while using this medicine? Your condition will be monitored carefully while you are receiving this medicine. This medicine may increase the side effects of 5-fluorouracil, 5-FU. Tell your doctor or health care professional if you have diarrhea or mouth sores that do not get better or that get worse. What side effects may I notice from receiving this medicine? Side effects that you should report to your doctor or health care professional as soon as possible: -allergic reactions like skin rash, itching or hives, swelling of the face, lips, or tongue -breathing problems -fever, infection -mouth sores -unusual bleeding or bruising -unusually weak or tired Side effects that usually do not require medical attention (report to  your doctor or health care professional if they continue or are bothersome): -constipation or diarrhea -loss of appetite -nausea, vomiting This list may not describe all possible side effects. Call your doctor for medical advice about side effects. You may report side effects to FDA at 1-800-FDA-1088. Where should I keep my medicine? This drug is given in a hospital or clinic and will not be stored at home. NOTE: This sheet is a summary. It may not cover all possible information. If you have questions about this medicine, talk to your doctor, pharmacist, or health care provider.    2016, Elsevier/Gold Standard. (2007-12-10 16:50:29)  Fluorouracil, 5FU; Diclofenac topical cream What is this medicine? FLUOROURACIL; DICLOFENAC (flure oh YOOR a sil; dye KLOE fen ak) is a combination of a topical chemotherapy agent and non-steroidal anti-inflammatory drug (NSAID). It is used on the skin to treat skin cancer and skin conditions that could become cancer. This medicine may be used for other purposes; ask your health care provider or pharmacist if you have questions. What should I tell my health care provider before I take this medicine? They need to know if you have any of these conditions: -bleeding problems -cigarette smoker -DPD enzyme deficiency -heart disease -high blood  pressure -if you frequently drink alcohol containing drinks -kidney disease -liver disease -open or infected skin -stomach problems -swelling or open sores at the treatment site -recent or planned coronary artery bypass graft (CABG) surgery -an unusual or allergic reaction to fluorouracil, diclofenac, aspirin, other NSAIDs, other medicines, foods, dyes, or preservatives -pregnant or trying to get pregnant -breast-feeding How should I use this medicine? This medicine is only for use on the skin. Follow the directions on the prescription label. Wash hands before and after use. Wash affected area and gently pat dry. To  apply this medicine use a cotton-tipped applicator, or use gloves if applying with fingertips. If applied with unprotected fingertips, it is very important to wash your hands well after you apply this medicine. Avoid applying to the eyes, nose, or mouth. Apply enough medicine to cover the affected area. You can cover the area with a light gauze dressing, but do not use tight or air-tight dressings. Finish the full course prescribed by your doctor or health care professional, even if you think your condition is better. Do not stop taking except on the advice of your doctor or health care professional. Talk to your pediatrician regarding the use of this medicine in children. Special care may be needed. Overdosage: If you think you have taken too much of this medicine contact a poison control center or emergency room at once. NOTE: This medicine is only for you. Do not share this medicine with others. What if I miss a dose? If you miss a dose, apply it as soon as you can. If it is almost time for your next dose, only use that dose. Do not apply extra doses. Contact your doctor or health care professional if you miss more than one dose. What may interact with this medicine? Interactions are not expected. Do not use any other skin products without telling your doctor or health care professional. This list may not describe all possible interactions. Give your health care provider a list of all the medicines, herbs, non-prescription drugs, or dietary supplements you use. Also tell them if you smoke, drink alcohol, or use illegal drugs. Some items may interact with your medicine. What should I watch for while using this medicine? Visit your doctor or health care professional for checks on your progress. You will need to use this medicine for 2 to 6 weeks. This may be longer depending on the condition being treated. You may not see full healing for another 1 to 2 months after you stop using the medicine. Treated  areas of skin can look unsightly during and for several weeks after treatment with this medicine. This medicine can make you more sensitive to the sun. Keep out of the sun. If you cannot avoid being in the sun, wear protective clothing and use sunscreen. Do not use sun lamps or tanning beds/booths. Where should I keep my What side effects may I notice from receiving this medicine? Side effects that you should report to your doctor or health care professional as soon as possible: -allergic reactions like skin rash, itching or hives, swelling of the face, lips, or tongue -black or bloody stools, blood in the urine or vomit -blurred vision -chest pain -difficulty breathing or wheezing -redness, blistering, peeling or loosening of the skin, including inside the mouth -severe redness and swelling of normal skin -slurred speech or weakness on one side of the body -trouble passing urine or change in the amount of urine -unexplained weight gain or swelling -unusually weak or  tired -yellowing of eyes or skin Side effects that usually do not require medical attention (Report these to your doctor or health care professional if they continue or are bothersome.): -increased sensitivity of the skin to sun and ultraviolet light -pain and burning of the affected area -scaling or swelling of the affected area -skin rash, itching of the affected area -tenderness This list may not describe all possible side effects. Call your doctor for medical advice about side effects. You may report side effects to FDA at 1-800-FDA-1088. Where should I keep my medicine? Keep out of the reach of children. Store at room temperature between 20 and 25 degrees C (68 and 77 degrees F). Throw away any unused medicine after the expiration date. NOTE: This sheet is a summary. It may not cover all possible information. If you have questions about this medicine, talk to your doctor, pharmacist, or health care provider.    2016,  Elsevier/Gold Standard. (2013-10-06 11:09:58)

## 2016-02-01 NOTE — Progress Notes (Signed)
[facility] OFFICE PROGRESS NOTE   Diagnosis:  Colon cancer  INTERVAL HISTORY:   Judith Stephens returns as scheduled. She has intermittent discomfort at the right abdomen. She has pain medication at home. She has some nausea. Bowels are moving. She denies any bleeding.  Objective:  Vital signs in last 24 hours:  Blood pressure 125/84, pulse 96, temperature 98.6 F (37 C), temperature source Oral, resp. rate 17, height 5\' 10"  (1.778 m), weight 186 lb 8 oz (84.6 kg), SpO2 97 %.    HEENT: No thrush or ulcers. Resp: Breath sounds diminished right lower lung field. No respiratory distress. Cardio: Regular rate and rhythm. GI: Liver palpable right upper abdomen. Vascular: No leg edema.  Port-A-Cath without erythema.  Lab Results:  Lab Results  Component Value Date   WBC 14.9 (H) 01/28/2016   HGB 8.4 (L) 01/28/2016   HCT 27.3 (L) 01/28/2016   MCV 81.7 01/28/2016   PLT 613 (H) 01/28/2016   NEUTROABS 10.9 (H) 01/28/2016    Imaging:  Dg Chest Port 1 View  Result Date: 01/31/2016 CLINICAL DATA:  Status post Port-A-Cath placement. EXAM: PORTABLE CHEST 1 VIEW COMPARISON:  Chest radiograph 01/09/2016 and CT 01/12/2016 FINDINGS: A left subclavian Port-A-Cath has been placed and terminates over the lower SVC. The patient is mildly rotated to the right which mildly limits assessment of the mediastinal contours. The cardiomediastinal silhouette is grossly unchanged. Lung volumes are diminished with curvilinear opacities in the right lung base consistent with atelectasis. No pleural effusion or pneumothorax is identified. No acute osseous abnormality is seen. IMPRESSION: 1. Interval Port-A-Cath placement, terminating over the lower SVC. No pneumothorax. 2. Low lung volumes with right basilar atelectasis. Electronically Signed   By: Logan Bores M.D.   On: 01/31/2016 11:51   Dg Fluoro Guide Cv Line-no Report  Result Date: 01/31/2016 CLINICAL DATA:  FLOURO GUIDE CV LINE Fluoroscopy  was utilized by the requesting physician.  No radiographic interpretation.    Medications: I have reviewed the patient's current medications.  Assessment/Plan: 1. 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 She began adjuvant Xeloda on 05/14/2012. Final cycle of Xeloda started on 10/22/2012.  CT abdomen/pelvis 01/10/2016-innumerable hepatic metastases  CT chest 01/12/2016-multiple pulmonary nodules consistent with metastases  Cycle 1 FOLFOX 02/01/2016 2. Severe microcytic anemia likely secondary to iron deficiency related to bleeding from the colon tumor. The hemoglobin and MCV improved on oral iron. Oral iron was discontinued following office visit 12/16/2012.  Recurrent microcytic anemia July 2017 3. Diabetes. 4. Hypertension. 5. Hyperlipidemia. 6. History of hand/foot syndrome secondary to Xeloda prompting the Xeloda to be discontinued after day 12, cycle 2. Improved. Xeloda was dose reduced beginning with cycle 3. 7. History of mild diarrhea secondary to Xeloda.  8. History of eye tearing-most likely related to seasonal allergies though this could be a manifestation of Xeloda toxicity.  9. Status post root canal 10/02/2012. 10. Colonoscopy 03/13/2013 by Dr. Amedeo Plenty. Pathology on transverse colon polyp showed a tubular adenoma. Next colonoscopy recommendedat a three-year interval. 11. Anorexia/weight loss 12. Port-A-Cath placement by Dr. Dalbert Batman 01/31/2016   Disposition: Judith Stephens appears stable. She had a Port-A-Cath placed yesterday. She has attended the chemotherapy education class. Potential toxicities associated with the FOLFOX regimen again reviewed at today's visit. She is agreeable to proceed with cycle 1 FOLFOX today as scheduled. Plan to add Avastin in 2 weeks with cycle 2.  She has a recurrent microcytic anemia. Stool cards  were turned into the lab today.  She will return for a follow-up  visit and cycle 2 FOLFOX plus Avastin in 2 weeks. She will contact the office in the interim with any problems.  Patient seen with Dr. Benay Spice.    Ned Card ANP/GNP-BC   02/01/2016  12:41 PM This was a shared visit with Ned Card. Judith Stephens will begin FOLFOX chemotherapy today. The plan is to add Avastin with cycle 2. She has microcytic anemia. We will consider a GI referral if the stool is Hemoccult positive.  Julieanne Manson, M.D.

## 2016-02-03 ENCOUNTER — Ambulatory Visit (HOSPITAL_BASED_OUTPATIENT_CLINIC_OR_DEPARTMENT_OTHER): Payer: Medicare Other | Admitting: Nurse Practitioner

## 2016-02-03 ENCOUNTER — Ambulatory Visit (HOSPITAL_BASED_OUTPATIENT_CLINIC_OR_DEPARTMENT_OTHER): Payer: Medicare Other

## 2016-02-03 VITALS — BP 118/67 | HR 108 | Resp 20

## 2016-02-03 DIAGNOSIS — C182 Malignant neoplasm of ascending colon: Secondary | ICD-10-CM

## 2016-02-03 DIAGNOSIS — E860 Dehydration: Secondary | ICD-10-CM | POA: Diagnosis not present

## 2016-02-03 DIAGNOSIS — C19 Malignant neoplasm of rectosigmoid junction: Secondary | ICD-10-CM

## 2016-02-03 DIAGNOSIS — E876 Hypokalemia: Secondary | ICD-10-CM

## 2016-02-03 DIAGNOSIS — C189 Malignant neoplasm of colon, unspecified: Secondary | ICD-10-CM | POA: Diagnosis not present

## 2016-02-03 DIAGNOSIS — C787 Secondary malignant neoplasm of liver and intrahepatic bile duct: Secondary | ICD-10-CM

## 2016-02-03 DIAGNOSIS — Z452 Encounter for adjustment and management of vascular access device: Secondary | ICD-10-CM | POA: Diagnosis present

## 2016-02-03 DIAGNOSIS — C7800 Secondary malignant neoplasm of unspecified lung: Secondary | ICD-10-CM

## 2016-02-03 LAB — CBC WITH DIFFERENTIAL/PLATELET
BASO%: 0.3 % (ref 0.0–2.0)
BASOS ABS: 0 10*3/uL (ref 0.0–0.1)
EOS%: 0.3 % (ref 0.0–7.0)
Eosinophils Absolute: 0 10*3/uL (ref 0.0–0.5)
HCT: 27.4 % — ABNORMAL LOW (ref 34.8–46.6)
HGB: 8.5 g/dL — ABNORMAL LOW (ref 11.6–15.9)
LYMPH#: 2 10*3/uL (ref 0.9–3.3)
LYMPH%: 17.3 % (ref 14.0–49.7)
MCH: 24.6 pg — ABNORMAL LOW (ref 25.1–34.0)
MCHC: 31 g/dL — ABNORMAL LOW (ref 31.5–36.0)
MCV: 79.4 fL — AB (ref 79.5–101.0)
MONO#: 0.4 10*3/uL (ref 0.1–0.9)
MONO%: 3.2 % (ref 0.0–14.0)
NEUT%: 78.9 % — ABNORMAL HIGH (ref 38.4–76.8)
NEUTROS ABS: 9.2 10*3/uL — AB (ref 1.5–6.5)
NRBC: 0 % (ref 0–0)
Platelets: 710 10*3/uL — ABNORMAL HIGH (ref 145–400)
RBC: 3.45 10*6/uL — AB (ref 3.70–5.45)
RDW: 17.2 % — AB (ref 11.2–14.5)
WBC: 11.6 10*3/uL — AB (ref 3.9–10.3)

## 2016-02-03 LAB — COMPREHENSIVE METABOLIC PANEL
ALT: 16 U/L (ref 0–55)
AST: 92 U/L — AB (ref 5–34)
Albumin: 2.1 g/dL — ABNORMAL LOW (ref 3.5–5.0)
Alkaline Phosphatase: 121 U/L (ref 40–150)
Anion Gap: 9 mEq/L (ref 3–11)
BILIRUBIN TOTAL: 0.44 mg/dL (ref 0.20–1.20)
BUN: 12.2 mg/dL (ref 7.0–26.0)
CO2: 24 meq/L (ref 22–29)
Calcium: 9.5 mg/dL (ref 8.4–10.4)
Chloride: 102 mEq/L (ref 98–109)
Creatinine: 0.8 mg/dL (ref 0.6–1.1)
GLUCOSE: 209 mg/dL — AB (ref 70–140)
Potassium: 3.4 mEq/L — ABNORMAL LOW (ref 3.5–5.1)
SODIUM: 134 meq/L — AB (ref 136–145)
TOTAL PROTEIN: 7.3 g/dL (ref 6.4–8.3)

## 2016-02-03 MED ORDER — SODIUM CHLORIDE 0.9% FLUSH
10.0000 mL | INTRAVENOUS | Status: AC | PRN
  Filled 2016-02-03: qty 10

## 2016-02-03 MED ORDER — SODIUM CHLORIDE 0.9 % IV SOLN
INTRAVENOUS | Status: AC
  Administered 2016-02-03: 15:00:00 via INTRAVENOUS

## 2016-02-03 MED ORDER — SODIUM CHLORIDE 0.9% FLUSH
10.0000 mL | INTRAVENOUS | Status: DC | PRN
  Administered 2016-02-03 (×2): 10 mL
  Filled 2016-02-03: qty 10

## 2016-02-03 MED ORDER — HEPARIN SOD (PORK) LOCK FLUSH 100 UNIT/ML IV SOLN
500.0000 [IU] | Freq: Once | INTRAVENOUS | Status: AC
  Administered 2016-02-03: 500 [IU] via INTRAVENOUS
  Filled 2016-02-03: qty 5

## 2016-02-03 MED ORDER — HEPARIN SOD (PORK) LOCK FLUSH 100 UNIT/ML IV SOLN
500.0000 [IU] | Freq: Once | INTRAVENOUS | Status: AC | PRN
  Administered 2016-02-03: 500 [IU]
  Filled 2016-02-03: qty 5

## 2016-02-03 MED ORDER — SODIUM CHLORIDE 0.9 % IV SOLN
Freq: Once | INTRAVENOUS | Status: AC
  Administered 2016-02-03: 16:00:00 via INTRAVENOUS

## 2016-02-03 NOTE — Progress Notes (Unsigned)
1415- Pt here for pump stop. Pt states that she started feeling dizzy since last night. Had tried to hydrate in the last 2 days, but not eating as much as she should. Pt states that she did take her norvasc this morning. She doesn't take anything for her hr and is not aware of having any tachycardia. No other reported symptoms at this time. Pump disconnected. C.Bacon,NP (s.s.) aware and will come see pt in infusion today. Started pt on IVF's until further updates.

## 2016-02-03 NOTE — Progress Notes (Unsigned)
Small  area of skin separation above port site . I instructed pt and family to keep port site clean and dry and to call for any signs and symptoms of infection

## 2016-02-04 ENCOUNTER — Telehealth: Payer: Self-pay | Admitting: Oncology

## 2016-02-04 ENCOUNTER — Telehealth: Payer: Self-pay | Admitting: *Deleted

## 2016-02-04 NOTE — Telephone Encounter (Signed)
Pt states "I'm not having any problems, can I take a shower, they took my chemo and need out yesterday." Discussed with pt as long as her needle and chemo have been removed ok to take shower. No further concerns.

## 2016-02-04 NOTE — Telephone Encounter (Signed)
Appointment conf with patient.

## 2016-02-04 NOTE — Telephone Encounter (Signed)
-----   Message from Wilmon Arms, RN sent at 02/01/2016  4:51 PM EDT ----- Regarding: Dr. Benay Spice 1st fox treatment Contact: [phone] 1st time Folfox treatment, tolerated well.

## 2016-02-06 ENCOUNTER — Encounter: Payer: Self-pay | Admitting: Nurse Practitioner

## 2016-02-06 DIAGNOSIS — E860 Dehydration: Secondary | ICD-10-CM | POA: Insufficient documentation

## 2016-02-06 NOTE — Assessment & Plan Note (Signed)
Patient's potassium was down to 3.4 today.  Patient does appear mildly dehydrated today as well.  Patient was encouraged to push potassium in her diet is much as possible.

## 2016-02-06 NOTE — Assessment & Plan Note (Signed)
Patient has had decreased oral intake recently; secondary to nausea.  She denies any vomiting recently.  She denies any other new symptoms whatsoever.  She denies any recent fevers or chills.  Vital signs revealed blood pressure initially 122/75 with a heart rate of 125.  After receiving IV fluid rehydration-patient's blood pressure was 118/67 and heart rate had improved down to 108.  Patient's sodium level has actually improved from 130 up to 134 today.  Patient will receive IV fluid rehydration while at the cancer Center today.  Patient was encouraged to push fluids as much as possible.  She was advised to call/return or go directly to the emergency department she develops any worsening symptoms whatsoever.

## 2016-02-06 NOTE — Progress Notes (Signed)
SYMPTOM MANAGEMENT CLINIC    Chief Complaint: Dehydration  HPI:  [age] y.o. female diagnosed with colon cancer with both liver and lung metastasis.  Currently undergoing FOLFOX chemotherapy regimen.  The plan is to add Avastin into patient's chemotherapy regimen with cycle 2 of her chemotherapy.    No history exists.    Review of Systems  Constitutional: Positive for malaise/fatigue and weight loss.  Gastrointestinal: Positive for nausea. Negative for vomiting.  All other systems reviewed and are negative.   Past Medical History:  Diagnosis Date  . Anemia    hx of blood transfusion 03/06/12   . Anxiety   . Arthritis   . Blood transfusion   . Cancer [facility])    colon cancer  . Diabetes mellitus   . Hyperlipidemia   . Hypertension   . Hypothyroidism     Past Surgical History:  Procedure Laterality Date  . COLON SURGERY  2013  . cyst on ovary Right    removed  . LAPAROSCOPY  03/14/2012   Procedure: LAPAROSCOPY DIAGNOSTIC;  Surgeon: Adin Hector, MD;  Location: [facility];  Service: General;;  right colectomy  . PORTACATH PLACEMENT N/A 01/31/2016   Procedure: INSERTION PORT-A-CATH WITH ULTRA SOUND;  Surgeon: Fanny Skates, MD;  Location: [facility];  Service: General;  Laterality: N/A;  . TUBAL LIGATION      has Anemia; Colon cancer ([place]); Diabetes mellitus ([place]); HTN (hypertension); Incisional hernia; Hyponatremia; Hypokalemia; Liver metastasis ([place]); Lung metastasis ([place]); and Dehydration on her problem list.    is allergic to no known allergies.    Medication List       Accurate as of 02/03/16 11:59 PM. Always use your most recent med list.          amLODipine 10 MG tablet Commonly known as:  NORVASC Take 10 mg by mouth daily.   aspirin EC 81 MG tablet Take 81 mg by mouth daily.   fluticasone 50 MCG/ACT nasal spray Commonly known as:  FLONASE Place 2 sprays into the nose daily as needed for rhinitis. For allergies   glipiZIDE 10 MG tablet Commonly  known as:  GLUCOTROL Take 10 mg by mouth daily.   HYDROcodone-acetaminophen 5-325 MG tablet Commonly known as:  NORCO/VICODIN Take 1 tablet by mouth every 6 (six) hours as needed for moderate pain.   HYDROcodone-acetaminophen 5-325 MG tablet Commonly known as:  NORCO/VICODIN Take 1-2 tablets by mouth every 4 (four) hours as needed for moderate pain or severe pain.   levothyroxine 150 MCG tablet Commonly known as:  SYNTHROID, LEVOTHROID Take 150 mcg by mouth daily before breakfast.   lidocaine-prilocaine cream Commonly known as:  EMLA Apply 1 application topically as needed. Apply 1 hr prior to port access and cover with plastic wrap   loratadine 10 MG tablet Commonly known as:  CLARITIN Take 10 mg by mouth daily as needed for allergies.   LORazepam 0.5 MG tablet Commonly known as:  ATIVAN Take 1 tablet (0.5 mg total) by mouth at bedtime as needed for anxiety.   metFORMIN 500 MG tablet Commonly known as:  GLUCOPHAGE Take 500 mg by mouth daily with breakfast.   montelukast 10 MG tablet Commonly known as:  SINGULAIR Take 10 mg by mouth every evening.   ondansetron 8 MG tablet Commonly known as:  ZOFRAN Take 1 tablet (8 mg total) by mouth every 8 (eight) hours as needed for nausea or vomiting.   prochlorperazine 5 MG tablet Commonly known as:  COMPAZINE Take 1  tablet (5 mg total) by mouth every 6 (six) hours as needed for nausea or vomiting.   rosuvastatin 10 MG tablet Commonly known as:  CRESTOR Take 10 mg by mouth every Monday, Wednesday, and Friday.        PHYSICAL EXAMINATION  Oncology Vitals 02/03/2016 02/03/2016  Height - -  Weight - -  Weight (lbs) - -  BMI (kg/m2) - -  Temp - -  Pulse 108 125  Resp - 20  SpO2 - 100  BSA (m2) - -   BP Readings from Last 2 Encounters:  02/03/16 118/67  02/01/16 125/84    Physical Exam  Constitutional: She is oriented to person, place, and time. Vital signs are normal. She appears malnourished and dehydrated. She  appears unhealthy. She appears cachectic.  HENT:  Head: Normocephalic and atraumatic.  Mouth/Throat: Oropharynx is clear and moist.  Eyes: Conjunctivae and EOM are normal. Pupils are equal, round, and reactive to light. Right eye exhibits no discharge. Left eye exhibits no discharge. No scleral icterus.  Neck: Normal range of motion. Neck supple. No JVD present. No tracheal deviation present. No thyromegaly present.  Cardiovascular: Normal rate, regular rhythm, normal heart sounds and intact distal pulses.   Pulmonary/Chest: Effort normal and breath sounds normal. No respiratory distress. She has no wheezes. She has no rales. She exhibits no tenderness.  Abdominal: Soft. Bowel sounds are normal. She exhibits no distension and no mass. There is no tenderness. There is no rebound and no guarding.  Musculoskeletal: Normal range of motion. She exhibits no edema or tenderness.  Lymphadenopathy:    She has no cervical adenopathy.  Neurological: She is alert and oriented to person, place, and time. Gait normal.  Skin: Skin is warm and dry. No rash noted. No erythema. No pallor.  Psychiatric: Affect normal.  Nursing note and vitals reviewed.   LABORATORY DATA:. Appointment on 02/03/2016  Component Date Value Ref Range Status  . WBC 02/03/2016 11.6* 3.9 - 10.3 10e3/uL Final  . NEUT# 02/03/2016 9.2* 1.5 - 6.5 10e3/uL Final  . HGB 02/03/2016 8.5* 11.6 - 15.9 g/dL Final  . HCT 02/03/2016 27.4* 34.8 - 46.6 % Final  . Platelets 02/03/2016 710* 145 - 400 10e3/uL Final  . MCV 02/03/2016 79.4* 79.5 - 101.0 fL Final  . MCH 02/03/2016 24.6* 25.1 - 34.0 pg Final  . MCHC 02/03/2016 31.0* 31.5 - 36.0 g/dL Final  . RBC 02/03/2016 3.45* 3.70 - 5.45 10e6/uL Final  . RDW 02/03/2016 17.2* 11.2 - 14.5 % Final  . lymph# 02/03/2016 2.0  0.9 - 3.3 10e3/uL Final  . MONO# 02/03/2016 0.4  0.1 - 0.9 10e3/uL Final  . Eosinophils Absolute 02/03/2016 0.0  0.0 - 0.5 10e3/uL Final  . Basophils Absolute 02/03/2016 0.0  0.0  - 0.1 10e3/uL Final  . NEUT% 02/03/2016 78.9* 38.4 - 76.8 % Final  . LYMPH% 02/03/2016 17.3  14.0 - 49.7 % Final  . MONO% 02/03/2016 3.2  0.0 - 14.0 % Final  . EOS% 02/03/2016 0.3  0.0 - 7.0 % Final  . BASO% 02/03/2016 0.3  0.0 - 2.0 % Final  . nRBC 02/03/2016 0  0 - 0 % Final  . Sodium 02/03/2016 134* 136 - 145 mEq/L Final  . Potassium 02/03/2016 3.4* 3.5 - 5.1 mEq/L Final  . Chloride 02/03/2016 102  98 - 109 mEq/L Final  . CO2 02/03/2016 24  22 - 29 mEq/L Final  . Glucose 02/03/2016 209* 70 - 140 mg/dl Final  . BUN 02/03/2016 12.2  7.0 - 26.0 mg/dL Final  . Creatinine 02/03/2016 0.8  0.6 - 1.1 mg/dL Final  . Total Bilirubin 02/03/2016 0.44  0.20 - 1.20 mg/dL Final  . Alkaline Phosphatase 02/03/2016 121  40 - 150 U/L Final  . AST 02/03/2016 92* 5 - 34 U/L Final  . ALT 02/03/2016 16  0 - 55 U/L Final  . Total Protein 02/03/2016 7.3  6.4 - 8.3 g/dL Final  . Albumin 02/03/2016 2.1* 3.5 - 5.0 g/dL Final  . Calcium 02/03/2016 9.5  8.4 - 10.4 mg/dL Final  . Anion Gap 02/03/2016 9  3 - 11 mEq/L Final  . EGFR 02/03/2016 >90  >90 ml/min/1.73 m2 Final  Orders Only on 02/01/2016  Component Date Value Ref Range Status  . Occult Blood 02/01/2016 Positive x 1   Final    RADIOGRAPHIC STUDIES: No results found.  ASSESSMENT/PLAN:    Hypokalemia Patient's potassium was down to 3.4 today.  Patient does appear mildly dehydrated today as well.  Patient was encouraged to push potassium in her diet is much as possible.  Dehydration Patient has had decreased oral intake recently; secondary to nausea.  She denies any vomiting recently.  She denies any other new symptoms whatsoever.  She denies any recent fevers or chills.  Vital signs revealed blood pressure initially 122/75 with a heart rate of 125.  After receiving IV fluid rehydration-patient's blood pressure was 118/67 and heart rate had improved down to 108.  Patient's sodium level has actually improved from 130 up to 134 today.  Patient will  receive IV fluid rehydration while at the cancer Center today.  Patient was encouraged to push fluids as much as possible.  She was advised to call/return or go directly to the emergency department she develops any worsening symptoms whatsoever.  Colon cancer [facility]) Patient received cycle one of her FOLFOX chemotherapy regimen on 02/01/2016.  She presented to the [facility] today with complaint of fatigue, and dehydration.  She received IV fluid rehydration while at the cancer Center today.  Labs obtained today reveal a WBC of 11.6, ANC 9.2, hemoglobin remained stable at 8.5, platelet count 710.  See further notes for details of today's visit.  Patient is scheduled to return on 02/15/2016 for labs, visit, and her next cycle of chemotherapy.  Her most recent notes-patient will have Avastin added to her chemotherapy regimen at that time as well.   Patient stated understanding of all instructions; and was in agreement with this plan of care. The patient knows to call the clinic with any problems, questions or concerns.   Total time spent with patient was 25 minutes;  with greater than 75 percent of that time spent in face to face counseling regarding patient's symptoms,  and coordination of care and follow up.  Disclaimer:This dictation was prepared with Dragon/digital dictation along with [employer]. Any transcriptional errors that result from this process are unintentional.  Drue Second, NP 02/06/2016

## 2016-02-06 NOTE — Assessment & Plan Note (Signed)
Patient received cycle one of her FOLFOX chemotherapy regimen on 02/01/2016.  She presented to the [facility] today with complaint of fatigue, and dehydration.  She received IV fluid rehydration while at the cancer Center today.  Labs obtained today reveal a WBC of 11.6, ANC 9.2, hemoglobin remained stable at 8.5, platelet count 710.  See further notes for details of today's visit.  Patient is scheduled to return on 02/15/2016 for labs, visit, and her next cycle of chemotherapy.  Her most recent notes-patient will have Avastin added to her chemotherapy regimen at that time as well.

## 2016-02-07 ENCOUNTER — Telehealth: Payer: Self-pay | Admitting: *Deleted

## 2016-02-07 NOTE — Telephone Encounter (Signed)
TC to patient to follow with her re: nausea, dehydration last week.  Pt did have chemo call back on 02/04/16 and was doing ok.  No answer to call today. No voice mail available to leave patient message.

## 2016-02-08 ENCOUNTER — Encounter (HOSPITAL_COMMUNITY): Payer: Self-pay

## 2016-02-08 ENCOUNTER — Telehealth: Payer: Self-pay

## 2016-02-08 NOTE — Telephone Encounter (Signed)
Pt called to make sure she had lab appt with next visit. Schedule verified.

## 2016-02-11 ENCOUNTER — Encounter: Payer: Self-pay | Admitting: *Deleted

## 2016-02-13 ENCOUNTER — Other Ambulatory Visit: Payer: Self-pay | Admitting: Oncology

## 2016-02-15 ENCOUNTER — Ambulatory Visit (HOSPITAL_BASED_OUTPATIENT_CLINIC_OR_DEPARTMENT_OTHER): Payer: Medicare Other | Admitting: Nurse Practitioner

## 2016-02-15 ENCOUNTER — Other Ambulatory Visit (HOSPITAL_BASED_OUTPATIENT_CLINIC_OR_DEPARTMENT_OTHER): Payer: Medicare Other

## 2016-02-15 ENCOUNTER — Ambulatory Visit (HOSPITAL_BASED_OUTPATIENT_CLINIC_OR_DEPARTMENT_OTHER): Payer: Medicare Other

## 2016-02-15 VITALS — BP 135/82 | HR 100 | Temp 98.4°F | Resp 18 | Ht 70.0 in | Wt 180.4 lb

## 2016-02-15 DIAGNOSIS — C787 Secondary malignant neoplasm of liver and intrahepatic bile duct: Secondary | ICD-10-CM

## 2016-02-15 DIAGNOSIS — C7800 Secondary malignant neoplasm of unspecified lung: Secondary | ICD-10-CM | POA: Diagnosis not present

## 2016-02-15 DIAGNOSIS — Z5111 Encounter for antineoplastic chemotherapy: Secondary | ICD-10-CM | POA: Diagnosis present

## 2016-02-15 DIAGNOSIS — C182 Malignant neoplasm of ascending colon: Secondary | ICD-10-CM

## 2016-02-15 DIAGNOSIS — R195 Other fecal abnormalities: Secondary | ICD-10-CM | POA: Diagnosis not present

## 2016-02-15 LAB — CBC WITH DIFFERENTIAL/PLATELET
BASO%: 1.5 % (ref 0.0–2.0)
Basophils Absolute: 0.1 10*3/uL (ref 0.0–0.1)
EOS ABS: 0.1 10*3/uL (ref 0.0–0.5)
EOS%: 1.5 % (ref 0.0–7.0)
HCT: 32.6 % — ABNORMAL LOW (ref 34.8–46.6)
HEMOGLOBIN: 10.1 g/dL — AB (ref 11.6–15.9)
LYMPH%: 26.6 % (ref 14.0–49.7)
MCH: 24.7 pg — ABNORMAL LOW (ref 25.1–34.0)
MCHC: 30.9 g/dL — ABNORMAL LOW (ref 31.5–36.0)
MCV: 79.9 fL (ref 79.5–101.0)
MONO#: 0.9 10*3/uL (ref 0.1–0.9)
MONO%: 12.2 % (ref 0.0–14.0)
NEUT%: 58.2 % (ref 38.4–76.8)
NEUTROS ABS: 4.5 10*3/uL (ref 1.5–6.5)
Platelets: 368 10*3/uL (ref 145–400)
RBC: 4.08 10*6/uL (ref 3.70–5.45)
RDW: 20 % — AB (ref 11.2–14.5)
WBC: 7.8 10*3/uL (ref 3.9–10.3)
lymph#: 2.1 10*3/uL (ref 0.9–3.3)

## 2016-02-15 LAB — COMPREHENSIVE METABOLIC PANEL
ALBUMIN: 2.7 g/dL — AB (ref 3.5–5.0)
ALK PHOS: 146 U/L (ref 40–150)
ALT: 9 U/L (ref 0–55)
AST: 69 U/L — AB (ref 5–34)
Anion Gap: 11 mEq/L (ref 3–11)
BILIRUBIN TOTAL: 0.43 mg/dL (ref 0.20–1.20)
BUN: 7.4 mg/dL (ref 7.0–26.0)
CO2: 24 mEq/L (ref 22–29)
Calcium: 10.7 mg/dL — ABNORMAL HIGH (ref 8.4–10.4)
Chloride: 107 mEq/L (ref 98–109)
Creatinine: 0.8 mg/dL (ref 0.6–1.1)
EGFR: 79 mL/min/{1.73_m2} — ABNORMAL LOW (ref >90)
GLUCOSE: 182 mg/dL — AB (ref 70–140)
Potassium: 4.3 mEq/L (ref 3.5–5.1)
SODIUM: 142 meq/L (ref 136–145)
TOTAL PROTEIN: 7.9 g/dL (ref 6.4–8.3)

## 2016-02-15 MED ORDER — OXALIPLATIN CHEMO INJECTION 100 MG/20ML
85.0000 mg/m2 | Freq: Once | INTRAVENOUS | Status: AC
  Administered 2016-02-15: 175 mg via INTRAVENOUS
  Filled 2016-02-15: qty 35

## 2016-02-15 MED ORDER — DEXTROSE 5 % IV SOLN
400.0000 mg/m2 | Freq: Once | INTRAVENOUS | Status: AC
  Administered 2016-02-15: 820 mg via INTRAVENOUS
  Filled 2016-02-15: qty 41

## 2016-02-15 MED ORDER — PALONOSETRON HCL INJECTION 0.25 MG/5ML
INTRAVENOUS | Status: AC
  Filled 2016-02-15: qty 5

## 2016-02-15 MED ORDER — PALONOSETRON HCL INJECTION 0.25 MG/5ML
0.2500 mg | Freq: Once | INTRAVENOUS | Status: AC
  Administered 2016-02-15: 0.25 mg via INTRAVENOUS

## 2016-02-15 MED ORDER — DEXTROSE 5 % IV SOLN
Freq: Once | INTRAVENOUS | Status: AC
  Administered 2016-02-15: 14:00:00 via INTRAVENOUS

## 2016-02-15 MED ORDER — FLUOROURACIL CHEMO INJECTION 2.5 GM/50ML
400.0000 mg/m2 | Freq: Once | INTRAVENOUS | Status: AC
  Administered 2016-02-15: 800 mg via INTRAVENOUS
  Filled 2016-02-15: qty 16

## 2016-02-15 MED ORDER — DEXAMETHASONE SODIUM PHOSPHATE 100 MG/10ML IJ SOLN
10.0000 mg | Freq: Once | INTRAMUSCULAR | Status: AC
  Administered 2016-02-15: 10 mg via INTRAVENOUS
  Filled 2016-02-15: qty 1

## 2016-02-15 MED ORDER — SODIUM CHLORIDE 0.9 % IV SOLN
Freq: Once | INTRAVENOUS | Status: AC
  Administered 2016-02-15: 13:00:00 via INTRAVENOUS

## 2016-02-15 MED ORDER — SODIUM CHLORIDE 0.9 % IV SOLN
2400.0000 mg/m2 | INTRAVENOUS | Status: DC
  Administered 2016-02-15: 4900 mg via INTRAVENOUS
  Filled 2016-02-15: qty 98

## 2016-02-15 NOTE — Addendum Note (Signed)
Addended by: Owens Shark on: 02/15/2016 01:07 PM   Modules accepted: Orders

## 2016-02-15 NOTE — Patient Instructions (Signed)
[facility] Discharge Instructions for Patients Receiving Chemotherapy  Today you received the following chemotherapy agents Oxaliplatin, Leucovorin and Flourouracil To help prevent nausea and vomiting after your treatment, we encourage you to take your nausea medication as directed.  Take Compazine for 3 days after chemo if have nausea and/or vomiting instead of Zofran.     If you develop nausea and vomiting that is not controlled by your nausea medication, call the clinic.   BELOW ARE SYMPTOMS THAT SHOULD BE REPORTED IMMEDIATELY:  *FEVER GREATER THAN 100.5 F  *CHILLS WITH OR WITHOUT FEVER  NAUSEA AND VOMITING THAT IS NOT CONTROLLED WITH YOUR NAUSEA MEDICATION  *UNUSUAL SHORTNESS OF BREATH  *UNUSUAL BRUISING OR BLEEDING  TENDERNESS IN MOUTH AND THROAT WITH OR WITHOUT PRESENCE OF ULCERS  *URINARY PROBLEMS  *BOWEL PROBLEMS  UNUSUAL RASH Items with * indicate a potential emergency and should be followed up as soon as possible.  Feel free to call the clinic you have any questions or concerns. The clinic phone number is [phone].  Please show the [place] [facility] at check-in to the Emergency Department and triage nurse.   Oxaliplatin Injection What is this medicine? OXALIPLATIN (ox AL i PLA tin) is a chemotherapy drug. It targets fast dividing cells, like cancer cells, and causes these cells to die. This medicine is used to treat cancers of the colon and rectum, and many other cancers. This medicine may be used for other purposes; ask your health care provider or pharmacist if you have questions. What should I tell my health care provider before I take this medicine? They need to know if you have any of these conditions: -kidney disease -an unusual or allergic reaction to oxaliplatin, other chemotherapy, other medicines, foods, dyes, or preservatives -pregnant or trying to get pregnant -breast-feeding How should I use this medicine? This drug is given as  an infusion into a vein. It is administered in a hospital or clinic by a specially trained health care professional. Talk to your pediatrician regarding the use of this medicine in children. Special care may be needed. Overdosage: If you think you have taken too much of this medicine contact a poison control center or emergency room at once. NOTE: This medicine is only for you. Do not share this medicine with others. What if I miss a dose? It is important not to miss a dose. Call your doctor or health care professional if you are unable to keep an appointment. What may interact with this medicine? -medicines to increase blood counts like filgrastim, pegfilgrastim, sargramostim -probenecid -some antibiotics like amikacin, gentamicin, neomycin, polymyxin B, streptomycin, tobramycin -zalcitabine Talk to your doctor or health care professional before taking any of these medicines: -acetaminophen -aspirin -ibuprofen -ketoprofen -naproxen This list may not describe all possible interactions. Give your health care provider a list of all the medicines, herbs, non-prescription drugs, or dietary supplements you use. Also tell them if you smoke, drink alcohol, or use illegal drugs. Some items may interact with your medicine. What should I watch for while using this medicine? Your condition will be monitored carefully while you are receiving this medicine. You will need important blood work done while you are taking this medicine. This medicine can make you more sensitive to cold. Do not drink cold drinks or use ice. Cover exposed skin before coming in contact with cold temperatures or cold objects. When out in cold weather wear warm clothing and cover your mouth and nose to warm the air that goes  into your lungs. Tell your doctor if you get sensitive to the cold. This drug may make you feel generally unwell. This is not uncommon, as chemotherapy can affect healthy cells as well as cancer cells. Report any  side effects. Continue your course of treatment even though you feel ill unless your doctor tells you to stop. In some cases, you may be given additional medicines to help with side effects. Follow all directions for their use. Call your doctor or health care professional for advice if you get a fever, chills or sore throat, or other symptoms of a cold or flu. Do not treat yourself. This drug decreases your body's ability to fight infections. Try to avoid being around people who are sick. This medicine may increase your risk to bruise or bleed. Call your doctor or health care professional if you notice any unusual bleeding. Be careful brushing and flossing your teeth or using a toothpick because you may get an infection or bleed more easily. If you have any dental work done, tell your dentist you are receiving this medicine. Avoid taking products that contain aspirin, acetaminophen, ibuprofen, naproxen, or ketoprofen unless instructed by your doctor. These medicines may hide a fever. Do not become pregnant while taking this medicine. Women should inform their doctor if they wish to become pregnant or think they might be pregnant. There is a potential for serious side effects to an unborn child. Talk to your health care professional or pharmacist for more information. Do not breast-feed an infant while taking this medicine. Call your doctor or health care professional if you get diarrhea. Do not treat yourself. What side effects may I notice from receiving this medicine? Side effects that you should report to your doctor or health care professional as soon as possible: -allergic reactions like skin rash, itching or hives, swelling of the face, lips, or tongue -low blood counts - This drug may decrease the number of white blood cells, red blood cells and platelets. You may be at increased risk for infections and bleeding. -signs of infection - fever or chills, cough, sore throat, pain or difficulty passing  urine -signs of decreased platelets or bleeding - bruising, pinpoint red spots on the skin, black, tarry stools, nosebleeds -signs of decreased red blood cells - unusually weak or tired, fainting spells, lightheadedness -breathing problems -chest pain, pressure -cough -diarrhea -jaw tightness -mouth sores -nausea and vomiting -pain, swelling, redness or irritation at the injection site -pain, tingling, numbness in the hands or feet -problems with balance, talking, walking -redness, blistering, peeling or loosening of the skin, including inside the mouth -trouble passing urine or change in the amount of urine Side effects that usually do not require medical attention (report to your doctor or health care professional if they continue or are bothersome): -changes in vision -constipation -hair loss -loss of appetite -metallic taste in the mouth or changes in taste -stomach pain This list may not describe all possible side effects. Call your doctor for medical advice about side effects. You may report side effects to FDA at 1-800-FDA-1088. Where should I keep my medicine? This drug is given in a hospital or clinic and will not be stored at home. NOTE: This sheet is a summary. It may not cover all possible information. If you have questions about this medicine, talk to your doctor, pharmacist, or health care provider.    2016, Elsevier/Gold Standard. (2007-12-31 17:22:47)  Leucovorin injection What is this medicine? LEUCOVORIN (loo koe VOR in) is used to  prevent or treat the harmful effects of some medicines. This medicine is used to treat anemia caused by a low amount of folic acid in the body. It is also used with 5-fluorouracil (5-FU) to treat colon cancer. This medicine may be used for other purposes; ask your health care provider or pharmacist if you have questions. What should I tell my health care provider before I take this medicine? They need to know if you have any of these  conditions: -anemia from low levels of vitamin B-12 in the blood -an unusual or allergic reaction to leucovorin, folic acid, other medicines, foods, dyes, or preservatives -pregnant or trying to get pregnant -breast-feeding How should I use this medicine? This medicine is for injection into a muscle or into a vein. It is given by a health care professional in a hospital or clinic setting. Talk to your pediatrician regarding the use of this medicine in children. Special care may be needed. Overdosage: If you think you have taken too much of this medicine contact a poison control center or emergency room at once. NOTE: This medicine is only for you. Do not share this medicine with others. What if I miss a dose? This does not apply. What may interact with this medicine? -capecitabine -fluorouracil -phenobarbital -phenytoin -primidone -trimethoprim-sulfamethoxazole This list may not describe all possible interactions. Give your health care provider a list of all the medicines, herbs, non-prescription drugs, or dietary supplements you use. Also tell them if you smoke, drink alcohol, or use illegal drugs. Some items may interact with your medicine. What should I watch for while using this medicine? Your condition will be monitored carefully while you are receiving this medicine. This medicine may increase the side effects of 5-fluorouracil, 5-FU. Tell your doctor or health care professional if you have diarrhea or mouth sores that do not get better or that get worse. What side effects may I notice from receiving this medicine? Side effects that you should report to your doctor or health care professional as soon as possible: -allergic reactions like skin rash, itching or hives, swelling of the face, lips, or tongue -breathing problems -fever, infection -mouth sores -unusual bleeding or bruising -unusually weak or tired Side effects that usually do not require medical attention (report to  your doctor or health care professional if they continue or are bothersome): -constipation or diarrhea -loss of appetite -nausea, vomiting This list may not describe all possible side effects. Call your doctor for medical advice about side effects. You may report side effects to FDA at 1-800-FDA-1088. Where should I keep my medicine? This drug is given in a hospital or clinic and will not be stored at home. NOTE: This sheet is a summary. It may not cover all possible information. If you have questions about this medicine, talk to your doctor, pharmacist, or health care provider.    2016, Elsevier/Gold Standard. (2007-12-10 16:50:29)  Fluorouracil, 5FU; Diclofenac topical cream What is this medicine? FLUOROURACIL; DICLOFENAC (flure oh YOOR a sil; dye KLOE fen ak) is a combination of a topical chemotherapy agent and non-steroidal anti-inflammatory drug (NSAID). It is used on the skin to treat skin cancer and skin conditions that could become cancer. This medicine may be used for other purposes; ask your health care provider or pharmacist if you have questions. What should I tell my health care provider before I take this medicine? They need to know if you have any of these conditions: -bleeding problems -cigarette smoker -DPD enzyme deficiency -heart disease -high blood  pressure -if you frequently drink alcohol containing drinks -kidney disease -liver disease -open or infected skin -stomach problems -swelling or open sores at the treatment site -recent or planned coronary artery bypass graft (CABG) surgery -an unusual or allergic reaction to fluorouracil, diclofenac, aspirin, other NSAIDs, other medicines, foods, dyes, or preservatives -pregnant or trying to get pregnant -breast-feeding How should I use this medicine? This medicine is only for use on the skin. Follow the directions on the prescription label. Wash hands before and after use. Wash affected area and gently pat dry. To  apply this medicine use a cotton-tipped applicator, or use gloves if applying with fingertips. If applied with unprotected fingertips, it is very important to wash your hands well after you apply this medicine. Avoid applying to the eyes, nose, or mouth. Apply enough medicine to cover the affected area. You can cover the area with a light gauze dressing, but do not use tight or air-tight dressings. Finish the full course prescribed by your doctor or health care professional, even if you think your condition is better. Do not stop taking except on the advice of your doctor or health care professional. Talk to your pediatrician regarding the use of this medicine in children. Special care may be needed. Overdosage: If you think you have taken too much of this medicine contact a poison control center or emergency room at once. NOTE: This medicine is only for you. Do not share this medicine with others. What if I miss a dose? If you miss a dose, apply it as soon as you can. If it is almost time for your next dose, only use that dose. Do not apply extra doses. Contact your doctor or health care professional if you miss more than one dose. What may interact with this medicine? Interactions are not expected. Do not use any other skin products without telling your doctor or health care professional. This list may not describe all possible interactions. Give your health care provider a list of all the medicines, herbs, non-prescription drugs, or dietary supplements you use. Also tell them if you smoke, drink alcohol, or use illegal drugs. Some items may interact with your medicine. What should I watch for while using this medicine? Visit your doctor or health care professional for checks on your progress. You will need to use this medicine for 2 to 6 weeks. This may be longer depending on the condition being treated. You may not see full healing for another 1 to 2 months after you stop using the medicine. Treated  areas of skin can look unsightly during and for several weeks after treatment with this medicine. This medicine can make you more sensitive to the sun. Keep out of the sun. If you cannot avoid being in the sun, wear protective clothing and use sunscreen. Do not use sun lamps or tanning beds/booths. Where should I keep my What side effects may I notice from receiving this medicine? Side effects that you should report to your doctor or health care professional as soon as possible: -allergic reactions like skin rash, itching or hives, swelling of the face, lips, or tongue -black or bloody stools, blood in the urine or vomit -blurred vision -chest pain -difficulty breathing or wheezing -redness, blistering, peeling or loosening of the skin, including inside the mouth -severe redness and swelling of normal skin -slurred speech or weakness on one side of the body -trouble passing urine or change in the amount of urine -unexplained weight gain or swelling -unusually weak or  tired -yellowing of eyes or skin Side effects that usually do not require medical attention (Report these to your doctor or health care professional if they continue or are bothersome.): -increased sensitivity of the skin to sun and ultraviolet light -pain and burning of the affected area -scaling or swelling of the affected area -skin rash, itching of the affected area -tenderness This list may not describe all possible side effects. Call your doctor for medical advice about side effects. You may report side effects to FDA at 1-800-FDA-1088. Where should I keep my medicine? Keep out of the reach of children. Store at room temperature between 20 and 25 degrees C (68 and 77 degrees F). Throw away any unused medicine after the expiration date. NOTE: This sheet is a summary. It may not cover all possible information. If you have questions about this medicine, talk to your doctor, pharmacist, or health care provider.    2016,  Elsevier/Gold Standard. (2013-10-06 11:09:58)

## 2016-02-15 NOTE — Progress Notes (Signed)
Pt not receiving avastin tx today because of positive hemoccult per Ned Card, NP. Pt not complaining of any issues with bleeding. Pharmacy made aware. Pt tolerated tx. AVS printed. VSS and pt asymptomatic post tx.

## 2016-02-15 NOTE — Progress Notes (Addendum)
  [facility] OFFICE PROGRESS NOTE   Diagnosis:  Colon cancer  INTERVAL HISTORY:   Judith Stephens returns as scheduled. She completed cycle 1 FOLFOX 02/01/2016. She received IV fluids for dehydration on 02/03/2016. She reports no significant nausea/vomiting following the chemotherapy. She thinks she became dehydrated by "not drinking enough". No mouth sores. No diarrhea. Cold sensitivity lasted about 1 week. She denies any bleeding.  Objective:  Vital signs in last 24 hours:  Blood pressure 135/82, pulse 100, temperature 98.4 F (36.9 C), temperature source Oral, resp. rate 18, height 5\' 10"  (1.778 m), weight 180 lb 6.4 oz (81.8 kg), SpO2 100 %.    HEENT: No thrush or ulcers. Resp: Lungs clear bilaterally. Cardio: Regular rate and rhythm. GI: Liver palpable right upper abdomen. Vascular: No leg edema. Port-A-Cath without erythema.  Lab Results:  Lab Results  Component Value Date   WBC 7.8 02/15/2016   HGB 10.1 (L) 02/15/2016   HCT 32.6 (L) 02/15/2016   MCV 79.9 02/15/2016   PLT 368 02/15/2016   NEUTROABS 4.5 02/15/2016    Imaging:  No results found.  Medications: I have reviewed the patient's current medications.  Assessment/Plan: 1. 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 She began adjuvant Xeloda on 05/14/2012. Final cycle of Xeloda started on 10/22/2012.  CT abdomen/pelvis 01/10/2016-innumerable hepatic metastases  CT chest 01/12/2016-multiple pulmonary nodules consistent with metastases  Cycle 1 FOLFOX 02/01/2016  Cycle 2 FOLFOX 02/15/2016 2. Severe microcytic anemia likely secondary to iron deficiency related to bleeding from the colon tumor. The hemoglobin and MCV improved on oral iron. Oral iron was discontinued following office visit 12/16/2012.  Recurrent microcytic anemia July 2017  Stool positive for occult blood 02/01/2016  Referred to  GI 3. Diabetes. 4. Hypertension. 5. Hyperlipidemia. 6. History of hand/foot syndrome secondary to Xeloda prompting the Xeloda to be discontinued after day 12, cycle 2. Improved. Xeloda was dose reduced beginning with cycle 3. 7. History of mild diarrhea secondary to Xeloda.  8. History of eye tearing-most likely related to seasonal allergies though this could be a manifestation of Xeloda toxicity.  9. Status post root canal 10/02/2012. 10. Colonoscopy 03/13/2013 by Dr. Amedeo Plenty. Pathology on transverse colon polyp showed a tubular adenoma. Next colonoscopy recommendedat a three-year interval. 11. Anorexia/weight loss 12. Port-A-Cath placement by Dr. Dalbert Batman 01/31/2016   Disposition: Judith Stephens appears stable. She has completed 1 cycle of FOLFOX. Plan to proceed with cycle 2 today as scheduled.  We will hold beginning Avastin due to stool being positive for occult blood. Referral made for GI evaluation.  She will return for a follow-up visit and cycle 3 FOLFOX in 2 weeks. She will contact the office in the interim with any problems.    Ned Card ANP/GNP-BC   02/15/2016  1:06 PM

## 2016-02-17 ENCOUNTER — Ambulatory Visit (HOSPITAL_BASED_OUTPATIENT_CLINIC_OR_DEPARTMENT_OTHER): Payer: Medicare Other

## 2016-02-17 VITALS — BP 115/71 | HR 116 | Temp 99.2°F | Resp 20

## 2016-02-17 DIAGNOSIS — C182 Malignant neoplasm of ascending colon: Secondary | ICD-10-CM

## 2016-02-17 DIAGNOSIS — Z452 Encounter for adjustment and management of vascular access device: Secondary | ICD-10-CM | POA: Diagnosis present

## 2016-02-17 MED ORDER — HEPARIN SOD (PORK) LOCK FLUSH 100 UNIT/ML IV SOLN
500.0000 [IU] | Freq: Once | INTRAVENOUS | Status: AC | PRN
  Administered 2016-02-17: 500 [IU]
  Filled 2016-02-17: qty 5

## 2016-02-17 MED ORDER — SODIUM CHLORIDE 0.9% FLUSH
10.0000 mL | INTRAVENOUS | Status: DC | PRN
  Administered 2016-02-17: 10 mL
  Filled 2016-02-17: qty 10

## 2016-02-17 NOTE — Progress Notes (Signed)
Patient feeling about the same as she did last tx.  She was nauseated last night.  She is starting to feel better today.  Reviewed nausea medications with patient and husband.  Let her know that the evening before she has her pump removed, (this is when she starts to feel nauseated), she can go ahead and take a compazine to prevent the nausea.  She can start using zofran for nausea tomorrow.  She verbalized importance of staying hydrated and she knows to call if she does not continue to feel better.  Pulse has come down some since she first sat down - but she looks like she normally has some tachycardia.

## 2016-02-22 ENCOUNTER — Telehealth: Payer: Self-pay | Admitting: *Deleted

## 2016-02-22 NOTE — Telephone Encounter (Signed)
Pt called on call service on 02/19/16 with complaints of constipation.  Call placed to check on patient's status.  Pt states that constipation is relieved after stopping iron pill and taking Miralax and Colace per on call instructions.  Pt appreciative of call and has no questions or concerns at this time.

## 2016-02-24 ENCOUNTER — Other Ambulatory Visit: Payer: Self-pay | Admitting: *Deleted

## 2016-02-24 MED ORDER — MONTELUKAST SODIUM 10 MG PO TABS: 10.0000 mg | ORAL_TABLET | Freq: Every evening | ORAL | 1 refills | Status: DC

## 2016-02-24 MED ORDER — PROCHLORPERAZINE MALEATE 5 MG PO TABS: 5.0000 mg | ORAL_TABLET | Freq: Four times a day (QID) | ORAL | 1 refills | Status: DC | PRN

## 2016-02-24 NOTE — Telephone Encounter (Signed)
Call received from patient requesting refill on Singulair and Compazine.  OK for refills per Dr. Benay Spice.  Pt notified.

## 2016-02-27 ENCOUNTER — Other Ambulatory Visit: Payer: Self-pay | Admitting: Oncology

## 2016-02-29 ENCOUNTER — Ambulatory Visit (HOSPITAL_BASED_OUTPATIENT_CLINIC_OR_DEPARTMENT_OTHER): Payer: Medicare Other

## 2016-02-29 ENCOUNTER — Ambulatory Visit (HOSPITAL_BASED_OUTPATIENT_CLINIC_OR_DEPARTMENT_OTHER): Payer: Medicare Other | Admitting: Oncology

## 2016-02-29 ENCOUNTER — Ambulatory Visit: Payer: Medicare Other | Admitting: Nutrition

## 2016-02-29 VITALS — BP 130/84 | HR 113 | Temp 97.9°F | Resp 18 | Ht 70.0 in | Wt 174.8 lb

## 2016-02-29 DIAGNOSIS — C787 Secondary malignant neoplasm of liver and intrahepatic bile duct: Secondary | ICD-10-CM | POA: Diagnosis not present

## 2016-02-29 DIAGNOSIS — Z5111 Encounter for antineoplastic chemotherapy: Secondary | ICD-10-CM

## 2016-02-29 DIAGNOSIS — C182 Malignant neoplasm of ascending colon: Secondary | ICD-10-CM

## 2016-02-29 DIAGNOSIS — C7800 Secondary malignant neoplasm of unspecified lung: Secondary | ICD-10-CM

## 2016-02-29 LAB — COMPREHENSIVE METABOLIC PANEL
ALT: 10 U/L (ref 0–55)
AST: 66 U/L — AB (ref 5–34)
Albumin: 2.8 g/dL — ABNORMAL LOW (ref 3.5–5.0)
Alkaline Phosphatase: 128 U/L (ref 40–150)
Anion Gap: 11 mEq/L (ref 3–11)
BUN: 6.8 mg/dL — AB (ref 7.0–26.0)
CALCIUM: 10.5 mg/dL — AB (ref 8.4–10.4)
CHLORIDE: 106 meq/L (ref 98–109)
CO2: 25 meq/L (ref 22–29)
CREATININE: 1 mg/dL (ref 0.6–1.1)
EGFR: 68 mL/min/{1.73_m2} — ABNORMAL LOW (ref >90)
GLUCOSE: 169 mg/dL — AB (ref 70–140)
POTASSIUM: 3.8 meq/L (ref 3.5–5.1)
SODIUM: 142 meq/L (ref 136–145)
Total Bilirubin: 0.45 mg/dL (ref 0.20–1.20)
Total Protein: 8.1 g/dL (ref 6.4–8.3)

## 2016-02-29 LAB — CBC WITH DIFFERENTIAL/PLATELET
BASO%: 1.9 % (ref 0.0–2.0)
BASOS ABS: 0.1 10*3/uL (ref 0.0–0.1)
EOS%: 0.2 % (ref 0.0–7.0)
Eosinophils Absolute: 0 10*3/uL (ref 0.0–0.5)
HCT: 34 % — ABNORMAL LOW (ref 34.8–46.6)
HGB: 10.6 g/dL — ABNORMAL LOW (ref 11.6–15.9)
LYMPH#: 2.2 10*3/uL (ref 0.9–3.3)
LYMPH%: 30 % (ref 14.0–49.7)
MCH: 24.9 pg — AB (ref 25.1–34.0)
MCHC: 31.2 g/dL — AB (ref 31.5–36.0)
MCV: 79.9 fL (ref 79.5–101.0)
MONO#: 0.8 10*3/uL (ref 0.1–0.9)
MONO%: 11.3 % (ref 0.0–14.0)
NEUT#: 4.2 10*3/uL (ref 1.5–6.5)
NEUT%: 56.6 % (ref 38.4–76.8)
Platelets: 447 10*3/uL — ABNORMAL HIGH (ref 145–400)
RBC: 4.25 10*6/uL (ref 3.70–5.45)
RDW: 20.2 % — ABNORMAL HIGH (ref 11.2–14.5)
WBC: 7.4 10*3/uL (ref 3.9–10.3)

## 2016-02-29 LAB — CEA (IN HOUSE-CHCC): CEA (CHCC-In House): 76.37 ng/mL — ABNORMAL HIGH (ref 0.00–5.00)

## 2016-02-29 MED ORDER — DEXTROSE 5 % IV SOLN
Freq: Once | INTRAVENOUS | Status: AC
  Administered 2016-02-29: 12:00:00 via INTRAVENOUS

## 2016-02-29 MED ORDER — SODIUM CHLORIDE 0.9 % IV SOLN
10.0000 mg | Freq: Once | INTRAVENOUS | Status: AC
  Administered 2016-02-29: 10 mg via INTRAVENOUS
  Filled 2016-02-29: qty 1

## 2016-02-29 MED ORDER — FLUOROURACIL CHEMO INJECTION 2.5 GM/50ML
400.0000 mg/m2 | Freq: Once | INTRAVENOUS | Status: AC
  Administered 2016-02-29: 800 mg via INTRAVENOUS
  Filled 2016-02-29: qty 16

## 2016-02-29 MED ORDER — PALONOSETRON HCL INJECTION 0.25 MG/5ML
0.2500 mg | Freq: Once | INTRAVENOUS | Status: AC
  Administered 2016-02-29: 0.25 mg via INTRAVENOUS

## 2016-02-29 MED ORDER — SODIUM CHLORIDE 0.9 % IV SOLN
2400.0000 mg/m2 | INTRAVENOUS | Status: DC
  Administered 2016-02-29: 4750 mg via INTRAVENOUS
  Filled 2016-02-29: qty 95

## 2016-02-29 MED ORDER — PALONOSETRON HCL INJECTION 0.25 MG/5ML
INTRAVENOUS | Status: AC
  Filled 2016-02-29: qty 5

## 2016-02-29 MED ORDER — DEXTROSE 5 % IV SOLN
85.0000 mg/m2 | Freq: Once | INTRAVENOUS | Status: AC
  Administered 2016-02-29: 170 mg via INTRAVENOUS
  Filled 2016-02-29: qty 34

## 2016-02-29 MED ORDER — LEUCOVORIN CALCIUM INJECTION 350 MG
400.0000 mg/m2 | Freq: Once | INTRAVENOUS | Status: AC
  Administered 2016-02-29: 792 mg via INTRAVENOUS
  Filled 2016-02-29: qty 39.6

## 2016-02-29 NOTE — Progress Notes (Signed)
Brief nutrition follow-up completed with patient in the chemotherapy room. She continues to force herself to eat but feels her oral intake has improved. She appears to be eating more salads and other low calorie foods that she enjoys.  Current weight documented as 174.8 pounds on September 12, down from 186 pounds August 15.  Nutrition diagnosis: Malnutrition continues.  Intervention: Provided continue support and encouragement for patient to continue high-calorie high-protein foods. Recommended increase oral nutrition supplements to twice a day or 3 times daily. Teach back method used.  Monitoring, evaluation, goals: Patient will work to increase oral intake to promote weight maintenance.  Next visit: Scheduled as needed.  **Disclaimer: This note was dictated with voice recognition software. Similar sounding words can inadvertently be transcribed and this note may contain transcription errors which may not have been corrected upon publication of note.**

## 2016-02-29 NOTE — Progress Notes (Signed)
  [place] OFFICE PROGRESS NOTE   Diagnosis: Colon cancer  INTERVAL HISTORY:   Ms. Prisby returns as scheduled. She completed cycle 2 FOLFOX on 02/15/2016. No nausea/vomiting, mouth sores, or diarrhea following chemotherapy. No neuropathy symptoms. She felt weak" dizzy "on the evening of day 2. She reports a good appetite. She reports mild tenderness of the palms and soles  Objective:  Vital signs in last 24 hours:regular rate and rhythm  Blood pressure 130/84, pulse (!) 113, temperature 97.9 F (36.6 C), temperature source Oral, resp. rate 18, height 5\' 10"  (1.778 m), weight 174 lb 12.8 oz (79.3 kg), SpO2 100 %.    HEENT: No thrush or ulcers Resp: Lungs clear bilaterally Cardio: Regular rate and rhythm GI: The liver is palpable in the right upper abdomen Vascular: No leg edema  Skin: Confluence hyperpigmentation beneath both breasts, hyperpigmentation of the hands, dry desquamation at the soles   Portacath/PICC-without erythema   Lab Results:  Lab Results  Component Value Date   WBC 7.4 02/29/2016   HGB 10.6 (L) 02/29/2016   HCT 34.0 (L) 02/29/2016   MCV 79.9 02/29/2016   PLT 447 (H) 02/29/2016   NEUTROABS 4.2 02/29/2016     Lab Results  Component Value Date   CEA1 87.6 (H) 01/25/2016     Medications: I have reviewed the patient's current medications.  Assessment/Plan: 1. 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 She began adjuvant Xeloda on 05/14/2012. Final cycle of Xeloda started on 10/22/2012.  CT abdomen/pelvis 01/10/2016-innumerable hepatic metastases  CT chest 01/12/2016-multiple pulmonary nodules consistent with metastases  Cycle 1 FOLFOX 02/01/2016  Cycle 2 FOLFOX 02/15/2016  Cycle 3 FOLFOX 02/29/2016 2. Severe microcytic anemia likely secondary to iron deficiency related to bleeding from the colon tumor. The hemoglobin and MCV improved on oral iron.  Oral iron was discontinued following office visit 12/16/2012.  Recurrent microcytic anemia July 2017  Stool positive for occult blood 02/01/2016  Referred to GI 3. Diabetes. 4. Hypertension. 5. Hyperlipidemia. 6. History of hand/foot syndrome secondary to Xeloda prompting the Xeloda to be discontinued after day 12, cycle 2. Improved. Xeloda was dose reduced beginning with cycle 3. 7. History of mild diarrhea secondary to Xeloda.  8. History of eye tearing-most likely related to seasonal allergies though this could be a manifestation of Xeloda toxicity.  9. Status post root canal 10/02/2012. 10. Colonoscopy 03/13/2013 by Dr. Amedeo Plenty. Pathology on transverse colon polyp showed a tubular adenoma. Next colonoscopy recommendedat a three-year interval. 11. Anorexia/weight loss 12. Port-A-Cath placement by Dr. Dalbert Batman 01/31/2016 13. 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 Borsa has completed 2 cycles of FOLFOX. Her performance status has improved. We placed Avastin on hold secondary to a Hemoccult positive stool. She has been referred to gastroenterology.  I discussed the role for Avastin with Ms. Tines today. She does not wish to receive Avastin at present.  The plan is to proceed with cycle 3 FOLFOX today. She will return for an office visit and cycle 4 in 2 weeks. We will follow-up on the CEA from today.  Betsy Coder, MD  02/29/2016  10:10 AM

## 2016-03-01 LAB — CEA: CEA: 74.6 ng/mL — ABNORMAL HIGH (ref 0.0–4.7)

## 2016-03-02 ENCOUNTER — Ambulatory Visit (HOSPITAL_BASED_OUTPATIENT_CLINIC_OR_DEPARTMENT_OTHER): Payer: Medicare Other

## 2016-03-02 VITALS — BP 111/67 | HR 115 | Temp 99.2°F | Resp 18

## 2016-03-02 DIAGNOSIS — C182 Malignant neoplasm of ascending colon: Secondary | ICD-10-CM | POA: Diagnosis not present

## 2016-03-02 DIAGNOSIS — Z452 Encounter for adjustment and management of vascular access device: Secondary | ICD-10-CM | POA: Diagnosis present

## 2016-03-02 MED ORDER — HEPARIN SOD (PORK) LOCK FLUSH 100 UNIT/ML IV SOLN
500.0000 [IU] | Freq: Once | INTRAVENOUS | Status: AC | PRN
  Administered 2016-03-02: 500 [IU]
  Filled 2016-03-02: qty 5

## 2016-03-02 MED ORDER — SODIUM CHLORIDE 0.9% FLUSH
10.0000 mL | INTRAVENOUS | Status: DC | PRN
  Administered 2016-03-02: 10 mL
  Filled 2016-03-02: qty 10

## 2016-03-03 ENCOUNTER — Telehealth: Payer: Self-pay | Admitting: Nurse Practitioner

## 2016-03-03 ENCOUNTER — Ambulatory Visit: Payer: Medicare Other | Admitting: Podiatry

## 2016-03-03 NOTE — Telephone Encounter (Signed)
Appointments conf with patient, per 0/29/17 los.

## 2016-03-07 ENCOUNTER — Other Ambulatory Visit: Payer: Medicare Other

## 2016-03-07 ENCOUNTER — Ambulatory Visit: Payer: Medicare Other | Admitting: Oncology

## 2016-03-12 ENCOUNTER — Other Ambulatory Visit: Payer: Self-pay | Admitting: Oncology

## 2016-03-14 ENCOUNTER — Telehealth: Payer: Self-pay | Admitting: Oncology

## 2016-03-14 ENCOUNTER — Ambulatory Visit (HOSPITAL_BASED_OUTPATIENT_CLINIC_OR_DEPARTMENT_OTHER): Payer: Medicare Other | Admitting: Nurse Practitioner

## 2016-03-14 ENCOUNTER — Ambulatory Visit (HOSPITAL_BASED_OUTPATIENT_CLINIC_OR_DEPARTMENT_OTHER): Payer: Medicare Other

## 2016-03-14 ENCOUNTER — Other Ambulatory Visit (HOSPITAL_BASED_OUTPATIENT_CLINIC_OR_DEPARTMENT_OTHER): Payer: Medicare Other

## 2016-03-14 ENCOUNTER — Encounter: Payer: Self-pay | Admitting: *Deleted

## 2016-03-14 ENCOUNTER — Ambulatory Visit: Payer: Medicare Other

## 2016-03-14 VITALS — BP 119/70 | HR 112 | Temp 98.5°F | Resp 18 | Ht 70.0 in | Wt 176.0 lb

## 2016-03-14 DIAGNOSIS — C7800 Secondary malignant neoplasm of unspecified lung: Secondary | ICD-10-CM | POA: Diagnosis not present

## 2016-03-14 DIAGNOSIS — C182 Malignant neoplasm of ascending colon: Secondary | ICD-10-CM

## 2016-03-14 DIAGNOSIS — C787 Secondary malignant neoplasm of liver and intrahepatic bile duct: Secondary | ICD-10-CM

## 2016-03-14 DIAGNOSIS — Z95828 Presence of other vascular implants and grafts: Secondary | ICD-10-CM | POA: Insufficient documentation

## 2016-03-14 DIAGNOSIS — Z5112 Encounter for antineoplastic immunotherapy: Secondary | ICD-10-CM | POA: Diagnosis present

## 2016-03-14 LAB — COMPREHENSIVE METABOLIC PANEL
ALT: 10 U/L (ref 0–55)
ANION GAP: 10 meq/L (ref 3–11)
AST: 77 U/L — AB (ref 5–34)
Albumin: 2.6 g/dL — ABNORMAL LOW (ref 3.5–5.0)
Alkaline Phosphatase: 127 U/L (ref 40–150)
BILIRUBIN TOTAL: 0.38 mg/dL (ref 0.20–1.20)
BUN: 9.9 mg/dL (ref 7.0–26.0)
CALCIUM: 10.4 mg/dL (ref 8.4–10.4)
CHLORIDE: 106 meq/L (ref 98–109)
CO2: 25 meq/L (ref 22–29)
CREATININE: 0.9 mg/dL (ref 0.6–1.1)
EGFR: 73 mL/min/{1.73_m2} — ABNORMAL LOW (ref >90)
Glucose: 167 mg/dl — ABNORMAL HIGH (ref 70–140)
Potassium: 4.1 mEq/L (ref 3.5–5.1)
Sodium: 141 mEq/L (ref 136–145)
TOTAL PROTEIN: 7.7 g/dL (ref 6.4–8.3)

## 2016-03-14 LAB — CBC WITH DIFFERENTIAL/PLATELET
BASO%: 0.8 % (ref 0.0–2.0)
Basophils Absolute: 0.1 10*3/uL (ref 0.0–0.1)
EOS ABS: 0 10*3/uL (ref 0.0–0.5)
EOS%: 0.2 % (ref 0.0–7.0)
HEMATOCRIT: 33 % — AB (ref 34.8–46.6)
HGB: 10.2 g/dL — ABNORMAL LOW (ref 11.6–15.9)
LYMPH#: 2.4 10*3/uL (ref 0.9–3.3)
LYMPH%: 28.2 % (ref 14.0–49.7)
MCH: 25.1 pg (ref 25.1–34.0)
MCHC: 30.9 g/dL — AB (ref 31.5–36.0)
MCV: 81.1 fL (ref 79.5–101.0)
MONO#: 1.1 10*3/uL — AB (ref 0.1–0.9)
MONO%: 13.6 % (ref 0.0–14.0)
NEUT%: 57.2 % (ref 38.4–76.8)
NEUTROS ABS: 4.8 10*3/uL (ref 1.5–6.5)
NRBC: 0 % (ref 0–0)
PLATELETS: 243 10*3/uL (ref 145–400)
RBC: 4.07 10*6/uL (ref 3.70–5.45)
RDW: 19.9 % — AB (ref 11.2–14.5)
WBC: 8.3 10*3/uL (ref 3.9–10.3)

## 2016-03-14 MED ORDER — SODIUM CHLORIDE 0.9 % IJ SOLN
10.0000 mL | INTRAMUSCULAR | Status: DC | PRN
  Administered 2016-03-14: 10 mL via INTRAVENOUS
  Filled 2016-03-14: qty 10

## 2016-03-14 MED ORDER — LEUCOVORIN CALCIUM INJECTION 350 MG
400.0000 mg/m2 | Freq: Once | INTRAVENOUS | Status: AC
  Administered 2016-03-14: 792 mg via INTRAVENOUS
  Filled 2016-03-14: qty 39.6

## 2016-03-14 MED ORDER — DEXTROSE 5 % IV SOLN
Freq: Once | INTRAVENOUS | Status: AC
  Administered 2016-03-14: 11:00:00 via INTRAVENOUS

## 2016-03-14 MED ORDER — FLUOROURACIL CHEMO INJECTION 2.5 GM/50ML
400.0000 mg/m2 | Freq: Once | INTRAVENOUS | Status: AC
  Administered 2016-03-14: 800 mg via INTRAVENOUS
  Filled 2016-03-14: qty 16

## 2016-03-14 MED ORDER — HEPARIN SOD (PORK) LOCK FLUSH 100 UNIT/ML IV SOLN
500.0000 [IU] | Freq: Once | INTRAVENOUS | Status: DC | PRN
  Filled 2016-03-14: qty 5

## 2016-03-14 MED ORDER — SODIUM CHLORIDE 0.9% FLUSH
10.0000 mL | INTRAVENOUS | Status: DC | PRN
  Filled 2016-03-14: qty 10

## 2016-03-14 MED ORDER — OXALIPLATIN CHEMO INJECTION 100 MG/20ML
85.0000 mg/m2 | Freq: Once | INTRAVENOUS | Status: AC
  Administered 2016-03-14: 170 mg via INTRAVENOUS
  Filled 2016-03-14: qty 34

## 2016-03-14 MED ORDER — SODIUM CHLORIDE 0.9 % IV SOLN
2400.0000 mg/m2 | INTRAVENOUS | Status: DC
  Administered 2016-03-14: 4750 mg via INTRAVENOUS
  Filled 2016-03-14: qty 95

## 2016-03-14 MED ORDER — PALONOSETRON HCL INJECTION 0.25 MG/5ML
0.2500 mg | Freq: Once | INTRAVENOUS | Status: AC
  Administered 2016-03-14: 0.25 mg via INTRAVENOUS

## 2016-03-14 MED ORDER — SODIUM CHLORIDE 0.9 % IV SOLN
10.0000 mg | Freq: Once | INTRAVENOUS | Status: AC
  Administered 2016-03-14: 10 mg via INTRAVENOUS
  Filled 2016-03-14: qty 1

## 2016-03-14 NOTE — Patient Instructions (Signed)
[facility] Discharge Instructions for Patients Receiving Chemotherapy  Today you received the following chemotherapy agents Oxaliplatin/Leucovorin/Fluorouracil.  To help prevent nausea and vomiting after your treatment, we encourage you to take your nausea medication as directed.   If you develop nausea and vomiting that is not controlled by your nausea medication, call the clinic.   BELOW ARE SYMPTOMS THAT SHOULD BE REPORTED IMMEDIATELY:  *FEVER GREATER THAN 100.5 F  *CHILLS WITH OR WITHOUT FEVER  NAUSEA AND VOMITING THAT IS NOT CONTROLLED WITH YOUR NAUSEA MEDICATION  *UNUSUAL SHORTNESS OF BREATH  *UNUSUAL BRUISING OR BLEEDING  TENDERNESS IN MOUTH AND THROAT WITH OR WITHOUT PRESENCE OF ULCERS  *URINARY PROBLEMS  *BOWEL PROBLEMS  UNUSUAL RASH Items with * indicate a potential emergency and should be followed up as soon as possible.  Feel free to call the clinic you have any questions or concerns. The clinic phone number is [phone].  Please show the CHEMO ALERT CARD at check-in to the Emergency Department and triage nurse.    

## 2016-03-14 NOTE — Progress Notes (Signed)
Oncology Nurse Navigator Documentation  Oncology Nurse Navigator Flowsheets 03/14/2016  Navigator Location [facility]  Navigator Encounter Type Treatment  Telephone -  Treatment Initiated Date -  Patient Visit Type MedOnc  Treatment Phase Active Tx--FOFOX #6  Barriers/Navigation Needs No barriers at this time;No Questions;No Needs  Education -  Interventions -  Coordination of Care -  Education Method -  Support Groups/Services GI Support Group;Other--Living with Cancer group  Acuity -  Time Spent with Patient 15  Feels well except for couple days after chemo--weak and dizzy. Eating well and has gained 2 lb.

## 2016-03-14 NOTE — Progress Notes (Addendum)
  [place] OFFICE PROGRESS NOTE   Diagnosis:  Colon cancer  INTERVAL HISTORY:   Judith Stephens returns as scheduled. She completed cycle 3 FOLFOX 02/29/2016. She denies nausea/vomiting. No mouth sores. No diarrhea. She resumes cold after about 1 week. No persistent neuropathy symptoms. She denies any bleeding. The evening of the pump discontinuation she generally "feels bad" and has some dizziness.  Objective:  Vital signs in last 24 hours:  Blood pressure 119/70, pulse (!) 112, temperature 98.5 F (36.9 C), temperature source Oral, resp. rate 18, height 5\' 10"  (1.778 m), weight 176 lb (79.8 kg), SpO2 100 %.    HEENT: No thrush or ulcers. Resp: Lungs clear bilaterally. Cardio: Regular rate and rhythm. GI: Liver is palpable right upper abdomen. Vascular: No leg edema. Neuro: Vibratory sense intact over the fingertips per tuning fork exam.  Skin: Palms with hyperpigmentation.  Port-A-Cath without erythema.  Lab Results:  Lab Results  Component Value Date   WBC 8.3 03/14/2016   HGB 10.2 (L) 03/14/2016   HCT 33.0 (L) 03/14/2016   MCV 81.1 03/14/2016   PLT 243 03/14/2016   NEUTROABS 4.8 03/14/2016    Imaging:  No results found.  Medications: I have reviewed the patient's current medications.  Assessment/Plan: 1. 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 She began adjuvant Xeloda on 05/14/2012. Final cycle of Xeloda started on 10/22/2012.  CT abdomen/pelvis 01/10/2016-innumerable hepatic metastases  CT chest 01/12/2016-multiple pulmonary nodules consistent with metastases  Cycle 1 FOLFOX 02/01/2016  Cycle 2 FOLFOX 02/15/2016  Cycle 3 FOLFOX 02/29/2016  Cycle 4 FOLFOX 03/14/2016 2. Severe microcytic anemia likely secondary to iron deficiency related to bleeding from the colon tumor. The hemoglobin and MCV improved on oral iron. Oral iron was discontinued following office  visit 12/16/2012.  Recurrent microcytic anemia July 2017  Stool positive for occult blood 02/01/2016  Referred to GI 3. Diabetes. 4. Hypertension. 5. Hyperlipidemia. 6. History of hand/foot syndrome secondary to Xeloda prompting the Xeloda to be discontinued after day 12, cycle 2. Improved. Xeloda was dose reduced beginning with cycle 3. 7. History of mild diarrhea secondary to Xeloda.  8. History of eye tearing-most likely related to seasonal allergies though this could be a manifestation of Xeloda toxicity.  9. Status post root canal 10/02/2012. 10. Colonoscopy 03/13/2013 by Dr. Amedeo Plenty. Pathology on transverse colon polyp showed a tubular adenoma. Next colonoscopy recommended at a three-year interval. 11. Anorexia/weight loss 12. Port-A-Cath placement by Dr. Dalbert Batman 01/31/2016 13. 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 Her performance status continues to be improved. She has completed 3 cycles of FOLFOX. Plan to proceed with cycle 4 today as scheduled. She will return for a follow-up visit and cycle 5 in 2 weeks. The plan is for restaging CT scans after completion of 5 cycles.  She continues to decline Avastin.  Plan reviewed with Dr. Benay Spice.  Ned Card ANP/GNP-BC   03/14/2016  10:36 AM

## 2016-03-14 NOTE — Telephone Encounter (Signed)
Gv pt appts for 10/31 and contrast for ct. Advised radiology will call to make ct.

## 2016-03-16 ENCOUNTER — Encounter (HOSPITAL_COMMUNITY): Payer: Self-pay

## 2016-03-16 ENCOUNTER — Emergency Department (HOSPITAL_COMMUNITY): Payer: Medicare Other

## 2016-03-16 ENCOUNTER — Other Ambulatory Visit: Payer: Self-pay

## 2016-03-16 ENCOUNTER — Ambulatory Visit (HOSPITAL_BASED_OUTPATIENT_CLINIC_OR_DEPARTMENT_OTHER): Payer: Medicare Other

## 2016-03-16 ENCOUNTER — Ambulatory Visit (HOSPITAL_BASED_OUTPATIENT_CLINIC_OR_DEPARTMENT_OTHER): Payer: Medicare Other | Admitting: Oncology

## 2016-03-16 ENCOUNTER — Inpatient Hospital Stay (HOSPITAL_COMMUNITY)
Admission: EM | Admit: 2016-03-16 | Discharge: 2016-03-19 | DRG: 312 | Disposition: A | Discharge status: Home / Self Care | Payer: Medicare Other | Attending: Internal Medicine | Admitting: Internal Medicine

## 2016-03-16 ENCOUNTER — Telehealth: Payer: Self-pay | Admitting: Oncology

## 2016-03-16 ENCOUNTER — Ambulatory Visit (HOSPITAL_COMMUNITY)
Admission: RE | Admit: 2016-03-16 | Discharge: 2016-03-16 | Disposition: A | Discharge status: Home / Self Care | Payer: Medicare Other | Source: Ambulatory Visit | Attending: Oncology | Admitting: Oncology

## 2016-03-16 VITALS — BP 124/68 | HR 140 | Temp 99.9°F

## 2016-03-16 VITALS — BP 130/68 | HR 127 | Temp 100.3°F | Resp 20

## 2016-03-16 DIAGNOSIS — C787 Secondary malignant neoplasm of liver and intrahepatic bile duct: Secondary | ICD-10-CM | POA: Diagnosis present

## 2016-03-16 DIAGNOSIS — R55 Syncope and collapse: Secondary | ICD-10-CM | POA: Diagnosis present

## 2016-03-16 DIAGNOSIS — C7800 Secondary malignant neoplasm of unspecified lung: Secondary | ICD-10-CM | POA: Diagnosis not present

## 2016-03-16 DIAGNOSIS — E785 Hyperlipidemia, unspecified: Secondary | ICD-10-CM | POA: Diagnosis present

## 2016-03-16 DIAGNOSIS — E43 Unspecified severe protein-calorie malnutrition: Secondary | ICD-10-CM | POA: Diagnosis not present

## 2016-03-16 DIAGNOSIS — E871 Hypo-osmolality and hyponatremia: Secondary | ICD-10-CM | POA: Diagnosis present

## 2016-03-16 DIAGNOSIS — C182 Malignant neoplasm of ascending colon: Secondary | ICD-10-CM | POA: Diagnosis not present

## 2016-03-16 DIAGNOSIS — R509 Fever, unspecified: Secondary | ICD-10-CM | POA: Diagnosis present

## 2016-03-16 DIAGNOSIS — C189 Malignant neoplasm of colon, unspecified: Secondary | ICD-10-CM | POA: Diagnosis present

## 2016-03-16 DIAGNOSIS — E876 Hypokalemia: Secondary | ICD-10-CM | POA: Diagnosis not present

## 2016-03-16 DIAGNOSIS — E119 Type 2 diabetes mellitus without complications: Secondary | ICD-10-CM | POA: Diagnosis present

## 2016-03-16 DIAGNOSIS — I10 Essential (primary) hypertension: Secondary | ICD-10-CM | POA: Diagnosis not present

## 2016-03-16 DIAGNOSIS — R918 Other nonspecific abnormal finding of lung field: Secondary | ICD-10-CM | POA: Diagnosis not present

## 2016-03-16 DIAGNOSIS — D649 Anemia, unspecified: Secondary | ICD-10-CM | POA: Diagnosis present

## 2016-03-16 DIAGNOSIS — R6510 Systemic inflammatory response syndrome (SIRS) of non-infectious origin without acute organ dysfunction: Secondary | ICD-10-CM

## 2016-03-16 DIAGNOSIS — Z87891 Personal history of nicotine dependence: Secondary | ICD-10-CM

## 2016-03-16 DIAGNOSIS — R000 Tachycardia, unspecified: Secondary | ICD-10-CM | POA: Diagnosis present

## 2016-03-16 DIAGNOSIS — Z7982 Long term (current) use of aspirin: Secondary | ICD-10-CM

## 2016-03-16 DIAGNOSIS — I951 Orthostatic hypotension: Principal | ICD-10-CM | POA: Diagnosis present

## 2016-03-16 DIAGNOSIS — A419 Sepsis, unspecified organism: Secondary | ICD-10-CM | POA: Diagnosis present

## 2016-03-16 DIAGNOSIS — Z79899 Other long term (current) drug therapy: Secondary | ICD-10-CM

## 2016-03-16 DIAGNOSIS — Z7984 Long term (current) use of oral hypoglycemic drugs: Secondary | ICD-10-CM

## 2016-03-16 DIAGNOSIS — Z9049 Acquired absence of other specified parts of digestive tract: Secondary | ICD-10-CM

## 2016-03-16 LAB — LACTIC ACID, PLASMA
LACTIC ACID, VENOUS: 3.8 mmol/L — AB (ref 0.5–1.9)
LACTIC ACID, VENOUS: 3.6 mmol/L — AB (ref 0.5–1.9)

## 2016-03-16 LAB — BASIC METABOLIC PANEL
Anion gap: 12 (ref 5–15)
BUN: 14 mg/dL (ref 6–20)
CO2: 21 mmol/L — ABNORMAL LOW (ref 22–32)
Calcium: 9.7 mg/dL (ref 8.9–10.3)
Chloride: 101 mmol/L (ref 101–111)
Creatinine, Ser: 0.87 mg/dL (ref 0.44–1.00)
GFR calc Af Amer: >60 mL/min (ref >60)
GFR calc non Af Amer: >60 mL/min (ref >60)
Glucose, Bld: 173 mg/dL — ABNORMAL HIGH (ref 65–99)
Potassium: 3.3 mmol/L — ABNORMAL LOW (ref 3.5–5.1)
Sodium: 134 mmol/L — ABNORMAL LOW (ref 135–145)

## 2016-03-16 LAB — CBC
HCT: 33.3 % — ABNORMAL LOW (ref 36.0–46.0)
Hemoglobin: 10.2 g/dL — ABNORMAL LOW (ref 12.0–15.0)
MCH: 25.1 pg — ABNORMAL LOW (ref 26.0–34.0)
MCHC: 30.6 g/dL (ref 30.0–36.0)
MCV: 81.8 fL (ref 78.0–100.0)
Platelets: 239 10*3/uL (ref 150–400)
RBC: 4.07 MIL/uL (ref 3.87–5.11)
RDW: 19.8 % — ABNORMAL HIGH (ref 11.5–15.5)
WBC: 11.1 10*3/uL — ABNORMAL HIGH (ref 4.0–10.5)

## 2016-03-16 LAB — URINALYSIS, ROUTINE W REFLEX MICROSCOPIC
Bilirubin Urine: NEGATIVE
Glucose, UA: NEGATIVE mg/dL
Ketones, ur: NEGATIVE mg/dL
Leukocytes, UA: NEGATIVE
Nitrite: NEGATIVE
Protein, ur: 100 mg/dL — AB
Specific Gravity, Urine: 1.026 (ref 1.005–1.030)
pH: 6 (ref 5.0–8.0)

## 2016-03-16 LAB — PROTIME-INR
INR: 1.39
Prothrombin Time: 17.2 seconds — ABNORMAL HIGH (ref 11.4–15.2)

## 2016-03-16 LAB — GLUCOSE, CAPILLARY: Glucose-Capillary: 186 mg/dL — ABNORMAL HIGH (ref 65–99)

## 2016-03-16 LAB — PROCALCITONIN: PROCALCITONIN: 3.56 ng/mL

## 2016-03-16 LAB — URINE MICROSCOPIC-ADD ON

## 2016-03-16 LAB — MAGNESIUM: MAGNESIUM: 1.8 mg/dL (ref 1.7–2.4)

## 2016-03-16 LAB — CBG MONITORING, ED: Glucose-Capillary: 177 mg/dL — ABNORMAL HIGH (ref 65–99)

## 2016-03-16 LAB — APTT: aPTT: 47 seconds — ABNORMAL HIGH (ref 24–36)

## 2016-03-16 MED ORDER — SODIUM CHLORIDE 0.9 % IV BOLUS (SEPSIS)
1000.0000 mL | Freq: Once | INTRAVENOUS | Status: AC
  Administered 2016-03-16: 1000 mL via INTRAVENOUS

## 2016-03-16 MED ORDER — ENSURE ENLIVE PO LIQD
237.0000 mL | Freq: Two times a day (BID) | ORAL | Status: DC
  Administered 2016-03-17 – 2016-03-18 (×4): 237 mL via ORAL

## 2016-03-16 MED ORDER — ONDANSETRON HCL 4 MG PO TABS: 8.0000 mg | ORAL_TABLET | Freq: Three times a day (TID) | ORAL | Status: DC | PRN

## 2016-03-16 MED ORDER — LORAZEPAM 0.5 MG PO TABS: 0.5000 mg | ORAL_TABLET | Freq: Every evening | ORAL | Status: DC | PRN

## 2016-03-16 MED ORDER — POTASSIUM CHLORIDE CRYS ER 20 MEQ PO TBCR
40.0000 meq | EXTENDED_RELEASE_TABLET | Freq: Once | ORAL | Status: AC
  Administered 2016-03-16: 40 meq via ORAL
  Filled 2016-03-16: qty 2

## 2016-03-16 MED ORDER — ASPIRIN EC 81 MG PO TBEC
81.0000 mg | DELAYED_RELEASE_TABLET | Freq: Every day | ORAL | Status: DC
  Administered 2016-03-17 – 2016-03-19 (×3): 81 mg via ORAL
  Filled 2016-03-16 (×3): qty 1

## 2016-03-16 MED ORDER — PIPERACILLIN-TAZOBACTAM 3.375 G IVPB 30 MIN
3.3750 g | Freq: Once | INTRAVENOUS | Status: AC
  Administered 2016-03-16: 3.375 g via INTRAVENOUS
  Filled 2016-03-16: qty 50

## 2016-03-16 MED ORDER — ONDANSETRON HCL 4 MG PO TABS: 4.0000 mg | ORAL_TABLET | Freq: Four times a day (QID) | ORAL | Status: DC | PRN

## 2016-03-16 MED ORDER — VANCOMYCIN HCL IN DEXTROSE 1-5 GM/200ML-% IV SOLN
1000.0000 mg | Freq: Once | INTRAVENOUS | Status: AC
  Administered 2016-03-16: 1000 mg via INTRAVENOUS
  Filled 2016-03-16: qty 200

## 2016-03-16 MED ORDER — LEVOTHYROXINE SODIUM 50 MCG PO TABS
150.0000 ug | ORAL_TABLET | Freq: Every day | ORAL | Status: DC
  Administered 2016-03-17 – 2016-03-19 (×3): 150 ug via ORAL
  Filled 2016-03-16 (×3): qty 1

## 2016-03-16 MED ORDER — ACETAMINOPHEN 650 MG RE SUPP: 650.0000 mg | Freq: Four times a day (QID) | RECTAL | Status: DC | PRN

## 2016-03-16 MED ORDER — PIPERACILLIN-TAZOBACTAM 3.375 G IVPB
3.3750 g | Freq: Three times a day (TID) | INTRAVENOUS | Status: DC
  Administered 2016-03-17 – 2016-03-19 (×7): 3.375 g via INTRAVENOUS
  Filled 2016-03-16 (×7): qty 50

## 2016-03-16 MED ORDER — INSULIN ASPART 100 UNIT/ML ~~LOC~~ SOLN: 0.0000 [IU] | Freq: Every day | SUBCUTANEOUS | Status: DC

## 2016-03-16 MED ORDER — ACETAMINOPHEN 325 MG PO TABS
650.0000 mg | ORAL_TABLET | Freq: Four times a day (QID) | ORAL | Status: DC | PRN
  Administered 2016-03-17 – 2016-03-18 (×3): 650 mg via ORAL
  Filled 2016-03-16 (×3): qty 2

## 2016-03-16 MED ORDER — AMLODIPINE BESYLATE 5 MG PO TABS
10.0000 mg | ORAL_TABLET | Freq: Every day | ORAL | Status: DC
  Administered 2016-03-17 – 2016-03-19 (×3): 10 mg via ORAL
  Filled 2016-03-16 (×3): qty 2

## 2016-03-16 MED ORDER — SODIUM CHLORIDE 0.9 % IV SOLN
INTRAVENOUS | Status: DC
  Administered 2016-03-16 – 2016-03-18 (×5): via INTRAVENOUS

## 2016-03-16 MED ORDER — HEPARIN SOD (PORK) LOCK FLUSH 100 UNIT/ML IV SOLN
500.0000 [IU] | Freq: Once | INTRAVENOUS | Status: AC | PRN
  Administered 2016-03-16: 500 [IU]
  Filled 2016-03-16: qty 5

## 2016-03-16 MED ORDER — POLYETHYLENE GLYCOL 3350 17 G PO PACK: 17.0000 g | PACK | Freq: Every day | ORAL | Status: DC | PRN

## 2016-03-16 MED ORDER — LORATADINE 10 MG PO TABS: 10.0000 mg | ORAL_TABLET | Freq: Every day | ORAL | Status: DC | PRN

## 2016-03-16 MED ORDER — SODIUM CHLORIDE 0.9% FLUSH
10.0000 mL | INTRAVENOUS | Status: DC | PRN
  Administered 2016-03-16: 10 mL
  Filled 2016-03-16: qty 10

## 2016-03-16 MED ORDER — ROSUVASTATIN CALCIUM 10 MG PO TABS
10.0000 mg | ORAL_TABLET | ORAL | Status: DC
  Administered 2016-03-17: 10 mg via ORAL
  Filled 2016-03-16: qty 1

## 2016-03-16 MED ORDER — LEVOTHYROXINE SODIUM 150 MCG PO TABS: 150.0000 ug | ORAL_TABLET | Freq: Every day | ORAL | Status: DC

## 2016-03-16 MED ORDER — FLUTICASONE PROPIONATE 50 MCG/ACT NA SUSP
2.0000 | Freq: Every day | NASAL | Status: DC | PRN
  Filled 2016-03-16: qty 16

## 2016-03-16 MED ORDER — ONDANSETRON HCL 4 MG/2ML IJ SOLN: 4.0000 mg | Freq: Four times a day (QID) | INTRAMUSCULAR | Status: DC | PRN

## 2016-03-16 MED ORDER — VANCOMYCIN HCL IN DEXTROSE 1-5 GM/200ML-% IV SOLN
1000.0000 mg | Freq: Two times a day (BID) | INTRAVENOUS | Status: DC
  Administered 2016-03-17 – 2016-03-19 (×5): 1000 mg via INTRAVENOUS
  Filled 2016-03-16 (×5): qty 200

## 2016-03-16 MED ORDER — SODIUM CHLORIDE 0.9% FLUSH
10.0000 mL | INTRAVENOUS | Status: DC | PRN
  Administered 2016-03-18 (×2): 10 mL
  Filled 2016-03-16 (×2): qty 40

## 2016-03-16 MED ORDER — SODIUM CHLORIDE 0.9% FLUSH
3.0000 mL | Freq: Two times a day (BID) | INTRAVENOUS | Status: DC
  Administered 2016-03-16 – 2016-03-18 (×2): 3 mL via INTRAVENOUS

## 2016-03-16 MED ORDER — MONTELUKAST SODIUM 10 MG PO TABS
10.0000 mg | ORAL_TABLET | Freq: Every evening | ORAL | Status: DC
  Administered 2016-03-17 – 2016-03-18 (×2): 10 mg via ORAL
  Filled 2016-03-16 (×3): qty 1

## 2016-03-16 MED ORDER — ENOXAPARIN SODIUM 40 MG/0.4ML ~~LOC~~ SOLN
40.0000 mg | Freq: Every day | SUBCUTANEOUS | Status: DC
  Administered 2016-03-17 – 2016-03-18 (×2): 40 mg via SUBCUTANEOUS
  Filled 2016-03-16 (×3): qty 0.4

## 2016-03-16 MED ORDER — INSULIN ASPART 100 UNIT/ML ~~LOC~~ SOLN
0.0000 [IU] | Freq: Three times a day (TID) | SUBCUTANEOUS | Status: DC
  Administered 2016-03-17: 1 [IU] via SUBCUTANEOUS
  Administered 2016-03-17: 2 [IU] via SUBCUTANEOUS
  Administered 2016-03-18: 3 [IU] via SUBCUTANEOUS
  Administered 2016-03-18: 2 [IU] via SUBCUTANEOUS

## 2016-03-16 MED ORDER — HYDROCODONE-ACETAMINOPHEN 5-325 MG PO TABS: 1.0000 | ORAL_TABLET | ORAL | Status: DC | PRN

## 2016-03-16 MED ORDER — ACETAMINOPHEN 325 MG PO TABS
650.0000 mg | ORAL_TABLET | Freq: Once | ORAL | Status: AC
  Administered 2016-03-16: 650 mg via ORAL
  Filled 2016-03-16: qty 2

## 2016-03-16 NOTE — Progress Notes (Signed)
[place] OFFICE PROGRESS NOTE   Diagnosis: Colon cancer  INTERVAL HISTORY:   Judith Stephens presents for an unscheduled visit. She completed another cycle of FOLFOX 03/14/2016.she reports feeling well until early this morning when she had a syncope event when going to the restroom at approximately 3 AM. Her husband was with her and stopped her from falling.she continues to feel weak today. No nausea/vomiting, diarrhea, or fever. No dyspnea. She reports drinking 4 bottles of water daily. Objective:  Vital signs in last 24 hours:  Blood pressure 124/68, pulse (!) 140, temperature 99.9 F (37.7 C), temperature source Oral, SpO2 97 %.    HEENT: the mucous membranes are moist, no thrush Resp: inspiratory rales at the right base, no respiratory distress Cardio: tachycardia, regular rate and rhythm GI: the liver edge is palpable in the midabdomen a stenting across the midline, nontender Vascular: no leg edema Skin:mildly diminished skin turgor   Portacath/PICC-without erythema  Lab Results:  Lab Results  Component Value Date   WBC 8.3 03/14/2016   HGB 10.2 (L) 03/14/2016   HCT 33.0 (L) 03/14/2016   MCV 81.1 03/14/2016   PLT 243 03/14/2016   NEUTROABS 4.8 03/14/2016    Lab Results  Component Value Date   CEA1 74.6 (H) 02/29/2016   EKG: Sinus tachycardia Imaging:  Dg Chest 2 View  Result Date: 03/16/2016 CLINICAL DATA:  Fever, malignant colonic neoplasm EXAM: CHEST  2 VIEW COMPARISON:  01/31/2016 FINDINGS: Cardiomediastinal silhouette is stable. Left subclavian Port-A-Cath with tip in SVC. No pneumothorax. Small fluid noted in right minor fissure. Bilateral pulmonary nodules are again noted. No segmental infiltrate or pulmonary edema. IMPRESSION: Left subclavian Port-A-Cath with tip in SVC. No pneumothorax. Small fluid noted in right minor fissure. Bilateral pulmonary nodules are again noted. No segmental infiltrate or pulmonary edema. Electronically Signed   By:  Lahoma Crocker M.D.   On: 03/16/2016 15:45    Medications: I have reviewed the patient's current medications.  Assessment/Plan: 1. 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 She began adjuvant Xeloda on 05/14/2012. Final cycle of Xeloda started on 10/22/2012.  CT abdomen/pelvis 01/10/2016-innumerable hepatic metastases  CT chest 01/12/2016-multiple pulmonary nodules consistent with metastases  Cycle 1 FOLFOX 02/01/2016  Cycle 2 FOLFOX 02/15/2016  Cycle 3 FOLFOX 02/29/2016  Cycle 4 FOLFOX 03/14/2016 2. Severe microcytic anemia likely secondary to iron deficiency related to bleeding from the colon tumor. The hemoglobin and MCV improved on oral iron. Oral iron was discontinued following office visit 12/16/2012.  Recurrent microcytic anemia July 2017  Stool positive for occult blood 02/01/2016  Referred to GI 3. Diabetes. 4. Hypertension. 5. Hyperlipidemia. 6. History of hand/foot syndrome secondary to Xeloda prompting the Xeloda to be discontinued after day 12, cycle 2. Improved. Xeloda was dose reduced beginning with cycle 3. 7. History of mild diarrhea secondary to Xeloda.  8. History of eye tearing-most likely related to seasonal allergies though this could be a manifestation of Xeloda toxicity.  9. Status post root canal 10/02/2012. 10. Colonoscopy 03/13/2013 by Dr. Amedeo Plenty. Pathology on transverse colon polyp showed a tubular adenoma. Next colonoscopy recommended at a three-year interval. 11. Anorexia/weight loss 12. Port-A-Cath placement by Dr. Dalbert Batman 01/31/2016 13. 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 Syncope event 03/16/2016, persistent unexplained tachycardia    Disposition:  Judith Stephens completed cycle 4 FOLFOX 03/14/2016. She was seen in the office for a scheduled pump disconnect today. She remained tachycardic while in the  office today and reported a syncope event  early this morning. A chest x-ray shows no acute change. She does not appear dehydrated.  When she ambulated a short distance in the office the pulse was elevated to 140 and she appeared dyspneic.She has a low-grade fever. I will refer her to the emergency room for further evaluation and to consider a CT to rule out a pulmonary embolism.  Betsy Coder, MD  03/16/2016  5:08 PM

## 2016-03-16 NOTE — Progress Notes (Signed)
Pharmacy Antibiotic Note  Judith Stephens is a 75 y.o. female admitted on 03/16/2016 with sepsis.  Pharmacy has been consulted for vancomycin and zosyn dosing.  Plan:  Vancomycin 1g IV q12h, goal trough 15-20 mcg/ml  Zosyn 3.375gm IV q8h (4hr extended infusions)  Follow up renal function & cultures, de-escalate as appropriate  Height: 5\' 10"  (177.8 cm) Weight: 176 lb (79.8 kg) IBW/kg (Calculated) : 68.5  Temp (24hrs), Avg:100 F (37.8 C), Min:99.7 F (37.6 C), Max:100.3 F (37.9 C)   Recent Labs Lab 03/14/16 0916 03/14/16 0916 03/16/16 1745 03/16/16 1755  WBC 8.3  --   --  11.1*  CREATININE  --  0.9  --  0.87  LATICACIDVEN  --   --  3.8*  --     Estimated Creatinine Clearance: 61.3 mL/min (by C-G formula based on SCr of 0.87 mg/dL).    Allergies  Allergen Reactions  . No Known Allergies Other (See Comments)    Antimicrobials this admission: 9/28 Vancomycin >> 9/28 Zosyn >>  Dose adjustments this admission: ---  Microbiology results: 9/28 BCx: sent 9/28 UCx: sent 9/28 Sputum: sent   Thank you for allowing pharmacy to be a part of this patient's care.  Peggyann Juba, PharmD, BCPS Pager: [phone] 03/16/2016 9:14 PM

## 2016-03-16 NOTE — ED Notes (Signed)
Patient transported to X-ray 

## 2016-03-16 NOTE — ED Notes (Signed)
Kohut at bedside. 

## 2016-03-16 NOTE — ED Triage Notes (Signed)
Pt presents [facility]  C/o tachycardia today and BLE weakness and syncope last night.  Denies pain.  Pt reports that she was walking to the restroom when she had the syncopal episode.  Hx of colon CA.  Pt just finished 2 day chemo treatment.

## 2016-03-16 NOTE — Progress Notes (Signed)
Pt stated that she passed out last 9/27 was feeling very sick. No NVD. Vts were taken. Pt has temp 100.3 , HR 127, bp 130/68, 100 o2 RA, 20RR. Dr. Benay Spice desk nurse was called, directed pt to lobby for further instructions from Dr. Benay Spice.

## 2016-03-16 NOTE — ED Provider Notes (Signed)
[facility] DEPT Provider Note   CSN: [id number] Arrival date & time: 03/16/16  1706     History   Chief Complaint Chief Complaint  Patient presents with  . Tachycardia  . Extremity Weakness  . Loss of Consciousness    HPI Judith Stephens is a 75 y.o. female.  HPI   10 female with generalized weakness. She has a past history metastatic colon cancer. She is currently undergoing chemotherapy. Last chemotherapy was on Tuesday. About 24 hours later she began feeling generally weak. Anorexic. Nausea, but no vomiting. Denies any acute pain. No respiratory complaints. Feels lightheaded. She reports that she has syncopal episode yesterday with a brief loss of consciousness. She says that she just felt lightheaded and passed out. Quick returned to baseline. There was no incontinence or oral trauma. No respiratory complaints.   Past Medical History:  Diagnosis Date  . Anemia    hx of blood transfusion 03/06/12   . Anxiety   . Arthritis   . Blood transfusion   . Cancer [facility])    colon cancer  . Diabetes mellitus   . Hyperlipidemia   . Hypertension   . Hypothyroidism     Patient Active Problem List   Diagnosis Date Noted  . Port catheter in place 03/14/2016  . Dehydration 02/06/2016  . Liver metastasis ([place]) 01/13/2016  . Lung metastasis ([place]) 01/13/2016  . Hyponatremia 01/09/2016  . Hypokalemia 01/09/2016  . Incisional hernia 03/08/2012  . Anemia 03/06/2012  . Colon cancer ([place]) 03/06/2012  . Diabetes mellitus ([place]) 03/06/2012  . HTN (hypertension) 03/06/2012    Past Surgical History:  Procedure Laterality Date  . COLON SURGERY  2013  . cyst on ovary Right    removed  . LAPAROSCOPY  03/14/2012   Procedure: LAPAROSCOPY DIAGNOSTIC;  Surgeon: Adin Hector, MD;  Location: [facility];  Service: General;;  right colectomy  . PORTACATH PLACEMENT N/A 01/31/2016   Procedure: INSERTION PORT-A-CATH WITH ULTRA SOUND;  Surgeon: Fanny Skates, MD;  Location: [facility];  Service: General;   Laterality: N/A;  . TUBAL LIGATION      OB History    No data available       Home Medications    Prior to Admission medications   Medication Sig Start Date End Date Taking? Authorizing Provider  amLODipine (NORVASC) 10 MG tablet Take 10 mg by mouth daily.    Yes Historical Provider, MD  aspirin EC 81 MG tablet Take 81 mg by mouth daily.    Yes Historical Provider, MD  fluticasone (FLONASE) 50 MCG/ACT nasal spray Place 2 sprays into the nose daily as needed for rhinitis. For allergies   Yes Historical Provider, MD  glipiZIDE (GLUCOTROL) 10 MG tablet Take 10 mg by mouth daily.   Yes Historical Provider, MD  HYDROcodone-acetaminophen (NORCO/VICODIN) 5-325 MG tablet Take 1-2 tablets by mouth every 4 (four) hours as needed for moderate pain or severe pain. 01/31/16  Yes Fanny Skates, MD  levothyroxine (SYNTHROID, LEVOTHROID) 150 MCG tablet Take 150 mcg by mouth daily before breakfast.   Yes Historical Provider, MD  lidocaine-prilocaine (EMLA) cream Apply 1 application topically as needed. Apply 1 hr prior to port access and cover with plastic wrap 01/25/16  Yes Ladell Pier, MD  loratadine (CLARITIN) 10 MG tablet Take 10 mg by mouth daily as needed for allergies.    Yes Historical Provider, MD  LORazepam (ATIVAN) 0.5 MG tablet Take 1 tablet (0.5 mg total) by mouth at bedtime as needed for anxiety. 01/21/16  Yes Ladell Pier, MD  montelukast (SINGULAIR) 10 MG tablet Take 1 tablet (10 mg total) by mouth every evening. 02/24/16  Yes Ladell Pier, MD  ondansetron (ZOFRAN) 8 MG tablet Take 1 tablet (8 mg total) by mouth every 8 (eight) hours as needed for nausea or vomiting. 01/21/16  Yes Ladell Pier, MD  rosuvastatin (CRESTOR) 10 MG tablet Take 10 mg by mouth every Monday, Wednesday, and Friday.    Yes Historical Provider, MD  prochlorperazine (COMPAZINE) 5 MG tablet Take 1 tablet (5 mg total) by mouth every 6 (six) hours as needed for nausea or vomiting. Patient not taking: Reported on  03/16/2016 02/24/16   Ladell Pier, MD    Family History Family History  Problem Relation Age of Onset  . Hypertension Father   . Hypertension Sister     x3  . Hypertension Brother     Social History Social History  Substance Use Topics  . Smoking status: Former Smoker    Packs/day: 0.25    Types: Cigarettes  . Smokeless tobacco: Never Used  . Alcohol use No     Comment: occasional wine      Allergies   No known allergies   Review of Systems Review of Systems  All systems reviewed and negative, other than as noted in HPI.   Physical Exam Updated Vital Signs BP 132/76   Pulse 104   Temp 99.7 F (37.6 C) (Oral)   Resp (!) 29   SpO2 100%   Physical Exam  Constitutional: She appears well-developed and well-nourished. No distress.  HENT:  Head: Normocephalic and atraumatic.  Eyes: Conjunctivae are normal. Right eye exhibits no discharge. Left eye exhibits no discharge.  Neck: Neck supple.  Cardiovascular: Regular rhythm and normal heart sounds.  Exam reveals no gallop and no friction rub.   No murmur heard. Tachycardic  Pulmonary/Chest: Effort normal and breath sounds normal. No respiratory distress.  Abdominal: Soft. She exhibits no distension. There is no tenderness.  Musculoskeletal: She exhibits no edema or tenderness.  Neurological: She is alert.  Skin: Skin is warm and dry.  Psychiatric: She has a normal mood and affect. Her behavior is normal. Thought content normal.  Nursing note and vitals reviewed.    ED Treatments / Results  Labs (all labs ordered are listed, but only abnormal results are displayed) Labs Reviewed  BASIC METABOLIC PANEL - Abnormal; Notable for the following:       Result Value   Sodium 134 (*)    Potassium 3.3 (*)    CO2 21 (*)    Glucose, Bld 173 (*)    All other components within normal limits  CBC - Abnormal; Notable for the following:    WBC 11.1 (*)    Hemoglobin 10.2 (*)    HCT 33.3 (*)    MCH 25.1 (*)    RDW  19.8 (*)    All other components within normal limits  URINALYSIS, ROUTINE W REFLEX MICROSCOPIC (NOT AT [facility]) - Abnormal; Notable for the following:    Color, Urine AMBER (*)    Hgb urine dipstick MODERATE (*)    Protein, ur 100 (*)    All other components within normal limits  LACTIC ACID, PLASMA - Abnormal; Notable for the following:    Lactic Acid, Venous 3.8 (*)    All other components within normal limits  URINE MICROSCOPIC-ADD ON - Abnormal; Notable for the following:    Squamous Epithelial / LPF 0-5 (*)  Bacteria, UA RARE (*)    Casts HYALINE CASTS (*)    All other components within normal limits  CBG MONITORING, ED - Abnormal; Notable for the following:    Glucose-Capillary 177 (*)    All other components within normal limits  CULTURE, BLOOD (ROUTINE X 2)  CULTURE, BLOOD (ROUTINE X 2)  LACTIC ACID, PLASMA    EKG  EKG Interpretation  Date/Time:  Thursday March 16 2016 17:15:30 EDT Ventricular Rate:  123 PR Interval:    QRS Duration: 78 QT Interval:  305 QTC Calculation: 437 R Axis:   -67 Text Interpretation:  Sinus tachycardia Inferior infarct, old Consider anterior infarct Confirmed by Charlese Gruetzmacher  MD, Jenny Lai [phone]) on 03/16/2016 5:46:22 PM       Radiology Dg Chest 2 View  Result Date: 03/16/2016 CLINICAL DATA:  Tachycardia.  Lower extremity weakness and syncope EXAM: CHEST  2 VIEW COMPARISON:  03/16/2016 FINDINGS: There is a left chest wall port a catheter with tip at the cavoatrial junction. Heart size is normal. There is asymmetric elevation of the right hemidiaphragm. Thickening along the minor fissure is unchanged from previous study. Bilateral pulmonary nodules are again noted. No airspace consolidation. IMPRESSION: 1. No acute cardiopulmonary abnormalities. 2. Bilateral pulmonary nodules compatible with metastatic disease. Similar to previous exam Electronically Signed   By: Kerby Moors M.D.   On: 03/16/2016 18:33   Dg Chest 2 View  Result Date:  03/16/2016 CLINICAL DATA:  Fever, malignant colonic neoplasm EXAM: CHEST  2 VIEW COMPARISON:  01/31/2016 FINDINGS: Cardiomediastinal silhouette is stable. Left subclavian Port-A-Cath with tip in SVC. No pneumothorax. Small fluid noted in right minor fissure. Bilateral pulmonary nodules are again noted. No segmental infiltrate or pulmonary edema. IMPRESSION: Left subclavian Port-A-Cath with tip in SVC. No pneumothorax. Small fluid noted in right minor fissure. Bilateral pulmonary nodules are again noted. No segmental infiltrate or pulmonary edema. Electronically Signed   By: Lahoma Crocker M.D.   On: 03/16/2016 15:45    Procedures Procedures (including critical care time)  Medications Ordered in ED Medications  acetaminophen (TYLENOL) tablet 650 mg (650 mg Oral Given 03/16/16 1812)  sodium chloride 0.9 % bolus 1,000 mL (0 mLs Intravenous Stopped 03/16/16 1936)  sodium chloride 0.9 % bolus 1,000 mL (1,000 mLs Intravenous New Bag/Given 03/16/16 1936)     Initial Impression / Assessment and Plan / ED Course  I have reviewed the triage vital signs and the nursing notes.  Pertinent labs & imaging results that were available during my care of the patient were reviewed by me and considered in my medical decision making (see chart for details).  Clinical Course    75 year old female with generalized weakness. She is febrile. No clear source. Her tachycardia is improving with antipyretics and IV fluids. She is not hypotensive. Her lactic acid is elevated at 3.8 though. No clear source of her fever. Chest x-ray is clear. UA not consistent with infection. She has no specific complaints. No concerning rash or Port-A-Cath site looks fine. She is currently undergoing chemotherapy, per her neutrophil count is normal. Feel may be prudent to admit her overnight for observation.   Final Clinical Impressions(s) / ED Diagnoses   Final diagnoses:  SIRS (systemic inflammatory response syndrome) (HCC)  Hypokalemia     New Prescriptions New Prescriptions   No medications on file     Virgel Manifold, MD 03/16/16 2006

## 2016-03-16 NOTE — Telephone Encounter (Signed)
Per 9/28 los f/u as scheduled. Left message for patient re appointment with Dr. Amedeo Plenty at [facility] 10/3 @ 2:30 pm.

## 2016-03-16 NOTE — ED Notes (Signed)
Nurse is access her port to collect labs

## 2016-03-16 NOTE — ED Notes (Signed)
Pt being sent by [facility].  C/o tachycardia (120s at rest and 140s w/ ambulation).  Hx of colon Ca.  Pt just finished 2 day chemo treatment.

## 2016-03-16 NOTE — H&P (Signed)
History and Physical    Nolani Norberto [id number] DOB: Mar 16, 1941 DOA: 03/16/2016  PCP: Donnie Coffin, MD   Patient coming from: Home, by way of [place]   Chief Complaint: Syncope   HPI: Derrion Kotowski is a 75 y.o. female with medical history significant for type 2 diabetes mellitus, hypertension, hypothyroidism, anxiety, and colon cancer status post partial resection who presents to the emergency department at the direction of her oncologist for evaluation of a syncopal episode earlier in the day and tachycardia. Patient is under the care of Dr. Benay Spice of oncology and receiving treatment with FOLFOX, last infusion 03/14/2016. She reports going to bed the night prior to admission with a little lightheadedness, generalized weakness, and malaise which she reports experiencing after FOLFOX infusions in the past. She woke approximately 3 AM on the day of her admission to use the bathroom, felt lightheaded upon standing, and experienced a syncopal episode. Her husband was assisting her and caught her, preventing a fall. Patient regained consciousness and was fully oriented within approximately 1 minute, but continued to feel lightheaded and a vague sense of malaise. She is scheduled for same-day appointment to see her oncologist for evaluation of these symptoms, was noted to be tachycardic in the 120s at rest and 140s with ambulation, have temperature of 37.9 C, and was directed to the emergency department for further evaluation. Patient notes chills, but has not recorded a temperature at home and reports taking daily Tylenol and Norco. She denies new cough or dyspnea, or chest pain or palpitations. Patient denies abdominal pain, nausea, vomiting, or diarrhea. There has been no wound or rash and she also denies dysuria or flank pain. There has been no headache, change in vision or hearing, or focal numbness or weakness. There is no personal or family history of VT E, she has not undergone recent surgery,  and has not been immobilized.  ED Course: Upon arrival to the ED, patient is found to be afebrile, saturating well on room air, tachypneic to 29, tachycardic in the 120s, and with stable blood pressure. EKG demonstrates sinus tachycardia and left axis deviation. Chest x-ray is negative for acute cardiopulmonary disease but the known bilateral pulmonary nodules, likely metastases, are noted. Chemistry panel features a potassium of 3.3, bicarbonate of 21, and glucose of 173. CBC is notable for a stable normocytic anemia with hemoglobin of 10.2, and a leukocytosis to 11,100. Urinalysis is notable for moderate hemoglobin but is not suggestive of infection. Lactic acid returns elevated to a value of 3.8. Blood cultures were obtained and the patient was given 2 L normal saline as a bolus in the emergency department. She was also treated with acetaminophen in the ED. Tachycardia has nearly resolved as the second liter of fluid is completing. Tachypnea has resolved and she is in no respiratory distress. She will be observed on the telemetry unit for ongoing evaluation and management of sepsis with source not yet identified.   Review of Systems:  All other systems reviewed and apart from HPI, are negative.  Past Medical History:  Diagnosis Date  . Anemia    hx of blood transfusion 03/06/12   . Anxiety   . Arthritis   . Blood transfusion   . Cancer [facility])    colon cancer  . Diabetes mellitus   . Hyperlipidemia   . Hypertension   . Hypothyroidism     Past Surgical History:  Procedure Laterality Date  . COLON SURGERY  2013  . cyst on ovary Right  removed  . LAPAROSCOPY  03/14/2012   Procedure: LAPAROSCOPY DIAGNOSTIC;  Surgeon: Adin Hector, MD;  Location: [facility];  Service: General;;  right colectomy  . PORTACATH PLACEMENT N/A 01/31/2016   Procedure: INSERTION PORT-A-CATH WITH ULTRA SOUND;  Surgeon: Fanny Skates, MD;  Location: [place] [facility];  Service: General;  Laterality: N/A;  . TUBAL LIGATION        reports that she has quit smoking. Her smoking use included Cigarettes. She smoked 0.25 packs per day. She has never used smokeless tobacco. She reports that she does not drink alcohol or use drugs.  Allergies  Allergen Reactions  . No Known Allergies Other (See Comments)    Family History  Problem Relation Age of Onset  . Hypertension Father   . Hypertension Sister     x3  . Hypertension Brother      Prior to Admission medications   Medication Sig Start Date End Date Taking? Authorizing Provider  amLODipine (NORVASC) 10 MG tablet Take 10 mg by mouth daily.    Yes Historical Provider, MD  aspirin EC 81 MG tablet Take 81 mg by mouth daily.    Yes Historical Provider, MD  fluticasone (FLONASE) 50 MCG/ACT nasal spray Place 2 sprays into the nose daily as needed for rhinitis. For allergies   Yes Historical Provider, MD  glipiZIDE (GLUCOTROL) 10 MG tablet Take 10 mg by mouth daily.   Yes Historical Provider, MD  HYDROcodone-acetaminophen (NORCO/VICODIN) 5-325 MG tablet Take 1-2 tablets by mouth every 4 (four) hours as needed for moderate pain or severe pain. 01/31/16  Yes Fanny Skates, MD  levothyroxine (SYNTHROID, LEVOTHROID) 150 MCG tablet Take 150 mcg by mouth daily before breakfast.   Yes Historical Provider, MD  lidocaine-prilocaine (EMLA) cream Apply 1 application topically as needed. Apply 1 hr prior to port access and cover with plastic wrap 01/25/16  Yes Ladell Pier, MD  loratadine (CLARITIN) 10 MG tablet Take 10 mg by mouth daily as needed for allergies.    Yes Historical Provider, MD  LORazepam (ATIVAN) 0.5 MG tablet Take 1 tablet (0.5 mg total) by mouth at bedtime as needed for anxiety. 01/21/16  Yes Ladell Pier, MD  montelukast (SINGULAIR) 10 MG tablet Take 1 tablet (10 mg total) by mouth every evening. 02/24/16  Yes Ladell Pier, MD  ondansetron (ZOFRAN) 8 MG tablet Take 1 tablet (8 mg total) by mouth every 8 (eight) hours as needed for nausea or vomiting. 01/21/16   Yes Ladell Pier, MD  rosuvastatin (CRESTOR) 10 MG tablet Take 10 mg by mouth every Monday, Wednesday, and Friday.    Yes Historical Provider, MD  prochlorperazine (COMPAZINE) 5 MG tablet Take 1 tablet (5 mg total) by mouth every 6 (six) hours as needed for nausea or vomiting. Patient not taking: Reported on 03/16/2016 02/24/16   Ladell Pier, MD    Physical Exam: Vitals:   03/16/16 1720 03/16/16 1826 03/16/16 1930 03/16/16 2033  BP: 136/72 133/85 132/76 128/74  Pulse: (!) 123 120 104 102  Resp: 23 (!) 29 (!) 29 21  Temp: 99.7 F (37.6 C)     TempSrc: Oral     SpO2: 100% 99% 100% 99%  Weight:    79.8 kg (176 lb)  Height:    5\' 10"  (1.778 m)      Constitutional: NAD, calm, comfortable Eyes: PERTLA, lids and conjunctivae normal ENMT: Mucous membranes are dry. Posterior pharynx clear of any exudate or lesions.   Neck: normal, supple, no masses,  no thyromegaly Respiratory: clear to auscultation bilaterally, no wheezing, no crackles. Normal respiratory effort.   Cardiovascular: Rate ~110 and regular. No extremity edema. No significant JVD. Abdomen: No distension, no tenderness, no masses palpated. Bowel sounds normal.  Musculoskeletal: no clubbing / cyanosis. No joint deformity upper and lower extremities. Normal muscle tone.  Skin: no significant rashes, lesions, ulcers. Warm, dry, well-perfused. Neurologic: CN 2-12 grossly intact. Sensation intact, DTR normal. Strength 5/5 in all 4 limbs.  Psychiatric: Normal judgment and insight. Alert and oriented x 3. Normal mood and affect.     Labs on Admission: I have personally reviewed following labs and imaging studies  CBC:  Recent Labs Lab 03/14/16 0916 03/16/16 1755  WBC 8.3 11.1*  NEUTROABS 4.8  --   HGB 10.2* 10.2*  HCT 33.0* 33.3*  MCV 81.1 81.8  PLT 243 [id number]   Basic Metabolic Panel:  Recent Labs Lab 03/14/16 0916 03/16/16 1755  NA 141 134*  K 4.1 3.3*  CL  --  101  CO2 25 21*  GLUCOSE 167* 173*  BUN 9.9 14   CREATININE 0.9 0.87  CALCIUM 10.4 9.7   GFR: Estimated Creatinine Clearance: 61.3 mL/min (by C-G formula based on SCr of 0.87 mg/dL). Liver Function Tests:  Recent Labs Lab 03/14/16 0916  AST 77*  ALT 10  ALKPHOS 127  BILITOT 0.38  PROT 7.7  ALBUMIN 2.6*   No results for input(s): LIPASE, AMYLASE in the last 168 hours. No results for input(s): AMMONIA in the last 168 hours. Coagulation Profile: No results for input(s): INR, PROTIME in the last 168 hours. Cardiac Enzymes: No results for input(s): CKTOTAL, CKMB, CKMBINDEX, TROPONINI in the last 168 hours. BNP (last 3 results) No results for input(s): PROBNP in the last 8760 hours. HbA1C: No results for input(s): HGBA1C in the last 72 hours. CBG:  Recent Labs Lab 03/16/16 1719  GLUCAP 177*   Lipid Profile: No results for input(s): CHOL, HDL, LDLCALC, TRIG, CHOLHDL, LDLDIRECT in the last 72 hours. Thyroid Function Tests: No results for input(s): TSH, T4TOTAL, FREET4, T3FREE, THYROIDAB in the last 72 hours. Anemia Panel: No results for input(s): VITAMINB12, FOLATE, FERRITIN, TIBC, IRON, RETICCTPCT in the last 72 hours. Urine analysis:    Component Value Date/Time   COLORURINE AMBER (A) 03/16/2016 1715   APPEARANCEUR CLEAR 03/16/2016 1715   LABSPEC 1.026 03/16/2016 1715   PHURINE 6.0 03/16/2016 1715   GLUCOSEU NEGATIVE 03/16/2016 1715   HGBUR MODERATE (A) 03/16/2016 1715   BILIRUBINUR NEGATIVE 03/16/2016 1715   KETONESUR NEGATIVE 03/16/2016 1715   PROTEINUR 100 (A) 03/16/2016 1715   NITRITE NEGATIVE 03/16/2016 1715   LEUKOCYTESUR NEGATIVE 03/16/2016 1715   Sepsis Labs: @LABRCNTIP (procalcitonin:4,lacticidven:4) )No results found for this or any previous visit (from the past 240 hour(s)).   Radiological Exams on Admission: Dg Chest 2 View  Result Date: 03/16/2016 CLINICAL DATA:  Tachycardia.  Lower extremity weakness and syncope EXAM: CHEST  2 VIEW COMPARISON:  03/16/2016 FINDINGS: There is a left chest wall  port a catheter with tip at the cavoatrial junction. Heart size is normal. There is asymmetric elevation of the right hemidiaphragm. Thickening along the minor fissure is unchanged from previous study. Bilateral pulmonary nodules are again noted. No airspace consolidation. IMPRESSION: 1. No acute cardiopulmonary abnormalities. 2. Bilateral pulmonary nodules compatible with metastatic disease. Similar to previous exam Electronically Signed   By: Kerby Moors M.D.   On: 03/16/2016 18:33   Dg Chest 2 View  Result Date: 03/16/2016 CLINICAL DATA:  Fever, malignant  colonic neoplasm EXAM: CHEST  2 VIEW COMPARISON:  01/31/2016 FINDINGS: Cardiomediastinal silhouette is stable. Left subclavian Port-A-Cath with tip in SVC. No pneumothorax. Small fluid noted in right minor fissure. Bilateral pulmonary nodules are again noted. No segmental infiltrate or pulmonary edema. IMPRESSION: Left subclavian Port-A-Cath with tip in SVC. No pneumothorax. Small fluid noted in right minor fissure. Bilateral pulmonary nodules are again noted. No segmental infiltrate or pulmonary edema. Electronically Signed   By: Lahoma Crocker M.D.   On: 03/16/2016 15:45    EKG: Independently reviewed. Sinus tachycardia (rate 121), LAD Assessment/Plan  1. Sepsis, unknown source  - Meets sepsis criteria on admission, lactate elevated to 3.8, source not yet apparent  - CXR without acute airspace disease; UA not suggestive on infxn; abd exam benign; no rash or wound; no meningismus  - With her port, chills, and poor appetite, there is some concern for a bacteremia  - Blood cultures are incubating; sputum and urine cultures requested  - 30 cc/kg NS given in ED  - Start empiric vancomycin and Zosyn while following cultures and clinical progress  - Trend lactate, continue IVF hydration   2. Syncope  - Pt appears dehydrated on arrival and suspect this was related to orthostasis  - Will check orthostatic vitals, though patient has already been  fluid-resuscitated with 30 cc/kg NS   - Monitor on telemetry for arrhythmia; check TTE for structural cardiac etiology  - PE was considered, but tachypnea and tachycardia has resolved with IVF and there is no other clinical sign of DVT/PE; other diagnoses more likely; could reconsider if her condition changes    3. Colon cancer, metastatic to liver and lung  - Followed by Dr. Benay Spice at the [facility] right colectomy in 2013, followed by treatment with Xeloda - Liver and lung mets identified in July 2017 and she has now completed 4th cycle of FOLFOX on 03/15/16 - Ongoing management per oncology team   4. Hypokalemia  - Serum potassium 3.3 on admission - 40 mEq oral potassium given  - Magnesium level pending, replete prn   - Repeat chem panel in am   5. Type II DM  - A1c 7.6% in August 2017  - Managed with glipizide only at home; will hold this while in hospital  - Check CBG with meals and qHS  - Start with low-intensity sliding-scale correctional only and adjust prn   6. Hypertension  - At goal currently - Continue Norvasc    7. Normocytic anemia - Hgb 10.2 on admission and stable relative to recent priors in 9-10 range  - Likely secondary to chronic disease, chronic GI blood-loss    - Followed by heme/onc and previously received iron infusions  - Was referred to GI last month when MCV was low and FOBT was positive    DVT prophylaxis: sq Lovenox  Code Status: Full  Family Communication: Discussed with patient Disposition Plan: Observe on telemetry Consults called: None Admission status: Observation    Vianne Bulls, MD [facility] Pager [phone]  If 7PM-7AM, please contact night-coverage [email or web address] Password [facility]  03/16/2016, 9:09 PM

## 2016-03-16 NOTE — ED Notes (Signed)
Bed: WA15 Expected date:  Expected time:  Means of arrival:  Comments: RES B 

## 2016-03-17 ENCOUNTER — Observation Stay (HOSPITAL_BASED_OUTPATIENT_CLINIC_OR_DEPARTMENT_OTHER): Payer: Medicare Other

## 2016-03-17 ENCOUNTER — Ambulatory Visit: Payer: Medicare Other | Admitting: Podiatry

## 2016-03-17 DIAGNOSIS — R509 Fever, unspecified: Secondary | ICD-10-CM | POA: Diagnosis not present

## 2016-03-17 DIAGNOSIS — E119 Type 2 diabetes mellitus without complications: Secondary | ICD-10-CM | POA: Diagnosis not present

## 2016-03-17 DIAGNOSIS — R55 Syncope and collapse: Secondary | ICD-10-CM

## 2016-03-17 DIAGNOSIS — C787 Secondary malignant neoplasm of liver and intrahepatic bile duct: Secondary | ICD-10-CM | POA: Diagnosis present

## 2016-03-17 DIAGNOSIS — I479 Paroxysmal tachycardia, unspecified: Secondary | ICD-10-CM

## 2016-03-17 DIAGNOSIS — Z79899 Other long term (current) drug therapy: Secondary | ICD-10-CM | POA: Diagnosis not present

## 2016-03-17 DIAGNOSIS — Z7982 Long term (current) use of aspirin: Secondary | ICD-10-CM | POA: Diagnosis not present

## 2016-03-17 DIAGNOSIS — E43 Unspecified severe protein-calorie malnutrition: Secondary | ICD-10-CM | POA: Insufficient documentation

## 2016-03-17 DIAGNOSIS — R000 Tachycardia, unspecified: Secondary | ICD-10-CM | POA: Diagnosis present

## 2016-03-17 DIAGNOSIS — Z9049 Acquired absence of other specified parts of digestive tract: Secondary | ICD-10-CM | POA: Diagnosis not present

## 2016-03-17 DIAGNOSIS — E876 Hypokalemia: Secondary | ICD-10-CM | POA: Diagnosis present

## 2016-03-17 DIAGNOSIS — E871 Hypo-osmolality and hyponatremia: Secondary | ICD-10-CM | POA: Diagnosis not present

## 2016-03-17 DIAGNOSIS — Z7984 Long term (current) use of oral hypoglycemic drugs: Secondary | ICD-10-CM | POA: Diagnosis not present

## 2016-03-17 DIAGNOSIS — E785 Hyperlipidemia, unspecified: Secondary | ICD-10-CM | POA: Diagnosis present

## 2016-03-17 DIAGNOSIS — A419 Sepsis, unspecified organism: Secondary | ICD-10-CM | POA: Diagnosis not present

## 2016-03-17 DIAGNOSIS — I10 Essential (primary) hypertension: Secondary | ICD-10-CM | POA: Diagnosis not present

## 2016-03-17 DIAGNOSIS — D649 Anemia, unspecified: Secondary | ICD-10-CM | POA: Diagnosis present

## 2016-03-17 DIAGNOSIS — C182 Malignant neoplasm of ascending colon: Secondary | ICD-10-CM

## 2016-03-17 DIAGNOSIS — C7800 Secondary malignant neoplasm of unspecified lung: Secondary | ICD-10-CM | POA: Diagnosis present

## 2016-03-17 DIAGNOSIS — I119 Hypertensive heart disease without heart failure: Secondary | ICD-10-CM

## 2016-03-17 DIAGNOSIS — I951 Orthostatic hypotension: Secondary | ICD-10-CM | POA: Diagnosis present

## 2016-03-17 DIAGNOSIS — Z87891 Personal history of nicotine dependence: Secondary | ICD-10-CM | POA: Diagnosis not present

## 2016-03-17 LAB — BASIC METABOLIC PANEL
Anion gap: 7 (ref 5–15)
BUN: 12 mg/dL (ref 6–20)
CHLORIDE: 107 mmol/L (ref 101–111)
CO2: 23 mmol/L (ref 22–32)
Calcium: 8.7 mg/dL — ABNORMAL LOW (ref 8.9–10.3)
Creatinine, Ser: 0.71 mg/dL (ref 0.44–1.00)
GFR calc non Af Amer: >60 mL/min (ref >60)
Glucose, Bld: 147 mg/dL — ABNORMAL HIGH (ref 65–99)
POTASSIUM: 3.2 mmol/L — AB (ref 3.5–5.1)
SODIUM: 137 mmol/L (ref 135–145)

## 2016-03-17 LAB — EXPECTORATED SPUTUM ASSESSMENT W GRAM STAIN, RFLX TO RESP C

## 2016-03-17 LAB — ECHOCARDIOGRAM COMPLETE
HEIGHTINCHES: 70 in
WEIGHTICAEL: 2885.38 [oz_av]

## 2016-03-17 LAB — LACTIC ACID, PLASMA: Lactic Acid, Venous: 2.7 mmol/L (ref 0.5–1.9)

## 2016-03-17 LAB — GLUCOSE, CAPILLARY
GLUCOSE-CAPILLARY: 86 mg/dL (ref 65–99)
GLUCOSE-CAPILLARY: 172 mg/dL — AB (ref 65–99)
Glucose-Capillary: 118 mg/dL — ABNORMAL HIGH (ref 65–99)

## 2016-03-17 LAB — CBC
HEMATOCRIT: 28.7 % — AB (ref 36.0–46.0)
HEMOGLOBIN: 8.9 g/dL — AB (ref 12.0–15.0)
MCH: 25.3 pg — AB (ref 26.0–34.0)
MCHC: 31 g/dL (ref 30.0–36.0)
MCV: 81.5 fL (ref 78.0–100.0)
Platelets: 163 10*3/uL (ref 150–400)
RBC: 3.52 MIL/uL — AB (ref 3.87–5.11)
RDW: 19.9 % — ABNORMAL HIGH (ref 11.5–15.5)
WBC: 9.2 10*3/uL (ref 4.0–10.5)

## 2016-03-17 LAB — EXPECTORATED SPUTUM ASSESSMENT W REFEX TO RESP CULTURE

## 2016-03-17 LAB — MAGNESIUM: MAGNESIUM: 2 mg/dL (ref 1.7–2.4)

## 2016-03-17 MED ORDER — SODIUM CHLORIDE 0.9 % IV BOLUS (SEPSIS)
1000.0000 mL | Freq: Once | INTRAVENOUS | Status: AC
  Administered 2016-03-17: 1000 mL via INTRAVENOUS

## 2016-03-17 MED ORDER — POTASSIUM CHLORIDE CRYS ER 20 MEQ PO TBCR
40.0000 meq | EXTENDED_RELEASE_TABLET | Freq: Two times a day (BID) | ORAL | Status: AC
  Administered 2016-03-17 (×2): 40 meq via ORAL
  Filled 2016-03-17 (×2): qty 2

## 2016-03-17 MED ORDER — MAGNESIUM SULFATE IN D5W 1-5 GM/100ML-% IV SOLN
1.0000 g | Freq: Once | INTRAVENOUS | Status: AC
  Administered 2016-03-17: 1 g via INTRAVENOUS
  Filled 2016-03-17: qty 100

## 2016-03-17 MED ORDER — KETOROLAC TROMETHAMINE 30 MG/ML IJ SOLN
30.0000 mg | Freq: Once | INTRAMUSCULAR | Status: AC
  Administered 2016-03-18: 30 mg via INTRAVENOUS
  Filled 2016-03-17: qty 1

## 2016-03-17 NOTE — Progress Notes (Signed)
IP PROGRESS NOTE  Subjective:   No complaint this morning.  Objective: Vital signs in last 24 hours: Blood pressure 115/74, pulse (!) 110, temperature 98.6 F (37 C), temperature source Oral, resp. rate (!) 22, height 5\' 10"  (1.778 m), weight 180 lb 5.4 oz (81.8 kg), SpO2 100 %.  Intake/Output from previous day: 09/28 0701 - 09/29 0700 In: 463.8 [I.V.:463.8] Out: 925 [Urine:925]  Physical Exam:  HEENT: No thrush Lungs: Clear bilaterally Cardiac: Tachycardia, regular rhythm Abdomen: The liver is palpable in the midabdomen Extremities: No leg edema  Portacath/PICC-without erythema or tenderness  Lab Results:  Recent Labs  03/16/16 1755 03/17/16 0443  WBC 11.1* 9.2  HGB 10.2* 8.9*  HCT 33.3* 28.7*  PLT 239 163    BMET  Recent Labs  03/16/16 1755 03/17/16 0443  NA 134* 137  K 3.3* 3.2*  CL 101 107  CO2 21* 23  GLUCOSE 173* 147*  BUN 14 12  CREATININE 0.87 0.71  CALCIUM 9.7 8.7*    Studies/Results: Dg Chest 2 View  Result Date: 03/16/2016 CLINICAL DATA:  Tachycardia.  Lower extremity weakness and syncope EXAM: CHEST  2 VIEW COMPARISON:  03/16/2016 FINDINGS: There is a left chest wall port a catheter with tip at the cavoatrial junction. Heart size is normal. There is asymmetric elevation of the right hemidiaphragm. Thickening along the minor fissure is unchanged from previous study. Bilateral pulmonary nodules are again noted. No airspace consolidation. IMPRESSION: 1. No acute cardiopulmonary abnormalities. 2. Bilateral pulmonary nodules compatible with metastatic disease. Similar to previous exam Electronically Signed   By: Kerby Moors M.D.   On: 03/16/2016 18:33   Dg Chest 2 View  Result Date: 03/16/2016 CLINICAL DATA:  Fever, malignant colonic neoplasm EXAM: CHEST  2 VIEW COMPARISON:  01/31/2016 FINDINGS: Cardiomediastinal silhouette is stable. Left subclavian Port-A-Cath with tip in SVC. No pneumothorax. Small fluid noted in right minor fissure. Bilateral  pulmonary nodules are again noted. No segmental infiltrate or pulmonary edema. IMPRESSION: Left subclavian Port-A-Cath with tip in SVC. No pneumothorax. Small fluid noted in right minor fissure. Bilateral pulmonary nodules are again noted. No segmental infiltrate or pulmonary edema. Electronically Signed   By: Lahoma Crocker M.D.   On: 03/16/2016 15:45    Medications: I have reviewed the patient's current medications.  Assessment/Plan:  1. 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 She began adjuvant Xeloda on 05/14/2012. Final cycle of Xeloda started on 10/22/2012.  CT abdomen/pelvis 01/10/2016-innumerable hepatic metastases  CT chest 01/12/2016-multiple pulmonary nodules consistent with metastases  Cycle 1 FOLFOX 02/01/2016  Cycle 2 FOLFOX 02/15/2016  Cycle 3 FOLFOX 02/29/2016  Cycle 4 FOLFOX 03/14/2016 2. Severe microcytic anemia likely secondary to iron deficiency related to bleeding from the colon tumor. The hemoglobin and MCV improved on oral iron. Oral iron was discontinued following office visit 12/16/2012.  Recurrent microcytic anemia July 2017  Stool positive for occult blood 02/01/2016  Referred to GI 3. Diabetes. 4. Hypertension. 5. Hyperlipidemia. 6. History of hand/foot syndrome secondary to Xeloda prompting the Xeloda to be discontinued after day 12, cycle 2. Improved. Xeloda was dose reduced beginning with cycle 3. 7. History of mild diarrhea secondary to Xeloda.  8. History of eye tearing-most likely related to seasonal allergies though this could be a manifestation of Xeloda toxicity.  9. Status post root canal 10/02/2012. 10. Colonoscopy 03/13/2013 by Dr. Amedeo Plenty. Pathology on transverse colon polyp showed a tubular adenoma. Next colonoscopy recommendedat a three-year interval.  11. Anorexia/weight loss 12. Port-A-Cath placement by Dr. Dalbert Batman 01/31/2016 13. Early hand-foot syndrome  secondary to 5-fluorouracil 14. Syncope event 03/16/2016, fever, tachycardia   Judith Stephens was admitted yesterday with fever and tachycardia. She is now day 4 following cycle 4 FOLFOX. She had a syncope event in the early a.m. on 03/16/2016. No apparent source for infection. The Port-A-Cath does not appear clinically infected. The high fever could be related to bacteremia or "tumor fever ". She does not have symptoms to suggest a pulmonary embolism.  Recommendations: 1. Continue antibiotics and follow-up cultures 2. If the fever persists we will need to consider removing the Port-A-Cath or treating for tumor fever.  I appreciate the care from the hospitalist service. I will check on her over the weekend.  LOS: 0 days   Betsy Coder, MD   03/17/2016, 3:51 PM

## 2016-03-17 NOTE — Progress Notes (Signed)
*  PRELIMINARY RESULTS* Echocardiogram 2D Echocardiogram has been performed.  Judith Stephens 03/17/2016, 3:46 PM

## 2016-03-17 NOTE — Progress Notes (Signed)
Patient's vitals were: 102.32F;HR 120;RR22;132/65;98%  Oxygen @ 2 L [place]. PCP on call was notified

## 2016-03-17 NOTE — Progress Notes (Signed)
[facility]. No low BP. Making urine. Fast HR. Checking labs including LA. Giving 1L bolus. Likely high insensible losses. Toradol trial to see if fever can be better controlled. Tylenol no effect per nursing.  Elwin Mocha MD

## 2016-03-17 NOTE — Progress Notes (Signed)
Patient's vitals were: 102.110F;HR 123;RR 24;158/82;100% 2 Liters oxygen on Nasal [place]. Patient also had a short run of SVT, with the rate in the 160s. Patient had 3 blankets on her bed. They have been removed. PCP was notified. Awaiting any new orders.

## 2016-03-17 NOTE — Progress Notes (Signed)
Lactic Acid 2.7 MD texted, awaits orders. SRP, RN

## 2016-03-17 NOTE — Progress Notes (Signed)
PROGRESS NOTE    Judith Stephens  [id number] DOB: 11-03-1940 DOA: 03/16/2016 PCP: Donnie Coffin, MD    Brief Narrative: Judith Stephens is a 75 y.o. female with medical history significant for type 2 diabetes mellitus, hypertension, hypothyroidism, anxiety, and colon cancer status post partial resection who presents to the emergency department at the direction of her oncologist for evaluation of a syncopal episode  And tachycardia.  Assessment & Plan:   Principal Problem:   Sepsis, unspecified organism [facility]) Active Problems:   Normocytic anemia   Colon cancer (HCC)   Diabetes mellitus type II, non insulin dependent (HCC)   HTN (hypertension)   Hyponatremia   Hypokalemia   Liver metastasis (HCC)   Lung metastasis (HCC)   Syncope   Protein-calorie malnutrition, severe   Sepsis from unclear reasons: - improving lactate, resue IV fluids, blood, urine and sputum cultures are pending.  - currently empirically on antibiotics.    Syncope: Secondary to orthostatic hypotension.  Echo done and results pending.    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    Hypokalemia: Repleted.   Hypertension: Well controlled.    Type II diabetes mellitus; CBG (last 3)   Recent Labs  03/16/16 2319 03/17/16 0828 03/17/16 1226  GLUCAP 186* 86 172*    hgab1c is 7.6%.  Resume SSI.    Normocytic anemia: Currently hemoglobin at 8.9, continue to monitor.     DVT prophylaxis: (Lovenox/) Code Status: (Full Family Communication: none at bedside.  Disposition Plan: pending further eval.    Consultants:   None.   Procedures: none.    Antimicrobials: vancomycin and zosyn 9/29   Subjective: Reports feeling better than yesterday.   Objective: Vitals:   03/16/16 2222 03/17/16 0215 03/17/16 0219 03/17/16 0438  BP:  (!) 158/82  115/74  Pulse:  (!) 123 (!) 128 (!) 110  Resp: 16 (!) 24  (!) 22  Temp: 100.3 F  (37.9 C) (!) 102.9 F (39.4 C)  98.6 F (37 C)  TempSrc: Oral Oral  Oral  SpO2: 99% (!) 88% 100% 100%  Weight: 81.8 kg (180 lb 5.4 oz)     Height: 5\' 10"  (1.778 m)       Intake/Output Summary (Last 24 hours) at 03/17/16 1552 Last data filed at 03/17/16 1108  Gross per 24 hour  Intake           903.75 ml  Output              925 ml  Net           -21.25 ml   Filed Weights   03/16/16 2033 03/16/16 2222  Weight: 79.8 kg (176 lb) 81.8 kg (180 lb 5.4 oz)    Examination:  General exam: Appears calm and comfortable on 2 lit of [place] oxygen.  Respiratory system: Clear to auscultation. Respiratory effort normal. Cardiovascular system: S1 & S2 heard, RRR. No JVD, murmurs, rubs, gallops or clicks. No pedal edema. Gastrointestinal system: Abdomen is nondistended, soft and nontender. No organomegaly or masses felt. Normal bowel sounds heard. Central nervous system: Alert and oriented. No focal neurological deficits. Extremities: Symmetric 5 x 5 power. Skin: No rashes, lesions or ulcers Psychiatry: Judgement and insight appear normal. Mood & affect appropriate.     Data Reviewed: I have personally reviewed following labs and imaging studies  CBC:  Recent Labs Lab 03/14/16 0916 03/16/16 1755 03/17/16 0443  WBC 8.3 11.1* 9.2  NEUTROABS 4.8  --   --   HGB 10.2* 10.2* 8.9*  HCT 33.0* 33.3* 28.7*  MCV 81.1 81.8 81.5  PLT 243 239 [id number]   Basic Metabolic Panel:  Recent Labs Lab 03/14/16 0916 03/16/16 1755 03/16/16 2059 03/17/16 0443  NA 141 134*  --  137  K 4.1 3.3*  --  3.2*  CL  --  101  --  107  CO2 25 21*  --  23  GLUCOSE 167* 173*  --  147*  BUN 9.9 14  --  12  CREATININE 0.9 0.87  --  0.71  CALCIUM 10.4 9.7  --  8.7*  MG  --   --  1.8 2.0   GFR: Estimated Creatinine Clearance: 66.7 mL/min (by C-G formula based on SCr of 0.71 mg/dL). Liver Function Tests:  Recent Labs Lab 03/14/16 0916  AST 77*  ALT 10  ALKPHOS 127  BILITOT 0.38  PROT 7.7  ALBUMIN 2.6*    No results for input(s): LIPASE, AMYLASE in the last 168 hours. No results for input(s): AMMONIA in the last 168 hours. Coagulation Profile:  Recent Labs Lab 03/16/16 1755  INR 1.39   Cardiac Enzymes: No results for input(s): CKTOTAL, CKMB, CKMBINDEX, TROPONINI in the last 168 hours. BNP (last 3 results) No results for input(s): PROBNP in the last 8760 hours. HbA1C: No results for input(s): HGBA1C in the last 72 hours. CBG:  Recent Labs Lab 03/16/16 1719 03/16/16 2319 03/17/16 0828 03/17/16 1226  GLUCAP 177* 186* 86 172*   Lipid Profile: No results for input(s): CHOL, HDL, LDLCALC, TRIG, CHOLHDL, LDLDIRECT in the last 72 hours. Thyroid Function Tests: No results for input(s): TSH, T4TOTAL, FREET4, T3FREE, THYROIDAB in the last 72 hours. Anemia Panel: No results for input(s): VITAMINB12, FOLATE, FERRITIN, TIBC, IRON, RETICCTPCT in the last 72 hours. Sepsis Labs:  Recent Labs Lab 03/16/16 1745 03/16/16 1755 03/16/16 2100 03/17/16 1205  PROCALCITON  --  3.56  --   --   LATICACIDVEN 3.8*  --  3.6* 2.7*    Recent Results (from the past 240 hour(s))  Blood culture (routine x 2)     Status: None (Preliminary result)   Collection Time: 03/16/16  5:55 PM  Result Value Ref Range Status   Specimen Description PORTA CATH  Final   Special Requests BOTTLES DRAWN AEROBIC AND ANAEROBIC 5CC  Final   Culture   Final    NO GROWTH < 24 HOURS Performed at [facility]    Report Status PENDING  Incomplete  Blood culture (routine x 2)     Status: None (Preliminary result)   Collection Time: 03/16/16  6:37 PM  Result Value Ref Range Status   Specimen Description RIGHT ANTECUBITAL  Final   Special Requests BOTTLES DRAWN AEROBIC AND ANAEROBIC 5CC  Final   Culture   Final    NO GROWTH < 24 HOURS Performed at [facility]    Report Status PENDING  Incomplete  Culture, sputum-assessment     Status: None   Collection Time: 03/17/16 10:50 AM  Result Value Ref  Range Status   Specimen Description SPUTUM  Final   Special Requests NONE  Final   Sputum evaluation   Final    THIS SPECIMEN IS ACCEPTABLE. RESPIRATORY CULTURE REPORT TO FOLLOW.   Report Status 03/17/2016 FINAL  Final  Culture, respiratory (NON-Expectorated)     Status: None (Preliminary result)   Collection Time: 03/17/16 10:50 AM  Result Value Ref Range Status   Specimen  Description SPUTUM  Final   Special Requests NONE  Final   Gram Stain   Final    FEW WBC PRESENT, PREDOMINANTLY PMN ABUNDANT GRAM POSITIVE COCCI IN CLUSTERS MODERATE GRAM NEGATIVE RODS MODERATE GRAM NEGATIVE COCCOBACILLI FEW GRAM POSITIVE RODS FEW GRAM VARIABLE ROD Performed at [facility]    Culture PENDING  Incomplete   Report Status PENDING  Incomplete         Radiology Studies: Dg Chest 2 View  Result Date: 03/16/2016 CLINICAL DATA:  Tachycardia.  Lower extremity weakness and syncope EXAM: CHEST  2 VIEW COMPARISON:  03/16/2016 FINDINGS: There is a left chest wall port a catheter with tip at the cavoatrial junction. Heart size is normal. There is asymmetric elevation of the right hemidiaphragm. Thickening along the minor fissure is unchanged from previous study. Bilateral pulmonary nodules are again noted. No airspace consolidation. IMPRESSION: 1. No acute cardiopulmonary abnormalities. 2. Bilateral pulmonary nodules compatible with metastatic disease. Similar to previous exam Electronically Signed   By: Kerby Moors M.D.   On: 03/16/2016 18:33   Dg Chest 2 View  Result Date: 03/16/2016 CLINICAL DATA:  Fever, malignant colonic neoplasm EXAM: CHEST  2 VIEW COMPARISON:  01/31/2016 FINDINGS: Cardiomediastinal silhouette is stable. Left subclavian Port-A-Cath with tip in SVC. No pneumothorax. Small fluid noted in right minor fissure. Bilateral pulmonary nodules are again noted. No segmental infiltrate or pulmonary edema. IMPRESSION: Left subclavian Port-A-Cath with tip in SVC. No pneumothorax. Small  fluid noted in right minor fissure. Bilateral pulmonary nodules are again noted. No segmental infiltrate or pulmonary edema. Electronically Signed   By: Lahoma Crocker M.D.   On: 03/16/2016 15:45        Scheduled Meds: . amLODipine  10 mg Oral Daily  . aspirin EC  81 mg Oral Daily  . enoxaparin (LOVENOX) injection  40 mg Subcutaneous QHS  . feeding supplement (ENSURE ENLIVE)  237 mL Oral BID BM  . insulin aspart  0-5 Units Subcutaneous QHS  . insulin aspart  0-9 Units Subcutaneous TID WC  . levothyroxine  150 mcg Oral QAC breakfast  . montelukast  10 mg Oral QPM  . piperacillin-tazobactam (ZOSYN)  IV  3.375 g Intravenous Q8H  . potassium chloride  40 mEq Oral BID  . rosuvastatin  10 mg Oral Q M,W,F  . sodium chloride flush  3 mL Intravenous Q12H  . vancomycin  1,000 mg Intravenous Q12H   Continuous Infusions: . sodium chloride 75 mL/hr at 03/17/16 1111     LOS: 0 days    Time spent: 30 minutes.     Hosie Poisson, MD [facility] Pager [phone]  If 7PM-7AM, please contact night-coverage [email or web address] Password [email or web address] 03/17/2016, 3:52 PM

## 2016-03-17 NOTE — Progress Notes (Signed)
Initial Nutrition Assessment  DOCUMENTATION CODES:   Severe malnutrition in context of chronic illness  INTERVENTION:  Continue Ensure Enlive po BID, each supplement provides 350 kcal and 20 grams of protein  Added note on [facility] to not send cold beverages to patient; only room temperature or warm beverages.   NUTRITION DIAGNOSIS:   Increased nutrient needs related to cancer and cancer related treatments, catabolic illness as evidenced by estimated needs.  GOAL:   Patient will meet greater than or equal to 90% of their needs  MONITOR:   PO intake, Supplement acceptance, Labs, Weight trends, I & O's  REASON FOR ASSESSMENT:   Malnutrition Screening Tool    ASSESSMENT:   75 y.o. female with medical history significant for type 2 diabetes mellitus, hypertension, hypothyroidism, anxiety, and colon cancer (metastatic to liver and lung) status post partial resection who presents to the emergency department at the direction of her oncologist for evaluation of a syncopal episode earlier in the day and tachycardia. Patient is under the care of Dr. Benay Spice of oncology and receiving treatment with FOLFOX, last infusion 03/14/2016. Pt met criteria for sepsis upon admission - cultures pending.   Of note, patient has been seen by Dory Peru, RD at cancer center for weight loss/malnutrition. She has counseled pt on eating adequate protein and calories and recommended drinking Ensure or Boost daily.   Patient reports she started losing weight in May 2017. Her UBW was 210 lbs. Per chart, she has lost 15.65 kg (16% body weight) over 5 months. This is significant for this time frame. She reports her intake had been lower due to poor appetite (50-75% of usual intake for a few months), but after seeing Barb she had been able to increase intake for a while. During this admission, appetite is poor again.    Typical Intake: Breakfast - cereal with milk Lunch - sandwich or salad Dinner - reports  it is different daily, but a "full meal"  Medications reviewed and include: Novolog sliding scale TID with meals and daily at bedtime, potassium chloride 40 mEq BID, NS @ 75 ml/hr. Patient already ordered for Ensure Enlive BID per protocol.  Labs reviewed: CBG 86-186, Potassium 3.2, HgbA1c 7.6 on 01/28/2016.   Nutrition-Focused physical exam completed. Findings are no fat depletion, no muscle depletion, and no edema. Patient reports she can notice loss of muscle and fat in arms. Depletion likely not noticeable to RD since patient remains overweight per BMI and would have been classified as obese prior to the significant weight loss.   Discussed plan with RN.   Diet Order:  Diet regular Room service appropriate? Yes; Fluid consistency: Thin  Skin:  Reviewed, no issues  Last BM:  PTA  Height:   Ht Readings from Last 1 Encounters:  03/16/16 '5\' 10"'$  (1.778 m)    Weight:   Wt Readings from Last 1 Encounters:  03/16/16 180 lb 5.4 oz (81.8 kg)    Ideal Body Weight:  68.18 kg  BMI:  Body mass index is 25.88 kg/m.  Estimated Nutritional Needs:   Kcal:  4627-0350  Protein:  100-125 grams  Fluid:  >/= 2.5 L/day  EDUCATION NEEDS:   Education needs addressed (Small, frequent meals. Adequate protein and calories at meals. )  Willey Blade, MS, RD, LDN Pager: [phone] After Hours Pager: [phone]

## 2016-03-17 NOTE — Progress Notes (Signed)
Spoke with MD concerning Lactic Acid, not new orders at this time. SRP, RN

## 2016-03-18 LAB — CBC
HEMATOCRIT: 26.7 % — AB (ref 36.0–46.0)
HEMOGLOBIN: 8.3 g/dL — AB (ref 12.0–15.0)
MCH: 24.7 pg — ABNORMAL LOW (ref 26.0–34.0)
MCHC: 31.1 g/dL (ref 30.0–36.0)
MCV: 79.5 fL (ref 78.0–100.0)
Platelets: 128 10*3/uL — ABNORMAL LOW (ref 150–400)
RBC: 3.36 MIL/uL — AB (ref 3.87–5.11)
RDW: 19.7 % — AB (ref 11.5–15.5)
WBC: 6.1 10*3/uL (ref 4.0–10.5)

## 2016-03-18 LAB — LACTIC ACID, PLASMA
LACTIC ACID, VENOUS: 2.2 mmol/L — AB (ref 0.5–1.9)
LACTIC ACID, VENOUS: 2.5 mmol/L — AB (ref 0.5–1.9)

## 2016-03-18 LAB — BASIC METABOLIC PANEL
ANION GAP: 4 — AB (ref 5–15)
Anion gap: 4 — ABNORMAL LOW (ref 5–15)
BUN: 12 mg/dL (ref 6–20)
BUN: 13 mg/dL (ref 6–20)
CALCIUM: 8.5 mg/dL — AB (ref 8.9–10.3)
CALCIUM: 8.5 mg/dL — AB (ref 8.9–10.3)
CHLORIDE: 113 mmol/L — AB (ref 101–111)
CO2: 22 mmol/L (ref 22–32)
CO2: 22 mmol/L (ref 22–32)
CREATININE: 0.67 mg/dL (ref 0.44–1.00)
Chloride: 109 mmol/L (ref 101–111)
Creatinine, Ser: 0.65 mg/dL (ref 0.44–1.00)
GFR calc non Af Amer: >60 mL/min (ref >60)
GLUCOSE: 151 mg/dL — AB (ref 65–99)
Glucose, Bld: 121 mg/dL — ABNORMAL HIGH (ref 65–99)
POTASSIUM: 3.6 mmol/L (ref 3.5–5.1)
Potassium: 3.4 mmol/L — ABNORMAL LOW (ref 3.5–5.1)
Sodium: 135 mmol/L (ref 135–145)
Sodium: 139 mmol/L (ref 135–145)

## 2016-03-18 LAB — TROPONIN I: Troponin I: <0.03 ng/mL (ref <0.03)

## 2016-03-18 LAB — URINE CULTURE

## 2016-03-18 LAB — MAGNESIUM: MAGNESIUM: 2 mg/dL (ref 1.7–2.4)

## 2016-03-18 LAB — GLUCOSE, CAPILLARY
GLUCOSE-CAPILLARY: 206 mg/dL — AB (ref 65–99)
GLUCOSE-CAPILLARY: 134 mg/dL — AB (ref 65–99)
Glucose-Capillary: 105 mg/dL — ABNORMAL HIGH (ref 65–99)
Glucose-Capillary: 165 mg/dL — ABNORMAL HIGH (ref 65–99)

## 2016-03-18 LAB — PHOSPHORUS: PHOSPHORUS: 1.1 mg/dL — AB (ref 2.5–4.6)

## 2016-03-18 MED ORDER — POTASSIUM & SODIUM PHOSPHATES 280-160-250 MG PO PACK
1.0000 | PACK | Freq: Three times a day (TID) | ORAL | Status: DC
  Administered 2016-03-18 – 2016-03-19 (×5): 1 via ORAL
  Filled 2016-03-18 (×6): qty 1

## 2016-03-18 MED ORDER — SODIUM PHOSPHATES 45 MMOLE/15ML IV SOLN
30.0000 mmol | Freq: Once | INTRAVENOUS | Status: AC
  Administered 2016-03-18: 30 mmol via INTRAVENOUS
  Filled 2016-03-18: qty 10

## 2016-03-18 MED ORDER — NAPROXEN 250 MG PO TABS
250.0000 mg | ORAL_TABLET | Freq: Two times a day (BID) | ORAL | Status: DC
Start: 2016-03-18 — End: 2016-03-19
  Administered 2016-03-18: 250 mg via ORAL
  Filled 2016-03-18 (×2): qty 1

## 2016-03-18 NOTE — Progress Notes (Signed)
Notified MD of Lactic acid 2.5. SRP, RN

## 2016-03-18 NOTE — Progress Notes (Signed)
Lactic Acid was 2.2 PCP to be notified

## 2016-03-18 NOTE — Progress Notes (Signed)
PROGRESS NOTE    [name]  [id number] DOB: 07/23/1940 DOA: 03/16/2016 PCP: Donnie Coffin, MD    Brief Narrative: Judith Stephens is a 75 y.o. female with medical history significant for type 2 diabetes mellitus, hypertension, hypothyroidism, anxiety, and colon cancer status post partial resection who presents to the emergency department at the direction of her oncologist for evaluation of a syncopal episode  And tachycardia.  Assessment & Plan:   Principal Problem:   Sepsis, unspecified organism [facility]) Active Problems:   Normocytic anemia   Colon cancer (HCC)   Diabetes mellitus type II, non insulin dependent (HCC)   HTN (hypertension)   Hyponatremia   Hypokalemia   Liver metastasis (HCC)   Lung metastasis (HCC)   Syncope   Protein-calorie malnutrition, severe   Sepsis from unclear reasons: - improving lactate, resume IV fluids, blood, urine and sputum cultures are pending.  - currently empirically on antibiotics.  Febrile overnight, but pt appears clinically better.  Suspect the fevers from tumour burden if her cultures remain negative.    Syncope: Secondary to orthostatic hypotension.     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    Hypokalemia: Repleted.   Hypertension: Well controlled.   Hypophosphatemia: Replete as needed.    Type II diabetes mellitus; CBG (last 3)   Recent Labs  03/18/16 0744 03/18/16 1235 03/18/16 1739  GLUCAP 105* 206* 165*    hgab1c is 7.6%.  Resume SSI.    Normocytic anemia: Currently hemoglobin at 8.9, continue to monitor.     DVT prophylaxis: (Lovenox/) Code Status: (Full Family Communication: none at bedside.  Disposition Plan: pending further eval.    Consultants:   None.   Procedures: none.    Antimicrobials: vancomycin and zosyn 9/29   Subjective: Reports feeling better than yesterday.   Objective: Vitals:   03/17/16 2147  03/17/16 2356 03/18/16 0352 03/18/16 1504  BP: 132/65 (!) 144/87 113/77 119/70  Pulse: (!) 120 (!) 109 90 85  Resp: (!) 22 18 18 16   Temp: (!) 102.5 F (39.2 C) 99.8 F (37.7 C) 97.7 F (36.5 C) 97.5 F (36.4 C)  TempSrc: Oral Oral Oral Oral  SpO2: 98% 100% 100% 100%  Weight:      Height:        Intake/Output Summary (Last 24 hours) at 03/18/16 1800 Last data filed at 03/18/16 1340  Gross per 24 hour  Intake           2372.5 ml  Output             1300 ml  Net           1072.5 ml   Filed Weights   03/16/16 2033 03/16/16 2222  Weight: 79.8 kg (176 lb) 81.8 kg (180 lb 5.4 oz)    Examination:  General exam: Appears calm and comfortable on 2 lit of [place] oxygen.  Respiratory system: Clear to auscultation. Respiratory effort normal. Cardiovascular system: S1 & S2 heard, RRR. No JVD, murmurs, rubs, gallops or clicks. No pedal edema. Gastrointestinal system: Abdomen is nondistended, soft and nontender. No organomegaly or masses felt. Normal bowel sounds heard. Central nervous system: Alert and oriented. No focal neurological deficits. Extremities: Symmetric 5 x 5 power. Skin: No rashes, lesions or ulcers Psychiatry: Judgement and insight appear normal. Mood & affect appropriate.     Data Reviewed: I have personally reviewed following labs and imaging studies  CBC:  Recent Labs  Lab 03/14/16 0916 03/16/16 1755 03/17/16 0443 03/18/16 0430  WBC 8.3 11.1* 9.2 6.1  NEUTROABS 4.8  --   --   --   HGB 10.2* 10.2* 8.9* 8.3*  HCT 33.0* 33.3* 28.7* 26.7*  MCV 81.1 81.8 81.5 79.5  PLT 243 239 163 0000000*   Basic Metabolic Panel:  Recent Labs Lab 03/14/16 0916 03/16/16 1755 03/16/16 2059 03/17/16 0443 03/18/16 0030 03/18/16 0430  NA 141 134*  --  137 135 139  K 4.1 3.3*  --  3.2* 3.4* 3.6  CL  --  101  --  107 109 113*  CO2 25 21*  --  23 22 22   GLUCOSE 167* 173*  --  147* 151* 121*  BUN 9.9 14  --  12 12 13   CREATININE 0.9 0.87  --  0.71 0.67 0.65  CALCIUM 10.4 9.7  --   8.7* 8.5* 8.5*  MG  --   --  1.8 2.0 2.0  --   PHOS  --   --   --   --  1.1*  --    GFR: Estimated Creatinine Clearance: 66.7 mL/min (by C-G formula based on SCr of 0.65 mg/dL). Liver Function Tests:  Recent Labs Lab 03/14/16 0916  AST 77*  ALT 10  ALKPHOS 127  BILITOT 0.38  PROT 7.7  ALBUMIN 2.6*   No results for input(s): LIPASE, AMYLASE in the last 168 hours. No results for input(s): AMMONIA in the last 168 hours. Coagulation Profile:  Recent Labs Lab 03/16/16 1755  INR 1.39   Cardiac Enzymes:  Recent Labs Lab 03/18/16 0030  TROPONINI <0.03   BNP (last 3 results) No results for input(s): PROBNP in the last 8760 hours. HbA1C: No results for input(s): HGBA1C in the last 72 hours. CBG:  Recent Labs Lab 03/17/16 1226 03/17/16 2237 03/18/16 0744 03/18/16 1235 03/18/16 1739  GLUCAP 172* 118* 105* 206* 165*   Lipid Profile: No results for input(s): CHOL, HDL, LDLCALC, TRIG, CHOLHDL, LDLDIRECT in the last 72 hours. Thyroid Function Tests: No results for input(s): TSH, T4TOTAL, FREET4, T3FREE, THYROIDAB in the last 72 hours. Anemia Panel: No results for input(s): VITAMINB12, FOLATE, FERRITIN, TIBC, IRON, RETICCTPCT in the last 72 hours. Sepsis Labs:  Recent Labs Lab 03/16/16 1755 03/16/16 2100 03/17/16 1205 03/18/16 0030 03/18/16 1215  PROCALCITON 3.56  --   --   --   --   LATICACIDVEN  --  3.6* 2.7* 2.2* 2.5*    Recent Results (from the past 240 hour(s))  Urine culture     Status: Abnormal   Collection Time: 03/16/16  5:15 PM  Result Value Ref Range Status   Specimen Description URINE, RANDOM  Final   Special Requests NONE  Final   Culture (A)  Final    <10,000 COLONIES/mL INSIGNIFICANT GROWTH Performed at [facility]    Report Status 03/18/2016 FINAL  Final  Blood culture (routine x 2)     Status: None (Preliminary result)   Collection Time: 03/16/16  5:55 PM  Result Value Ref Range Status   Specimen Description PORTA CATH  Final     Special Requests BOTTLES DRAWN AEROBIC AND ANAEROBIC 5CC  Final   Culture   Final    NO GROWTH 2 DAYS Performed at [facility]    Report Status PENDING  Incomplete  Blood culture (routine x 2)     Status: None (Preliminary result)   Collection Time: 03/16/16  6:37 PM  Result Value Ref Range Status  Specimen Description RIGHT ANTECUBITAL  Final   Special Requests BOTTLES DRAWN AEROBIC AND ANAEROBIC 5CC  Final   Culture   Final    NO GROWTH 2 DAYS Performed at [facility]    Report Status PENDING  Incomplete  Culture, sputum-assessment     Status: None   Collection Time: 03/17/16 10:50 AM  Result Value Ref Range Status   Specimen Description SPUTUM  Final   Special Requests NONE  Final   Sputum evaluation   Final    THIS SPECIMEN IS ACCEPTABLE. RESPIRATORY CULTURE REPORT TO FOLLOW.   Report Status 03/17/2016 FINAL  Final  Culture, respiratory (NON-Expectorated)     Status: None (Preliminary result)   Collection Time: 03/17/16 10:50 AM  Result Value Ref Range Status   Specimen Description SPUTUM  Final   Special Requests NONE  Final   Gram Stain   Final    FEW WBC PRESENT, PREDOMINANTLY PMN ABUNDANT GRAM POSITIVE COCCI IN CLUSTERS MODERATE GRAM NEGATIVE RODS MODERATE GRAM NEGATIVE COCCOBACILLI FEW GRAM POSITIVE RODS FEW GRAM VARIABLE ROD    Culture   Final    CULTURE REINCUBATED FOR BETTER GROWTH Performed at [facility]    Report Status PENDING  Incomplete         Radiology Studies: Dg Chest 2 View  Result Date: 03/16/2016 CLINICAL DATA:  Tachycardia.  Lower extremity weakness and syncope EXAM: CHEST  2 VIEW COMPARISON:  03/16/2016 FINDINGS: There is a left chest wall port a catheter with tip at the cavoatrial junction. Heart size is normal. There is asymmetric elevation of the right hemidiaphragm. Thickening along the minor fissure is unchanged from previous study. Bilateral pulmonary nodules are again noted. No airspace consolidation.  IMPRESSION: 1. No acute cardiopulmonary abnormalities. 2. Bilateral pulmonary nodules compatible with metastatic disease. Similar to previous exam Electronically Signed   By: Kerby Moors M.D.   On: 03/16/2016 18:33        Scheduled Meds: . amLODipine  10 mg Oral Daily  . aspirin EC  81 mg Oral Daily  . enoxaparin (LOVENOX) injection  40 mg Subcutaneous QHS  . feeding supplement (ENSURE ENLIVE)  237 mL Oral BID BM  . insulin aspart  0-5 Units Subcutaneous QHS  . insulin aspart  0-9 Units Subcutaneous TID WC  . levothyroxine  150 mcg Oral QAC breakfast  . montelukast  10 mg Oral QPM  . naproxen  250 mg Oral BID WC  . piperacillin-tazobactam (ZOSYN)  IV  3.375 g Intravenous Q8H  . potassium & sodium phosphates  1 packet Oral TID WC & HS  . rosuvastatin  10 mg Oral Q M,W,F  . sodium chloride flush  3 mL Intravenous Q12H  . sodium phosphate  Dextrose 5% IVPB  30 mmol Intravenous Once  . vancomycin  1,000 mg Intravenous Q12H   Continuous Infusions: . sodium chloride 75 mL/hr at 03/18/16 1219     LOS: 1 day    Time spent: 30 minutes.     Hosie Poisson, MD [facility] Pager [phone]  If 7PM-7AM, please contact night-coverage [email or web address] Password [email or web address] 03/18/2016, 6:00 PM

## 2016-03-18 NOTE — Progress Notes (Signed)
IP PROGRESS NOTE  Subjective: She reports feeling stronger. She had a high fever last night.    Objective: Vital signs in last 24 hours: Blood pressure 113/77, pulse 90, temperature 97.7 F (36.5 C), temperature source Oral, resp. rate 18, height 5\' 10"  (1.778 m), weight 180 lb 5.4 oz (81.8 kg), SpO2 100 %.  Intake/Output from previous day: 09/29 0701 - 09/30 0700 In: 2672.5 [P.O.:660; I.V.:1662.5; IV Piggyback:350] Out: 550 [Urine:550]  Physical Exam:  HEENT: No thrush Lungs: Clear bilaterally Cardiac: Regular rate and rhythm Abdomen: The liver is palpable in the mid upper abdomen Extremities: No leg edema  Portacath/PICC-without erythema or tenderness  Lab Results:  Recent Labs  03/17/16 0443 03/18/16 0430  WBC 9.2 6.1  HGB 8.9* 8.3*  HCT 28.7* 26.7*  PLT 163 128*    BMET  Recent Labs  03/18/16 0030 03/18/16 0430  NA 135 139  K 3.4* 3.6  CL 109 113*  CO2 22 22  GLUCOSE 151* 121*  BUN 12 13  CREATININE 0.67 0.65  CALCIUM 8.5* 8.5*    Studies/Results: Dg Chest 2 View  Result Date: 03/16/2016 CLINICAL DATA:  Tachycardia.  Lower extremity weakness and syncope EXAM: CHEST  2 VIEW COMPARISON:  03/16/2016 FINDINGS: There is a left chest wall port a catheter with tip at the cavoatrial junction. Heart size is normal. There is asymmetric elevation of the right hemidiaphragm. Thickening along the minor fissure is unchanged from previous study. Bilateral pulmonary nodules are again noted. No airspace consolidation. IMPRESSION: 1. No acute cardiopulmonary abnormalities. 2. Bilateral pulmonary nodules compatible with metastatic disease. Similar to previous exam Electronically Signed   By: Kerby Moors M.D.   On: 03/16/2016 18:33   Dg Chest 2 View  Result Date: 03/16/2016 CLINICAL DATA:  Fever, malignant colonic neoplasm EXAM: CHEST  2 VIEW COMPARISON:  01/31/2016 FINDINGS: Cardiomediastinal silhouette is stable. Left subclavian Port-A-Cath with tip in SVC. No  pneumothorax. Small fluid noted in right minor fissure. Bilateral pulmonary nodules are again noted. No segmental infiltrate or pulmonary edema. IMPRESSION: Left subclavian Port-A-Cath with tip in SVC. No pneumothorax. Small fluid noted in right minor fissure. Bilateral pulmonary nodules are again noted. No segmental infiltrate or pulmonary edema. Electronically Signed   By: Lahoma Crocker M.D.   On: 03/16/2016 15:45    Medications: I have reviewed the patient's current medications.  Assessment/Plan:  1. 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 She began adjuvant Xeloda on 05/14/2012. Final cycle of Xeloda started on 10/22/2012.  CT abdomen/pelvis 01/10/2016-innumerable hepatic metastases  CT chest 01/12/2016-multiple pulmonary nodules consistent with metastases  Cycle 1 FOLFOX 02/01/2016  Cycle 2 FOLFOX 02/15/2016  Cycle 3 FOLFOX 02/29/2016  Cycle 4 FOLFOX 03/14/2016 2. Severe microcytic anemia likely secondary to iron deficiency related to bleeding from the colon tumor. The hemoglobin and MCV improved on oral iron. Oral iron was discontinued following office visit 12/16/2012.  Recurrent microcytic anemia July 2017  Stool positive for occult blood 02/01/2016  Referred to GI 3. Diabetes. 4. Hypertension. 5. Hyperlipidemia. 6. History of hand/foot syndrome secondary to Xeloda prompting the Xeloda to be discontinued after day 12, cycle 2. Improved. Xeloda was dose reduced beginning with cycle 3. 7. History of mild diarrhea secondary to Xeloda.  8. History of eye tearing-most likely related to seasonal allergies though this could be a manifestation of Xeloda toxicity.  9. Status post root canal 10/02/2012. 10. Colonoscopy 03/13/2013 by Dr. Amedeo Plenty. Pathology on transverse colon  polyp showed a tubular adenoma. Next colonoscopy recommendedat a three-year interval. 11. Anorexia/weight loss 12. Port-A-Cath  placement by Dr. Dalbert Batman 01/31/2016 13. 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 Syncope event 03/16/2016, fever, tachycardia   Ms. Dipinto had a high fever again last night. No apparent source for infection. Cultures remain negative. She otherwise appears well. The fever may be related to "tumor fever "in the setting of extensive liver metastases.  I recommend a trial of naproxen to suppress the fever.  She may be ready for discharge 03/19/2016 if the cultures remain negative and the fever improves.     LOS: 1 day   Betsy Coder, MD   03/18/2016, 9:08 AM

## 2016-03-18 NOTE — Evaluation (Addendum)
Physical Therapy Evaluation Patient Details Name: Judith Stephens MRN: [id number] DOB: [birth year] Today's Date: 03/18/2016   History of Present Illness  75 yo female admitted 03/16/16 with syncope, increased HR,sepsis,  met liver disease. Hx of HTN, DM  Clinical Impression  The patient  Ambulated pushing IV pole. Feel she currently requires UE support. Will assess if she will need a RW next visit. Pt admitted with above diagnosis. Pt currently with functional limitations due to the deficits listed below (see PT Problem List).  Pt will benefit from skilled PT to increase their independence and safety with mobility to allow discharge to the venue listed below.          Follow Up Recommendations If decreased balance noted, may v=benefit from [place] Recommendations   (may need one, TBA)    Recommendations for Other Services       Precautions / Restrictions Precautions Precautions: Fall Precaution Comments: monitor HR      Mobility  Bed Mobility Overal bed mobility: Independent                Transfers Overall transfer level: Needs assistance Equipment used: None Transfers: Sit to/from Stand Sit to Stand: Supervision            Ambulation/Gait Ambulation/Gait assistance: Min guard Ambulation Distance (Feet): 440 Feet   Gait Pattern/deviations: Step-through pattern Gait velocity: decr   General Gait Details: held onto IV pole, stop for 1 standing rest break  Stairs            Wheelchair Mobility    Modified Rankin (Stroke Patients Only)       Balance Overall balance assessment: Needs assistance Sitting-balance support: Feet supported;No upper extremity supported Sitting balance-Leahy Scale: Good     Standing balance support: During functional activity;No upper extremity supported Standing balance-Leahy Scale: Fair                               Pertinent Vitals/Pain Pain Assessment: No/denies pain    Home Living  Family/patient expects to be discharged to:: Private residence Living Arrangements: Spouse/significant other Available Help at Discharge: Family Type of Home: House Home Access: Stairs to [employer] entry     Home Layout: Two level;Bed/bath upstairs Home Equipment: None      Prior Function Level of Independence: Independent               Hand Dominance        Extremity/Trunk Assessment   Upper Extremity Assessment: Generalized weakness           Lower Extremity Assessment: Generalized weakness      Cervical / Trunk Assessment: Normal  Communication      Cognition Arousal/Alertness: Awake/alert Behavior During Therapy: WFL for tasks assessed/performed Overall Cognitive Status: Within Functional Limits for tasks assessed                      General Comments      Exercises     Assessment/Plan    PT Assessment Patient needs continued PT services  PT Problem List Decreased activity tolerance;Decreased balance;Decreased mobility          PT Treatment Interventions DME instruction;Gait training;Stair training;Functional mobility training;Therapeutic activities;Patient/family education    PT Goals (Current goals can be found in the Care Plan section)  Acute Rehab PT Goals Patient Stated Goal: to return home PT Goal Formulation: With patient Time For Goal Achievement: 03/25/16  Potential to Achieve Goals: Good    Frequency Min 3X/week   Barriers to discharge        Co-evaluation               End of Session Equipment Utilized During Treatment: Gait belt Activity Tolerance: Patient tolerated treatment well Patient left: in bed;with call [place]/phone within reach;with family/visitor present Nurse Communication: Mobility status         Time: 4585-9292 PT Time Calculation (min) (ACUTE ONLY): 16 min   Charges:   PT Evaluation $PT Eval Low Complexity: 1 Procedure     PT G CodesClaretha Cooper 03/18/2016,  3:10 PM Tresa Endo PT [phone]

## 2016-03-19 LAB — GLUCOSE, CAPILLARY
Glucose-Capillary: 106 mg/dL — ABNORMAL HIGH (ref 65–99)
Glucose-Capillary: 113 mg/dL — ABNORMAL HIGH (ref 65–99)

## 2016-03-19 MED ORDER — ENSURE ENLIVE PO LIQD: 237.0000 mL | Freq: Two times a day (BID) | ORAL | 12 refills | Status: DC

## 2016-03-19 MED ORDER — HEPARIN SOD (PORK) LOCK FLUSH 100 UNIT/ML IV SOLN
500.0000 [IU] | INTRAVENOUS | Status: AC | PRN
  Administered 2016-03-19: 500 [IU]

## 2016-03-19 NOTE — Progress Notes (Signed)
Completed D/C teaching. Answered questions. Patient will be sent home with family in stable condition.

## 2016-03-19 NOTE — Progress Notes (Signed)
IP PROGRESS NOTE  Subjective: No complaint. She was able to ambulate yesterday.    Objective: Vital signs in last 24 hours: Blood pressure 129/65, pulse 65, temperature 97.5 F (36.4 C), temperature source Oral, resp. rate 18, height 5\' 10"  (1.778 m), weight 180 lb 5.4 oz (81.8 kg), SpO2 100 %.  Intake/Output from previous day: 09/30 0701 - 10/01 0700 In: 3802.5 [P.O.:840; I.V.:2162.5; IV Piggyback:800] Out: 900 [Urine:900]  Physical Exam:   Abdomen: The liver is palpable in the mid upper abdomen with associated tenderness   Portacath/PICC-without erythema or tenderness  Lab Results:  Recent Labs  03/17/16 0443 03/18/16 0430  WBC 9.2 6.1  HGB 8.9* 8.3*  HCT 28.7* 26.7*  PLT 163 128*    BMET  Recent Labs  03/18/16 0030 03/18/16 0430  NA 135 139  K 3.4* 3.6  CL 109 113*  CO2 22 22  GLUCOSE 151* 121*  BUN 12 13  CREATININE 0.67 0.65  CALCIUM 8.5* 8.5*     Medications: I have reviewed the patient's current medications.  Assessment/Plan:  1. 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 She began adjuvant Xeloda on 05/14/2012. Final cycle of Xeloda started on 10/22/2012.  CT abdomen/pelvis 01/10/2016-innumerable hepatic metastases  CT chest 01/12/2016-multiple pulmonary nodules consistent with metastases  Cycle 1 FOLFOX 02/01/2016  Cycle 2 FOLFOX 02/15/2016  Cycle 3 FOLFOX 02/29/2016  Cycle 4 FOLFOX 03/14/2016 2. Severe microcytic anemia likely secondary to iron deficiency related to bleeding from the colon tumor. The hemoglobin and MCV improved on oral iron. Oral iron was discontinued following office visit 12/16/2012.  Recurrent microcytic anemia July 2017  Stool positive for occult blood 02/01/2016  Referred to GI 3. Diabetes. 4. Hypertension. 5. Hyperlipidemia. 6. History of hand/foot syndrome secondary to Xeloda prompting the Xeloda to be discontinued after  day 12, cycle 2. Improved. Xeloda was dose reduced beginning with cycle 3. 7. History of mild diarrhea secondary to Xeloda.  8. History of eye tearing-most likely related to seasonal allergies though this could be a manifestation of Xeloda toxicity.  9. Status post root canal 10/02/2012. 10. Colonoscopy 03/13/2013 by Dr. Amedeo Plenty. Pathology on transverse colon polyp showed a tubular adenoma. Next colonoscopy recommendedat a three-year interval. 11. Anorexia/weight loss 12. Port-A-Cath placement by Dr. Dalbert Batman 01/31/2016 13. 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 Syncope event 03/16/2016, fever, tachycardia   Ms. Hamor has been afebrile for the past 24 hours. She appears well. Cultures remain negative. She appears stable for discharge from an oncology standpoint. I will discontinue the Naprosyn. She had a recent Hemoccult positive stool and has been referred to GI. She can take Tylenol prophylactically for recurrent fever.  Ms. Holliday will follow-up as scheduled at the [facility].      LOS: 2 days   Betsy Coder, MD   03/19/2016, 8:13 AM

## 2016-03-20 ENCOUNTER — Telehealth: Payer: Self-pay | Admitting: *Deleted

## 2016-03-20 LAB — CULTURE, RESPIRATORY W GRAM STAIN: Culture: NORMAL

## 2016-03-20 LAB — CULTURE, RESPIRATORY

## 2016-03-20 LAB — GLUCOSE, CAPILLARY: GLUCOSE-CAPILLARY: 143 mg/dL — AB (ref 65–99)

## 2016-03-20 NOTE — Telephone Encounter (Signed)
Judith Stephens 'My husband said someone called about an appointment there tomorrow.  I was just discharged from the hospital yesterday.  What is this about."  Advised tomorrow's appointment is with [facility] Dr. Amedeo Plenty at 2:30.  Next F/U ay [place] is October 10th beginning at 8:15 am.  "I will call to reschedule.  I'm tired and do not feel like going tomorrow."

## 2016-03-20 NOTE — Discharge Summary (Addendum)
bPhysician Discharge Summary  Judith Stephens [id number] DOB: 1941-03-13 DOA: 03/16/2016  PCP: Donnie Coffin, MD  Admit date: 03/16/2016 Discharge date: 03/19/2016  Admitted From: [facility].  Disposition:  Home.   Recommendations for Outpatient Follow-up:  1. Follow up with PCP in 1-2 weeks 2. Please obtain BMP/CBC in one week 3. Please follow up on the blood cultures report, and follow up with oncology as recommended.    Discharge Condition:stable CODE STATUS:full code.  Diet recommendation:  Regular   Brief/Interim Summary:  Judith Stephens a 75 y.o.femalewith medical history significant for type 2 diabetes mellitus, hypertension, hypothyroidism, anxiety, and colon cancer status post partial resection who presents to the emergency department at the direction of her oncologist for evaluation of a syncopal episode  And tachycardia.   Discharge Diagnoses:  Principal Problem:   Sepsis, unspecified organism [facility]) Active Problems:   Normocytic anemia   Colon cancer ([place])   Diabetes mellitus type II, non insulin dependent ([place])   HTN (hypertension)   Hyponatremia   Hypokalemia   Liver metastasis (HCC)   Lung metastasis (HCC)   Syncope   Protein-calorie malnutrition, severe  Sepsis ruled out, probably from the tumour burden.  Cultures are negative so far, she was started on antibiotics empirically. Discontinued on discharge.    Syncope: Secondary to orthostatic hypotension.  Repeat orthostatics are negative.     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    Hypokalemia: Repleted.   Hypertension: Well controlled.   Hypophosphatemia: Replete as needed.    Type II diabetes mellitus; CBG (last 3)   Recent Labs (last 2 labs)    Recent Labs  03/18/16 0744 03/18/16 1235 03/18/16 1739  GLUCAP 105* 206* 165*      hgab1c is 7.6%.  Resume SSI.    Normocytic anemia: Currently  hemoglobin at 8.9,   Discharge Instructions  Discharge Instructions    Diet general    Complete by:  As directed    Discharge instructions    Complete by:  As directed    Please follow up with Dr Learta Codding as recommended.       Medication List    TAKE these medications   amLODipine 10 MG tablet Commonly known as:  NORVASC Take 10 mg by mouth daily.   aspirin EC 81 MG tablet Take 81 mg by mouth daily.   feeding supplement (ENSURE ENLIVE) Liqd Take 237 mLs by mouth 2 (two) times daily between meals.   fluticasone 50 MCG/ACT nasal spray Commonly known as:  FLONASE Place 2 sprays into the nose daily as needed for rhinitis. For allergies   glipiZIDE 10 MG tablet Commonly known as:  GLUCOTROL Take 10 mg by mouth daily.   HYDROcodone-acetaminophen 5-325 MG tablet Commonly known as:  NORCO/VICODIN Take 1-2 tablets by mouth every 4 (four) hours as needed for moderate pain or severe pain.   levothyroxine 150 MCG tablet Commonly known as:  SYNTHROID, LEVOTHROID Take 150 mcg by mouth daily before breakfast.   lidocaine-prilocaine cream Commonly known as:  EMLA Apply 1 application topically as needed. Apply 1 hr prior to port access and cover with plastic wrap   loratadine 10 MG tablet Commonly known as:  CLARITIN Take 10 mg by mouth daily as needed for allergies.   LORazepam 0.5 MG tablet Commonly known as:  ATIVAN Take 1 tablet (0.5 mg total) by mouth at bedtime as needed for anxiety.   montelukast 10 MG tablet  Commonly known as:  SINGULAIR Take 1 tablet (10 mg total) by mouth every evening.   ondansetron 8 MG tablet Commonly known as:  ZOFRAN Take 1 tablet (8 mg total) by mouth every 8 (eight) hours as needed for nausea or vomiting.   prochlorperazine 5 MG tablet Commonly known as:  COMPAZINE Take 1 tablet (5 mg total) by mouth every 6 (six) hours as needed for nausea or vomiting.   rosuvastatin 10 MG tablet Commonly known as:  CRESTOR Take 10 mg by mouth  every Monday, Wednesday, and Friday.       Allergies  Allergen Reactions  . No Known Allergies Other (See Comments)    Consultations:  oncology   Procedures/Studies: Dg Chest 2 View  Result Date: 03/16/2016 CLINICAL DATA:  Tachycardia.  Lower extremity weakness and syncope EXAM: CHEST  2 VIEW COMPARISON:  03/16/2016 FINDINGS: There is a left chest wall port a catheter with tip at the cavoatrial junction. Heart size is normal. There is asymmetric elevation of the right hemidiaphragm. Thickening along the minor fissure is unchanged from previous study. Bilateral pulmonary nodules are again noted. No airspace consolidation. IMPRESSION: 1. No acute cardiopulmonary abnormalities. 2. Bilateral pulmonary nodules compatible with metastatic disease. Similar to previous exam Electronically Signed   By: Kerby Moors M.D.   On: 03/16/2016 18:33   Dg Chest 2 View  Result Date: 03/16/2016 CLINICAL DATA:  Fever, malignant colonic neoplasm EXAM: CHEST  2 VIEW COMPARISON:  01/31/2016 FINDINGS: Cardiomediastinal silhouette is stable. Left subclavian Port-A-Cath with tip in SVC. No pneumothorax. Small fluid noted in right minor fissure. Bilateral pulmonary nodules are again noted. No segmental infiltrate or pulmonary edema. IMPRESSION: Left subclavian Port-A-Cath with tip in SVC. No pneumothorax. Small fluid noted in right minor fissure. Bilateral pulmonary nodules are again noted. No segmental infiltrate or pulmonary edema. Electronically Signed   By: Lahoma Crocker M.D.   On: 03/16/2016 15:45       Subjective: Reports feeling better,   Discharge Exam: Vitals:   03/18/16 2107 03/19/16 0455  BP: 133/71 129/65  Pulse: 87 65  Resp: 18 18  Temp: 97.9 F (36.6 C) 97.5 F (36.4 C)   Vitals:   03/18/16 0352 03/18/16 1504 03/18/16 2107 03/19/16 0455  BP: 113/77 119/70 133/71 129/65  Pulse: 90 85 87 65  Resp: 18 16 18 18   Temp: 97.7 F (36.5 C) 97.5 F (36.4 C) 97.9 F (36.6 C) 97.5 F (36.4 C)   TempSrc: Oral Oral Oral Oral  SpO2: 100% 100% 100% 100%  Weight:      Height:        General: Pt is alert, awake, not in acute distress Cardiovascular: RRR, S1/S2 +, no rubs, no gallops Respiratory: CTA bilaterally, no wheezing, no rhonchi Abdominal: Soft, NT, ND, bowel sounds + Extremities: no edema, no cyanosis    The results of significant diagnostics from this hospitalization (including imaging, microbiology, ancillary and laboratory) are listed below for reference.     Microbiology: Recent Results (from the past 240 hour(s))  Urine culture     Status: Abnormal   Collection Time: 03/16/16  5:15 PM  Result Value Ref Range Status   Specimen Description URINE, RANDOM  Final   Special Requests NONE  Final   Culture (A)  Final    <10,000 COLONIES/mL INSIGNIFICANT GROWTH Performed at [facility]    Report Status 03/18/2016 FINAL  Final  Blood culture (routine x 2)     Status: None (Preliminary result)   Collection  Time: 03/16/16  5:55 PM  Result Value Ref Range Status   Specimen Description PORTA CATH  Final   Special Requests BOTTLES DRAWN AEROBIC AND ANAEROBIC 5CC  Final   Culture   Final    NO GROWTH 3 DAYS Performed at [facility]    Report Status PENDING  Incomplete  Blood culture (routine x 2)     Status: None (Preliminary result)   Collection Time: 03/16/16  6:37 PM  Result Value Ref Range Status   Specimen Description RIGHT ANTECUBITAL  Final   Special Requests BOTTLES DRAWN AEROBIC AND ANAEROBIC 5CC  Final   Culture   Final    NO GROWTH 3 DAYS Performed at [facility]    Report Status PENDING  Incomplete  Culture, sputum-assessment     Status: None   Collection Time: 03/17/16 10:50 AM  Result Value Ref Range Status   Specimen Description SPUTUM  Final   Special Requests NONE  Final   Sputum evaluation   Final    THIS SPECIMEN IS ACCEPTABLE. RESPIRATORY CULTURE REPORT TO FOLLOW.   Report Status 03/17/2016 FINAL  Final  Culture,  respiratory (NON-Expectorated)     Status: None (Preliminary result)   Collection Time: 03/17/16 10:50 AM  Result Value Ref Range Status   Specimen Description SPUTUM  Final   Special Requests NONE  Final   Gram Stain   Final    FEW WBC PRESENT, PREDOMINANTLY PMN ABUNDANT GRAM POSITIVE COCCI IN CLUSTERS MODERATE GRAM NEGATIVE RODS MODERATE GRAM NEGATIVE COCCOBACILLI FEW GRAM POSITIVE RODS FEW GRAM VARIABLE ROD    Culture   Final    CULTURE REINCUBATED FOR BETTER GROWTH Performed at [facility]    Report Status PENDING  Incomplete     Labs: BNP (last 3 results) No results for input(s): BNP in the last 8760 hours. Basic Metabolic Panel:  Recent Labs Lab 03/14/16 0916 03/16/16 1755 03/16/16 2059 03/17/16 0443 03/18/16 0030 03/18/16 0430  NA 141 134*  --  137 135 139  K 4.1 3.3*  --  3.2* 3.4* 3.6  CL  --  101  --  107 109 113*  CO2 25 21*  --  23 22 22   GLUCOSE 167* 173*  --  147* 151* 121*  BUN 9.9 14  --  12 12 13   CREATININE 0.9 0.87  --  0.71 0.67 0.65  CALCIUM 10.4 9.7  --  8.7* 8.5* 8.5*  MG  --   --  1.8 2.0 2.0  --   PHOS  --   --   --   --  1.1*  --    Liver Function Tests:  Recent Labs Lab 03/14/16 0916  AST 77*  ALT 10  ALKPHOS 127  BILITOT 0.38  PROT 7.7  ALBUMIN 2.6*   No results for input(s): LIPASE, AMYLASE in the last 168 hours. No results for input(s): AMMONIA in the last 168 hours. CBC:  Recent Labs Lab 03/14/16 0916 03/16/16 1755 03/17/16 0443 03/18/16 0430  WBC 8.3 11.1* 9.2 6.1  NEUTROABS 4.8  --   --   --   HGB 10.2* 10.2* 8.9* 8.3*  HCT 33.0* 33.3* 28.7* 26.7*  MCV 81.1 81.8 81.5 79.5  PLT 243 239 163 128*   Cardiac Enzymes:  Recent Labs Lab 03/18/16 0030  TROPONINI <0.03   BNP: Invalid input(s): POCBNP CBG:  Recent Labs Lab 03/18/16 1235 03/18/16 1739 03/18/16 2105 03/19/16 0752 03/19/16 1202  GLUCAP 206* 165* 134* 106* 113*  D-Dimer No results for input(s): DDIMER in the last 72 hours. Hgb  A1c No results for input(s): HGBA1C in the last 72 hours. Lipid Profile No results for input(s): CHOL, HDL, LDLCALC, TRIG, CHOLHDL, LDLDIRECT in the last 72 hours. Thyroid function studies No results for input(s): TSH, T4TOTAL, T3FREE, THYROIDAB in the last 72 hours.  Invalid input(s): FREET3 Anemia work up No results for input(s): VITAMINB12, FOLATE, FERRITIN, TIBC, IRON, RETICCTPCT in the last 72 hours. Urinalysis    Component Value Date/Time   COLORURINE AMBER (A) 03/16/2016 1715   APPEARANCEUR CLEAR 03/16/2016 1715   LABSPEC 1.026 03/16/2016 1715   PHURINE 6.0 03/16/2016 1715   GLUCOSEU NEGATIVE 03/16/2016 1715   HGBUR MODERATE (A) 03/16/2016 1715   BILIRUBINUR NEGATIVE 03/16/2016 1715   KETONESUR NEGATIVE 03/16/2016 1715   PROTEINUR 100 (A) 03/16/2016 1715   NITRITE NEGATIVE 03/16/2016 1715   LEUKOCYTESUR NEGATIVE 03/16/2016 1715   Sepsis Labs Invalid input(s): PROCALCITONIN,  WBC,  LACTICIDVEN Microbiology Recent Results (from the past 240 hour(s))  Urine culture     Status: Abnormal   Collection Time: 03/16/16  5:15 PM  Result Value Ref Range Status   Specimen Description URINE, RANDOM  Final   Special Requests NONE  Final   Culture (A)  Final    <10,000 COLONIES/mL INSIGNIFICANT GROWTH Performed at [facility]    Report Status 03/18/2016 FINAL  Final  Blood culture (routine x 2)     Status: None (Preliminary result)   Collection Time: 03/16/16  5:55 PM  Result Value Ref Range Status   Specimen Description PORTA CATH  Final   Special Requests BOTTLES DRAWN AEROBIC AND ANAEROBIC 5CC  Final   Culture   Final    NO GROWTH 3 DAYS Performed at [facility]    Report Status PENDING  Incomplete  Blood culture (routine x 2)     Status: None (Preliminary result)   Collection Time: 03/16/16  6:37 PM  Result Value Ref Range Status   Specimen Description RIGHT ANTECUBITAL  Final   Special Requests BOTTLES DRAWN AEROBIC AND ANAEROBIC 5CC  Final   Culture    Final    NO GROWTH 3 DAYS Performed at [facility]    Report Status PENDING  Incomplete  Culture, sputum-assessment     Status: None   Collection Time: 03/17/16 10:50 AM  Result Value Ref Range Status   Specimen Description SPUTUM  Final   Special Requests NONE  Final   Sputum evaluation   Final    THIS SPECIMEN IS ACCEPTABLE. RESPIRATORY CULTURE REPORT TO FOLLOW.   Report Status 03/17/2016 FINAL  Final  Culture, respiratory (NON-Expectorated)     Status: None (Preliminary result)   Collection Time: 03/17/16 10:50 AM  Result Value Ref Range Status   Specimen Description SPUTUM  Final   Special Requests NONE  Final   Gram Stain   Final    FEW WBC PRESENT, PREDOMINANTLY PMN ABUNDANT GRAM POSITIVE COCCI IN CLUSTERS MODERATE GRAM NEGATIVE RODS MODERATE GRAM NEGATIVE COCCOBACILLI FEW GRAM POSITIVE RODS FEW GRAM VARIABLE ROD    Culture   Final    CULTURE REINCUBATED FOR BETTER GROWTH Performed at [facility]    Report Status PENDING  Incomplete     Time coordinating discharge: Over 30 minutes  SIGNED:   Hosie Poisson, MD  [facility] 03/20/2016, 8:57 AM Pager   If 7PM-7AM, please contact night-coverage [email or web address] Password [email or web address]

## 2016-03-21 LAB — CULTURE, BLOOD (ROUTINE X 2)
Culture: NO GROWTH
Culture: NO GROWTH

## 2016-03-21 LAB — CEA: CEA: 55.6 ng/mL — AB (ref 0.0–4.7)

## 2016-03-24 DIAGNOSIS — Z23 Encounter for immunization: Secondary | ICD-10-CM | POA: Diagnosis not present

## 2016-03-26 ENCOUNTER — Other Ambulatory Visit: Payer: Self-pay | Admitting: Oncology

## 2016-03-28 ENCOUNTER — Other Ambulatory Visit (HOSPITAL_BASED_OUTPATIENT_CLINIC_OR_DEPARTMENT_OTHER): Payer: Medicare Other

## 2016-03-28 ENCOUNTER — Ambulatory Visit: Payer: Medicare Other

## 2016-03-28 ENCOUNTER — Telehealth: Payer: Self-pay | Admitting: *Deleted

## 2016-03-28 ENCOUNTER — Telehealth: Payer: Self-pay | Admitting: Oncology

## 2016-03-28 ENCOUNTER — Ambulatory Visit (HOSPITAL_BASED_OUTPATIENT_CLINIC_OR_DEPARTMENT_OTHER): Payer: Medicare Other

## 2016-03-28 ENCOUNTER — Ambulatory Visit (HOSPITAL_BASED_OUTPATIENT_CLINIC_OR_DEPARTMENT_OTHER): Payer: Medicare Other | Admitting: Oncology

## 2016-03-28 VITALS — BP 122/75 | HR 103 | Temp 98.6°F | Resp 18 | Ht 70.0 in | Wt 177.4 lb

## 2016-03-28 DIAGNOSIS — C7800 Secondary malignant neoplasm of unspecified lung: Secondary | ICD-10-CM | POA: Diagnosis not present

## 2016-03-28 DIAGNOSIS — C182 Malignant neoplasm of ascending colon: Secondary | ICD-10-CM

## 2016-03-28 DIAGNOSIS — Z95828 Presence of other vascular implants and grafts: Secondary | ICD-10-CM

## 2016-03-28 DIAGNOSIS — C787 Secondary malignant neoplasm of liver and intrahepatic bile duct: Secondary | ICD-10-CM | POA: Diagnosis not present

## 2016-03-28 DIAGNOSIS — Z5111 Encounter for antineoplastic chemotherapy: Secondary | ICD-10-CM

## 2016-03-28 LAB — CBC WITH DIFFERENTIAL/PLATELET
BASO%: 0.8 % (ref 0.0–2.0)
BASOS ABS: 0.1 10*3/uL (ref 0.0–0.1)
EOS ABS: 0 10*3/uL (ref 0.0–0.5)
EOS%: 0.4 % (ref 0.0–7.0)
HEMATOCRIT: 31.1 % — AB (ref 34.8–46.6)
HEMOGLOBIN: 9.8 g/dL — AB (ref 11.6–15.9)
LYMPH#: 1.9 10*3/uL (ref 0.9–3.3)
LYMPH%: 26.4 % (ref 14.0–49.7)
MCH: 25.4 pg (ref 25.1–34.0)
MCHC: 31.5 g/dL (ref 31.5–36.0)
MCV: 80.5 fL (ref 79.5–101.0)
MONO#: 1.3 10*3/uL — ABNORMAL HIGH (ref 0.1–0.9)
MONO%: 18.3 % — ABNORMAL HIGH (ref 0.0–14.0)
NEUT%: 54.1 % (ref 38.4–76.8)
NEUTROS ABS: 3.8 10*3/uL (ref 1.5–6.5)
Platelets: 207 10*3/uL (ref 145–400)
RBC: 3.86 10*6/uL (ref 3.70–5.45)
RDW: 21.2 % — AB (ref 11.2–14.5)
WBC: 7.1 10*3/uL (ref 3.9–10.3)

## 2016-03-28 LAB — COMPREHENSIVE METABOLIC PANEL
ALBUMIN: 2.6 g/dL — AB (ref 3.5–5.0)
ALK PHOS: 130 U/L (ref 40–150)
ALT: <9 U/L (ref 0–55)
AST: 95 U/L — AB (ref 5–34)
Anion Gap: 13 mEq/L — ABNORMAL HIGH (ref 3–11)
BILIRUBIN TOTAL: 0.44 mg/dL (ref 0.20–1.20)
BUN: 10.4 mg/dL (ref 7.0–26.0)
CALCIUM: 10.6 mg/dL — AB (ref 8.4–10.4)
CO2: 22 mEq/L (ref 22–29)
CREATININE: 0.8 mg/dL (ref 0.6–1.1)
Chloride: 108 mEq/L (ref 98–109)
EGFR: 85 mL/min/{1.73_m2} — ABNORMAL LOW (ref >90)
GLUCOSE: 142 mg/dL — AB (ref 70–140)
POTASSIUM: 3.5 meq/L (ref 3.5–5.1)
Sodium: 142 mEq/L (ref 136–145)
TOTAL PROTEIN: 7.6 g/dL (ref 6.4–8.3)

## 2016-03-28 LAB — CEA (IN HOUSE-CHCC): CEA (CHCC-IN HOUSE): 84.35 ng/mL — AB (ref 0.00–5.00)

## 2016-03-28 MED ORDER — FLUOROURACIL CHEMO INJECTION 2.5 GM/50ML
400.0000 mg/m2 | Freq: Once | INTRAVENOUS | Status: AC
  Administered 2016-03-28: 800 mg via INTRAVENOUS
  Filled 2016-03-28: qty 16

## 2016-03-28 MED ORDER — SODIUM CHLORIDE 0.9 % IV SOLN
10.0000 mg | Freq: Once | INTRAVENOUS | Status: AC
  Administered 2016-03-28: 10 mg via INTRAVENOUS
  Filled 2016-03-28: qty 1

## 2016-03-28 MED ORDER — PALONOSETRON HCL INJECTION 0.25 MG/5ML
INTRAVENOUS | Status: AC
  Filled 2016-03-28: qty 5

## 2016-03-28 MED ORDER — PALONOSETRON HCL INJECTION 0.25 MG/5ML
0.2500 mg | Freq: Once | INTRAVENOUS | Status: AC
  Administered 2016-03-28: 0.25 mg via INTRAVENOUS

## 2016-03-28 MED ORDER — SODIUM CHLORIDE 0.9% FLUSH
10.0000 mL | INTRAVENOUS | Status: DC | PRN
  Filled 2016-03-28: qty 10

## 2016-03-28 MED ORDER — LEUCOVORIN CALCIUM INJECTION 350 MG
400.0000 mg/m2 | Freq: Once | INTRAVENOUS | Status: AC
  Administered 2016-03-28: 792 mg via INTRAVENOUS
  Filled 2016-03-28: qty 39.6

## 2016-03-28 MED ORDER — SODIUM CHLORIDE 0.9 % IV SOLN
2400.0000 mg/m2 | INTRAVENOUS | Status: DC
  Administered 2016-03-28: 4750 mg via INTRAVENOUS
  Filled 2016-03-28: qty 95

## 2016-03-28 MED ORDER — SODIUM CHLORIDE 0.9 % IJ SOLN
10.0000 mL | INTRAMUSCULAR | Status: DC | PRN
  Administered 2016-03-28: 10 mL via INTRAVENOUS
  Filled 2016-03-28: qty 10

## 2016-03-28 MED ORDER — DEXTROSE 5 % IV SOLN
Freq: Once | INTRAVENOUS | Status: AC
  Administered 2016-03-28: 10:00:00 via INTRAVENOUS

## 2016-03-28 MED ORDER — OXALIPLATIN CHEMO INJECTION 100 MG/20ML
85.0000 mg/m2 | Freq: Once | INTRAVENOUS | Status: AC
  Administered 2016-03-28: 170 mg via INTRAVENOUS
  Filled 2016-03-28: qty 34

## 2016-03-28 NOTE — Progress Notes (Signed)
  [facility] OFFICE PROGRESS NOTE   Diagnosis: Colon cancer     INTERVAL HISTORY:   Judith Stephens returns as scheduled. She was admitted 03/16/2016 after a syncope event and with fever. No source for infection was identified. She was discharged 03/19/2016. No further fever. Good appetite. She reports dyspnea after showering. She is getting out of the house. She notes tingling in the fingers following chemotherapy, no neuropathy symptoms today.  Objective:  Vital signs in last 24 hours:  Blood pressure 122/75, pulse (!) 103, temperature 98.6 F (37 C), temperature source Oral, resp. rate 18, height 5\' 10"  (1.778 m), weight 177 lb 6.4 oz (80.5 kg), SpO2 100 %.    HEENT: No thrush or ulcers Resp: Rales at the right posterior base, no respiratory distress Cardio: Regular rate and rhythm GI: The liver is palpable in the midabdomen extending across the midline Vascular: Trace ankle edema bilaterally Neuro: Moderate loss of vibratory sense at the fingertip bilaterally    Portacath/PICC-without erythema  Lab Results:  Lab Results  Component Value Date   WBC 7.1 03/28/2016   HGB 9.8 (L) 03/28/2016   HCT 31.1 (L) 03/28/2016   MCV 80.5 03/28/2016   PLT 207 03/28/2016   NEUTROABS 3.8 03/28/2016   BUN 10.4, currently 0.8, calcium 10.6  CEA on 03/18/2016: 55.6    Medications: I have reviewed the patient's current medications.  Assessment/Plan: 1. 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 She began adjuvant Xeloda on 05/14/2012. Final cycle of Xeloda started on 10/22/2012.  CT abdomen/pelvis 01/10/2016-innumerable hepatic metastases  CT chest 01/12/2016-multiple pulmonary nodules consistent with metastases  Cycle 1 FOLFOX 02/01/2016  Cycle 2 FOLFOX 02/15/2016  Cycle 3 FOLFOX 02/29/2016  Cycle 4 FOLFOX 03/14/2016  Cycle 5 FOLFOX 03/28/2016 2. Severe microcytic anemia likely  secondary to iron deficiency related to bleeding from the colon tumor. The hemoglobin and MCV improved on oral iron. Oral iron was discontinued following office visit 12/16/2012.  Recurrent microcytic anemia July 2017  Stool positive for occult blood 02/01/2016  Referred to GI 3. Diabetes. 4. Hypertension. 5. Hyperlipidemia. 6. History of hand/foot syndrome secondary to Xeloda prompting the Xeloda to be discontinued after day 12, cycle 2. Improved. Xeloda was dose reduced beginning with cycle 3. 7. History of mild diarrhea secondary to Xeloda.  8. History of eye tearing-most likely related to seasonal allergies though this could be a manifestation of Xeloda toxicity.  9. Status post root canal 10/02/2012. 10. Colonoscopy 03/13/2013 by Dr. Amedeo Plenty. Pathology on transverse colon polyp showed a tubular adenoma. Next colonoscopy recommendedat a three-year interval. 11. Anorexia/weight loss 12. Port-A-Cath placement by Dr. Dalbert Batman 01/31/2016 13. 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 Her overall performance status has improved. The CEA is lower. She will complete cycle 5 FOLFOX today. Judith Stephens will be scheduled for a restaging CT evaluation in 2 weeks. She will return for an office visit in 3 weeks.  The calcium is mildly elevated today. We will check a calcium level when she returns for the pump disconnect on 03/30/2016.  Betsy Coder, MD  03/28/2016  9:01 AM

## 2016-03-28 NOTE — Progress Notes (Signed)
Reviewed pt labs (CBC and CMP) with Dr. Benay Spice who stated he was ok for pt to receive treatment with AST 95.

## 2016-03-28 NOTE — Patient Instructions (Signed)
[facility] Discharge Instructions for Patients Receiving Chemotherapy  Today you received the following chemotherapy agents Oxaliplatin/Leucovorin/Fluorouracil.  To help prevent nausea and vomiting after your treatment, we encourage you to take your nausea medication as directed.   If you develop nausea and vomiting that is not controlled by your nausea medication, call the clinic.   BELOW ARE SYMPTOMS THAT SHOULD BE REPORTED IMMEDIATELY:  *FEVER GREATER THAN 100.5 F  *CHILLS WITH OR WITHOUT FEVER  NAUSEA AND VOMITING THAT IS NOT CONTROLLED WITH YOUR NAUSEA MEDICATION  *UNUSUAL SHORTNESS OF BREATH  *UNUSUAL BRUISING OR BLEEDING  TENDERNESS IN MOUTH AND THROAT WITH OR WITHOUT PRESENCE OF ULCERS  *URINARY PROBLEMS  *BOWEL PROBLEMS  UNUSUAL RASH Items with * indicate a potential emergency and should be followed up as soon as possible.  Feel free to call the clinic you have any questions or concerns. The clinic phone number is [phone].  Please show the CHEMO ALERT CARD at check-in to the Emergency Department and triage nurse.    

## 2016-03-28 NOTE — Telephone Encounter (Signed)
Message sent to chemo scheduler to add chemo. Avs report and appointment schedule given to patient, per 03/28/16 los. °

## 2016-03-28 NOTE — Telephone Encounter (Signed)
Per LOS I have scheduled appts and notified the scheduler 

## 2016-03-30 ENCOUNTER — Other Ambulatory Visit (HOSPITAL_BASED_OUTPATIENT_CLINIC_OR_DEPARTMENT_OTHER): Payer: Medicare Other

## 2016-03-30 ENCOUNTER — Telehealth: Payer: Self-pay | Admitting: *Deleted

## 2016-03-30 ENCOUNTER — Ambulatory Visit (HOSPITAL_BASED_OUTPATIENT_CLINIC_OR_DEPARTMENT_OTHER): Payer: Medicare Other

## 2016-03-30 VITALS — BP 134/80 | HR 100 | Temp 99.4°F | Resp 16

## 2016-03-30 DIAGNOSIS — C182 Malignant neoplasm of ascending colon: Secondary | ICD-10-CM | POA: Diagnosis not present

## 2016-03-30 DIAGNOSIS — R509 Fever, unspecified: Secondary | ICD-10-CM | POA: Diagnosis not present

## 2016-03-30 DIAGNOSIS — C787 Secondary malignant neoplasm of liver and intrahepatic bile duct: Secondary | ICD-10-CM | POA: Diagnosis not present

## 2016-03-30 DIAGNOSIS — Z95828 Presence of other vascular implants and grafts: Secondary | ICD-10-CM

## 2016-03-30 LAB — COMPREHENSIVE METABOLIC PANEL
ALT: 19 U/L (ref 0–55)
ANION GAP: 17 meq/L — AB (ref 3–11)
AST: 211 U/L (ref 5–34)
Albumin: 2.5 g/dL — ABNORMAL LOW (ref 3.5–5.0)
Alkaline Phosphatase: 126 U/L (ref 40–150)
BUN: 12.7 mg/dL (ref 7.0–26.0)
CHLORIDE: 105 meq/L (ref 98–109)
CO2: 16 meq/L — AB (ref 22–29)
Calcium: 10.3 mg/dL (ref 8.4–10.4)
Creatinine: 0.9 mg/dL (ref 0.6–1.1)
EGFR: 73 mL/min/{1.73_m2} — AB (ref >90)
Glucose: 100 mg/dl (ref 70–140)
POTASSIUM: 3.3 meq/L — AB (ref 3.5–5.1)
Sodium: 138 mEq/L (ref 136–145)
Total Bilirubin: 0.65 mg/dL (ref 0.20–1.20)
Total Protein: 7.7 g/dL (ref 6.4–8.3)

## 2016-03-30 MED ORDER — ACETAMINOPHEN 325 MG PO TABS
ORAL_TABLET | ORAL | Status: AC
Start: 2016-03-30 — End: 2016-03-30
  Filled 2016-03-30: qty 2

## 2016-03-30 MED ORDER — HEPARIN SOD (PORK) LOCK FLUSH 100 UNIT/ML IV SOLN
500.0000 [IU] | Freq: Once | INTRAVENOUS | Status: AC
  Administered 2016-03-30: 500 [IU] via INTRAVENOUS
  Filled 2016-03-30: qty 5

## 2016-03-30 MED ORDER — SODIUM CHLORIDE 0.9 % IV SOLN
Freq: Once | INTRAVENOUS | Status: AC
  Administered 2016-03-30: 12:00:00 via INTRAVENOUS

## 2016-03-30 MED ORDER — SODIUM CHLORIDE 0.9% FLUSH
10.0000 mL | Freq: Once | INTRAVENOUS | Status: AC
  Administered 2016-03-30: 10 mL via INTRAVENOUS
  Filled 2016-03-30: qty 10

## 2016-03-30 MED ORDER — ACETAMINOPHEN 325 MG PO TABS
650.0000 mg | ORAL_TABLET | Freq: Once | ORAL | Status: AC
  Administered 2016-03-30: 650 mg via ORAL

## 2016-03-30 NOTE — Progress Notes (Signed)
Pt arrived today for pump stop. Pt complaint of dizziness, tired-feeling, and nausea. Pt offered nutrition and fluids, but refused. MD made aware of symptoms, temp 100.6, and HR 123. Orders placed by MD for tylenol 650mg , 1L of NS, and blood cultures from port to rule out port infection. Discussed treatment additions with pt and pt verbalized understanding.   Ned Card, NP came to assess pt post infusion. VSS. Temp 99.71F and HR 100. Nausea and dizziness resolved. Still complaining of some weakness. Pt refused fluids tomorrow, but okay to go home per Edgar. Dr. Gearldine Shown RN to place a f/u phone call for tomrrow morning to check-in with pt.

## 2016-03-30 NOTE — Telephone Encounter (Signed)
"  I was there Tuesday.  A script for wheezing was to be called in but [facility] does not have the order for montelukast 10 mg."   This nurse called [facility].  Order on 02-24-2016 for this medicine is on file.  They will fill this order for this patient.  Advised she discard this old bottle as this number has expired.

## 2016-03-30 NOTE — Patient Instructions (Addendum)

## 2016-03-30 NOTE — Progress Notes (Signed)
Late entry for 1230: Dr. Benay Spice made aware of elevated AST: 211. Likely related to chemo. No new orders.

## 2016-04-05 LAB — CULTURE, BLOOD (SINGLE)

## 2016-04-07 ENCOUNTER — Encounter: Payer: Self-pay | Admitting: Podiatry

## 2016-04-07 ENCOUNTER — Ambulatory Visit (INDEPENDENT_AMBULATORY_CARE_PROVIDER_SITE_OTHER): Payer: Medicare Other | Admitting: Podiatry

## 2016-04-07 VITALS — BP 114/78 | HR 102 | Resp 14

## 2016-04-07 DIAGNOSIS — E1140 Type 2 diabetes mellitus with diabetic neuropathy, unspecified: Secondary | ICD-10-CM

## 2016-04-07 DIAGNOSIS — B351 Tinea unguium: Secondary | ICD-10-CM | POA: Diagnosis not present

## 2016-04-07 DIAGNOSIS — M79676 Pain in unspecified toe(s): Secondary | ICD-10-CM | POA: Diagnosis not present

## 2016-04-07 NOTE — Progress Notes (Signed)
Patient ID: Judith Stephens, female   DOB: 1941/06/11, 75 y.o.   MRN: [id number] Complaint:  Visit Type: Patient returns to my office for continued preventative foot care services. Complaint: Patient states" my nails have grown long and thick and become painful to walk and wear shoes" Patient has been diagnosed with DM with no foot complications. The patient presents for preventative foot care services. No changes to ROS  Podiatric Exam: Vascular: dorsalis pedis and posterior tibial pulses are palpable bilateral. Capillary return is immediate. Temperature gradient is WNL. Skin turgor WNL  Sensorium: Diminished  Semmes Weinstein monofilament test. Normal tactile sensation bilaterally. Nail Exam: Pt has thick disfigured discolored nails with subungual debris noted bilateral entire nail hallux through fifth toenails Ulcer Exam: There is no evidence of ulcer or pre-ulcerative changes or infection. Orthopedic Exam: Muscle tone and strength are WNL. No limitations in general ROM. No crepitus or effusions noted. Foot type and digits show no abnormalities. Bony prominences are unremarkable. Skin: No Porokeratosis. No infection or ulcers  Diagnosis:  Onychomycosis, , Pain in right toe, pain in left toes  Treatment & Plan Procedures and Treatment: Consent by patient was obtained for treatment procedures. The patient understood the discussion of treatment and procedures well. All questions were answered thoroughly reviewed. Debridement of mycotic and hypertrophic toenails, 1 through 5 bilateral and clearing of subungual debris. No ulceration, no infection noted.  Return Visit-Office Procedure: Patient instructed to return to the office for a follow up visit 3 months for continued evaluation and treatment.    Gardiner Barefoot DPM

## 2016-04-09 ENCOUNTER — Other Ambulatory Visit: Payer: Self-pay | Admitting: Oncology

## 2016-04-12 ENCOUNTER — Emergency Department (HOSPITAL_COMMUNITY): Payer: Medicare Other

## 2016-04-12 ENCOUNTER — Encounter (HOSPITAL_COMMUNITY): Payer: Self-pay | Admitting: Emergency Medicine

## 2016-04-12 ENCOUNTER — Telehealth: Payer: Self-pay | Admitting: *Deleted

## 2016-04-12 ENCOUNTER — Inpatient Hospital Stay (HOSPITAL_COMMUNITY)
Admission: EM | Admit: 2016-04-12 | Discharge: 2016-04-14 | DRG: 871 | Disposition: A | Discharge status: Home Health | Payer: Medicare Other | Attending: Family Medicine | Admitting: Family Medicine

## 2016-04-12 DIAGNOSIS — Z87891 Personal history of nicotine dependence: Secondary | ICD-10-CM

## 2016-04-12 DIAGNOSIS — Z85038 Personal history of other malignant neoplasm of large intestine: Secondary | ICD-10-CM

## 2016-04-12 DIAGNOSIS — Z6825 Body mass index (BMI) 25.0-25.9, adult: Secondary | ICD-10-CM

## 2016-04-12 DIAGNOSIS — C7800 Secondary malignant neoplasm of unspecified lung: Secondary | ICD-10-CM | POA: Diagnosis present

## 2016-04-12 DIAGNOSIS — E11649 Type 2 diabetes mellitus with hypoglycemia without coma: Secondary | ICD-10-CM | POA: Diagnosis present

## 2016-04-12 DIAGNOSIS — E43 Unspecified severe protein-calorie malnutrition: Secondary | ICD-10-CM | POA: Diagnosis not present

## 2016-04-12 DIAGNOSIS — Z8249 Family history of ischemic heart disease and other diseases of the circulatory system: Secondary | ICD-10-CM

## 2016-04-12 DIAGNOSIS — J189 Pneumonia, unspecified organism: Secondary | ICD-10-CM | POA: Diagnosis present

## 2016-04-12 DIAGNOSIS — I10 Essential (primary) hypertension: Secondary | ICD-10-CM | POA: Diagnosis present

## 2016-04-12 DIAGNOSIS — A419 Sepsis, unspecified organism: Principal | ICD-10-CM | POA: Diagnosis present

## 2016-04-12 DIAGNOSIS — E119 Type 2 diabetes mellitus without complications: Secondary | ICD-10-CM

## 2016-04-12 DIAGNOSIS — E039 Hypothyroidism, unspecified: Secondary | ICD-10-CM | POA: Diagnosis present

## 2016-04-12 DIAGNOSIS — Y95 Nosocomial condition: Secondary | ICD-10-CM | POA: Diagnosis present

## 2016-04-12 DIAGNOSIS — R05 Cough: Secondary | ICD-10-CM | POA: Diagnosis not present

## 2016-04-12 DIAGNOSIS — C787 Secondary malignant neoplasm of liver and intrahepatic bile duct: Secondary | ICD-10-CM | POA: Diagnosis not present

## 2016-04-12 DIAGNOSIS — E876 Hypokalemia: Secondary | ICD-10-CM | POA: Diagnosis not present

## 2016-04-12 DIAGNOSIS — E785 Hyperlipidemia, unspecified: Secondary | ICD-10-CM | POA: Diagnosis present

## 2016-04-12 DIAGNOSIS — Z7951 Long term (current) use of inhaled steroids: Secondary | ICD-10-CM

## 2016-04-12 DIAGNOSIS — R509 Fever, unspecified: Secondary | ICD-10-CM | POA: Diagnosis not present

## 2016-04-12 DIAGNOSIS — Z7984 Long term (current) use of oral hypoglycemic drugs: Secondary | ICD-10-CM

## 2016-04-12 DIAGNOSIS — Z7982 Long term (current) use of aspirin: Secondary | ICD-10-CM

## 2016-04-12 DIAGNOSIS — Z66 Do not resuscitate: Secondary | ICD-10-CM | POA: Diagnosis present

## 2016-04-12 DIAGNOSIS — C189 Malignant neoplasm of colon, unspecified: Secondary | ICD-10-CM | POA: Diagnosis present

## 2016-04-12 DIAGNOSIS — F419 Anxiety disorder, unspecified: Secondary | ICD-10-CM | POA: Diagnosis present

## 2016-04-12 LAB — COMPREHENSIVE METABOLIC PANEL
ALT: 14 U/L (ref 14–54)
AST: 140 U/L — ABNORMAL HIGH (ref 15–41)
Albumin: 2.8 g/dL — ABNORMAL LOW (ref 3.5–5.0)
Alkaline Phosphatase: 119 U/L (ref 38–126)
Anion gap: 11 (ref 5–15)
BUN: 9 mg/dL (ref 6–20)
CHLORIDE: 103 mmol/L (ref 101–111)
CO2: 19 mmol/L — AB (ref 22–32)
Calcium: 10.5 mg/dL — ABNORMAL HIGH (ref 8.9–10.3)
Creatinine, Ser: 0.78 mg/dL (ref 0.44–1.00)
Glucose, Bld: 111 mg/dL — ABNORMAL HIGH (ref 65–99)
POTASSIUM: 3.6 mmol/L (ref 3.5–5.1)
SODIUM: 133 mmol/L — AB (ref 135–145)
Total Bilirubin: 0.6 mg/dL (ref 0.3–1.2)
Total Protein: 7.7 g/dL (ref 6.5–8.1)

## 2016-04-12 LAB — CBC WITH DIFFERENTIAL/PLATELET
BASOS ABS: 0 10*3/uL (ref 0.0–0.1)
BASOS PCT: 0 %
EOS ABS: 0 10*3/uL (ref 0.0–0.7)
EOS PCT: 0 %
HCT: 33.6 % — ABNORMAL LOW (ref 36.0–46.0)
Hemoglobin: 10.4 g/dL — ABNORMAL LOW (ref 12.0–15.0)
LYMPHS ABS: 2.7 10*3/uL (ref 0.7–4.0)
Lymphocytes Relative: 27 %
MCH: 25.7 pg — AB (ref 26.0–34.0)
MCHC: 31 g/dL (ref 30.0–36.0)
MCV: 83 fL (ref 78.0–100.0)
Monocytes Absolute: 3 10*3/uL — ABNORMAL HIGH (ref 0.1–1.0)
Monocytes Relative: 30 %
NEUTROS PCT: 43 %
Neutro Abs: 4.2 10*3/uL (ref 1.7–7.7)
PLATELETS: 269 10*3/uL (ref 150–400)
RBC: 4.05 MIL/uL (ref 3.87–5.11)
RDW: 19.2 % — ABNORMAL HIGH (ref 11.5–15.5)
WBC: 9.9 10*3/uL (ref 4.0–10.5)

## 2016-04-12 LAB — URINE MICROSCOPIC-ADD ON

## 2016-04-12 LAB — CBG MONITORING, ED: GLUCOSE-CAPILLARY: 80 mg/dL (ref 65–99)

## 2016-04-12 LAB — URINALYSIS, ROUTINE W REFLEX MICROSCOPIC
Bilirubin Urine: NEGATIVE
Glucose, UA: NEGATIVE mg/dL
Ketones, ur: NEGATIVE mg/dL
LEUKOCYTES UA: NEGATIVE
NITRITE: NEGATIVE
PH: 7 (ref 5.0–8.0)
Protein, ur: 30 mg/dL — AB
SPECIFIC GRAVITY, URINE: 1.015 (ref 1.005–1.030)

## 2016-04-12 LAB — I-STAT CG4 LACTIC ACID, ED: LACTIC ACID, VENOUS: 4.78 mmol/L — AB (ref 0.5–1.9)

## 2016-04-12 MED ORDER — SODIUM CHLORIDE 0.9 % IV SOLN
1000.0000 mL | INTRAVENOUS | Status: DC
  Administered 2016-04-13: 1000 mL via INTRAVENOUS

## 2016-04-12 MED ORDER — SODIUM CHLORIDE 0.9 % IV BOLUS (SEPSIS)
1000.0000 mL | Freq: Once | INTRAVENOUS | Status: AC
  Administered 2016-04-13: 1000 mL via INTRAVENOUS

## 2016-04-12 MED ORDER — ACETAMINOPHEN 500 MG PO TABS
1000.0000 mg | ORAL_TABLET | Freq: Once | ORAL | Status: AC
  Administered 2016-04-12: 1000 mg via ORAL
  Filled 2016-04-12: qty 2

## 2016-04-12 MED ORDER — SODIUM CHLORIDE 0.9 % IV BOLUS (SEPSIS)
500.0000 mL | Freq: Once | INTRAVENOUS | Status: AC
  Administered 2016-04-12: 500 mL via INTRAVENOUS

## 2016-04-12 MED ORDER — DEXTROSE 5 % IV SOLN
2.0000 g | Freq: Once | INTRAVENOUS | Status: AC
  Administered 2016-04-12: 2 g via INTRAVENOUS
  Filled 2016-04-12: qty 2

## 2016-04-12 MED ORDER — SODIUM CHLORIDE 0.9 % IV BOLUS (SEPSIS)
1000.0000 mL | Freq: Once | INTRAVENOUS | Status: AC
  Administered 2016-04-12: 1000 mL via INTRAVENOUS

## 2016-04-12 MED ORDER — VANCOMYCIN HCL IN DEXTROSE 1-5 GM/200ML-% IV SOLN
1000.0000 mg | Freq: Once | INTRAVENOUS | Status: AC
  Administered 2016-04-13: 1000 mg via INTRAVENOUS
  Filled 2016-04-12: qty 200

## 2016-04-12 NOTE — ED Provider Notes (Signed)
[facility] DEPT Provider Note   CSN: [id number] Arrival date & time: 04/12/16  2013  By signing my name below, I, Neta Mends, attest that this documentation has been prepared under the direction and in the presence of Harlene Ramus, [place]. Electronically Signed: Neta Mends, ED Scribe. 04/12/2016. 10:41 PM.    History   Chief Complaint Chief Complaint  Patient presents with  . Fever    The history is provided by the patient.   HPI Comments:  Judith Stephens is a 75 y.o. female with PMHx of metastatic colon cancer, DM, HTN, and HLD who presents to the Emergency Department complaining of a constant fever since last night. Pt states that she measured her fever at 102 at home. Pt complains of associated dizziness, nasal congestion, cough, difficulty breathing, generalized weakness and fatigue, abdominal pain, and decreased appetite. Pt is a colon cancer patient and had her last chemo treatment 1 week ago, and is not undergoing radiation. She notes her next scheduled chemo is on 10/30. Pt was seen in the ED for the same symptoms 2 weeks ago and stayed for 3 days with SIRS without source of infection found. Pt reports PSHx of colon surgery 4 years ago for cancer. Pt took tylenol last night for her fever with no relief. Pt denies sore throat, headache, neck stiffness, wheezing, generalized body aches, chest pain, heart palpitations, nausea, vomiting, diarrhea, dysuria, hematuria, vaginal discharge, leg swelling.   Past Medical History:  Diagnosis Date  . Anemia    hx of blood transfusion 03/06/12   . Anxiety   . Arthritis   . Blood transfusion   . Cancer [facility])    colon cancer  . Diabetes mellitus   . Hyperlipidemia   . Hypertension   . Hypothyroidism     Patient Active Problem List   Diagnosis Date Noted  . Pneumonia 04/13/2016  . Sepsis ([place]) 04/13/2016  . Protein-calorie malnutrition, severe 03/17/2016  . Syncope 03/16/2016  . Port catheter in place 03/14/2016  .  Dehydration 02/06/2016  . Liver metastasis ([place]) 01/13/2016  . Lung metastasis ([place]) 01/13/2016  . Sepsis, unspecified organism ([place]) 01/09/2016  . Hyponatremia 01/09/2016  . Hypokalemia 01/09/2016  . Incisional hernia 03/08/2012  . Normocytic anemia 03/06/2012  . Colon cancer ([place]) 03/06/2012  . Diabetes mellitus type II, non insulin dependent ([place]) 03/06/2012  . HTN (hypertension) 03/06/2012    Past Surgical History:  Procedure Laterality Date  . COLON SURGERY  2013  . cyst on ovary Right    removed  . LAPAROSCOPY  03/14/2012   Procedure: LAPAROSCOPY DIAGNOSTIC;  Surgeon: Adin Hector, MD;  Location: [facility];  Service: General;;  right colectomy  . PORTACATH PLACEMENT N/A 01/31/2016   Procedure: INSERTION PORT-A-CATH WITH ULTRA SOUND;  Surgeon: Fanny Skates, MD;  Location: [facility];  Service: General;  Laterality: N/A;  . TUBAL LIGATION      OB History    No data available       Home Medications    Prior to Admission medications   Medication Sig Start Date End Date Taking? Authorizing Provider  amLODipine (NORVASC) 10 MG tablet Take 10 mg by mouth daily.    Yes Historical Provider, MD  aspirin EC 81 MG tablet Take 81 mg by mouth daily.    Yes Historical Provider, MD  feeding supplement, ENSURE ENLIVE, (ENSURE ENLIVE) LIQD Take 237 mLs by mouth 2 (two) times daily between meals. 03/19/16  Yes Hosie Poisson, MD  fluticasone (FLONASE) 50 MCG/ACT nasal  spray Place 2 sprays into the nose daily as needed for rhinitis. For allergies   Yes Historical Provider, MD  glipiZIDE (GLUCOTROL) 10 MG tablet Take 10 mg by mouth daily.   Yes Historical Provider, MD  HYDROcodone-acetaminophen (NORCO/VICODIN) 5-325 MG tablet Take 1-2 tablets by mouth every 4 (four) hours as needed for moderate pain or severe pain. 01/31/16  Yes Fanny Skates, MD  levothyroxine (SYNTHROID, LEVOTHROID) 150 MCG tablet Take 150 mcg by mouth daily before breakfast.   Yes Historical Provider, MD  lidocaine-prilocaine  (EMLA) cream Apply 1 application topically as needed. Apply 1 hr prior to port access and cover with plastic wrap 01/25/16  Yes Ladell Pier, MD  loratadine (CLARITIN) 10 MG tablet Take 10 mg by mouth daily as needed for allergies.    Yes Historical Provider, MD  LORazepam (ATIVAN) 0.5 MG tablet Take 1 tablet (0.5 mg total) by mouth at bedtime as needed for anxiety. 01/21/16  Yes Ladell Pier, MD  ondansetron (ZOFRAN) 8 MG tablet Take 1 tablet (8 mg total) by mouth every 8 (eight) hours as needed for nausea or vomiting. 01/21/16  Yes Ladell Pier, MD  prochlorperazine (COMPAZINE) 5 MG tablet Take 1 tablet (5 mg total) by mouth every 6 (six) hours as needed for nausea or vomiting. 02/24/16  Yes Ladell Pier, MD  rosuvastatin (CRESTOR) 10 MG tablet Take 10 mg by mouth every Monday, Wednesday, and Friday.    Yes Historical Provider, MD  montelukast (SINGULAIR) 10 MG tablet Take 1 tablet (10 mg total) by mouth every evening. Patient not taking: Reported on 04/12/2016 02/24/16   Ladell Pier, MD    Family History Family History  Problem Relation Age of Onset  . Hypertension Father   . Hypertension Sister     x3  . Hypertension Brother     Social History Social History  Substance Use Topics  . Smoking status: Former Smoker    Packs/day: 0.25    Types: Cigarettes  . Smokeless tobacco: Never Used  . Alcohol use No     Comment: occasional wine      Allergies   No known allergies   Review of Systems Review of Systems  Constitutional: Positive for fatigue and fever.  HENT: Positive for congestion.   Respiratory: Positive for cough and shortness of breath. Negative for wheezing.   Cardiovascular: Negative for chest pain, palpitations and leg swelling.  Gastrointestinal: Negative for diarrhea, nausea and vomiting.  Genitourinary: Negative for dysuria, hematuria and vaginal discharge.  Musculoskeletal: Negative for myalgias and neck stiffness.  Neurological: Positive for dizziness  and weakness. Negative for headaches.  All other systems reviewed and are negative.    Physical Exam Updated Vital Signs BP 130/77 (BP Location: Left Arm)   Pulse 98   Temp 97.9 F (36.6 C) (Oral)   Resp (!) 24   Ht 5\' 11"  (1.803 m)   Wt 83 kg   SpO2 100%   BMI 25.52 kg/m   Physical Exam  Constitutional: She appears well-developed and well-nourished. No distress. She is not intubated.  HENT:  Head: Normocephalic and atraumatic.  Mouth/Throat: Uvula is midline, oropharynx is clear and moist and mucous membranes are normal. No oropharyngeal exudate, posterior oropharyngeal edema, posterior oropharyngeal erythema or tonsillar abscesses. No tonsillar exudate.  Eyes: Conjunctivae and EOM are normal. Pupils are equal, round, and reactive to light. Right eye exhibits no discharge. Left eye exhibits no discharge. No scleral icterus.  Neck: Normal range of motion. Neck supple.  Cardiovascular: Regular rhythm, normal heart sounds and intact distal pulses.   No murmur heard. Tachycardic (HR 116)  Pulmonary/Chest: Effort normal and breath sounds normal. No accessory muscle usage. No apnea, no tachypnea and no bradypnea. She is not intubated. No respiratory distress. She has no wheezes. She has no rales. She exhibits no tenderness.  Abdominal: Soft. She exhibits no distension and no mass. There is no tenderness. There is no rebound and no guarding. No hernia.  Well healed surgical scar noted to mid abdomen.   Musculoskeletal: She exhibits no edema.  Neurological: She is alert.  Skin: Skin is warm and dry.  Psychiatric: She has a normal mood and affect.  Nursing note and vitals reviewed.    ED Treatments / Results  DIAGNOSTIC STUDIES:  Oxygen Saturation is 97% on RA, normal by my interpretation.    COORDINATION OF CARE:  10:27 PM Discussed treatment plan with pt at bedside and pt agreed to plan.   Labs (all labs ordered are listed, but only abnormal results are displayed) Labs  Reviewed  COMPREHENSIVE METABOLIC PANEL - Abnormal; Notable for the following:       Result Value   Sodium 133 (*)    CO2 19 (*)    Glucose, Bld 111 (*)    Calcium 10.5 (*)    Albumin 2.8 (*)    AST 140 (*)    All other components within normal limits  URINALYSIS, ROUTINE W REFLEX MICROSCOPIC (NOT AT [facility]) - Abnormal; Notable for the following:    Color, Urine AMBER (*)    APPearance CLOUDY (*)    Hgb urine dipstick SMALL (*)    Protein, ur 30 (*)    All other components within normal limits  URINE MICROSCOPIC-ADD ON - Abnormal; Notable for the following:    Squamous Epithelial / LPF 0-5 (*)    Bacteria, UA RARE (*)    All other components within normal limits  CBC WITH DIFFERENTIAL/PLATELET - Abnormal; Notable for the following:    Hemoglobin 10.4 (*)    HCT 33.6 (*)    MCH 25.7 (*)    RDW 19.2 (*)    Monocytes Absolute 3.0 (*)    All other components within normal limits  LACTIC ACID, PLASMA - Abnormal; Notable for the following:    Lactic Acid, Venous 2.7 (*)    All other components within normal limits  COMPREHENSIVE METABOLIC PANEL - Abnormal; Notable for the following:    Potassium 3.2 (*)    Chloride 113 (*)    CO2 19 (*)    Albumin 2.5 (*)    AST 115 (*)    ALT 12 (*)    All other components within normal limits  CBC - Abnormal; Notable for the following:    RBC 3.68 (*)    Hemoglobin 9.3 (*)    HCT 31.3 (*)    MCH 25.3 (*)    MCHC 29.7 (*)    RDW 19.4 (*)    All other components within normal limits  I-STAT CG4 LACTIC ACID, ED - Abnormal; Notable for the following:    Lactic Acid, Venous 4.78 (*)    All other components within normal limits  I-STAT CG4 LACTIC ACID, ED - Abnormal; Notable for the following:    Lactic Acid, Venous 2.81 (*)    All other components within normal limits  CULTURE, BLOOD (ROUTINE X 2)  CULTURE, BLOOD (ROUTINE X 2)  URINE CULTURE  CULTURE, EXPECTORATED SPUTUM-ASSESSMENT  RESPIRATORY PANEL BY PCR  PROCALCITONIN  LEGIONELLA  PNEUMOPHILA SEROGP 1 UR AG  STREP PNEUMONIAE URINARY ANTIGEN  LACTIC ACID, PLASMA  CBG MONITORING, ED  I-STAT CG4 LACTIC ACID, ED    EKG  EKG Interpretation None       Radiology Dg Chest 2 View  Result Date: 04/12/2016 CLINICAL DATA:  Fever and cough for 1 day EXAM: CHEST  2 VIEW COMPARISON:  03/16/2016 FINDINGS: Cardiac shadow is stable. Left chest wall port is again seen and stable. The overall inspiratory effort is poor. Some thickening of the minor fissure is again identified. Slight increased density is noted in the left mid lung just below the port rows wall are which may represent some early infiltrate. No sizable effusion is noted. No bony abnormality is seen. IMPRESSION: Poor inspiratory effort. Chronic changes in the right mid lung. Suspicion of early infiltrate in the left mid lung. Electronically Signed   By: Inez Catalina M.D.   On: 04/12/2016 21:47    Procedures .Critical Care Performed by: Nona Dell Authorized by: Nona Dell   Critical care provider statement:    Critical care time (minutes):  30   Critical care was necessary to treat or prevent imminent or life-threatening deterioration of the following conditions:  Sepsis (pneumonia)   Critical care was time spent personally by me on the following activities:  Blood draw for specimens, development of treatment plan with patient or surrogate, discussions with consultants, evaluation of patient's response to treatment, examination of patient, obtaining history from patient or surrogate, ordering and performing treatments and interventions, ordering and review of laboratory studies, ordering and review of radiographic studies, pulse oximetry, re-evaluation of patient's condition and review of old charts   (including critical care time)  Medications Ordered in ED Medications  0.9 %  sodium chloride infusion (1,000 mLs Intravenous New Bag/Given 04/13/16 0312)  vancomycin (VANCOCIN) IVPB 1000  mg/200 mL premix (not administered)  ceFEPIme (MAXIPIME) 2 g in dextrose 5 % 50 mL IVPB (2 g Intravenous Given 04/13/16 0548)  feeding supplement (ENSURE ENLIVE) (ENSURE ENLIVE) liquid 237 mL (not administered)  LORazepam (ATIVAN) tablet 0.5 mg (not administered)  HYDROcodone-acetaminophen (NORCO/VICODIN) 5-325 MG per tablet 1-2 tablet (not administered)  rosuvastatin (CRESTOR) tablet 10 mg (not administered)  aspirin EC tablet 81 mg (not administered)  levothyroxine (SYNTHROID, LEVOTHROID) tablet 150 mcg (not administered)  fluticasone (FLONASE) 50 MCG/ACT nasal spray 2 spray (not administered)  loratadine (CLARITIN) tablet 10 mg (not administered)  amLODipine (NORVASC) tablet 10 mg (not administered)  lidocaine-prilocaine (EMLA) cream 1 application (not administered)  enoxaparin (LOVENOX) injection 40 mg (not administered)  ondansetron (ZOFRAN) tablet 4 mg (not administered)    Or  ondansetron (ZOFRAN) injection 4 mg (not administered)  acetaminophen (TYLENOL) tablet 650 mg (not administered)    Or  acetaminophen (TYLENOL) suppository 650 mg (not administered)  albuterol (PROVENTIL) (2.5 MG/3ML) 0.083% nebulizer solution 2.5 mg (not administered)  ipratropium (ATROVENT) nebulizer solution 0.5 mg (not administered)  insulin aspart (novoLOG) injection 0-9 Units (not administered)  guaiFENesin (MUCINEX) 12 hr tablet 600 mg (not administered)  prochlorperazine (COMPAZINE) injection 10 mg (not administered)  sodium chloride 0.9 % bolus 1,000 mL (0 mLs Intravenous Stopped 04/13/16 0019)  acetaminophen (TYLENOL) tablet 1,000 mg (1,000 mg Oral Given 04/12/16 2305)  sodium chloride 0.9 % bolus 1,000 mL (0 mLs Intravenous Stopped 04/13/16 0313)    And  sodium chloride 0.9 % bolus 1,000 mL (1,000 mLs Intravenous New Bag/Given 04/13/16 0254)    And  sodium chloride 0.9 % bolus 500  mL (0 mLs Intravenous Stopped 04/13/16 0220)  ceFEPIme (MAXIPIME) 2 g in dextrose 5 % 50 mL IVPB (0 g Intravenous  Stopped 04/13/16 0019)  vancomycin (VANCOCIN) IVPB 1000 mg/200 mL premix (0 mg Intravenous Stopped 04/13/16 0141)  sodium chloride 0.9 % bolus 500 mL (500 mLs Intravenous Given 04/13/16 [id number])     Initial Impression / Assessment and Plan / ED Course  I have reviewed the triage vital signs and the nursing notes.  Pertinent labs & imaging results that were available during my care of the patient were reviewed by me and considered in my medical decision making (see chart for details).  Clinical Course    Patient presents with fever. History of metastatic colon cancer. Notes she finished her last chemo tx last week and is scheduled for her next chemotherapy on 10/30. Initial temp 102.8, pt given tylenol. HR 123. Exam revealed unremarkable, lungs CTAB, abdominal exam benign. Lactic 4.78. Sepsis protocol initiated. CXR showed early onset left mid infiltrate. Pt started on antibiotics for HCAP.   On reevaluation, temp decreased 101.4, HR 109 s/p IVFs, normotensive, O2 sat 97% on RA. Consulted hospitalist for admission. Dr. Tamala Julian agrees to admission. Orders placed for telemetry bed. Discussed results and plan for admission with pt.   Final Clinical Impressions(s) / ED Diagnoses   Final diagnoses:  Healthcare-associated pneumonia    New Prescriptions Current Discharge Medication List     I personally performed the services described in this documentation, which was scribed in my presence. The recorded information has been reviewed and is accurate.    Chesley Noon [place], [place] 04/13/16 [id number]    Sharlett Iles, MD 04/14/16 2007

## 2016-04-12 NOTE — ED Notes (Signed)
Lactic Acid= 4.78, PA Braman notified

## 2016-04-12 NOTE — Telephone Encounter (Signed)
Spoke with pt and was informed that pt was feeling bad.  Stated appetite is fine, bowel and bladder function fine, drinking po fluids without problems.  Stated had temp last night but did not remember what it was.  Asked pt to check her temp now -  Temp was 99.7.  Stated coughing up clear sputum.    Pt has CT scan on Fri 04/14/16.   Instructed pt to keep scan as scheduled; continued to monitor for increased temp.  If pt develops temp of 100 or above tonight, pt needs to call on call provider for further instructions. Pt also aware of office appt with Dr. Benay Spice on  04/18/16.

## 2016-04-12 NOTE — ED Triage Notes (Signed)
Pt reports having fever and dizziness since last night. Pt is cancer pt with last chemo 2 weeks ago. Pt reports taking tylenol last night. Pt reports home temp of 102.

## 2016-04-13 ENCOUNTER — Encounter (HOSPITAL_COMMUNITY): Payer: Self-pay | Admitting: Radiology

## 2016-04-13 ENCOUNTER — Ambulatory Visit (HOSPITAL_COMMUNITY): Payer: Medicare Other

## 2016-04-13 ENCOUNTER — Inpatient Hospital Stay (HOSPITAL_COMMUNITY): Payer: Medicare Other

## 2016-04-13 DIAGNOSIS — J189 Pneumonia, unspecified organism: Secondary | ICD-10-CM | POA: Diagnosis present

## 2016-04-13 DIAGNOSIS — Z66 Do not resuscitate: Secondary | ICD-10-CM | POA: Diagnosis present

## 2016-04-13 DIAGNOSIS — E039 Hypothyroidism, unspecified: Secondary | ICD-10-CM | POA: Diagnosis present

## 2016-04-13 DIAGNOSIS — C7800 Secondary malignant neoplasm of unspecified lung: Secondary | ICD-10-CM | POA: Diagnosis present

## 2016-04-13 DIAGNOSIS — Z6825 Body mass index (BMI) 25.0-25.9, adult: Secondary | ICD-10-CM | POA: Diagnosis not present

## 2016-04-13 DIAGNOSIS — R509 Fever, unspecified: Secondary | ICD-10-CM | POA: Diagnosis not present

## 2016-04-13 DIAGNOSIS — C182 Malignant neoplasm of ascending colon: Secondary | ICD-10-CM | POA: Diagnosis not present

## 2016-04-13 DIAGNOSIS — A419 Sepsis, unspecified organism: Secondary | ICD-10-CM | POA: Diagnosis present

## 2016-04-13 DIAGNOSIS — Y95 Nosocomial condition: Secondary | ICD-10-CM | POA: Diagnosis present

## 2016-04-13 DIAGNOSIS — C189 Malignant neoplasm of colon, unspecified: Secondary | ICD-10-CM | POA: Diagnosis not present

## 2016-04-13 DIAGNOSIS — Z87891 Personal history of nicotine dependence: Secondary | ICD-10-CM | POA: Diagnosis not present

## 2016-04-13 DIAGNOSIS — E785 Hyperlipidemia, unspecified: Secondary | ICD-10-CM | POA: Diagnosis present

## 2016-04-13 DIAGNOSIS — Z8249 Family history of ischemic heart disease and other diseases of the circulatory system: Secondary | ICD-10-CM | POA: Diagnosis not present

## 2016-04-13 DIAGNOSIS — Z7982 Long term (current) use of aspirin: Secondary | ICD-10-CM | POA: Diagnosis not present

## 2016-04-13 DIAGNOSIS — E119 Type 2 diabetes mellitus without complications: Secondary | ICD-10-CM | POA: Diagnosis not present

## 2016-04-13 DIAGNOSIS — E876 Hypokalemia: Secondary | ICD-10-CM | POA: Diagnosis not present

## 2016-04-13 DIAGNOSIS — Z7984 Long term (current) use of oral hypoglycemic drugs: Secondary | ICD-10-CM | POA: Diagnosis not present

## 2016-04-13 DIAGNOSIS — E11649 Type 2 diabetes mellitus with hypoglycemia without coma: Secondary | ICD-10-CM | POA: Diagnosis present

## 2016-04-13 DIAGNOSIS — C787 Secondary malignant neoplasm of liver and intrahepatic bile duct: Secondary | ICD-10-CM | POA: Diagnosis present

## 2016-04-13 DIAGNOSIS — I10 Essential (primary) hypertension: Secondary | ICD-10-CM | POA: Diagnosis present

## 2016-04-13 DIAGNOSIS — E43 Unspecified severe protein-calorie malnutrition: Secondary | ICD-10-CM | POA: Diagnosis present

## 2016-04-13 DIAGNOSIS — Z7951 Long term (current) use of inhaled steroids: Secondary | ICD-10-CM | POA: Diagnosis not present

## 2016-04-13 DIAGNOSIS — F419 Anxiety disorder, unspecified: Secondary | ICD-10-CM | POA: Diagnosis present

## 2016-04-13 DIAGNOSIS — Z85038 Personal history of other malignant neoplasm of large intestine: Secondary | ICD-10-CM | POA: Diagnosis not present

## 2016-04-13 LAB — RESPIRATORY PANEL BY PCR
ADENOVIRUS-RVPPCR: NOT DETECTED
Bordetella pertussis: NOT DETECTED
CHLAMYDOPHILA PNEUMONIAE-RVPPCR: NOT DETECTED
CORONAVIRUS 229E-RVPPCR: NOT DETECTED
CORONAVIRUS HKU1-RVPPCR: NOT DETECTED
CORONAVIRUS NL63-RVPPCR: NOT DETECTED
Coronavirus OC43: NOT DETECTED
Influenza A: NOT DETECTED
Influenza B: NOT DETECTED
MYCOPLASMA PNEUMONIAE-RVPPCR: NOT DETECTED
Metapneumovirus: NOT DETECTED
Parainfluenza Virus 1: NOT DETECTED
Parainfluenza Virus 2: NOT DETECTED
Parainfluenza Virus 3: NOT DETECTED
Parainfluenza Virus 4: NOT DETECTED
Respiratory Syncytial Virus: NOT DETECTED
Rhinovirus / Enterovirus: NOT DETECTED

## 2016-04-13 LAB — GLUCOSE, CAPILLARY
GLUCOSE-CAPILLARY: 77 mg/dL (ref 65–99)
GLUCOSE-CAPILLARY: 84 mg/dL (ref 65–99)
Glucose-Capillary: 61 mg/dL — ABNORMAL LOW (ref 65–99)
Glucose-Capillary: 147 mg/dL — ABNORMAL HIGH (ref 65–99)

## 2016-04-13 LAB — I-STAT CG4 LACTIC ACID, ED: Lactic Acid, Venous: 2.81 mmol/L (ref 0.5–1.9)

## 2016-04-13 LAB — CBC
HCT: 31.3 % — ABNORMAL LOW (ref 36.0–46.0)
Hemoglobin: 9.3 g/dL — ABNORMAL LOW (ref 12.0–15.0)
MCH: 25.3 pg — AB (ref 26.0–34.0)
MCHC: 29.7 g/dL — AB (ref 30.0–36.0)
MCV: 85.1 fL (ref 78.0–100.0)
PLATELETS: 223 10*3/uL (ref 150–400)
RBC: 3.68 MIL/uL — AB (ref 3.87–5.11)
RDW: 19.4 % — ABNORMAL HIGH (ref 11.5–15.5)
WBC: 8.6 10*3/uL (ref 4.0–10.5)

## 2016-04-13 LAB — COMPREHENSIVE METABOLIC PANEL
ALK PHOS: 101 U/L (ref 38–126)
ALT: 12 U/L — AB (ref 14–54)
AST: 115 U/L — ABNORMAL HIGH (ref 15–41)
Albumin: 2.5 g/dL — ABNORMAL LOW (ref 3.5–5.0)
Anion gap: 8 (ref 5–15)
BUN: 8 mg/dL (ref 6–20)
CALCIUM: 9.7 mg/dL (ref 8.9–10.3)
CO2: 19 mmol/L — AB (ref 22–32)
CREATININE: 0.63 mg/dL (ref 0.44–1.00)
Chloride: 113 mmol/L — ABNORMAL HIGH (ref 101–111)
GFR calc non Af Amer: >60 mL/min (ref >60)
GLUCOSE: 72 mg/dL (ref 65–99)
Potassium: 3.2 mmol/L — ABNORMAL LOW (ref 3.5–5.1)
SODIUM: 140 mmol/L (ref 135–145)
Total Bilirubin: 0.7 mg/dL (ref 0.3–1.2)
Total Protein: 6.6 g/dL (ref 6.5–8.1)

## 2016-04-13 LAB — LACTIC ACID, PLASMA
LACTIC ACID, VENOUS: 3.2 mmol/L — AB (ref 0.5–1.9)
Lactic Acid, Venous: 2.7 mmol/L (ref 0.5–1.9)
Lactic Acid, Venous: 3.1 mmol/L (ref 0.5–1.9)

## 2016-04-13 LAB — PROCALCITONIN: Procalcitonin: 9.48 ng/mL

## 2016-04-13 LAB — STREP PNEUMONIAE URINARY ANTIGEN: Strep Pneumo Urinary Antigen: NEGATIVE

## 2016-04-13 MED ORDER — VANCOMYCIN HCL IN DEXTROSE 1-5 GM/200ML-% IV SOLN
1000.0000 mg | Freq: Two times a day (BID) | INTRAVENOUS | Status: DC
  Administered 2016-04-13 (×2): 1000 mg via INTRAVENOUS
  Filled 2016-04-13 (×2): qty 200

## 2016-04-13 MED ORDER — ONDANSETRON HCL 4 MG/2ML IJ SOLN: 4.0000 mg | Freq: Four times a day (QID) | INTRAMUSCULAR | Status: DC | PRN

## 2016-04-13 MED ORDER — AMLODIPINE BESYLATE 10 MG PO TABS
10.0000 mg | ORAL_TABLET | Freq: Every day | ORAL | Status: DC
  Administered 2016-04-13 – 2016-04-14 (×2): 10 mg via ORAL
  Filled 2016-04-13 (×2): qty 1

## 2016-04-13 MED ORDER — DEXTROSE 5 % IV SOLN
2.0000 g | Freq: Three times a day (TID) | INTRAVENOUS | Status: DC
  Administered 2016-04-13 – 2016-04-14 (×4): 2 g via INTRAVENOUS
  Filled 2016-04-13 (×6): qty 2

## 2016-04-13 MED ORDER — ASPIRIN EC 81 MG PO TBEC
81.0000 mg | DELAYED_RELEASE_TABLET | Freq: Every day | ORAL | Status: DC
  Administered 2016-04-13 – 2016-04-14 (×2): 81 mg via ORAL
  Filled 2016-04-13 (×2): qty 1

## 2016-04-13 MED ORDER — LORATADINE 10 MG PO TABS: 10.0000 mg | ORAL_TABLET | Freq: Every day | ORAL | Status: DC | PRN

## 2016-04-13 MED ORDER — ROSUVASTATIN CALCIUM 10 MG PO TABS
10.0000 mg | ORAL_TABLET | ORAL | Status: DC
  Administered 2016-04-14: 10 mg via ORAL
  Filled 2016-04-13: qty 1

## 2016-04-13 MED ORDER — FLUTICASONE PROPIONATE 50 MCG/ACT NA SUSP
2.0000 | Freq: Every day | NASAL | Status: DC | PRN
  Filled 2016-04-13: qty 16

## 2016-04-13 MED ORDER — IPRATROPIUM BROMIDE 0.02 % IN SOLN: 0.5000 mg | RESPIRATORY_TRACT | Status: DC | PRN

## 2016-04-13 MED ORDER — KCL IN DEXTROSE-NACL 20-5-0.9 MEQ/L-%-% IV SOLN
INTRAVENOUS | Status: DC
  Administered 2016-04-13 – 2016-04-14 (×3): via INTRAVENOUS
  Filled 2016-04-13 (×3): qty 1000

## 2016-04-13 MED ORDER — INSULIN ASPART 100 UNIT/ML ~~LOC~~ SOLN: 0.0000 [IU] | Freq: Three times a day (TID) | SUBCUTANEOUS | Status: DC

## 2016-04-13 MED ORDER — PROCHLORPERAZINE EDISYLATE 5 MG/ML IJ SOLN: 10.0000 mg | Freq: Four times a day (QID) | INTRAMUSCULAR | Status: DC | PRN

## 2016-04-13 MED ORDER — LEVOTHYROXINE SODIUM 50 MCG PO TABS
150.0000 ug | ORAL_TABLET | Freq: Every day | ORAL | Status: DC
  Administered 2016-04-13 – 2016-04-14 (×2): 150 ug via ORAL
  Filled 2016-04-13 (×2): qty 1

## 2016-04-13 MED ORDER — GUAIFENESIN ER 600 MG PO TB12
600.0000 mg | ORAL_TABLET | Freq: Two times a day (BID) | ORAL | Status: DC
  Administered 2016-04-13 – 2016-04-14 (×3): 600 mg via ORAL
  Filled 2016-04-13 (×3): qty 1

## 2016-04-13 MED ORDER — ALBUTEROL SULFATE (2.5 MG/3ML) 0.083% IN NEBU: 2.5000 mg | INHALATION_SOLUTION | RESPIRATORY_TRACT | Status: DC | PRN

## 2016-04-13 MED ORDER — ONDANSETRON HCL 4 MG PO TABS: 4.0000 mg | ORAL_TABLET | Freq: Four times a day (QID) | ORAL | Status: DC | PRN

## 2016-04-13 MED ORDER — LORAZEPAM 0.5 MG PO TABS: 0.5000 mg | ORAL_TABLET | Freq: Every evening | ORAL | Status: DC | PRN

## 2016-04-13 MED ORDER — LIDOCAINE-PRILOCAINE 2.5-2.5 % EX CREA
1.0000 "application " | TOPICAL_CREAM | CUTANEOUS | Status: DC | PRN
  Filled 2016-04-13: qty 5

## 2016-04-13 MED ORDER — IOPAMIDOL (ISOVUE-300) INJECTION 61%
100.0000 mL | Freq: Once | INTRAVENOUS | Status: AC | PRN
  Administered 2016-04-13: 100 mL via INTRAVENOUS

## 2016-04-13 MED ORDER — POTASSIUM CHLORIDE IN NACL 20-0.9 MEQ/L-% IV SOLN
INTRAVENOUS | Status: DC
  Filled 2016-04-13: qty 1000

## 2016-04-13 MED ORDER — IOPAMIDOL (ISOVUE-300) INJECTION 61%
15.0000 mL | Freq: Two times a day (BID) | INTRAVENOUS | Status: DC | PRN
  Administered 2016-04-13: 15 mL via ORAL
  Filled 2016-04-13: qty 30

## 2016-04-13 MED ORDER — ACETAMINOPHEN 325 MG PO TABS
650.0000 mg | ORAL_TABLET | Freq: Four times a day (QID) | ORAL | Status: DC | PRN
  Filled 2016-04-13: qty 2

## 2016-04-13 MED ORDER — HYDROCODONE-ACETAMINOPHEN 5-325 MG PO TABS: 1.0000 | ORAL_TABLET | ORAL | Status: DC | PRN

## 2016-04-13 MED ORDER — ENSURE ENLIVE PO LIQD
237.0000 mL | Freq: Two times a day (BID) | ORAL | Status: DC
  Administered 2016-04-13 – 2016-04-14 (×2): 237 mL via ORAL

## 2016-04-13 MED ORDER — SODIUM CHLORIDE 0.9 % IV BOLUS (SEPSIS)
500.0000 mL | Freq: Once | INTRAVENOUS | Status: AC
  Administered 2016-04-13: 500 mL via INTRAVENOUS

## 2016-04-13 MED ORDER — ENOXAPARIN SODIUM 40 MG/0.4ML ~~LOC~~ SOLN
40.0000 mg | SUBCUTANEOUS | Status: DC
  Administered 2016-04-13: 40 mg via SUBCUTANEOUS
  Filled 2016-04-13: qty 0.4

## 2016-04-13 MED ORDER — ACETAMINOPHEN 650 MG RE SUPP: 650.0000 mg | Freq: Four times a day (QID) | RECTAL | Status: DC | PRN

## 2016-04-13 MED ORDER — ACETAMINOPHEN 325 MG PO TABS
650.0000 mg | ORAL_TABLET | Freq: Four times a day (QID) | ORAL | Status: DC
  Administered 2016-04-13 – 2016-04-14 (×5): 650 mg via ORAL
  Filled 2016-04-13 (×4): qty 2

## 2016-04-13 NOTE — Progress Notes (Signed)
CRITICAL VALUE ALERT  Critical value received:  Lactic Acid 3.2  Date of notification:  04/13/16  Time of notification:  1400  Critical value read back: Yes  Nurse who received alert:  Laurance Flatten, RN  MD notified (1st page):  Dr. Wynetta Emery  Time of first page:  1402  MD notified (2nd page):  Time of second page:  Responding MD:    Time MD responded:

## 2016-04-13 NOTE — Progress Notes (Signed)
Pharmacy Antibiotic Note  Judith Stephens is a 75 y.o. female admitted on 04/12/2016 with pneumonia and sepsis.  Pharmacy has been consulted for Vancomycin, cefepime dosing.  Plan: Vancomycin 1gm IV every 12 hours.  Goal trough 15-20 mcg/mL.  Cefepime 2gm iv q8hr  Height: 5\' 11"  (180.3 cm) Weight: 176 lb (79.8 kg) IBW/kg (Calculated) : 70.8  Temp (24hrs), Avg:102.1 F (38.9 C), Min:101.4 F (38.6 C), Max:102.8 F (39.3 C)   Recent Labs Lab 04/12/16 2151 04/12/16 2241 04/12/16 2305  WBC  --  9.9  --   CREATININE 0.78  --   --   LATICACIDVEN  --   --  4.78*    Estimated Creatinine Clearance: 69 mL/min (by C-G formula based on SCr of 0.78 mg/dL).    Allergies  Allergen Reactions  . No Known Allergies Other (See Comments)    Antimicrobials this admission: Vancomycin 04/13/2016 >> Cefepime 04/13/2016 >>   Dose adjustments this admission: -  Microbiology results: pending  Thank you for allowing pharmacy to be a part of this patient's care.  Judith Stephens 04/13/2016 2:37 AM

## 2016-04-13 NOTE — H&P (Signed)
History and Physical    [name] WPY:[id number] DOB: 1940-06-25 DOA: 04/12/2016  Referring MD/NP/PA: Harlene Ramus, PA-C PCP: Donnie Coffin, MD  Patient coming from: Home  Chief Complaint: Fever  HPI: Judith Stephens is a 75 y.o. 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 Most recent symptoms of fever since yesterday evening(10/24). Patient reports that she measured her temperature at home and initially measured 99.28F for which she took 2 Tylenol. However, symptoms persisted and subsequent rechecks over the course of next day revealed fever as high as 103F. She called her oncologist's office recommended her to come in for further evaluation. Associated symptoms included dizziness/lightheadedness, diaphoresis, palpitations, intermittent cough with thick sputum production, malaise/fatigue, generalized weakness, and poor appetite.  Denies any recent sick contacts, hemoptysis, wheezing, body aches, muscle pain, nausea, vomiting, diarrhea/constipation, loss of consciousness, leg swelling, or dysuria. Her last chemotherapy therapy session was was on 10/12. She is scheduled to have her next dose of chemotherapy on the 30th this month. Patient had been admitted into the hospital last month thought to have sepsis, but no source of infection was found. She is followed by Dr. Mickey Farber of oncology and reports that she is scheduled to have a CT scan of the abdomen later this week.  ED Course: Upon admission to the emergency department patient was seen have purulent 101.12F, heart rates up to 127, respirations up to 33, normal vitals relatively within normal limits. Lab work revealed WBC 9.9, hemoglobin 10.4, platelets 269, sodium 133, potassium 3.6, chloride 103, CO2 19, BUN 9, creatinine 0.78, calcium 10.5, glucose 111, lactic acid 4.78. Suspected  protocol was initiated and the patient was placed on empiric antibiotics of vancomycin and cefepime considering her recent hospitalization. Chest x-ray was of poor inspiratory effort, but gave suspicion for early infiltrate in the left midlung.  Review of Systems: As per HPI otherwise 10 point review of systems negative.   Past Medical History:  Diagnosis Date  . Anemia    hx of blood transfusion 03/06/12   . Anxiety   . Arthritis   . Blood transfusion   . Cancer [facility])    colon cancer  . Diabetes mellitus   . Hyperlipidemia   . Hypertension   . Hypothyroidism     Past Surgical History:  Procedure Laterality Date  . COLON SURGERY  2013  . cyst on ovary Right    removed  . LAPAROSCOPY  03/14/2012   Procedure: LAPAROSCOPY DIAGNOSTIC;  Surgeon: Adin Hector, MD;  Location: [facility];  Service: General;;  right colectomy  . PORTACATH PLACEMENT N/A 01/31/2016   Procedure: INSERTION PORT-A-CATH WITH ULTRA SOUND;  Surgeon: Fanny Skates, MD;  Location: [place];  Service: General;  Laterality: N/A;  . TUBAL LIGATION       reports that she has quit smoking. Her smoking use included Cigarettes. She smoked 0.25 packs per day. She has never used smokeless tobacco. She reports that she does not drink alcohol or use drugs.  Allergies  Allergen Reactions  . No Known Allergies Other (See Comments)    Family History  Problem Relation Age of Onset  . Hypertension Father   . Hypertension Sister     x3  . Hypertension Brother     Prior to Admission medications   Medication Sig Start Date End Date Taking? Authorizing Provider  amLODipine (NORVASC) 10 MG  tablet Take 10 mg by mouth daily.    Yes Historical Provider, MD  aspirin EC 81 MG tablet Take 81 mg by mouth daily.    Yes Historical Provider, MD  feeding supplement, ENSURE ENLIVE, (ENSURE ENLIVE) LIQD Take 237 mLs by mouth 2 (two) times daily between meals. 03/19/16  Yes Hosie Poisson, MD  fluticasone (FLONASE) 50 MCG/ACT nasal spray Place  2 sprays into the nose daily as needed for rhinitis. For allergies   Yes Historical Provider, MD  glipiZIDE (GLUCOTROL) 10 MG tablet Take 10 mg by mouth daily.   Yes Historical Provider, MD  HYDROcodone-acetaminophen (NORCO/VICODIN) 5-325 MG tablet Take 1-2 tablets by mouth every 4 (four) hours as needed for moderate pain or severe pain. 01/31/16  Yes Fanny Skates, MD  levothyroxine (SYNTHROID, LEVOTHROID) 150 MCG tablet Take 150 mcg by mouth daily before breakfast.   Yes Historical Provider, MD  lidocaine-prilocaine (EMLA) cream Apply 1 application topically as needed. Apply 1 hr prior to port access and cover with plastic wrap 01/25/16  Yes Ladell Pier, MD  loratadine (CLARITIN) 10 MG tablet Take 10 mg by mouth daily as needed for allergies.    Yes Historical Provider, MD  LORazepam (ATIVAN) 0.5 MG tablet Take 1 tablet (0.5 mg total) by mouth at bedtime as needed for anxiety. 01/21/16  Yes Ladell Pier, MD  ondansetron (ZOFRAN) 8 MG tablet Take 1 tablet (8 mg total) by mouth every 8 (eight) hours as needed for nausea or vomiting. 01/21/16  Yes Ladell Pier, MD  prochlorperazine (COMPAZINE) 5 MG tablet Take 1 tablet (5 mg total) by mouth every 6 (six) hours as needed for nausea or vomiting. 02/24/16  Yes Ladell Pier, MD  rosuvastatin (CRESTOR) 10 MG tablet Take 10 mg by mouth every Monday, Wednesday, and Friday.    Yes Historical Provider, MD  montelukast (SINGULAIR) 10 MG tablet Take 1 tablet (10 mg total) by mouth every evening. Patient not taking: Reported on 04/12/2016 02/24/16   Ladell Pier, MD    Physical Exam:    Constitutional: Chronically ill-appearing female who is lethargic, but nontoxic and able to follow commands. Vitals:   04/13/16 0130 04/13/16 0200 04/13/16 0230 04/13/16 0336  BP: 124/68 127/65 170/89   Pulse: 97 97 102   Resp: _0 Temp:    98.4 F (36.9 C)  TempSrc:    Oral  SpO2: 96% 98% 100%   Weight:      Height:       Eyes: PERRL, lids and  conjunctivae normal ENMT: Mucous membranes are dry Posterior pharynx clear of any exudate or lesions.Normal dentition.  Neck: normal, supple, no masses, no thyromegaly Respiratory: clear to auscultation bilaterally, no wheezing, no crackles. Normal respiratory effort. No accessory muscle use.  Cardiovascular: Regular rate and rhythm, no murmurs / rubs / gallops. No extremity edema. 2+ pedal pulses. No carotid bruits.  Abdomen: no tenderness, no masses palpated. No hepatosplenomegaly. Bowel sounds positive.  Musculoskeletal: no clubbing / cyanosis. No joint deformity upper and lower extremities. Good ROM, no contractures. Normal muscle tone.  Skin: no rashes, lesions, ulcers. No induration Neurologic: CN 2-12 grossly intact. Sensation intact, DTR normal. Strength 5/5 in all 4.  Psychiatric: Normal judgment and insight. Alert and oriented x 3. Normal mood.     Labs on Admission: I have personally reviewed following labs and imaging studies  CBC:  Recent Labs Lab 04/12/16 2241  WBC 9.9  NEUTROABS 4.2  HGB 10.4*  HCT  33.6*  MCV 83.0  PLT 536   Basic Metabolic Panel:  Recent Labs Lab 04/12/16 2151  NA 133*  K 3.6  CL 103  CO2 19*  GLUCOSE 111*  BUN 9  CREATININE 0.78  CALCIUM 10.5*   GFR: Estimated Creatinine Clearance: 69 mL/min (by C-G formula based on SCr of 0.78 mg/dL). Liver Function Tests:  Recent Labs Lab 04/12/16 2151  AST 140*  ALT 14  ALKPHOS 119  BILITOT 0.6  PROT 7.7  ALBUMIN 2.8*   No results for input(s): LIPASE, AMYLASE in the last 168 hours. No results for input(s): AMMONIA in the last 168 hours. Coagulation Profile: No results for input(s): INR, PROTIME in the last 168 hours. Cardiac Enzymes: No results for input(s): CKTOTAL, CKMB, CKMBINDEX, TROPONINI in the last 168 hours. BNP (last 3 results) No results for input(s): PROBNP in the last 8760 hours. HbA1C: No results for input(s): HGBA1C in the last 72 hours. CBG:  Recent Labs Lab  04/12/16 2051  GLUCAP 80   Lipid Profile: No results for input(s): CHOL, HDL, LDLCALC, TRIG, CHOLHDL, LDLDIRECT in the last 72 hours. Thyroid Function Tests: No results for input(s): TSH, T4TOTAL, FREET4, T3FREE, THYROIDAB in the last 72 hours. Anemia Panel: No results for input(s): VITAMINB12, FOLATE, FERRITIN, TIBC, IRON, RETICCTPCT in the last 72 hours. Urine analysis:    Component Value Date/Time   COLORURINE AMBER (A) 04/12/2016 2130   APPEARANCEUR CLOUDY (A) 04/12/2016 2130   LABSPEC 1.015 04/12/2016 2130   PHURINE 7.0 04/12/2016 2130   GLUCOSEU NEGATIVE 04/12/2016 2130   HGBUR SMALL (A) 04/12/2016 2130   BILIRUBINUR NEGATIVE 04/12/2016 2130   [place] NEGATIVE 04/12/2016 2130   PROTEINUR 30 (A) 04/12/2016 2130   NITRITE NEGATIVE 04/12/2016 2130   LEUKOCYTESUR NEGATIVE 04/12/2016 2130   Sepsis Labs: No results found for this or any previous visit (from the past 240 hour(s)).   Radiological Exams on Admission: Dg Chest 2 View  Result Date: 04/12/2016 CLINICAL DATA:  Fever and cough for 1 day EXAM: CHEST  2 VIEW COMPARISON:  03/16/2016 FINDINGS: Cardiac shadow is stable. Left chest wall port is again seen and stable. The overall inspiratory effort is poor. Some thickening of the minor fissure is again identified. Slight increased density is noted in the left mid lung just below the port rows wall are which may represent some early infiltrate. No sizable effusion is noted. No bony abnormality is seen. IMPRESSION: Poor inspiratory effort. Chronic changes in the right mid lung. Suspicion of early infiltrate in the left mid lung. Electronically Signed   By: Inez Catalina M.D.   On: 04/12/2016 21:47    EKG: Independently reviewed. Sinus rhythm  Assessment/Plan Sepsis, of unknown source / possible HCAP - Admit to a telemetry bed - Patient met sepsis criteria with fever to 101.43F, lactic acid at 4.78, tachycardic, and tachypnea. - Sepsis protocol initiated - Follow-up blood  and sputum cultures - Continue empiric antibiotics of cefepime and vancomycin - IV fluids normal saline at 134m/hr as tolerated - Consider need to recheck 2 view chest x-ray in a.m. - Trend lactic acid levels  Metastatic Colon cancer with lung and liver metastases - Continue hydrocodone prn pain - Notify Dr. SBenay Spicethe patient's admitting to the hospital and question need to obtain CT scan of abdomen as an inpatient.  Anxiety - Continue Ativan prn anxiety  Hypercalcemia: initial calcium Stephens 10.5 on admission. - IV fluids - Recheck calcium levels in a.m.  Essential hypertension - continue amlodipine  Hypothyroidism:  TSH seen to be within normal limits on 7/27. - continue levothyroxine  Hyperlipidemia - Continue Crestor  Elevated AST  Diabetes mellitus type 2 - Hypoglycemic protocol - Hold glipizide  DVT prophylaxis: Lovenox  Code Status: Full Family Communication: No family present at bedside Disposition Plan: Discharge home once medically stable Consults called: None  Admission status: Inpatient  Norval Morton MD [facility] Pager [phone]  If 7PM-7AM, please contact night-coverage [email or web address] Password [facility]  04/13/2016, 3:43 AM

## 2016-04-13 NOTE — Progress Notes (Signed)
Judith Stephens is a 74 y.o. female patient admitted from ED awake, alert - oriented  X 4 - no acute distress noted.  VSS - Blood pressure 130/77, pulse 98, temperature 97.9 F (36.6 C), temperature source Oral, resp. rate (!) 24, height 5\' 11"  (1.803 m), weight 83 kg (182 lb 15.7 oz), SpO2 100 %.    IV in place, occlusive dsg intact without redness.  Orientation to room, and floor completed with information packet given to patient/family.  Patient declined safety video at this time.  Admission INP armband ID verified with patient/family, and in place.   SR up x 2, fall assessment complete, with patient and family able to verbalize understanding of risk associated with falls, and verbalized understanding to call nsg before up out of bed.  Call light within reach, patient able to voice, and demonstrate understanding.  Skin, clean-dry- intact without evidence of bruising, or skin tears.   No evidence of skin break down noted on exam.     Will cont to eval and treat per MD orders.  Marcy Salvo, RN 04/13/2016 7:03 AM

## 2016-04-13 NOTE — Progress Notes (Signed)
Spoke with pt today concerning Home Health.  Pt getting lab drawn, explained to pt that I will return in the am.

## 2016-04-13 NOTE — Evaluation (Signed)
Physical Therapy Evaluation Patient Details Name: Judith Stephens MRN: [id number] DOB: 09/20/1940 Today's Date: 04/13/2016   History of Present Illness  75 yo female admitted with fever, sepsis. Hx of met colon cancer-on chemo, HTN, DM  Clinical Impression  On eval, pt required Min guard-Min assist for mobility. She walked ~15'x2 (to and from bathroom). Pt is generally weak and fatigues fairly easily with activity. Will plan to follow up to progress activity and continue to assess DME needs.     Follow Up Recommendations Home health PT;Supervision - Intermittent    Equipment Recommendations   (to be determined)    Recommendations for Other Services       Precautions / Restrictions Precautions Precautions: Fall Precaution Comments: droplet Restrictions Weight Bearing Restrictions: No      Mobility  Bed Mobility Overal bed mobility: Needs Assistance Bed Mobility: Supine to Sit;Sit to Supine     Supine to sit: Min guard Sit to supine: Min guard   General bed mobility comments: Increased time.   Transfers Overall transfer level: Needs assistance   Transfers: Sit to/from Stand Sit to Stand: Min guard         General transfer comment: close guard for safety  Ambulation/Gait Ambulation/Gait assistance: Min assist Ambulation Distance (Feet): 15 Feet (x2) Assistive device:  (IV pole) Gait Pattern/deviations: Step-through pattern;Decreased step length - right;Decreased step length - left;Decreased stride length     General Gait Details: slow gait speed. Pt is mildly unsteady and required IV pole for support and intermittent assist to steady as well.   Stairs            Wheelchair Mobility    Modified Rankin (Stroke Patients Only)       Balance                                             Pertinent Vitals/Pain Pain Assessment: No/denies pain    Home Living Family/patient expects to be discharged to:: Private residence Living  Arrangements: Spouse/significant other Available Help at Discharge: Family Type of Home: House Home Access: Level entry     Home Layout: Two level;Bed/bath upstairs Home Equipment: None      Prior Function Level of Independence: Independent               Hand Dominance        Extremity/Trunk Assessment   Upper Extremity Assessment: Overall WFL for tasks assessed           Lower Extremity Assessment: Generalized weakness      Cervical / Trunk Assessment: Normal  Communication      Cognition Arousal/Alertness: Awake/alert Behavior During Therapy: WFL for tasks assessed/performed Overall Cognitive Status: Within Functional Limits for tasks assessed                      General Comments      Exercises     Assessment/Plan    PT Assessment Patient needs continued PT services  PT Problem List Decreased strength;Decreased mobility;Decreased activity tolerance;Decreased balance          PT Treatment Interventions Gait training;Therapeutic activities;Therapeutic exercise;Patient/family education;Functional mobility training;DME instruction    PT Goals (Current goals can be found in the Care Plan section)  Acute Rehab PT Goals Patient Stated Goal: to get better PT Goal Formulation: With patient Time For Goal Achievement: 04/27/16 Potential to Achieve Goals:  Good    Frequency Min 3X/week   Barriers to discharge        Co-evaluation               End of Session   Activity Tolerance: Patient limited by fatigue Patient left: in bed;with call [place]/phone within reach;with bed alarm set           Time: 3976-7341 PT Time Calculation (min) (ACUTE ONLY): 8 min   Charges:   PT Evaluation $PT Eval Low Complexity: 1 Procedure     PT G Codes:        Weston Anna, MPT Pager: [phone]

## 2016-04-13 NOTE — Progress Notes (Signed)
04/12/2016  8:44 PM  04/13/2016 9:55 AM  Judith Stephens was seen and examined.  The H&P by the admitting provider , orders, imaging was reviewed.  Please see orders.  Will continue to follow.   Murvin Natal, MD [facility]

## 2016-04-13 NOTE — Progress Notes (Signed)
Initial Nutrition Assessment  DOCUMENTATION CODES:   Severe malnutrition in context of chronic illness  INTERVENTION:  Continue Ensure Enlive po BID, each supplement provides 350 kcal and 20 grams of protein.  Encouraged adequate intake of protein and calories through meals and beverages.   NUTRITION DIAGNOSIS:   Increased nutrient needs related to catabolic illness, cancer and cancer related treatments as evidenced by estimated needs.  GOAL:   Patient will meet greater than or equal to 90% of their needs  MONITOR:   PO intake, Supplement acceptance, Weight trends, Labs, I & O's  REASON FOR ASSESSMENT:   Malnutrition Screening Tool    ASSESSMENT:   75 y.o. 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   -Last chemotherapy about 2 weeks ago -Patient also followed by Raford Pitcher, RD at outpatient cancer center.  Patient is known to this RD from a previous admission. Patient reports she has been able to maintain her weight for the past month by eating more and drinking 2 Ensure Enlive daily at home. She attempts to eat 3 meals daily. For the past 2 weeks, however, she has had a poor appetite again. She reports tender abdomen, but denies N/V, C/D, or difficulty chewing/swallowing.  Patient's UBW was 210 lbs. She had lost 34 lbs (16% body weight) over 5 months, which is significant for time frame. Over the past month she has maintained her weight. The weight loss was due to poor appetite and intake (50-75% of usual intake for a few months).   Medications reviewed and include: levothyroxine, D5-NS with KCl 20 mEq/L @ 125 ml/hr (150 grams dextrose, 510 kcal daily).  Labs reviewed: CBG 61-84, Potassium 3.2, Chloride 113, CO2 19.  Nutrition-Focused physical exam completed. Findings are no fat depletion, no muscle  depletion, and no edema.   Diet Order:  Diet Carb Modified Fluid consistency: Thin; Room service appropriate? Yes  Skin:  Reviewed, no issues  Last BM:  04/12/2016  Height:   Ht Readings from Last 1 Encounters:  04/12/16 5\' 11"  (1.803 m)    Weight:   Wt Readings from Last 1 Encounters:  04/13/16 182 lb 15.7 oz (83 kg)    Ideal Body Weight:  70.45 kg  BMI:  Body mass index is 25.52 kg/m.  Estimated Nutritional Needs:   Kcal:  [id number] (30-35 kcal/kg)  Protein:  100-125 grams (1.2-1.5 grams/kg)  Fluid:  >/= 2.5 L/day  EDUCATION NEEDS:   Education needs addressed  Willey Blade, MS, RD, LDN Pager: [phone] After Hours Pager: [phone]

## 2016-04-13 NOTE — Consult Note (Signed)
   [facility] CM Inpatient Consult   04/13/2016  Judith Stephens 05-14-1941 [id number]    [facility] Care Management referral received. Spoke with inpatient RNCM prior to bedside visit. Spoke with Judith Stephens at bedside. Explained and offered [place] Management services. She politely declines [facility] Care Management program at this time. States she wanted a list of cleaning agencies for light house duty. Reports she lives with her husband and does not have any other community resources, medications, or disease management concerns.  Made inpatient RNCM aware of bedside conversation with Judith Stephens and that she declined [place] Management services.  Judith Rolling, MSN-Ed, RN,BSN [facility] Liaison [phone]

## 2016-04-13 NOTE — Progress Notes (Signed)
CRITICAL VALUE ALERT  Critical value received:  Lactic acid 2.7  Date of notification:  04/13/16  Time of notification:  0555  Critical value read back:Yes.    Nurse who received alert:  Linden Dolin   MD notified (1st page):  Schorr   Time of first page:  [phone]  MD notified (2nd page):  Time of second page:  Responding MD:  Schorr   Time MD responded:  [phone]

## 2016-04-13 NOTE — Progress Notes (Signed)
CRITICAL VALUE ALERT  Critical value received:  Lactic Acid 3.1  Date of notification:  04/13/16  Time of notification:  0840  Critical value read back: Yes  Nurse who received alert:  Laurance Flatten, RN  MD notified (1st page):  Dr. Wynetta Emery  Time of first page:  0840  MD notified (2nd page):  Time of second page:  Responding MD:  Dr. Wynetta Emery  Time MD responded:  [phone]

## 2016-04-13 NOTE — Progress Notes (Signed)
Clinical Social Work consult: information on home health services- per patient request.   Deferred to Case Management at this time - RNCM, Cookie made aware.   Inappropriate CSW referral.  If CSW needs arise, please re-consult.   Raynaldo Opitz, [facility] Clinical Social Worker cell #: [phone]

## 2016-04-14 ENCOUNTER — Ambulatory Visit (HOSPITAL_COMMUNITY): Admission: RE | Admit: 2016-04-14 | Payer: Medicare Other | Source: Ambulatory Visit

## 2016-04-14 DIAGNOSIS — E43 Unspecified severe protein-calorie malnutrition: Secondary | ICD-10-CM

## 2016-04-14 DIAGNOSIS — C182 Malignant neoplasm of ascending colon: Secondary | ICD-10-CM

## 2016-04-14 DIAGNOSIS — C787 Secondary malignant neoplasm of liver and intrahepatic bile duct: Secondary | ICD-10-CM

## 2016-04-14 DIAGNOSIS — E119 Type 2 diabetes mellitus without complications: Secondary | ICD-10-CM

## 2016-04-14 DIAGNOSIS — J189 Pneumonia, unspecified organism: Secondary | ICD-10-CM

## 2016-04-14 DIAGNOSIS — C7800 Secondary malignant neoplasm of unspecified lung: Secondary | ICD-10-CM

## 2016-04-14 DIAGNOSIS — E039 Hypothyroidism, unspecified: Secondary | ICD-10-CM

## 2016-04-14 LAB — CBC WITH DIFFERENTIAL/PLATELET
BASOS ABS: 0 10*3/uL (ref 0.0–0.1)
Basophils Relative: 0 %
EOS ABS: 0 10*3/uL (ref 0.0–0.7)
EOS PCT: 0 %
HCT: 27.8 % — ABNORMAL LOW (ref 36.0–46.0)
Hemoglobin: 8.4 g/dL — ABNORMAL LOW (ref 12.0–15.0)
Lymphocytes Relative: 34 %
Lymphs Abs: 2.4 10*3/uL (ref 0.7–4.0)
MCH: 25 pg — AB (ref 26.0–34.0)
MCHC: 30.2 g/dL (ref 30.0–36.0)
MCV: 82.7 fL (ref 78.0–100.0)
Monocytes Absolute: 2.1 10*3/uL — ABNORMAL HIGH (ref 0.1–1.0)
Monocytes Relative: 30 %
Neutro Abs: 2.5 10*3/uL (ref 1.7–7.7)
Neutrophils Relative %: 36 %
PLATELETS: 211 10*3/uL (ref 150–400)
RBC: 3.36 MIL/uL — AB (ref 3.87–5.11)
RDW: 19.3 % — AB (ref 11.5–15.5)
WBC: 7.1 10*3/uL (ref 4.0–10.5)

## 2016-04-14 LAB — COMPREHENSIVE METABOLIC PANEL
ALT: 11 U/L — AB (ref 14–54)
AST: 86 U/L — AB (ref 15–41)
Albumin: 2.2 g/dL — ABNORMAL LOW (ref 3.5–5.0)
Alkaline Phosphatase: 96 U/L (ref 38–126)
Anion gap: 8 (ref 5–15)
BUN: 8 mg/dL (ref 6–20)
CHLORIDE: 112 mmol/L — AB (ref 101–111)
CO2: 20 mmol/L — AB (ref 22–32)
CREATININE: 0.46 mg/dL (ref 0.44–1.00)
Calcium: 9.5 mg/dL (ref 8.9–10.3)
GFR calc non Af Amer: >60 mL/min (ref >60)
Glucose, Bld: 166 mg/dL — ABNORMAL HIGH (ref 65–99)
POTASSIUM: 3.2 mmol/L — AB (ref 3.5–5.1)
SODIUM: 140 mmol/L (ref 135–145)
Total Bilirubin: 0.6 mg/dL (ref 0.3–1.2)
Total Protein: 6.3 g/dL — ABNORMAL LOW (ref 6.5–8.1)

## 2016-04-14 LAB — LEGIONELLA PNEUMOPHILA SEROGP 1 UR AG: L. pneumophila Serogp 1 Ur Ag: NEGATIVE

## 2016-04-14 LAB — GLUCOSE, CAPILLARY
GLUCOSE-CAPILLARY: 96 mg/dL (ref 65–99)
Glucose-Capillary: 116 mg/dL — ABNORMAL HIGH (ref 65–99)

## 2016-04-14 MED ORDER — POTASSIUM CHLORIDE CRYS ER 20 MEQ PO TBCR
60.0000 meq | EXTENDED_RELEASE_TABLET | Freq: Once | ORAL | Status: AC
  Administered 2016-04-14: 60 meq via ORAL
  Filled 2016-04-14: qty 3

## 2016-04-14 MED ORDER — GLIPIZIDE 10 MG PO TABS: 5.0000 mg | ORAL_TABLET | Freq: Every day | ORAL | Status: DC

## 2016-04-14 MED ORDER — DOXYCYCLINE HYCLATE 100 MG PO CAPS: 100.0000 mg | ORAL_CAPSULE | Freq: Two times a day (BID) | ORAL | 0 refills | Status: AC

## 2016-04-14 NOTE — Progress Notes (Signed)
Physical Therapy Treatment Patient Details Name: Judith Stephens MRN: [id number] DOB: 10/31/1940 Today's Date: 04/14/2016    History of Present Illness 75 yo female admitted with fever, sepsis. Hx of met colon cancer-on chemo, HTN, DM    PT Comments    Improved mobility and activity tolerance compared to yesterday. Pt walked ~150 feet without a device/external support. She denied dyspnea. Discussed PT follow up-pt was undecided if she wanted it or not.   Follow Up Recommendations  Home health PT;Supervision - Intermittent (if decides she wants it-she was undecided during session)     Equipment Recommendations  None recommended by PT    Recommendations for Other Services       Precautions / Restrictions Precautions Precautions: Fall Restrictions Weight Bearing Restrictions: No    Mobility  Bed Mobility Overal bed mobility: Modified Independent             General bed mobility comments: Increased time.   Transfers Overall transfer level: Needs assistance   Transfers: Sit to/from Stand Sit to Stand: Min guard         General transfer comment: close guard for safety  Ambulation/Gait Ambulation/Gait assistance: Min guard Ambulation Distance (Feet): 150 Feet Assistive device: None Gait Pattern/deviations: Step-through pattern;Decreased stride length;Wide base of support     General Gait Details: slow gait speed. Pt is mildly unsteady but no overt LOB. Noted widened BOS and increased lateral sway during gait.   Stairs            Wheelchair Mobility    Modified Rankin (Stroke Patients Only)       Balance                                    Cognition Arousal/Alertness: Awake/alert Behavior During Therapy: WFL for tasks assessed/performed Overall Cognitive Status: Within Functional Limits for tasks assessed                      Exercises      General Comments        Pertinent Vitals/Pain Pain Assessment: No/denies pain     Home Living                      Prior Function            PT Goals (current goals can now be found in the care plan section) Progress towards PT goals: Progressing toward goals    Frequency    Min 3X/week      PT Plan Current plan remains appropriate    Co-evaluation             End of Session Equipment Utilized During Treatment: Gait belt Activity Tolerance: Patient tolerated treatment well Patient left: in bed;with call Schrodt/phone within reach     Time: 1019-1030 PT Time Calculation (min) (ACUTE ONLY): 11 min  Charges:  $Gait Training: 8-22 mins                    G Codes:       , MPT Pager: [phone]   

## 2016-04-14 NOTE — Progress Notes (Signed)
Droplet precautions d/c'd since flu and resp panel came back negative. Message left for infection control nurse.

## 2016-04-14 NOTE — Discharge Summary (Signed)
Physician Discharge Summary  Judith Stephens [id number] DOB: 07-24-1940 DOA: 04/12/2016  PCP: Donnie Coffin, MD  Admit date: 04/12/2016 Discharge date: 04/14/2016  Admitted From: Home  Disposition: Home with HHPT  Recommendations for Outpatient Follow-up:  1. Follow up with Dr. Benay Spice on 04/18/16 as scheduled  Home Health: PT  Discharge Condition: Stabilized CODE STATUS: DNR   I had discussion with patient and she says that she already discussed with her husband and he is aware that she wants to be DNR, She does not want resuscitation or intubation at all.   Diet recommendation: Heart Healthy / Carb Modified  Brief/Interim Summary: HPI: Judith Stephens is a 75 y.o. 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 Most recent symptoms of fever since yesterday evening(10/24). Patient reports that she measured her temperature at home and initially measured 99.458F for which she took 2 Tylenol. However, symptoms persisted and subsequent rechecks over the course of next day revealed fever as high as 103F. She called her oncologist's office recommended her to come in for further evaluation. Associated symptoms included dizziness/lightheadedness, diaphoresis, palpitations, intermittent cough with thick sputum production, malaise/fatigue, generalized weakness, and poor appetite.  Denies any recent sick contacts, hemoptysis, wheezing, body aches, muscle pain, nausea, vomiting, diarrhea/constipation, loss of consciousness, leg swelling, or dysuria. Her last chemotherapy therapy session was was on 10/12. She is scheduled to have her next dose of chemotherapy on the 30th this month. Patient had been admitted into the hospital last month thought to have sepsis, but no source of infection was found. She is followed by Dr. Mickey Farber of oncology and  reports that she is scheduled to have a CT scan of the abdomen later this week.  ED Course: Upon admission to the emergency department patient was seen have purulent 101.58F, heart rates up to 127, respirations up to 33, normal vitals relatively within normal limits. Lab work revealed WBC 9.9, hemoglobin 10.4, platelets 269, sodium 133, potassium 3.6, chloride 103, CO2 19, BUN 9, creatinine 0.78, calcium 10.5, glucose 111, lactic acid 4.78. Suspected protocol was initiated and the patient was placed on empiric antibiotics of vancomycin and cefepime considering her recent hospitalization. Chest x-ray was of poor inspiratory effort, but gave suspicion for early infiltrate in the left midlung.  Sepsis secondary to HCAP - treated with IV cefepime and vancomycin, IV fluid resuscitation and supportive measures - Pt rapidly responded to therapy and clinically much improved.  Asking to go home, not willing to stay longer.  I discussed risks with patient and she verbalized understanding but decided to go home with husband to care for her.   Metastatic colon cancer with lung and liver mets - Pt received CT chest / abd / pelvis which showed increase in size of lung and liver lesions.  I discussed results with patient.  She is following up with Dr Benay Spice early next week to review results and discuss treatment options.  I discussed code status with patient and she was clear with me that she was DNR/DNI and says that she already had discussion with her husband and he is also aware.    Hypercalcemia - improved with IV fluid hydration, secondary to metastatic cancer  Hypokalemia - repleted orally and in IVFs.   Hypothyrodism - resume home thyroid supplement.  Elevated AST secondary to metastatic liver disease main stable.  Diabetes mellitus type 2 with  hypoglycemia-likely secondary to glipizide which was held in the hospital and resumed at 50% of the regular dose with hypoglycemia precautions, check blood sugar  closely at home and discontinue glipizide if any blood sugars less than 80.    Essential Hypertension - resume home blood pressure medications.   Severe protein calorie malnutrition - Pt was seen by dietitian and will continue current home supplements for increased calories.  Weight has remained stable but not decreased.    Discharge Diagnoses:  Active Problems:   Colon cancer ([place])   Diabetes mellitus type II, non insulin dependent ([place])   Liver metastasis ([place])   Lung metastasis ([place])   Severe protein-calorie malnutrition ([place])   Pneumonia   Hypercalcemia   Hypothyroidism   Healthcare-associated pneumonia  Discharge Instructions  Discharge Instructions    Increase activity slowly    Complete by:  As directed        Medication List    STOP taking these medications   montelukast 10 MG tablet Commonly known as:  SINGULAIR     TAKE these medications   amLODipine 10 MG tablet Commonly known as:  NORVASC Take 10 mg by mouth daily.   aspirin EC 81 MG tablet Take 81 mg by mouth daily.   doxycycline 100 MG capsule Commonly known as:  VIBRAMYCIN Take 1 capsule (100 mg total) by mouth 2 (two) times daily.   feeding supplement (ENSURE ENLIVE) Liqd Take 237 mLs by mouth 2 (two) times daily between meals.   fluticasone 50 MCG/ACT nasal spray Commonly known as:  FLONASE Place 2 sprays into the nose daily as needed for rhinitis. For allergies   glipiZIDE 10 MG tablet Commonly known as:  GLUCOTROL Take 0.5 tablets (5 mg total) by mouth daily. What changed:  how much to take   HYDROcodone-acetaminophen 5-325 MG tablet Commonly known as:  NORCO/VICODIN Take 1-2 tablets by mouth every 4 (four) hours as needed for moderate pain or severe pain.   levothyroxine 150 MCG tablet Commonly known as:  SYNTHROID, LEVOTHROID Take 150 mcg by mouth daily before breakfast.   lidocaine-prilocaine cream Commonly known as:  EMLA Apply 1 application topically as needed. Apply 1 hr  prior to port access and cover with plastic wrap   loratadine 10 MG tablet Commonly known as:  CLARITIN Take 10 mg by mouth daily as needed for allergies.   LORazepam 0.5 MG tablet Commonly known as:  ATIVAN Take 1 tablet (0.5 mg total) by mouth at bedtime as needed for anxiety.   ondansetron 8 MG tablet Commonly known as:  ZOFRAN Take 1 tablet (8 mg total) by mouth every 8 (eight) hours as needed for nausea or vomiting.   prochlorperazine 5 MG tablet Commonly known as:  COMPAZINE Take 1 tablet (5 mg total) by mouth every 6 (six) hours as needed for nausea or vomiting.   rosuvastatin 10 MG tablet Commonly known as:  CRESTOR Take 10 mg by mouth every Monday, Wednesday, and Friday.      Follow-up Information    Betsy Coder, MD. Go on 04/18/2016.   Specialty:  Oncology Why:  As already scheduled Contact information: [address] [place] [address] [phone]          Allergies  Allergen Reactions  . No Known Allergies Other (See Comments)    Procedures/Studies: Dg Chest 2 View  Result Date: 04/12/2016 CLINICAL DATA:  Fever and cough for 1 day EXAM: CHEST  2 VIEW COMPARISON:  03/16/2016 FINDINGS: Cardiac shadow is stable. Left  chest wall port is again seen and stable. The overall inspiratory effort is poor. Some thickening of the minor fissure is again identified. Slight increased density is noted in the left mid lung just below the port rows wall are which may represent some early infiltrate. No sizable effusion is noted. No bony abnormality is seen. IMPRESSION: Poor inspiratory effort. Chronic changes in the right mid lung. Suspicion of early infiltrate in the left mid lung. Electronically Signed   By: Inez Catalina M.D.   On: 04/12/2016 21:47   Dg Chest 2 View  Result Date: 03/16/2016 CLINICAL DATA:  Tachycardia.  Lower extremity weakness and syncope EXAM: CHEST  2 VIEW COMPARISON:  03/16/2016 FINDINGS: There is a left chest wall port a catheter with  tip at the cavoatrial junction. Heart size is normal. There is asymmetric elevation of the right hemidiaphragm. Thickening along the minor fissure is unchanged from previous study. Bilateral pulmonary nodules are again noted. No airspace consolidation. IMPRESSION: 1. No acute cardiopulmonary abnormalities. 2. Bilateral pulmonary nodules compatible with metastatic disease. Similar to previous exam Electronically Signed   By: Kerby Moors M.D.   On: 03/16/2016 18:33   Dg Chest 2 View  Result Date: 03/16/2016 CLINICAL DATA:  Fever, malignant colonic neoplasm EXAM: CHEST  2 VIEW COMPARISON:  01/31/2016 FINDINGS: Cardiomediastinal silhouette is stable. Left subclavian Port-A-Cath with tip in SVC. No pneumothorax. Small fluid noted in right minor fissure. Bilateral pulmonary nodules are again noted. No segmental infiltrate or pulmonary edema. IMPRESSION: Left subclavian Port-A-Cath with tip in SVC. No pneumothorax. Small fluid noted in right minor fissure. Bilateral pulmonary nodules are again noted. No segmental infiltrate or pulmonary edema. Electronically Signed   By: Lahoma Crocker M.D.   On: 03/16/2016 15:45   Ct Chest W Contrast  Result Date: 04/13/2016 CLINICAL DATA:  Known colonic carcinoma with liver metastatic disease EXAM: CT CHEST, ABDOMEN, AND PELVIS WITH CONTRAST TECHNIQUE: Multidetector CT imaging of the chest, abdomen and pelvis was performed following the standard protocol during bolus administration of intravenous contrast. CONTRAST:  177mL ISOVUE-300 IOPAMIDOL (ISOVUE-300) INJECTION 61% COMPARISON:  01/12/2016 FINDINGS: CT CHEST FINDINGS Cardiovascular: The aorta shows no significant aneurysmal dilatation or dissection. The pulmonary artery is somewhat limited by motion artifact although no large central embolus is noted. Mild coronary calcifications are seen. Cardiac structures are within normal limits. Mediastinum/Nodes: Thoracic inlet is within normal limits. Stable precarinal lymph node is  noted when compare with the prior exam. Subcarinal lymph node has improved now measuring less than 1 cm in short axis. No hilar adenopathy is seen. No axillary adenopathy is noted. Lungs/Pleura: The lungs are well aerated bilaterally. A small right-sided pleural effusion is seen. Scattered nodules are noted throughout both lungs. The largest of these on the left lies in the left upper lobe best seen on image number 56 of series 4 measuring approximately 1.5 cm, increased in size 1.2 cm on the prior exam. Largest lesion on the right lies in the medial aspect of the upper lobe measuring 16 mm in dimension increased from 13 mm on the prior exam. Musculoskeletal: No acute bony abnormality is noted. Degenerative change of the thoracic spine is seen. CT ABDOMEN PELVIS FINDINGS Hepatobiliary: The liver demonstrates innumerable hypodense lesions with peripheral enhancement consistent with metastatic disease. These have increased in both size and number when compared with the prior exam. The large central conglomerate mass measures at least 9.9 cm. Central calcifications are noted as well as changes of cholelithiasis. Pancreas: Unremarkable. No pancreatic ductal  dilatation or surrounding inflammatory changes. Spleen: Normal in size without focal abnormality. Adrenals/Urinary Tract: The adrenal glands are within normal limits. The kidneys demonstrate cystic change on the right as well as some nonobstructing calculi. These changes are stable from the prior exam. The left kidney is within normal limits. Stomach/Bowel: Postsurgical changes are noted consistent with the given clinical history of prior right colectomy. No obstructive changes are seen. Vascular/Lymphatic: Scattered small retroperitoneal lymph nodes are again seen. Peripancreatic and porta hepatis lymph nodes are again noted and relatively stable in appearance. Soft tissue density is noted in the gastrohepatic ligament measuring 19 mm also consistent with  lymphadenopathy. Reproductive: Uterus and bilateral adnexa are unremarkable. Cystic changes again noted in the left ovary. Other: Mild ascites is noted within the pelvis as well as surrounding the liver. Musculoskeletal: No acute bony abnormality is seen. IMPRESSION: CT of the chest: Changes consistent with increase in size of pulmonary parenchymal metastatic disease. Relatively stable to slightly reduced mediastinal adenopathy is noted. Small right pleural effusion is noted. CT of the abdomen: Increase in both size and number of multiple hepatic metastatic lesions. Cholelithiasis stable. Stable lymphadenopathy with the exception of increasing gastrohepatic ligament nodal mass. Changes consistent with her right hemicolectomy. Slight increase in ascites. Electronically Signed   By: Inez Catalina M.D.   On: 04/13/2016 14:21   Ct Abdomen Pelvis W Contrast  Result Date: 04/13/2016 CLINICAL DATA:  Known colonic carcinoma with liver metastatic disease EXAM: CT CHEST, ABDOMEN, AND PELVIS WITH CONTRAST TECHNIQUE: Multidetector CT imaging of the chest, abdomen and pelvis was performed following the standard protocol during bolus administration of intravenous contrast. CONTRAST:  169mL ISOVUE-300 IOPAMIDOL (ISOVUE-300) INJECTION 61% COMPARISON:  01/12/2016 FINDINGS: CT CHEST FINDINGS Cardiovascular: The aorta shows no significant aneurysmal dilatation or dissection. The pulmonary artery is somewhat limited by motion artifact although no large central embolus is noted. Mild coronary calcifications are seen. Cardiac structures are within normal limits. Mediastinum/Nodes: Thoracic inlet is within normal limits. Stable precarinal lymph node is noted when compare with the prior exam. Subcarinal lymph node has improved now measuring less than 1 cm in short axis. No hilar adenopathy is seen. No axillary adenopathy is noted. Lungs/Pleura: The lungs are well aerated bilaterally. A small right-sided pleural effusion is seen.  Scattered nodules are noted throughout both lungs. The largest of these on the left lies in the left upper lobe best seen on image number 56 of series 4 measuring approximately 1.5 cm, increased in size 1.2 cm on the prior exam. Largest lesion on the right lies in the medial aspect of the upper lobe measuring 16 mm in dimension increased from 13 mm on the prior exam. Musculoskeletal: No acute bony abnormality is noted. Degenerative change of the thoracic spine is seen. CT ABDOMEN PELVIS FINDINGS Hepatobiliary: The liver demonstrates innumerable hypodense lesions with peripheral enhancement consistent with metastatic disease. These have increased in both size and number when compared with the prior exam. The large central conglomerate mass measures at least 9.9 cm. Central calcifications are noted as well as changes of cholelithiasis. Pancreas: Unremarkable. No pancreatic ductal dilatation or surrounding inflammatory changes. Spleen: Normal in size without focal abnormality. Adrenals/Urinary Tract: The adrenal glands are within normal limits. The kidneys demonstrate cystic change on the right as well as some nonobstructing calculi. These changes are stable from the prior exam. The left kidney is within normal limits. Stomach/Bowel: Postsurgical changes are noted consistent with the given clinical history of prior right colectomy. No obstructive changes are  seen. Vascular/Lymphatic: Scattered small retroperitoneal lymph nodes are again seen. Peripancreatic and porta hepatis lymph nodes are again noted and relatively stable in appearance. Soft tissue density is noted in the gastrohepatic ligament measuring 19 mm also consistent with lymphadenopathy. Reproductive: Uterus and bilateral adnexa are unremarkable. Cystic changes again noted in the left ovary. Other: Mild ascites is noted within the pelvis as well as surrounding the liver. Musculoskeletal: No acute bony abnormality is seen. IMPRESSION: CT of the chest:  Changes consistent with increase in size of pulmonary parenchymal metastatic disease. Relatively stable to slightly reduced mediastinal adenopathy is noted. Small right pleural effusion is noted. CT of the abdomen: Increase in both size and number of multiple hepatic metastatic lesions. Cholelithiasis stable. Stable lymphadenopathy with the exception of increasing gastrohepatic ligament nodal mass. Changes consistent with her right hemicolectomy. Slight increase in ascites. Electronically Signed   By: Inez Catalina M.D.   On: 04/13/2016 14:21     Subjective: Pt says she felt better and wanted to go home, says her husband will care for her.  Says she will follow up with Dr. Benay Spice early next week (already has appt).  No chest pain or shortness of breath, eating and drinking.   Discharge Exam: Vitals:   04/13/16 2354 04/14/16 0511  BP: (!) 160/89 (!) 148/84  Pulse: (!) 116 97  Resp: 20 16  Temp: 100.3 F (37.9 C) 98.7 F (37.1 C)   Vitals:   04/13/16 1356 04/13/16 1500 04/13/16 2354 04/14/16 0511  BP: (!) 143/79  (!) 160/89 (!) 148/84  Pulse: (!) 102  (!) 116 97  Resp: 20  20 16   Temp: (!) 102.1 F (38.9 C) 99.1 F (37.3 C) 100.3 F (37.9 C) 98.7 F (37.1 C)  TempSrc: Oral Oral Oral Oral  SpO2: 99%   100%  Weight:      Height:       General: Pt is alert, awake, not in acute distress, cooperative, pleasant Cardiovascular: RRR, S1/S2 +, no rubs, no gallops Respiratory: rare crackles left lung, no wheezing, no rhonchi Abdominal: Soft, NT, ND, bowel sounds + Extremities: no edema, no cyanosis Neuro: nonfocal  The results of significant diagnostics from this hospitalization (including imaging, microbiology, ancillary and laboratory) are listed below for reference.    Microbiology: Recent Results (from the past 240 hour(s))  Respiratory Panel by PCR     Status: None   Collection Time: 04/12/16  8:44 PM  Result Value Ref Range Status   Adenovirus NOT DETECTED NOT DETECTED Final    Coronavirus 229E NOT DETECTED NOT DETECTED Final   Coronavirus HKU1 NOT DETECTED NOT DETECTED Final   Coronavirus NL63 NOT DETECTED NOT DETECTED Final   Coronavirus OC43 NOT DETECTED NOT DETECTED Final   Metapneumovirus NOT DETECTED NOT DETECTED Final   Rhinovirus / Enterovirus NOT DETECTED NOT DETECTED Final   Influenza A NOT DETECTED NOT DETECTED Final   Influenza B NOT DETECTED NOT DETECTED Final   Parainfluenza Virus 1 NOT DETECTED NOT DETECTED Final   Parainfluenza Virus 2 NOT DETECTED NOT DETECTED Final   Parainfluenza Virus 3 NOT DETECTED NOT DETECTED Final   Parainfluenza Virus 4 NOT DETECTED NOT DETECTED Final   Respiratory Syncytial Virus NOT DETECTED NOT DETECTED Final   Bordetella pertussis NOT DETECTED NOT DETECTED Final   Chlamydophila pneumoniae NOT DETECTED NOT DETECTED Final   Mycoplasma pneumoniae NOT DETECTED NOT DETECTED Final    Comment: Performed at [facility]  Urine culture     Status: None (Preliminary  result)   Collection Time: 04/12/16  9:30 PM  Result Value Ref Range Status   Specimen Description URINE, RANDOM  Final   Special Requests NONE  Final   Culture   Final    CULTURE REINCUBATED FOR BETTER GROWTH Performed at [facility]    Report Status PENDING  Incomplete  Culture, blood (Routine X 2)     Status: None (Preliminary result)   Collection Time: 04/12/16  9:51 PM  Result Value Ref Range Status   Specimen Description BLOOD LEFT ANTECUBITAL  Final   Special Requests BOTTLES DRAWN AEROBIC AND ANAEROBIC 5CC  Final   Culture   Final    NO GROWTH < 24 HOURS Performed at [facility]    Report Status PENDING  Incomplete     Labs: BNP (last 3 results) No results for input(s): BNP in the last 8760 hours. Basic Metabolic Panel:  Recent Labs Lab 04/12/16 2151 04/13/16 0516 04/14/16 0520  NA 133* 140 140  K 3.6 3.2* 3.2*  CL 103 113* 112*  CO2 19* 19* 20*  GLUCOSE 111* 72 166*  BUN 9 8 8   CREATININE 0.78 0.63 0.46   CALCIUM 10.5* 9.7 9.5   Liver Function Tests:  Recent Labs Lab 04/12/16 2151 04/13/16 0516 04/14/16 0520  AST 140* 115* 86*  ALT 14 12* 11*  ALKPHOS 119 101 96  BILITOT 0.6 0.7 0.6  PROT 7.7 6.6 6.3*  ALBUMIN 2.8* 2.5* 2.2*   No results for input(s): LIPASE, AMYLASE in the last 168 hours. No results for input(s): AMMONIA in the last 168 hours. CBC:  Recent Labs Lab 04/12/16 2241 04/13/16 0516 04/14/16 0520  WBC 9.9 8.6 7.1  NEUTROABS 4.2  --  2.5  HGB 10.4* 9.3* 8.4*  HCT 33.6* 31.3* 27.8*  MCV 83.0 85.1 82.7  PLT 269 223 211   Cardiac Enzymes: No results for input(s): CKTOTAL, CKMB, CKMBINDEX, TROPONINI in the last 168 hours. BNP: Invalid input(s): POCBNP CBG:  Recent Labs Lab 04/13/16 0920 04/13/16 1008 04/13/16 1222 04/13/16 1729 04/14/16 0744  GLUCAP 61* 77 84 147* 116*   D-Dimer No results for input(s): DDIMER in the last 72 hours. Hgb A1c No results for input(s): HGBA1C in the last 72 hours. Lipid Profile No results for input(s): CHOL, HDL, LDLCALC, TRIG, CHOLHDL, LDLDIRECT in the last 72 hours. Thyroid function studies No results for input(s): TSH, T4TOTAL, T3FREE, THYROIDAB in the last 72 hours.  Invalid input(s): FREET3 Anemia work up No results for input(s): VITAMINB12, FOLATE, FERRITIN, TIBC, IRON, RETICCTPCT in the last 72 hours. Urinalysis    Component Value Date/Time   COLORURINE AMBER (A) 04/12/2016 2130   APPEARANCEUR CLOUDY (A) 04/12/2016 2130   LABSPEC 1.015 04/12/2016 2130   PHURINE 7.0 04/12/2016 2130   GLUCOSEU NEGATIVE 04/12/2016 2130   HGBUR SMALL (A) 04/12/2016 2130   BILIRUBINUR NEGATIVE 04/12/2016 2130   [place] NEGATIVE 04/12/2016 2130   PROTEINUR 30 (A) 04/12/2016 2130   NITRITE NEGATIVE 04/12/2016 2130   LEUKOCYTESUR NEGATIVE 04/12/2016 2130   Sepsis Labs Invalid input(s): PROCALCITONIN,  WBC,  LACTICIDVEN Microbiology Recent Results (from the past 240 hour(s))  Respiratory Panel by PCR     Status: None    Collection Time: 04/12/16  8:44 PM  Result Value Ref Range Status   Adenovirus NOT DETECTED NOT DETECTED Final   Coronavirus 229E NOT DETECTED NOT DETECTED Final   Coronavirus HKU1 NOT DETECTED NOT DETECTED Final   Coronavirus NL63 NOT DETECTED NOT DETECTED Final   Coronavirus  OC43 NOT DETECTED NOT DETECTED Final   Metapneumovirus NOT DETECTED NOT DETECTED Final   Rhinovirus / Enterovirus NOT DETECTED NOT DETECTED Final   Influenza A NOT DETECTED NOT DETECTED Final   Influenza B NOT DETECTED NOT DETECTED Final   Parainfluenza Virus 1 NOT DETECTED NOT DETECTED Final   Parainfluenza Virus 2 NOT DETECTED NOT DETECTED Final   Parainfluenza Virus 3 NOT DETECTED NOT DETECTED Final   Parainfluenza Virus 4 NOT DETECTED NOT DETECTED Final   Respiratory Syncytial Virus NOT DETECTED NOT DETECTED Final   Bordetella pertussis NOT DETECTED NOT DETECTED Final   Chlamydophila pneumoniae NOT DETECTED NOT DETECTED Final   Mycoplasma pneumoniae NOT DETECTED NOT DETECTED Final    Comment: Performed at [facility]  Urine culture     Status: None (Preliminary result)   Collection Time: 04/12/16  9:30 PM  Result Value Ref Range Status   Specimen Description URINE, RANDOM  Final   Special Requests NONE  Final   Culture   Final    CULTURE REINCUBATED FOR BETTER GROWTH Performed at [facility]    Report Status PENDING  Incomplete  Culture, blood (Routine X 2)     Status: None (Preliminary result)   Collection Time: 04/12/16  9:51 PM  Result Value Ref Range Status   Specimen Description BLOOD LEFT ANTECUBITAL  Final   Special Requests BOTTLES DRAWN AEROBIC AND ANAEROBIC 5CC  Final   Culture   Final    NO GROWTH < 24 HOURS Performed at [facility]    Report Status PENDING  Incomplete   Time coordinating discharge: 31 minutes  SIGNED:  Irwin Brakeman, MD  [facility] 04/14/2016, 10:08 AM Pager   If 7PM-7AM, please contact night-coverage [email or web address] Password  [email or web address]

## 2016-04-14 NOTE — Discharge Instructions (Signed)
Sepsis, Adult Sepsis is a serious infection of your blood or tissues that affects your whole body. The infection that causes sepsis may be bacterial, viral, fungal, or parasitic. Sepsis may be life threatening. Sepsis can cause your blood pressure to drop. This may result in shock. Shock causes your central nervous system and your organs to stop working correctly.  RISK FACTORS Sepsis can happen in anyone, but it is more likely to happen in people who have weakened immune systems. SIGNS AND SYMPTOMS  Symptoms of sepsis can include:  Fever or low body temperature (hypothermia).  Rapid breathing (hyperventilation).  Chills.  Rapid heartbeat (tachycardia).  Confusion or light-headedness.  Trouble breathing.  Urinating much less than usual.  Cool, clammy skin or red, flushed skin.  Other problems with the heart, kidneys, or brain. DIAGNOSIS  Your health care provider will likely do tests to look for an infection, to see if the infection has spread to your blood, and to see how serious your condition is. Tests can include:  Blood tests, including cultures of your blood.  Cultures of other fluids from your body, such as:  Urine.  Pus from wounds.  Mucus coughed up from your lungs.  Urine tests other than cultures.  X-ray exams or other imaging tests. TREATMENT  Treatment will begin with elimination of the source of infection. If your sepsis is likely caused by a bacterial or fungal infection, you will be given antibiotic or antifungal medicines. You may also receive:  Oxygen.  Fluids through an IV tube.  Medicines to increase your blood pressure.  A machine to clean your blood (dialysis) if your kidneys fail.  A machine to help you breathe if your lungs fail. SEEK IMMEDIATE MEDICAL CARE IF: You get an infection or develop any of the signs and symptoms of sepsis after surgery or a hospitalization.   This information is not intended to replace advice given to you by  your health care provider. Make sure you discuss any questions you have with your health care provider.   Document Released: 03/04/2003 Document Revised: 10/20/2014 Document Reviewed: 02/10/2013 Elsevier Interactive Patient Education 2016 [employer].   Antibiotic Medicine Antibiotic medicines are used to treat infections caused by bacteria. They work by hurting or killing the germs that are making you sick. HOW WILL MY MEDICINE BE PICKED? There are many kinds of antibiotic medicines. To help your doctor pick one, tell your doctor if:  You have any allergies.  You are pregnant or plan to get pregnant.  You are breastfeeding.  You are taking any medicines. These include over-the-counter medicines, prescription medicines, and herbal remedies.  You have a medical condition or problem. If you have questions about why your medicine was picked, ask. FOR HOW LONG SHOULD I TAKE MY MEDICINE? Take your medicine for as long as your doctor tells you to. Do not stop taking it when you feel better. If you stop taking it too soon:  You may start to feel sick again.  Your infection may get harder to treat.  New problems may develop. WHAT IF I MISS A DOSE? Try not to miss any doses of antibiotic medicine. If you miss a dose:  Take the dose as soon as you can.  If you are taking 2 doses a day, take the next dose in 5 to 6 hours.  If you are taking 3 or more doses a day, take the next dose in 2 to 4 hours. Then go back to the normal schedule. If  you cannot take a missed dose, take the next dose on time. Then take the missed dose after you have taken all the doses as told by your doctor, as if you had one more dose left. DOES THIS MEDICINE AFFECT BIRTH CONTROL? Birth control pills may not work while you are on antibiotic medicines. If you are taking birth control pills, keep taking them as usual. Use a second form of birth control, such as a condom. Keep using the second form of birth control  until you are finished with your current 1 month cycle of birth control pills. GET HELP IF:  You get worse.  You do not feel better a few days after starting the medicine.  You throw up (vomit).  There are white patches in your mouth.  You have new joint pain after starting the medicine.  You have new muscle aches after starting the medicine.  You had a fever before starting the medicine, and it comes back.  You have any symptoms of an allergic reaction, such as an itchy rash. If this happens, stop taking the medicine. GET HELP RIGHT AWAY IF:  Your pee (urine) turns dark or becomes blood-colored.  Your skin turns yellow.  You bruise or bleed easily.  You have very bad watery poop (diarrhea) and cramps in your belly (abdomen).  You have a very bad headache.  You have signs of a very bad allergic reaction, such as:  Trouble breathing.  Wheezing.  Swelling of the lips, tongue, or face.  Fainting.  Blisters on the skin or in the mouth. If you have signs of a very bad allergic reaction, stop taking the antibiotic medicine right away.   This information is not intended to replace advice given to you by your health care provider. Make sure you discuss any questions you have with your health care provider.   Document Released: 03/14/2008 Document Revised: 02/24/2015 Document Reviewed: 10/21/2014 Elsevier Interactive Patient Education 2016 [employer] Inc.    Dehydration, Adult Dehydration means your body does not have as much fluid or water as it needs. It happens when you take in less fluid than you lose. Your kidneys, brain, and heart will not work properly without the right amount of fluids.  Dehydration can range from mild to severe. It should be treated right away to help prevent it from becoming severe. HOME CARE  Drink enough fluid to keep your pee (urine) clear or pale yellow.  Drink water or fluid slowly by taking small sips. You can also try sucking on ice  cubes.  Have food or drinks that contain electrolytes. Examples include bananas and sports drinks.  Take over-the-counter and prescription medicines only as told by your doctor.  Prepare oral rehydration solution (ORS) according to the instructions that came with it. Take sips of ORS every 5 minutes until your pee returns to normal.  If you are throwing up (vomiting) or have watery poop (diarrhea), keep trying to drink water, ORS, or both.  If you have watery poop, avoid:  Drinks with caffeine.  Fruit juice.  Milk.  Carbonated soft drinks.  Do not take salt tablets. This can lead to having too much sodium in your body (hypernatremia). GET HELP IF:  You cannot eat or drink without throwing up.  You have had mild watery poop for longer than 24 hours.  You have a fever. GET HELP RIGHT AWAY IF:   You have very strong thirst.  You have very bad watery poop.  You have not  peed in 6-8 hours, or you have peed only a small amount of very dark pee.  You have shriveled skin.  You are dizzy, confused, or both.   This information is not intended to replace advice given to you by your health care provider. Make sure you discuss any questions you have with your health care provider.   Document Released: 04/01/2009 Document Revised: 02/24/2015 Document Reviewed: 10/21/2014 Elsevier Interactive Patient Education [employer].

## 2016-04-15 LAB — URINE CULTURE

## 2016-04-17 ENCOUNTER — Encounter: Payer: Self-pay | Admitting: Pharmacist

## 2016-04-17 LAB — CULTURE, BLOOD (ROUTINE X 2): Culture: NO GROWTH

## 2016-04-18 ENCOUNTER — Ambulatory Visit (HOSPITAL_BASED_OUTPATIENT_CLINIC_OR_DEPARTMENT_OTHER): Payer: Medicare Other | Admitting: Oncology

## 2016-04-18 ENCOUNTER — Other Ambulatory Visit (HOSPITAL_BASED_OUTPATIENT_CLINIC_OR_DEPARTMENT_OTHER): Payer: Medicare Other | Admitting: *Deleted

## 2016-04-18 ENCOUNTER — Ambulatory Visit (HOSPITAL_BASED_OUTPATIENT_CLINIC_OR_DEPARTMENT_OTHER): Payer: Medicare Other

## 2016-04-18 ENCOUNTER — Other Ambulatory Visit: Payer: Self-pay | Admitting: Oncology

## 2016-04-18 ENCOUNTER — Other Ambulatory Visit (HOSPITAL_BASED_OUTPATIENT_CLINIC_OR_DEPARTMENT_OTHER): Payer: Medicare Other

## 2016-04-18 VITALS — Wt 188.0 lb

## 2016-04-18 VITALS — BP 135/72 | HR 118 | Temp 97.8°F | Resp 20 | Wt 190.2 lb

## 2016-04-18 DIAGNOSIS — D509 Iron deficiency anemia, unspecified: Secondary | ICD-10-CM

## 2016-04-18 DIAGNOSIS — E119 Type 2 diabetes mellitus without complications: Secondary | ICD-10-CM | POA: Diagnosis not present

## 2016-04-18 DIAGNOSIS — C182 Malignant neoplasm of ascending colon: Secondary | ICD-10-CM

## 2016-04-18 DIAGNOSIS — I10 Essential (primary) hypertension: Secondary | ICD-10-CM | POA: Diagnosis not present

## 2016-04-18 DIAGNOSIS — C787 Secondary malignant neoplasm of liver and intrahepatic bile duct: Secondary | ICD-10-CM

## 2016-04-18 DIAGNOSIS — G620 Drug-induced polyneuropathy: Secondary | ICD-10-CM | POA: Diagnosis not present

## 2016-04-18 DIAGNOSIS — R509 Fever, unspecified: Secondary | ICD-10-CM | POA: Diagnosis not present

## 2016-04-18 DIAGNOSIS — Z5112 Encounter for antineoplastic immunotherapy: Secondary | ICD-10-CM

## 2016-04-18 DIAGNOSIS — Z452 Encounter for adjustment and management of vascular access device: Secondary | ICD-10-CM | POA: Diagnosis present

## 2016-04-18 DIAGNOSIS — Z5111 Encounter for antineoplastic chemotherapy: Secondary | ICD-10-CM

## 2016-04-18 DIAGNOSIS — C7800 Secondary malignant neoplasm of unspecified lung: Secondary | ICD-10-CM

## 2016-04-18 DIAGNOSIS — Z95828 Presence of other vascular implants and grafts: Secondary | ICD-10-CM

## 2016-04-18 LAB — CBC WITH DIFFERENTIAL/PLATELET
BASO%: 0.2 % (ref 0.0–2.0)
Basophils Absolute: 0 10*3/uL (ref 0.0–0.1)
EOS ABS: 0 10*3/uL (ref 0.0–0.5)
EOS%: 0.1 % (ref 0.0–7.0)
HCT: 29 % — ABNORMAL LOW (ref 34.8–46.6)
HEMOGLOBIN: 9 g/dL — AB (ref 11.6–15.9)
LYMPH%: 9.8 % — AB (ref 14.0–49.7)
MCH: 24.7 pg — ABNORMAL LOW (ref 25.1–34.0)
MCHC: 31 g/dL — ABNORMAL LOW (ref 31.5–36.0)
MCV: 79.6 fL (ref 79.5–101.0)
MONO#: 2.1 10*3/uL — AB (ref 0.1–0.9)
MONO%: 13.4 % (ref 0.0–14.0)
NEUT%: 76.5 % (ref 38.4–76.8)
NEUTROS ABS: 11.7 10*3/uL — AB (ref 1.5–6.5)
Platelets: 291 10*3/uL (ref 145–400)
RBC: 3.64 10*6/uL — AB (ref 3.70–5.45)
RDW: 20.3 % — AB (ref 11.2–14.5)
WBC: 15.3 10*3/uL — AB (ref 3.9–10.3)
lymph#: 1.5 10*3/uL (ref 0.9–3.3)

## 2016-04-18 LAB — COMPREHENSIVE METABOLIC PANEL
ALK PHOS: 172 U/L — AB (ref 40–150)
ALT: 11 U/L (ref 0–55)
AST: 110 U/L — ABNORMAL HIGH (ref 5–34)
Anion Gap: 11 mEq/L (ref 3–11)
BILIRUBIN TOTAL: 0.4 mg/dL (ref 0.20–1.20)
BUN: 10.7 mg/dL (ref 7.0–26.0)
CO2: 21 meq/L — AB (ref 22–29)
CREATININE: 0.6 mg/dL (ref 0.6–1.1)
Calcium: 11 mg/dL — ABNORMAL HIGH (ref 8.4–10.4)
Chloride: 111 mEq/L — ABNORMAL HIGH (ref 98–109)
GLUCOSE: 68 mg/dL — AB (ref 70–140)
Potassium: 3.3 mEq/L — ABNORMAL LOW (ref 3.5–5.1)
SODIUM: 143 meq/L (ref 136–145)
TOTAL PROTEIN: 7.3 g/dL (ref 6.4–8.3)

## 2016-04-18 LAB — UA PROTEIN, DIPSTICK - CHCC: Protein, ur: 30 mg/dL

## 2016-04-18 LAB — CULTURE, BLOOD (ROUTINE X 2): Culture: NO GROWTH

## 2016-04-18 LAB — CEA (IN HOUSE-CHCC): CEA (CHCC-In House): 117.41 ng/mL — ABNORMAL HIGH (ref 0.00–5.00)

## 2016-04-18 MED ORDER — IRINOTECAN HCL CHEMO INJECTION 100 MG/5ML
125.0000 mg/m2 | Freq: Once | INTRAVENOUS | Status: AC
  Administered 2016-04-18: 260 mg via INTRAVENOUS
  Filled 2016-04-18: qty 5

## 2016-04-18 MED ORDER — PALONOSETRON HCL INJECTION 0.25 MG/5ML
0.2500 mg | Freq: Once | INTRAVENOUS | Status: AC
  Administered 2016-04-18: 0.25 mg via INTRAVENOUS

## 2016-04-18 MED ORDER — SODIUM CHLORIDE 0.9 % IJ SOLN
10.0000 mL | INTRAMUSCULAR | Status: DC | PRN
  Administered 2016-04-18: 10 mL via INTRAVENOUS
  Filled 2016-04-18: qty 10

## 2016-04-18 MED ORDER — LEUCOVORIN CALCIUM INJECTION 350 MG
400.0000 mg/m2 | Freq: Once | INTRAVENOUS | Status: AC
  Administered 2016-04-18: 828 mg via INTRAVENOUS
  Filled 2016-04-18: qty 41.4

## 2016-04-18 MED ORDER — SODIUM CHLORIDE 0.9 % IV SOLN: 10.0000 mg | Freq: Once | INTRAVENOUS | Status: DC

## 2016-04-18 MED ORDER — DEXAMETHASONE SODIUM PHOSPHATE 10 MG/ML IJ SOLN
INTRAMUSCULAR | Status: AC
  Filled 2016-04-18: qty 1

## 2016-04-18 MED ORDER — ATROPINE SULFATE 1 MG/ML IJ SOLN: 0.5000 mg | Freq: Once | INTRAMUSCULAR | Status: DC | PRN

## 2016-04-18 MED ORDER — SODIUM CHLORIDE 0.9 % IV SOLN
Freq: Once | INTRAVENOUS | Status: AC
  Administered 2016-04-18: 12:00:00 via INTRAVENOUS

## 2016-04-18 MED ORDER — SODIUM CHLORIDE 0.9 % IV SOLN
2404.0000 mg/m2 | INTRAVENOUS | Status: DC
  Administered 2016-04-18: 5000 mg via INTRAVENOUS
  Filled 2016-04-18: qty 100

## 2016-04-18 MED ORDER — DEXAMETHASONE SODIUM PHOSPHATE 10 MG/ML IJ SOLN
10.0000 mg | Freq: Once | INTRAMUSCULAR | Status: AC
  Administered 2016-04-18: 10 mg via INTRAVENOUS

## 2016-04-18 MED ORDER — SODIUM CHLORIDE 0.9 % IV SOLN
4.8000 mg/kg | Freq: Once | INTRAVENOUS | Status: AC
  Administered 2016-04-18: 400 mg via INTRAVENOUS
  Filled 2016-04-18: qty 16

## 2016-04-18 MED ORDER — PALONOSETRON HCL INJECTION 0.25 MG/5ML
INTRAVENOUS | Status: AC
  Filled 2016-04-18: qty 5

## 2016-04-18 MED ORDER — ATROPINE SULFATE 1 MG/ML IJ SOLN
INTRAMUSCULAR | Status: AC
  Filled 2016-04-18: qty 1

## 2016-04-18 NOTE — Progress Notes (Signed)
Reviewed pt labs (CMP) with Dr. Benay Spice; ok to treat with AST 110.

## 2016-04-18 NOTE — Patient Instructions (Addendum)
[facility] Discharge Instructions for Patients Receiving Chemotherapy  Today you received the following chemotherapy agents Avastin/Irinotecan/Leucovorin/Fluorouracil.  To help prevent nausea and vomiting after your treatment, we encourage you to take your nausea medication as directed.  If you develop nausea and vomiting that is not controlled by your nausea medication, call the clinic.   BELOW ARE SYMPTOMS THAT SHOULD BE REPORTED IMMEDIATELY:  *FEVER GREATER THAN 100.5 F  *CHILLS WITH OR WITHOUT FEVER  NAUSEA AND VOMITING THAT IS NOT CONTROLLED WITH YOUR NAUSEA MEDICATION  *UNUSUAL SHORTNESS OF BREATH  *UNUSUAL BRUISING OR BLEEDING  TENDERNESS IN MOUTH AND THROAT WITH OR WITHOUT PRESENCE OF ULCERS  *URINARY PROBLEMS  *BOWEL PROBLEMS  UNUSUAL RASH Items with * indicate a potential emergency and should be followed up as soon as possible.  Feel free to call the clinic you have any questions or concerns. The clinic phone number is [phone].  Please show the [facility] at check-in to the Emergency Department and triage nurse.   Irinotecan injection What is this medicine? IRINOTECAN (ir in oh TEE kan ) is a chemotherapy drug. It is used to treat colon and rectal cancer. This medicine may be used for other purposes; ask your health care provider or pharmacist if you have questions. What should I tell my health care provider before I take this medicine? They need to know if you have any of these conditions: -blood disorders -dehydration -diarrhea -infection (especially a virus infection such as chickenpox, cold sores, or herpes) -liver disease -low blood counts, like low white cell, platelet, or red cell counts -recent or ongoing radiation therapy -an unusual or allergic reaction to irinotecan, sorbitol, other chemotherapy, other medicines, foods, dyes, or preservatives -pregnant or trying to get pregnant -breast-feeding How should I use this  medicine? This drug is given as an infusion into a vein. It is administered in a hospital or clinic by a specially trained health care professional. Talk to your pediatrician regarding the use of this medicine in children. Special care may be needed. Overdosage: If you think you have taken too much of this medicine contact a poison control center or emergency room at once. NOTE: This medicine is only for you. Do not share this medicine with others. What if I miss a dose? It is important not to miss your dose. Call your doctor or health care professional if you are unable to keep an appointment. What may interact with this medicine? Do not take this medicine with any of the following medications: -atazanavir -certain medicines for fungal infections like itraconazole and ketoconazole -St. John's Wort This medicine may also interact with the following medications: -dexamethasone -diuretics -laxatives -medicines for seizures like carbamazepine, mephobarbital, phenobarbital, phenytoin, primidone -medicines to increase blood counts like filgrastim, pegfilgrastim, sargramostim -prochlorperazine -vaccines This list may not describe all possible interactions. Give your health care provider a list of all the medicines, herbs, non-prescription drugs, or dietary supplements you use. Also tell them if you smoke, drink alcohol, or use illegal drugs. Some items may interact with your medicine. What should I watch for while using this medicine? Your condition will be monitored carefully while you are receiving this medicine. You will need important blood work done while you are taking this medicine. This drug may make you feel generally unwell. This is not uncommon, as chemotherapy can affect healthy cells as well as cancer cells. Report any side effects. Continue your course of treatment even though you feel ill unless your doctor tells  you to stop. In some cases, you may be given additional medicines to  help with side effects. Follow all directions for their use. You may get drowsy or dizzy. Do not drive, use machinery, or do anything that needs mental alertness until you know how this medicine affects you. Do not stand or sit up quickly, especially if you are an older patient. This reduces the risk of dizzy or fainting spells. Call your doctor or health care professional for advice if you get a fever, chills or sore throat, or other symptoms of a cold or flu. Do not treat yourself. This drug decreases your body's ability to fight infections. Try to avoid being around people who are sick. This medicine may increase your risk to bruise or bleed. Call your doctor or health care professional if you notice any unusual bleeding. Be careful brushing and flossing your teeth or using a toothpick because you may get an infection or bleed more easily. If you have any dental work done, tell your dentist you are receiving this medicine. Avoid taking products that contain aspirin, acetaminophen, ibuprofen, naproxen, or ketoprofen unless instructed by your doctor. These medicines may hide a fever. Do not become pregnant while taking this medicine. Women should inform their doctor if they wish to become pregnant or think they might be pregnant. There is a potential for serious side effects to an unborn child. Talk to your health care professional or pharmacist for more information. Do not breast-feed an infant while taking this medicine. What side effects may I notice from receiving this medicine? Side effects that you should report to your doctor or health care professional as soon as possible: -allergic reactions like skin rash, itching or hives, swelling of the face, lips, or tongue -low blood counts - this medicine may decrease the number of white blood cells, red blood cells and platelets. You may be at increased risk for infections and bleeding. -signs of infection - fever or chills, cough, sore throat, pain or  difficulty passing urine -signs of decreased platelets or bleeding - bruising, pinpoint red spots on the skin, black, tarry stools, blood in the urine -signs of decreased red blood cells - unusually weak or tired, fainting spells, lightheadedness -breathing problems -chest pain -diarrhea -feeling faint or lightheaded, falls -flushing, runny nose, sweating during infusion -mouth sores or pain -pain, swelling, redness or irritation where injected -pain, swelling, warmth in the leg -pain, tingling, numbness in the hands or feet -problems with balance, talking, walking -stomach cramps, pain -trouble passing urine or change in the amount of urine -vomiting as to be unable to hold down drinks or food -yellowing of the eyes or skin Side effects that usually do not require medical attention (report to your doctor or health care professional if they continue or are bothersome): -constipation -hair loss -headache -loss of appetite -nausea, vomiting -stomach upset This list may not describe all possible side effects. Call your doctor for medical advice about side effects. You may report side effects to FDA at 1-800-FDA-1088. Where should I keep my medicine? This drug is given in a hospital or clinic and will not be stored at home. NOTE: This sheet is a summary. It may not cover all possible information. If you have questions about this medicine, talk to your doctor, pharmacist, or health care provider.    2016, Elsevier/Gold Standard. (2012-12-02 16:29:32)   [facility] Discharge Instructions for Patients Receiving Chemotherapy  Today you received the following chemotherapy agents irinotecan/flouroracil/leucovorin/avastin  To  help prevent nausea and vomiting after your treatment, we encourage you to take your nausea medication as directed  If you develop nausea and vomiting that is not controlled by your nausea medication, call the clinic.   BELOW ARE SYMPTOMS THAT SHOULD  BE REPORTED IMMEDIATELY:  *FEVER GREATER THAN 100.5 F  *CHILLS WITH OR WITHOUT FEVER  NAUSEA AND VOMITING THAT IS NOT CONTROLLED WITH YOUR NAUSEA MEDICATION  *UNUSUAL SHORTNESS OF BREATH  *UNUSUAL BRUISING OR BLEEDING  TENDERNESS IN MOUTH AND THROAT WITH OR WITHOUT PRESENCE OF ULCERS  *URINARY PROBLEMS  *BOWEL PROBLEMS  UNUSUAL RASH Items with * indicate a potential emergency and should be followed up as soon as possible.  Feel free to call the clinic you have any questions or concerns. The clinic phone number is [phone].

## 2016-04-18 NOTE — Progress Notes (Signed)
Judith Stephens OFFICE PROGRESS NOTE   Diagnosis: Colon cancer  INTERVAL HISTORY:   Judith Stephens was admitted with a fever on 04/12/2016. She was admitted for further evaluation. There was a suspicion of pneumonia on chest x-ray. She was discharged on 04/14/2016 and is completing outpatient course of doxycycline. Judith Stephens complains of "weakness ". She has anorexia, but is using nutrition supplements. A CT of the chest, abdomen, and pelvis on 04/13/2016 confirmed progression of colon cancer involving the lungs and liver.  Objective:  Vital signs in last 24 hours:  Blood pressure 135/72, pulse (!) 118, temperature 97.8 F (36.6 C), temperature source Oral, resp. rate 20, weight 190 lb 3.2 oz (86.3 kg), SpO2 99 %.    HEENT: No thrush or ulcers Resp: Coarse rhonchi at the posterior base bilaterally, no respiratory distress Cardio: Regular rate and rhythm, tachycardia GI: The liver edge is palpable in the mid upper abdomen Vascular: No leg edema  Skin: Hyperpigmentation and dryness of the palms   Portacath/PICC-without erythema  Lab Results:  Lab Results  Component Value Date   WBC 15.3 (H) 04/18/2016   HGB 9.0 (L) 04/18/2016   HCT 29.0 (L) 04/18/2016   MCV 79.6 04/18/2016   PLT 291 04/18/2016   NEUTROABS 11.7 (H) 04/18/2016     Imaging:  No results found.  Medications: I have reviewed the patient's current medications.  Assessment/Plan: 1. 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 She began adjuvant Xeloda on 05/14/2012. Final cycle of Xeloda started on 10/22/2012.  CT abdomen/pelvis 01/10/2016-innumerable hepatic metastases  CT chest 01/12/2016-multiple pulmonary nodules consistent with metastases  Cycle 1 FOLFOX 02/01/2016  Cycle 2 FOLFOX 02/15/2016  Cycle 3 FOLFOX 02/29/2016  Cycle 4 FOLFOX 03/14/2016  Cycle 5 FOLFOX 03/28/2016  CTs 04/13/2016-progressive lung  and liver metastases  Cycle 1 FOLFIRI/Avastin 04/18/2016 2. Severe microcytic anemia likely secondary to iron deficiency related to bleeding from the colon tumor. The hemoglobin and MCV improved on oral iron. Oral iron was discontinued following office visit 12/16/2012.  Recurrent microcytic anemia July 2017  Stool positive for occult blood 02/01/2016  Referred to GI 3. Diabetes. 4. Hypertension. 5. Hyperlipidemia. 6. History of hand/foot syndrome secondary to Xeloda prompting the Xeloda to be discontinued after day 12, cycle 2. Improved. Xeloda was dose reduced beginning with cycle 3. 7. History of mild diarrhea secondary to Xeloda.  8. Hypercalcemia-Zometa initiated 04/18/2016 9. History of eye tearing-most likely related to seasonal allergies though this could be a manifestation of Xeloda toxicity.  10. Status post root canal 10/02/2012. 11. Colonoscopy 03/13/2013 by Dr. Amedeo Plenty. Pathology on transverse colon polyp showed a tubular adenoma. Next colonoscopy recommendedat a three-year interval. 12. Anorexia/weight loss 13. Port-A-Cath placement by Dr. Dalbert Batman 01/31/2016 14. Early hand-foot syndrome secondary to 5-fluorouracil 15. 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 The CTs on 04/13/2016 confirmed progressive metastatic disease involving the lungs and liver. I suspect Judith fever is related to "tumor fever ". She has developed hypercalcemia.  I discussed treatment options with Judith Stephens Judith Stephens. We discussed supportive/comfort care versus salvage chemotherapy. She would like to proceed with a trial of salvage therapy. I recommend FOLFIRI/Avastin. We reviewed the potential toxicities associated with this regimen including the chance of diarrhea, hematologic toxicity, and nausea/vomiting. We discussed the side effects associated with Avastin.  She agrees to proceed.  Ms.  Stephens will return for an office visit in one week. I encouraged Judith to continue the nutrition supplements. We will arrange for a home PT/OT evaluation.  Betsy Coder, MD  04/18/2016  10:47 AM

## 2016-04-20 ENCOUNTER — Ambulatory Visit (HOSPITAL_BASED_OUTPATIENT_CLINIC_OR_DEPARTMENT_OTHER): Payer: Medicare Other

## 2016-04-20 VITALS — BP 148/75 | HR 105 | Temp 98.4°F | Resp 19

## 2016-04-20 DIAGNOSIS — C787 Secondary malignant neoplasm of liver and intrahepatic bile duct: Secondary | ICD-10-CM | POA: Diagnosis not present

## 2016-04-20 DIAGNOSIS — C7800 Secondary malignant neoplasm of unspecified lung: Secondary | ICD-10-CM

## 2016-04-20 DIAGNOSIS — R509 Fever, unspecified: Secondary | ICD-10-CM

## 2016-04-20 DIAGNOSIS — C182 Malignant neoplasm of ascending colon: Secondary | ICD-10-CM

## 2016-04-20 MED ORDER — SODIUM CHLORIDE 0.9% FLUSH
10.0000 mL | Freq: Once | INTRAVENOUS | Status: AC
  Administered 2016-04-20: 10 mL via INTRAVENOUS
  Filled 2016-04-20: qty 10

## 2016-04-20 MED ORDER — HYDROCODONE-ACETAMINOPHEN 5-325 MG PO TABS
1.0000 | ORAL_TABLET | Freq: Once | ORAL | Status: AC
  Administered 2016-04-20: 1 via ORAL

## 2016-04-20 MED ORDER — HYDROCODONE-ACETAMINOPHEN 5-325 MG PO TABS
ORAL_TABLET | ORAL | Status: AC
  Filled 2016-04-20: qty 1

## 2016-04-20 MED ORDER — SODIUM CHLORIDE 0.9 % IV SOLN
INTRAVENOUS | Status: DC
  Administered 2016-04-20: 15:00:00 via INTRAVENOUS

## 2016-04-20 MED ORDER — HEPARIN SOD (PORK) LOCK FLUSH 100 UNIT/ML IV SOLN
500.0000 [IU] | Freq: Once | INTRAVENOUS | Status: AC
  Administered 2016-04-20: 500 [IU] via INTRAVENOUS
  Filled 2016-04-20: qty 5

## 2016-04-20 NOTE — Patient Instructions (Signed)

## 2016-04-25 ENCOUNTER — Other Ambulatory Visit: Payer: Medicare Other

## 2016-04-25 ENCOUNTER — Telehealth: Payer: Self-pay

## 2016-04-25 ENCOUNTER — Ambulatory Visit: Payer: Medicare Other | Admitting: Nurse Practitioner

## 2016-04-25 NOTE — Telephone Encounter (Signed)
Called pt to follow up on missed appt today, pt states she didn't know she had an appt today just knew of the appt on 11/14. Informed pt this nurse would inform Dr. Benay Spice to see about another appt this week if needed. Pt verbalized understanding.   Per MD pt can wait to be seen on 05/02/16 if feeling okay. Called patient states she feels better today than she has and will call if there are any changes before the 14th.

## 2016-04-26 ENCOUNTER — Other Ambulatory Visit: Payer: Self-pay | Admitting: Oncology

## 2016-04-30 ENCOUNTER — Other Ambulatory Visit: Payer: Self-pay | Admitting: Oncology

## 2016-05-02 ENCOUNTER — Ambulatory Visit (HOSPITAL_BASED_OUTPATIENT_CLINIC_OR_DEPARTMENT_OTHER): Payer: Medicare Other | Admitting: Nurse Practitioner

## 2016-05-02 ENCOUNTER — Encounter: Payer: Medicare Other | Admitting: Nutrition

## 2016-05-02 ENCOUNTER — Other Ambulatory Visit (HOSPITAL_BASED_OUTPATIENT_CLINIC_OR_DEPARTMENT_OTHER): Payer: Medicare Other

## 2016-05-02 ENCOUNTER — Ambulatory Visit (HOSPITAL_BASED_OUTPATIENT_CLINIC_OR_DEPARTMENT_OTHER): Payer: Medicare Other

## 2016-05-02 ENCOUNTER — Other Ambulatory Visit: Payer: Self-pay | Admitting: Oncology

## 2016-05-02 VITALS — BP 128/81 | HR 108 | Temp 98.2°F | Resp 18 | Ht 71.0 in | Wt 178.2 lb

## 2016-05-02 VITALS — BP 119/71 | HR 74

## 2016-05-02 DIAGNOSIS — Z452 Encounter for adjustment and management of vascular access device: Secondary | ICD-10-CM

## 2016-05-02 DIAGNOSIS — G620 Drug-induced polyneuropathy: Secondary | ICD-10-CM | POA: Diagnosis not present

## 2016-05-02 DIAGNOSIS — Z5112 Encounter for antineoplastic immunotherapy: Secondary | ICD-10-CM | POA: Diagnosis present

## 2016-05-02 DIAGNOSIS — I10 Essential (primary) hypertension: Secondary | ICD-10-CM

## 2016-05-02 DIAGNOSIS — E119 Type 2 diabetes mellitus without complications: Secondary | ICD-10-CM | POA: Diagnosis not present

## 2016-05-02 DIAGNOSIS — C787 Secondary malignant neoplasm of liver and intrahepatic bile duct: Secondary | ICD-10-CM

## 2016-05-02 DIAGNOSIS — Z5111 Encounter for antineoplastic chemotherapy: Secondary | ICD-10-CM | POA: Diagnosis not present

## 2016-05-02 DIAGNOSIS — C7800 Secondary malignant neoplasm of unspecified lung: Secondary | ICD-10-CM

## 2016-05-02 DIAGNOSIS — C182 Malignant neoplasm of ascending colon: Secondary | ICD-10-CM

## 2016-05-02 DIAGNOSIS — D509 Iron deficiency anemia, unspecified: Secondary | ICD-10-CM | POA: Diagnosis not present

## 2016-05-02 DIAGNOSIS — Z95828 Presence of other vascular implants and grafts: Secondary | ICD-10-CM

## 2016-05-02 LAB — CBC WITH DIFFERENTIAL/PLATELET
BASO%: 0.9 % (ref 0.0–2.0)
Basophils Absolute: 0.1 10*3/uL (ref 0.0–0.1)
EOS ABS: 0.1 10*3/uL (ref 0.0–0.5)
EOS%: 1.2 % (ref 0.0–7.0)
HCT: 30.2 % — ABNORMAL LOW (ref 34.8–46.6)
HEMOGLOBIN: 9.4 g/dL — AB (ref 11.6–15.9)
LYMPH%: 28.3 % (ref 14.0–49.7)
MCH: 25.6 pg (ref 25.1–34.0)
MCHC: 31.3 g/dL — ABNORMAL LOW (ref 31.5–36.0)
MCV: 81.9 fL (ref 79.5–101.0)
MONO#: 0.8 10*3/uL (ref 0.1–0.9)
MONO%: 14.1 % — AB (ref 0.0–14.0)
NEUT%: 55.5 % (ref 38.4–76.8)
NEUTROS ABS: 3.2 10*3/uL (ref 1.5–6.5)
Platelets: 429 10*3/uL — ABNORMAL HIGH (ref 145–400)
RBC: 3.69 10*6/uL — AB (ref 3.70–5.45)
RDW: 20.8 % — AB (ref 11.2–14.5)
WBC: 5.8 10*3/uL (ref 3.9–10.3)
lymph#: 1.6 10*3/uL (ref 0.9–3.3)

## 2016-05-02 LAB — COMPREHENSIVE METABOLIC PANEL
ALBUMIN: 2.2 g/dL — AB (ref 3.5–5.0)
ALK PHOS: 207 U/L — AB (ref 40–150)
ALT: 10 U/L (ref 0–55)
ANION GAP: 10 meq/L (ref 3–11)
AST: 114 U/L — ABNORMAL HIGH (ref 5–34)
BUN: 6.8 mg/dL — ABNORMAL LOW (ref 7.0–26.0)
CALCIUM: 9.8 mg/dL (ref 8.4–10.4)
CHLORIDE: 109 meq/L (ref 98–109)
CO2: 22 mEq/L (ref 22–29)
CREATININE: 0.7 mg/dL (ref 0.6–1.1)
EGFR: >90 mL/min/{1.73_m2} (ref >90)
Glucose: 96 mg/dl (ref 70–140)
POTASSIUM: 3.3 meq/L — AB (ref 3.5–5.1)
Sodium: 142 mEq/L (ref 136–145)
Total Bilirubin: 0.42 mg/dL (ref 0.20–1.20)
Total Protein: 7.4 g/dL (ref 6.4–8.3)

## 2016-05-02 MED ORDER — ATROPINE SULFATE 1 MG/ML IJ SOLN
0.5000 mg | Freq: Once | INTRAMUSCULAR | Status: AC | PRN
  Administered 2016-05-02: 0.5 mg via INTRAVENOUS

## 2016-05-02 MED ORDER — PALONOSETRON HCL INJECTION 0.25 MG/5ML
INTRAVENOUS | Status: AC
  Filled 2016-05-02: qty 5

## 2016-05-02 MED ORDER — SODIUM CHLORIDE 0.9 % IJ SOLN
10.0000 mL | INTRAMUSCULAR | Status: DC | PRN
  Administered 2016-05-02: 10 mL via INTRAVENOUS
  Filled 2016-05-02: qty 10

## 2016-05-02 MED ORDER — ATROPINE SULFATE 1 MG/ML IJ SOLN
INTRAMUSCULAR | Status: AC
  Filled 2016-05-02: qty 1

## 2016-05-02 MED ORDER — LEUCOVORIN CALCIUM INJECTION 350 MG
400.0000 mg/m2 | Freq: Once | INTRAMUSCULAR | Status: AC
  Administered 2016-05-02: 828 mg via INTRAVENOUS
  Filled 2016-05-02: qty 41.4

## 2016-05-02 MED ORDER — DEXAMETHASONE SODIUM PHOSPHATE 10 MG/ML IJ SOLN
10.0000 mg | Freq: Once | INTRAMUSCULAR | Status: AC
  Administered 2016-05-02: 10 mg via INTRAVENOUS

## 2016-05-02 MED ORDER — PALONOSETRON HCL INJECTION 0.25 MG/5ML
0.2500 mg | Freq: Once | INTRAVENOUS | Status: AC
  Administered 2016-05-02: 0.25 mg via INTRAVENOUS

## 2016-05-02 MED ORDER — SODIUM CHLORIDE 0.9 % IV SOLN
Freq: Once | INTRAVENOUS | Status: AC
  Administered 2016-05-02: 12:00:00 via INTRAVENOUS

## 2016-05-02 MED ORDER — DEXAMETHASONE SODIUM PHOSPHATE 10 MG/ML IJ SOLN
INTRAMUSCULAR | Status: AC
  Filled 2016-05-02: qty 1

## 2016-05-02 MED ORDER — POTASSIUM CHLORIDE CRYS ER 20 MEQ PO TBCR: 20.0000 meq | EXTENDED_RELEASE_TABLET | Freq: Every day | ORAL | 0 refills | Status: DC

## 2016-05-02 MED ORDER — SODIUM CHLORIDE 0.9 % IV SOLN
2425.0000 mg/m2 | INTRAVENOUS | Status: DC
  Administered 2016-05-02: 5000 mg via INTRAVENOUS
  Filled 2016-05-02: qty 100

## 2016-05-02 MED ORDER — SODIUM CHLORIDE 0.9 % IV SOLN
4.8000 mg/kg | Freq: Once | INTRAVENOUS | Status: AC
  Administered 2016-05-02: 400 mg via INTRAVENOUS
  Filled 2016-05-02: qty 16

## 2016-05-02 MED ORDER — IRINOTECAN HCL CHEMO INJECTION 100 MG/5ML
125.0000 mg/m2 | Freq: Once | INTRAVENOUS | Status: AC
  Administered 2016-05-02: 260 mg via INTRAVENOUS
  Filled 2016-05-02: qty 5

## 2016-05-02 NOTE — Progress Notes (Signed)
Nutrition Follow-up:  Patient with recurrent colon cancer.  Seen for nutrition follow-up during infusion.    Patient reports no taste.  Denies vomiting some slight nausea. Had little bout of constipation last week but that has resolved.  States that she is drinking ensure original 2 times per day.  Typically eats cheerios for breakfast, drinks ensure for lunch and then eats meat and vegetables for dinner.    Weight increased to 178 pounds 3.2 oz today from 174.8 pounds on September 12th.    Medications: zofran, compazine.   Labs: reviewed   NUTRITION DIAGNOSIS: Malnutrition improving with weight increase   INTERVENTION:   Discussed and provided handout on taste changes.  Encouraged patient to try ensure plus or ensure enlive for higher calorie and protein than ensure original. Coupons provided.   Discussed importance of taking nausea medication as needed to help control nausea to allow patient to be able to eat and receive adequate nutrition.     MONITORING, EVALUATION, GOAL: Patient will increase oral intake to promote weight maintenance.   NEXT VISIT: As needed  Loura Pitt B. Zenia Resides, [place], [place] (pager)

## 2016-05-02 NOTE — Progress Notes (Signed)
Okay to treat today per Dr. Benay Spice with AST- 114.

## 2016-05-02 NOTE — Patient Instructions (Signed)
[facility] Discharge Instructions for Patients Receiving Chemotherapy  Today you received the following chemotherapy agents Avastin/Irinotecan/Leucovorin/Fluorouracil.  To help prevent nausea and vomiting after your treatment, we encourage you to take your nausea medication as directed.   If you develop nausea and vomiting that is not controlled by your nausea medication, call the clinic.   BELOW ARE SYMPTOMS THAT SHOULD BE REPORTED IMMEDIATELY:  *FEVER GREATER THAN 100.5 F  *CHILLS WITH OR WITHOUT FEVER  NAUSEA AND VOMITING THAT IS NOT CONTROLLED WITH YOUR NAUSEA MEDICATION  *UNUSUAL SHORTNESS OF BREATH  *UNUSUAL BRUISING OR BLEEDING  TENDERNESS IN MOUTH AND THROAT WITH OR WITHOUT PRESENCE OF ULCERS  *URINARY PROBLEMS  *BOWEL PROBLEMS  UNUSUAL RASH Items with * indicate a potential emergency and should be followed up as soon as possible.  Feel free to call the clinic you have any questions or concerns. The clinic phone number is [phone].  Please show the CHEMO ALERT CARD at check-in to the Emergency Department and triage nurse.    

## 2016-05-02 NOTE — Progress Notes (Signed)
Faxed rx for stairlift for at home use to [facility]- [phone] per pt request

## 2016-05-02 NOTE — Progress Notes (Signed)
  [place] OFFICE PROGRESS NOTE   Diagnosis:  Colon cancer  INTERVAL HISTORY:   Judith Stephens returns as scheduled. She completed cycle 1 FOLFIRI/Avastin 04/18/2016. She denies nausea/vomiting. No mouth sores. No diarrhea. She did have some constipation. No bleeding. Stable dyspnea on exertion. No cough. No fever. She had an episode of right-sided abdominal pain over the weekend.  Objective:  Vital signs in last 24 hours:  Blood pressure 128/81, pulse (!) 108, temperature 98.2 F (36.8 C), temperature source Oral, resp. rate 18, height 5\' 11"  (1.803 m), weight 178 lb 3.2 oz (80.8 kg).    HEENT: No thrush or ulcers. Resp: Lungs with faint rales at both lung bases. No respiratory distress. Cardio: Regular, tachycardic. GI: Liver is palpable right mid abdomen and crossing midline. Vascular: No leg edema. Port-A-Cath without erythema.  Lab Results:  Lab Results  Component Value Date   WBC 5.8 05/02/2016   HGB 9.4 (L) 05/02/2016   HCT 30.2 (L) 05/02/2016   MCV 81.9 05/02/2016   PLT 429 (H) 05/02/2016   NEUTROABS 3.2 05/02/2016    Imaging:  No results found.  Medications: I have reviewed the patient's current medications.  Assessment/Plan: 1. 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 She began adjuvant Xeloda on 05/14/2012. Final cycle of Xeloda started on 10/22/2012.  CT abdomen/pelvis 01/10/2016-innumerable hepatic metastases  CT chest 01/12/2016-multiple pulmonary nodules consistent with metastases  Cycle 1 FOLFOX 02/01/2016  Cycle 2 FOLFOX 02/15/2016  Cycle 3 FOLFOX 02/29/2016  Cycle 4 FOLFOX 03/14/2016  Cycle 5 FOLFOX 03/28/2016  CTs 04/13/2016-progressive lung and liver metastases  Cycle 1 FOLFIRI/Avastin 04/18/2016  Cycle 2 FOLFIRI/Avastin 05/02/2016 2. Severe microcytic anemia likely secondary to iron deficiency related to bleeding from the colon tumor.  The hemoglobin and MCV improved on oral iron. Oral iron was discontinued following office visit 12/16/2012.  Recurrent microcytic anemia July 2017  Stool positive for occult blood 02/01/2016  Referred to GI 3. Diabetes. 4. Hypertension. 5. Hyperlipidemia. 6. History of hand/foot syndrome secondary to Xeloda prompting the Xeloda to be discontinued after day 12, cycle 2. Improved. Xeloda was dose reduced beginning with cycle 3. 7. History of mild diarrhea secondary to Xeloda.  8. Hypercalcemia-Zometa initiated 04/18/2016 9. History of eye tearing-most likely related to seasonal allergies though this could be a manifestation of Xeloda toxicity.  10. Status post root canal 10/02/2012. 11. Colonoscopy 03/13/2013 by Dr. Amedeo Plenty. Pathology on transverse colon polyp showed a tubular adenoma. Next colonoscopy recommendedat a three-year interval. 12. Anorexia/weight loss 13. Port-A-Cath placement by Dr. Dalbert Batman 01/31/2016 14. Early hand-foot syndrome secondary to 5-fluorouracil 15. 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 Plan to proceed with cycle 2 today as scheduled. She will return for a follow-up visit and cycle 3 in 2 weeks. She will contact the office in the interim with any problems.  Patient seen with Dr. Benay Spice.    Ned Card ANP/GNP-BC   05/02/2016  11:16 AM  This was a shared visit with Ned Card. Judith Stephens tolerated the first cycle of FOLFIRI/Avastin well. Her performance status appears improved. The plan is to proceed with cycle 2 today. She will return for an office visit and chemotherapy in 2 weeks.  Julieanne Manson, M.D.

## 2016-05-04 ENCOUNTER — Ambulatory Visit (HOSPITAL_BASED_OUTPATIENT_CLINIC_OR_DEPARTMENT_OTHER): Payer: Medicare Other

## 2016-05-04 VITALS — BP 125/73 | HR 103 | Temp 99.5°F | Resp 16

## 2016-05-04 DIAGNOSIS — C182 Malignant neoplasm of ascending colon: Secondary | ICD-10-CM | POA: Diagnosis not present

## 2016-05-04 DIAGNOSIS — Z452 Encounter for adjustment and management of vascular access device: Secondary | ICD-10-CM | POA: Diagnosis present

## 2016-05-04 MED ORDER — HEPARIN SOD (PORK) LOCK FLUSH 100 UNIT/ML IV SOLN
500.0000 [IU] | Freq: Once | INTRAVENOUS | Status: AC | PRN
  Administered 2016-05-04: 500 [IU]
  Filled 2016-05-04: qty 5

## 2016-05-04 MED ORDER — SODIUM CHLORIDE 0.9% FLUSH
10.0000 mL | INTRAVENOUS | Status: DC | PRN
  Administered 2016-05-04: 10 mL
  Filled 2016-05-04: qty 10

## 2016-05-16 ENCOUNTER — Other Ambulatory Visit: Payer: Self-pay | Admitting: *Deleted

## 2016-05-16 ENCOUNTER — Telehealth: Payer: Self-pay | Admitting: Oncology

## 2016-05-16 ENCOUNTER — Ambulatory Visit (HOSPITAL_BASED_OUTPATIENT_CLINIC_OR_DEPARTMENT_OTHER): Payer: Medicare Other | Admitting: Oncology

## 2016-05-16 ENCOUNTER — Ambulatory Visit (HOSPITAL_BASED_OUTPATIENT_CLINIC_OR_DEPARTMENT_OTHER): Payer: Medicare Other

## 2016-05-16 VITALS — BP 140/81 | HR 95 | Temp 98.2°F | Resp 20 | Wt 175.1 lb

## 2016-05-16 DIAGNOSIS — C7800 Secondary malignant neoplasm of unspecified lung: Secondary | ICD-10-CM

## 2016-05-16 DIAGNOSIS — C787 Secondary malignant neoplasm of liver and intrahepatic bile duct: Secondary | ICD-10-CM | POA: Diagnosis not present

## 2016-05-16 DIAGNOSIS — D509 Iron deficiency anemia, unspecified: Secondary | ICD-10-CM

## 2016-05-16 DIAGNOSIS — C182 Malignant neoplasm of ascending colon: Secondary | ICD-10-CM

## 2016-05-16 DIAGNOSIS — G620 Drug-induced polyneuropathy: Secondary | ICD-10-CM

## 2016-05-16 DIAGNOSIS — Z5111 Encounter for antineoplastic chemotherapy: Secondary | ICD-10-CM | POA: Diagnosis not present

## 2016-05-16 DIAGNOSIS — Z5112 Encounter for antineoplastic immunotherapy: Secondary | ICD-10-CM | POA: Diagnosis present

## 2016-05-16 LAB — COMPREHENSIVE METABOLIC PANEL
ALBUMIN: 2.6 g/dL — AB (ref 3.5–5.0)
ALK PHOS: 174 U/L — AB (ref 40–150)
ALT: 11 U/L (ref 0–55)
ANION GAP: 10 meq/L (ref 3–11)
AST: 75 U/L — ABNORMAL HIGH (ref 5–34)
BILIRUBIN TOTAL: 0.29 mg/dL (ref 0.20–1.20)
BUN: 10.5 mg/dL (ref 7.0–26.0)
CALCIUM: 10.4 mg/dL (ref 8.4–10.4)
CO2: 22 meq/L (ref 22–29)
CREATININE: 0.7 mg/dL (ref 0.6–1.1)
Chloride: 110 mEq/L — ABNORMAL HIGH (ref 98–109)
Glucose: 137 mg/dl (ref 70–140)
Potassium: 4.1 mEq/L (ref 3.5–5.1)
Sodium: 142 mEq/L (ref 136–145)
TOTAL PROTEIN: 7.6 g/dL (ref 6.4–8.3)

## 2016-05-16 LAB — CBC WITH DIFFERENTIAL/PLATELET
BASO%: 0.9 % (ref 0.0–2.0)
BASOS ABS: 0.1 10*3/uL (ref 0.0–0.1)
EOS ABS: 0.1 10*3/uL (ref 0.0–0.5)
EOS%: 0.8 % (ref 0.0–7.0)
HEMATOCRIT: 30.4 % — AB (ref 34.8–46.6)
HGB: 9.5 g/dL — ABNORMAL LOW (ref 11.6–15.9)
LYMPH%: 34.6 % (ref 14.0–49.7)
MCH: 26.6 pg (ref 25.1–34.0)
MCHC: 31.3 g/dL — ABNORMAL LOW (ref 31.5–36.0)
MCV: 85.2 fL (ref 79.5–101.0)
MONO#: 0.8 10*3/uL (ref 0.1–0.9)
MONO%: 11 % (ref 0.0–14.0)
NEUT#: 4 10*3/uL (ref 1.5–6.5)
NEUT%: 52.7 % (ref 38.4–76.8)
NRBC: 0 % (ref 0–0)
PLATELETS: 404 10*3/uL — AB (ref 145–400)
RBC: 3.57 10*6/uL — AB (ref 3.70–5.45)
RDW: 21.4 % — AB (ref 11.2–14.5)
WBC: 7.5 10*3/uL (ref 3.9–10.3)
lymph#: 2.6 10*3/uL (ref 0.9–3.3)

## 2016-05-16 LAB — UA PROTEIN, DIPSTICK - CHCC

## 2016-05-16 MED ORDER — LORAZEPAM 0.5 MG PO TABS: 0.5000 mg | ORAL_TABLET | Freq: Every evening | ORAL | 0 refills | Status: DC | PRN

## 2016-05-16 MED ORDER — ATROPINE SULFATE 1 MG/ML IJ SOLN
0.5000 mg | Freq: Once | INTRAMUSCULAR | Status: AC | PRN
  Administered 2016-05-16: 0.5 mg via INTRAVENOUS

## 2016-05-16 MED ORDER — PALONOSETRON HCL INJECTION 0.25 MG/5ML
0.2500 mg | Freq: Once | INTRAVENOUS | Status: AC
  Administered 2016-05-16: 0.25 mg via INTRAVENOUS

## 2016-05-16 MED ORDER — SODIUM CHLORIDE 0.9 % IV SOLN
2410.0000 mg/m2 | INTRAVENOUS | Status: DC
  Administered 2016-05-16: 5000 mg via INTRAVENOUS
  Filled 2016-05-16: qty 100

## 2016-05-16 MED ORDER — ATROPINE SULFATE 1 MG/ML IJ SOLN
INTRAMUSCULAR | Status: AC
Start: 2016-05-16 — End: 2016-05-16
  Filled 2016-05-16: qty 1

## 2016-05-16 MED ORDER — DEXTROSE 5 % IV SOLN
125.0000 mg/m2 | Freq: Once | INTRAVENOUS | Status: AC
  Administered 2016-05-16: 260 mg via INTRAVENOUS
  Filled 2016-05-16: qty 8

## 2016-05-16 MED ORDER — DEXAMETHASONE SODIUM PHOSPHATE 10 MG/ML IJ SOLN
INTRAMUSCULAR | Status: AC
  Filled 2016-05-16: qty 1

## 2016-05-16 MED ORDER — PALONOSETRON HCL INJECTION 0.25 MG/5ML
INTRAVENOUS | Status: AC
  Filled 2016-05-16: qty 5

## 2016-05-16 MED ORDER — HEPARIN SOD (PORK) LOCK FLUSH 100 UNIT/ML IV SOLN
500.0000 [IU] | Freq: Once | INTRAVENOUS | Status: DC | PRN
  Filled 2016-05-16: qty 5

## 2016-05-16 MED ORDER — SODIUM CHLORIDE 0.9% FLUSH
10.0000 mL | INTRAVENOUS | Status: DC | PRN
  Filled 2016-05-16: qty 10

## 2016-05-16 MED ORDER — LEUCOVORIN CALCIUM INJECTION 350 MG
400.0000 mg/m2 | Freq: Once | INTRAVENOUS | Status: AC
  Administered 2016-05-16: 828 mg via INTRAVENOUS
  Filled 2016-05-16: qty 41.4

## 2016-05-16 MED ORDER — SODIUM CHLORIDE 0.9 % IV SOLN
Freq: Once | INTRAVENOUS | Status: AC
  Administered 2016-05-16: 12:00:00 via INTRAVENOUS

## 2016-05-16 MED ORDER — DEXAMETHASONE SODIUM PHOSPHATE 10 MG/ML IJ SOLN
10.0000 mg | Freq: Once | INTRAMUSCULAR | Status: AC
  Administered 2016-05-16: 10 mg via INTRAVENOUS

## 2016-05-16 MED ORDER — SODIUM CHLORIDE 0.9 % IV SOLN
4.8000 mg/kg | Freq: Once | INTRAVENOUS | Status: AC
  Administered 2016-05-16: 400 mg via INTRAVENOUS
  Filled 2016-05-16: qty 16

## 2016-05-16 NOTE — Progress Notes (Signed)
  [place] OFFICE PROGRESS NOTE   Diagnosis: Colon cancer  INTERVAL HISTORY:   Judith Stephens returns as scheduled. She completed another cycle of FOLFIRI/Avastin on 05/02/2016. She reports mild nausea for a few days following chemotherapy. This improved with home anti-emetics. No diarrhea. Good appetite. No pain. Her energy level has improved. No further fever.  Objective:  Vital signs in last 24 hours:  Blood pressure 140/81, pulse 95, temperature 98.2 F (36.8 C), temperature source Oral, resp. rate 20, SpO2 100 %.    HEENT: No thrush or ulcers Resp: Lungs clear bilaterally Cardio: Regular rate and rhythm GI: The liver edge is palpable in the mid upper abdomen. No apparent ascites.  Vascular: No leg edema  Skin: Hyperpigmentation of the hands   Portacath/PICC-without erythema  Lab Results:  Lab Results  Component Value Date   WBC 5.8 05/02/2016   HGB 9.4 (L) 05/02/2016   HCT 30.2 (L) 05/02/2016   MCV 81.9 05/02/2016   PLT 429 (H) 05/02/2016   NEUTROABS 3.2 05/02/2016     Medications: I have reviewed the patient's current medications.  Assessment/Plan: 1. 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 She began adjuvant Xeloda on 05/14/2012. Final cycle of Xeloda started on 10/22/2012.  CT abdomen/pelvis 01/10/2016-innumerable hepatic metastases  CT chest 01/12/2016-multiple pulmonary nodules consistent with metastases  Cycle 1 FOLFOX 02/01/2016  Cycle 2 FOLFOX 02/15/2016  Cycle 3 FOLFOX 02/29/2016  Cycle 4 FOLFOX 03/14/2016  Cycle 5 FOLFOX 03/28/2016  CTs 04/13/2016-progressive lung and liver metastases  Cycle 1 FOLFIRI/Avastin 04/18/2016  Cycle 2 FOLFIRI/Avastin 05/02/2016  Cycle 3 FOLFIRI/Avastin 05/16/2016 2. Severe microcytic anemia likely secondary to iron deficiency related to bleeding from the colon tumor. The hemoglobin and MCV improved on oral iron.  Oral iron was discontinued following office visit 12/16/2012.  Recurrent microcytic anemia July 2017  Stool positive for occult blood 02/01/2016  Referred to GI 3. Diabetes. 4. Hypertension. 5. Hyperlipidemia. 6. History of hand/foot syndrome secondary to Xeloda prompting the Xeloda to be discontinued after day 12, cycle 2. Improved. Xeloda was dose reduced beginning with cycle 3. 7. History of mild diarrhea secondary to Xeloda.  8. Hypercalcemia-Zometa initiated 04/18/2016 9. History of eye tearing-most likely related to seasonal allergies though this could be a manifestation of Xeloda toxicity.  10. Status post root canal 10/02/2012. 11. Colonoscopy 03/13/2013 by Dr. Amedeo Plenty. Pathology on transverse colon polyp showed a tubular adenoma. Next colonoscopy recommendedat a three-year interval. 12. Anorexia/weight loss 13. Port-A-Cath placement by Dr. Dalbert Batman 01/31/2016 14. Early hand-foot syndrome secondary to 5-fluorouracil 15. 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 Her performance status has improved since beginning FOLFIRI/Avastin. She will complete cycle 3 today. She will return for an office visit and chemotherapy in 2 weeks. We will follow-up on the CEA today.  Betsy Coder, MD  05/16/2016  10:45 AM

## 2016-05-16 NOTE — Patient Instructions (Signed)
[facility] Discharge Instructions for Patients Receiving Chemotherapy  Today you received the following chemotherapy agents Avastin/Irinotecan/Leucovorin/Fluorouracil.  To help prevent nausea and vomiting after your treatment, we encourage you to take your nausea medication as directed.   If you develop nausea and vomiting that is not controlled by your nausea medication, call the clinic.   BELOW ARE SYMPTOMS THAT SHOULD BE REPORTED IMMEDIATELY:  *FEVER GREATER THAN 100.5 F  *CHILLS WITH OR WITHOUT FEVER  NAUSEA AND VOMITING THAT IS NOT CONTROLLED WITH YOUR NAUSEA MEDICATION  *UNUSUAL SHORTNESS OF BREATH  *UNUSUAL BRUISING OR BLEEDING  TENDERNESS IN MOUTH AND THROAT WITH OR WITHOUT PRESENCE OF ULCERS  *URINARY PROBLEMS  *BOWEL PROBLEMS  UNUSUAL RASH Items with * indicate a potential emergency and should be followed up as soon as possible.  Feel free to call the clinic you have any questions or concerns. The clinic phone number is [phone].  Please show the CHEMO ALERT CARD at check-in to the Emergency Department and triage nurse.    

## 2016-05-16 NOTE — Telephone Encounter (Signed)
Message sent to chemo scheduler to be added per 05/16/16 los. Appointments scheduled per 05/16/16 los. A copy of the AVS  Report and appointment schedule was given to patient, per 05/16/16 los.

## 2016-05-17 ENCOUNTER — Telehealth: Payer: Self-pay | Admitting: *Deleted

## 2016-05-17 NOTE — Telephone Encounter (Signed)
Per LOS I have scheduled appts and notified the scheduler 

## 2016-05-18 ENCOUNTER — Ambulatory Visit (HOSPITAL_BASED_OUTPATIENT_CLINIC_OR_DEPARTMENT_OTHER): Payer: Medicare Other

## 2016-05-18 VITALS — BP 114/60 | HR 93 | Temp 98.8°F | Resp 18

## 2016-05-18 DIAGNOSIS — Z452 Encounter for adjustment and management of vascular access device: Secondary | ICD-10-CM | POA: Diagnosis present

## 2016-05-18 DIAGNOSIS — C182 Malignant neoplasm of ascending colon: Secondary | ICD-10-CM

## 2016-05-18 DIAGNOSIS — C787 Secondary malignant neoplasm of liver and intrahepatic bile duct: Secondary | ICD-10-CM

## 2016-05-18 DIAGNOSIS — Z95828 Presence of other vascular implants and grafts: Secondary | ICD-10-CM

## 2016-05-18 MED ORDER — HEPARIN SOD (PORK) LOCK FLUSH 100 UNIT/ML IV SOLN
500.0000 [IU] | Freq: Once | INTRAVENOUS | Status: AC | PRN
  Administered 2016-05-18: 500 [IU]
  Filled 2016-05-18: qty 5

## 2016-05-18 MED ORDER — SODIUM CHLORIDE 0.9% FLUSH
10.0000 mL | INTRAVENOUS | Status: DC | PRN
  Administered 2016-05-18: 10 mL
  Filled 2016-05-18: qty 10

## 2016-05-28 ENCOUNTER — Other Ambulatory Visit: Payer: Self-pay | Admitting: Oncology

## 2016-05-30 ENCOUNTER — Ambulatory Visit (HOSPITAL_BASED_OUTPATIENT_CLINIC_OR_DEPARTMENT_OTHER): Payer: Medicare Other | Admitting: Nurse Practitioner

## 2016-05-30 ENCOUNTER — Telehealth: Payer: Self-pay | Admitting: Nurse Practitioner

## 2016-05-30 ENCOUNTER — Other Ambulatory Visit (HOSPITAL_BASED_OUTPATIENT_CLINIC_OR_DEPARTMENT_OTHER): Payer: Medicare Other

## 2016-05-30 ENCOUNTER — Ambulatory Visit (HOSPITAL_BASED_OUTPATIENT_CLINIC_OR_DEPARTMENT_OTHER): Payer: Medicare Other

## 2016-05-30 VITALS — BP 126/76 | HR 103 | Temp 97.9°F | Resp 17 | Ht 71.0 in | Wt 172.3 lb

## 2016-05-30 VITALS — BP 111/71 | HR 88

## 2016-05-30 DIAGNOSIS — Z5112 Encounter for antineoplastic immunotherapy: Secondary | ICD-10-CM | POA: Diagnosis not present

## 2016-05-30 DIAGNOSIS — Z5111 Encounter for antineoplastic chemotherapy: Secondary | ICD-10-CM | POA: Diagnosis not present

## 2016-05-30 DIAGNOSIS — C182 Malignant neoplasm of ascending colon: Secondary | ICD-10-CM | POA: Diagnosis not present

## 2016-05-30 DIAGNOSIS — C7800 Secondary malignant neoplasm of unspecified lung: Secondary | ICD-10-CM

## 2016-05-30 DIAGNOSIS — G620 Drug-induced polyneuropathy: Secondary | ICD-10-CM | POA: Diagnosis not present

## 2016-05-30 DIAGNOSIS — I10 Essential (primary) hypertension: Secondary | ICD-10-CM | POA: Diagnosis not present

## 2016-05-30 DIAGNOSIS — C787 Secondary malignant neoplasm of liver and intrahepatic bile duct: Secondary | ICD-10-CM

## 2016-05-30 DIAGNOSIS — Z452 Encounter for adjustment and management of vascular access device: Secondary | ICD-10-CM

## 2016-05-30 DIAGNOSIS — E119 Type 2 diabetes mellitus without complications: Secondary | ICD-10-CM | POA: Diagnosis not present

## 2016-05-30 DIAGNOSIS — Z95828 Presence of other vascular implants and grafts: Secondary | ICD-10-CM

## 2016-05-30 DIAGNOSIS — C189 Malignant neoplasm of colon, unspecified: Secondary | ICD-10-CM

## 2016-05-30 LAB — CBC WITH DIFFERENTIAL/PLATELET
BASO%: 1.2 % (ref 0.0–2.0)
BASOS ABS: 0.1 10*3/uL (ref 0.0–0.1)
EOS ABS: 0.1 10*3/uL (ref 0.0–0.5)
EOS%: 1.5 % (ref 0.0–7.0)
HEMATOCRIT: 31.9 % — AB (ref 34.8–46.6)
HGB: 9.9 g/dL — ABNORMAL LOW (ref 11.6–15.9)
LYMPH#: 2.8 10*3/uL (ref 0.9–3.3)
LYMPH%: 32.8 % (ref 14.0–49.7)
MCH: 26.8 pg (ref 25.1–34.0)
MCHC: 31.1 g/dL — ABNORMAL LOW (ref 31.5–36.0)
MCV: 86.3 fL (ref 79.5–101.0)
MONO#: 0.9 10*3/uL (ref 0.1–0.9)
MONO%: 10.3 % (ref 0.0–14.0)
NEUT%: 54.2 % (ref 38.4–76.8)
NEUTROS ABS: 4.6 10*3/uL (ref 1.5–6.5)
Platelets: 432 10*3/uL — ABNORMAL HIGH (ref 145–400)
RBC: 3.7 10*6/uL (ref 3.70–5.45)
RDW: 21.6 % — AB (ref 11.2–14.5)
WBC: 8.4 10*3/uL (ref 3.9–10.3)

## 2016-05-30 LAB — COMPREHENSIVE METABOLIC PANEL
ALBUMIN: 2.8 g/dL — AB (ref 3.5–5.0)
ALK PHOS: 155 U/L — AB (ref 40–150)
ALT: 11 U/L (ref 0–55)
AST: 65 U/L — ABNORMAL HIGH (ref 5–34)
Anion Gap: 10 mEq/L (ref 3–11)
BILIRUBIN TOTAL: 0.39 mg/dL (ref 0.20–1.20)
BUN: 8.7 mg/dL (ref 7.0–26.0)
CALCIUM: 10.7 mg/dL — AB (ref 8.4–10.4)
CO2: 22 mEq/L (ref 22–29)
Chloride: 110 mEq/L — ABNORMAL HIGH (ref 98–109)
Creatinine: 0.7 mg/dL (ref 0.6–1.1)
GLUCOSE: 59 mg/dL — AB (ref 70–140)
POTASSIUM: 4 meq/L (ref 3.5–5.1)
SODIUM: 142 meq/L (ref 136–145)
TOTAL PROTEIN: 7.8 g/dL (ref 6.4–8.3)

## 2016-05-30 LAB — UA PROTEIN, DIPSTICK - CHCC

## 2016-05-30 LAB — CEA (IN HOUSE-CHCC): CEA (CHCC-In House): 134.95 ng/mL — ABNORMAL HIGH (ref 0.00–5.00)

## 2016-05-30 MED ORDER — SODIUM CHLORIDE 0.9 % IJ SOLN
10.0000 mL | INTRAMUSCULAR | Status: DC | PRN
  Administered 2016-05-30: 10 mL via INTRAVENOUS
  Filled 2016-05-30: qty 10

## 2016-05-30 MED ORDER — ATROPINE SULFATE 1 MG/ML IJ SOLN
0.5000 mg | Freq: Once | INTRAMUSCULAR | Status: AC | PRN
  Administered 2016-05-30: 0.5 mg via INTRAVENOUS

## 2016-05-30 MED ORDER — DEXAMETHASONE SODIUM PHOSPHATE 10 MG/ML IJ SOLN
10.0000 mg | Freq: Once | INTRAMUSCULAR | Status: AC
  Administered 2016-05-30: 10 mg via INTRAVENOUS

## 2016-05-30 MED ORDER — DEXAMETHASONE SODIUM PHOSPHATE 10 MG/ML IJ SOLN
INTRAMUSCULAR | Status: AC
  Filled 2016-05-30: qty 1

## 2016-05-30 MED ORDER — ATROPINE SULFATE 1 MG/ML IJ SOLN
INTRAMUSCULAR | Status: AC
  Filled 2016-05-30: qty 1

## 2016-05-30 MED ORDER — IRINOTECAN HCL CHEMO INJECTION 100 MG/5ML
125.0000 mg/m2 | Freq: Once | INTRAVENOUS | Status: AC
  Administered 2016-05-30: 260 mg via INTRAVENOUS
  Filled 2016-05-30: qty 5

## 2016-05-30 MED ORDER — PALONOSETRON HCL INJECTION 0.25 MG/5ML
0.2500 mg | Freq: Once | INTRAVENOUS | Status: AC
  Administered 2016-05-30: 0.25 mg via INTRAVENOUS

## 2016-05-30 MED ORDER — SODIUM CHLORIDE 0.9 % IV SOLN
Freq: Once | INTRAVENOUS | Status: AC
  Administered 2016-05-30: 11:00:00 via INTRAVENOUS

## 2016-05-30 MED ORDER — SODIUM CHLORIDE 0.9 % IV SOLN
400.0000 mg | Freq: Once | INTRAVENOUS | Status: AC
  Administered 2016-05-30: 400 mg via INTRAVENOUS
  Filled 2016-05-30: qty 16

## 2016-05-30 MED ORDER — HEPARIN SOD (PORK) LOCK FLUSH 100 UNIT/ML IV SOLN
500.0000 [IU] | Freq: Once | INTRAVENOUS | Status: DC | PRN
  Filled 2016-05-30: qty 5

## 2016-05-30 MED ORDER — LEUCOVORIN CALCIUM INJECTION 350 MG
400.0000 mg/m2 | Freq: Once | INTRAVENOUS | Status: AC
  Administered 2016-05-30: 828 mg via INTRAVENOUS
  Filled 2016-05-30: qty 41.4

## 2016-05-30 MED ORDER — SODIUM CHLORIDE 0.9% FLUSH
10.0000 mL | INTRAVENOUS | Status: DC | PRN
  Filled 2016-05-30: qty 10

## 2016-05-30 MED ORDER — SODIUM CHLORIDE 0.9 % IV SOLN
2400.0000 mg/m2 | INTRAVENOUS | Status: DC
  Administered 2016-05-30: 4950 mg via INTRAVENOUS
  Filled 2016-05-30: qty 99

## 2016-05-30 MED ORDER — PALONOSETRON HCL INJECTION 0.25 MG/5ML
INTRAVENOUS | Status: AC
  Filled 2016-05-30: qty 5

## 2016-05-30 NOTE — Progress Notes (Signed)
Called in refills left on compazine and zofran, per pharmacist singular on back order. Pt informed

## 2016-05-30 NOTE — Patient Instructions (Signed)
[facility] Discharge Instructions for Patients Receiving Chemotherapy  Today you received the following chemotherapy agents: Avastin, Leucovorin, Irinotecan, and Adrucil   To help prevent nausea and vomiting after your treatment, we encourage you to take your nausea medication as directed.   If you develop nausea and vomiting that is not controlled by your nausea medication, call the clinic.   BELOW ARE SYMPTOMS THAT SHOULD BE REPORTED IMMEDIATELY:  *FEVER GREATER THAN 100.5 F  *CHILLS WITH OR WITHOUT FEVER  NAUSEA AND VOMITING THAT IS NOT CONTROLLED WITH YOUR NAUSEA MEDICATION  *UNUSUAL SHORTNESS OF BREATH  *UNUSUAL BRUISING OR BLEEDING  TENDERNESS IN MOUTH AND THROAT WITH OR WITHOUT PRESENCE OF ULCERS  *URINARY PROBLEMS  *BOWEL PROBLEMS  UNUSUAL RASH Items with * indicate a potential emergency and should be followed up as soon as possible.  Feel free to call the clinic you have any questions or concerns. The clinic phone number is [phone].  Please show the [place] at check-in to the Emergency Department and triage nurse.

## 2016-05-30 NOTE — Telephone Encounter (Signed)
Appointments scheduled per 12/12 LOS. Patient given AVS report and calendars with future scheduled appointments.  °

## 2016-05-30 NOTE — Progress Notes (Signed)
  [place] OFFICE PROGRESS NOTE   Diagnosis:  Colon cancer  INTERVAL HISTORY:   Ms. Naragon returns as scheduled. She completed cycle 3 FOLFIRI/Avastin 05/16/2016. She had some nausea on the day of pump discontinuation. No mouth sores. She notes her tongue is "sensitive". No diarrhea. She intermittently notes a small amount of blood when she blows her nose. No other bleeding. Stable mild dyspnea on exertion.  Objective:  Vital signs in last 24 hours:  Blood pressure 126/76, pulse (!) 103, temperature 97.9 F (36.6 C), temperature source Oral, resp. rate 17, height 5\' 11"  (1.803 m), weight 172 lb 4.8 oz (78.2 kg), SpO2 99 %.    HEENT: No thrush or ulcers. Resp: Lungs clear bilaterally. Cardio: Regular rate and rhythm. GI: Abdomen soft. Liver palpable mid upper abdomen. No apparent ascites. Vascular: No leg edema. Port-A-Cath without erythema.   Lab Results:  Lab Results  Component Value Date   WBC 8.4 05/30/2016   HGB 9.9 (L) 05/30/2016   HCT 31.9 (L) 05/30/2016   MCV 86.3 05/30/2016   PLT 432 (H) 05/30/2016   NEUTROABS 4.6 05/30/2016    Imaging:  No results found.  Medications: I have reviewed the patient's current medications.  Assessment/Plan: 1. 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 She began adjuvant Xeloda on 05/14/2012. Final cycle of Xeloda started on 10/22/2012.  CT abdomen/pelvis 01/10/2016-innumerable hepatic metastases  CT chest 01/12/2016-multiple pulmonary nodules consistent with metastases  Cycle 1 FOLFOX 02/01/2016  Cycle 2 FOLFOX 02/15/2016  Cycle 3 FOLFOX 02/29/2016  Cycle 4 FOLFOX 03/14/2016  Cycle 5 FOLFOX 03/28/2016  CTs 04/13/2016-progressive lung and liver metastases  Cycle 1 FOLFIRI/Avastin 04/18/2016  Cycle 2 FOLFIRI/Avastin 05/02/2016  Cycle 3 FOLFIRI/Avastin 05/16/2016  Cycle 4 FOLFIRI/Avastin 05/30/2016 2. Severe  microcytic anemia likely secondary to iron deficiency related to bleeding from the colon tumor. The hemoglobin and MCV improved on oral iron. Oral iron was discontinued following office visit 12/16/2012.  Recurrent microcytic anemia July 2017  Stool positive for occult blood 02/01/2016  Referred to GI 3. Diabetes. 4. Hypertension. 5. Hyperlipidemia. 6. History of hand/foot syndrome secondary to Xeloda prompting the Xeloda to be discontinued after day 12, cycle 2. Improved. Xeloda was dose reduced beginning with cycle 3. 7. History of mild diarrhea secondary to Xeloda.  8. Hypercalcemia-Zometa initiated 04/18/2016 9. History of eye tearing-most likely related to seasonal allergies though this could be a manifestation of Xeloda toxicity.  10. Status post root canal 10/02/2012. 11. Colonoscopy 03/13/2013 by Dr. Amedeo Plenty. Pathology on transverse colon polyp showed a tubular adenoma. Next colonoscopy recommendedat a three-year interval. 12. Anorexia/weight loss 13. Port-A-Cath placement by Dr. Dalbert Batman 01/31/2016 14. Early hand-foot syndrome secondary to 5-fluorouracil 15. 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 Aminov appears stable. Performance status continues to improve. She has completed 3 cycles of FOLFIRI/Avastin. Plan to proceed with cycle 4 today as scheduled. She will return for follow-up visit and cycle 5 in 2 weeks. She will contact the office in the interim with any problems.    Ned Card ANP/GNP-BC   05/30/2016  11:03 AM

## 2016-05-30 NOTE — Progress Notes (Signed)
Per Lavella Lemons per Dr. Benay Spice okay to treat today without urine protein. Per Dr. Benay Spice okay to treat with CBG of 59, snack provided and no need to recheck CBG before discharge.

## 2016-05-30 NOTE — Patient Instructions (Signed)

## 2016-05-31 ENCOUNTER — Telehealth: Payer: Self-pay | Admitting: *Deleted

## 2016-05-31 ENCOUNTER — Other Ambulatory Visit: Payer: Self-pay | Admitting: Oncology

## 2016-05-31 NOTE — Telephone Encounter (Signed)
Per LOS I have scheduled appts and notified the scheduler 

## 2016-06-01 ENCOUNTER — Ambulatory Visit (HOSPITAL_BASED_OUTPATIENT_CLINIC_OR_DEPARTMENT_OTHER): Payer: Medicare Other

## 2016-06-01 VITALS — BP 133/82 | HR 111 | Temp 98.7°F | Resp 18

## 2016-06-01 DIAGNOSIS — Z452 Encounter for adjustment and management of vascular access device: Secondary | ICD-10-CM | POA: Diagnosis present

## 2016-06-01 DIAGNOSIS — C182 Malignant neoplasm of ascending colon: Secondary | ICD-10-CM

## 2016-06-01 MED ORDER — SODIUM CHLORIDE 0.9% FLUSH
10.0000 mL | INTRAVENOUS | Status: DC | PRN
Start: 2016-06-01 — End: 2016-06-01
  Administered 2016-06-01: 10 mL
  Filled 2016-06-01: qty 10

## 2016-06-01 MED ORDER — HEPARIN SOD (PORK) LOCK FLUSH 100 UNIT/ML IV SOLN
500.0000 [IU] | Freq: Once | INTRAVENOUS | Status: AC | PRN
  Administered 2016-06-01: 500 [IU]
  Filled 2016-06-01: qty 5

## 2016-06-01 NOTE — Patient Instructions (Signed)

## 2016-06-12 ENCOUNTER — Other Ambulatory Visit: Payer: Self-pay | Admitting: Oncology

## 2016-06-14 ENCOUNTER — Other Ambulatory Visit (HOSPITAL_BASED_OUTPATIENT_CLINIC_OR_DEPARTMENT_OTHER): Payer: Medicare Other

## 2016-06-14 ENCOUNTER — Telehealth: Payer: Self-pay | Admitting: Oncology

## 2016-06-14 ENCOUNTER — Encounter: Payer: Self-pay | Admitting: Nurse Practitioner

## 2016-06-14 ENCOUNTER — Encounter: Payer: Self-pay | Admitting: *Deleted

## 2016-06-14 ENCOUNTER — Ambulatory Visit (HOSPITAL_BASED_OUTPATIENT_CLINIC_OR_DEPARTMENT_OTHER): Payer: Medicare Other

## 2016-06-14 ENCOUNTER — Ambulatory Visit (HOSPITAL_BASED_OUTPATIENT_CLINIC_OR_DEPARTMENT_OTHER): Payer: Medicare Other | Admitting: Nurse Practitioner

## 2016-06-14 VITALS — BP 127/70 | HR 91 | Temp 98.2°F | Resp 17 | Ht 71.0 in | Wt 174.9 lb

## 2016-06-14 VITALS — BP 130/71 | HR 81

## 2016-06-14 DIAGNOSIS — C7800 Secondary malignant neoplasm of unspecified lung: Secondary | ICD-10-CM

## 2016-06-14 DIAGNOSIS — C787 Secondary malignant neoplasm of liver and intrahepatic bile duct: Secondary | ICD-10-CM | POA: Diagnosis not present

## 2016-06-14 DIAGNOSIS — C189 Malignant neoplasm of colon, unspecified: Secondary | ICD-10-CM

## 2016-06-14 DIAGNOSIS — Z452 Encounter for adjustment and management of vascular access device: Secondary | ICD-10-CM | POA: Diagnosis present

## 2016-06-14 DIAGNOSIS — C182 Malignant neoplasm of ascending colon: Secondary | ICD-10-CM

## 2016-06-14 DIAGNOSIS — Z5111 Encounter for antineoplastic chemotherapy: Secondary | ICD-10-CM

## 2016-06-14 DIAGNOSIS — Z5112 Encounter for antineoplastic immunotherapy: Secondary | ICD-10-CM | POA: Diagnosis present

## 2016-06-14 DIAGNOSIS — Z95828 Presence of other vascular implants and grafts: Secondary | ICD-10-CM

## 2016-06-14 LAB — CBC WITH DIFFERENTIAL/PLATELET
BASO%: 0.8 % (ref 0.0–2.0)
BASOS ABS: 0.1 10*3/uL (ref 0.0–0.1)
EOS%: 1 % (ref 0.0–7.0)
Eosinophils Absolute: 0.1 10*3/uL (ref 0.0–0.5)
HCT: 29.8 % — ABNORMAL LOW (ref 34.8–46.6)
HGB: 9.8 g/dL — ABNORMAL LOW (ref 11.6–15.9)
LYMPH%: 25.7 % (ref 14.0–49.7)
MCH: 28.8 pg (ref 25.1–34.0)
MCHC: 32.8 g/dL (ref 31.5–36.0)
MCV: 87.8 fL (ref 79.5–101.0)
MONO#: 1 10*3/uL — ABNORMAL HIGH (ref 0.1–0.9)
MONO%: 12.5 % (ref 0.0–14.0)
NEUT#: 4.7 10*3/uL (ref 1.5–6.5)
NEUT%: 60 % (ref 38.4–76.8)
PLATELETS: 339 10*3/uL (ref 145–400)
RBC: 3.39 10*6/uL — AB (ref 3.70–5.45)
RDW: 21.1 % — ABNORMAL HIGH (ref 11.2–14.5)
WBC: 7.8 10*3/uL (ref 3.9–10.3)
lymph#: 2 10*3/uL (ref 0.9–3.3)

## 2016-06-14 LAB — COMPREHENSIVE METABOLIC PANEL
ALT: 10 U/L (ref 0–55)
AST: 86 U/L — AB (ref 5–34)
Albumin: 3 g/dL — ABNORMAL LOW (ref 3.5–5.0)
Alkaline Phosphatase: 153 U/L — ABNORMAL HIGH (ref 40–150)
Anion Gap: 9 mEq/L (ref 3–11)
BUN: 9.7 mg/dL (ref 7.0–26.0)
CALCIUM: 10.3 mg/dL (ref 8.4–10.4)
CHLORIDE: 110 meq/L — AB (ref 98–109)
CO2: 22 meq/L (ref 22–29)
CREATININE: 0.6 mg/dL (ref 0.6–1.1)
EGFR: >90 mL/min/{1.73_m2} (ref >90)
Glucose: 73 mg/dl (ref 70–140)
POTASSIUM: 3.8 meq/L (ref 3.5–5.1)
Sodium: 142 mEq/L (ref 136–145)
Total Bilirubin: 0.46 mg/dL (ref 0.20–1.20)
Total Protein: 7.4 g/dL (ref 6.4–8.3)

## 2016-06-14 LAB — UA PROTEIN, DIPSTICK - CHCC: PROTEIN: NEGATIVE mg/dL

## 2016-06-14 MED ORDER — DEXAMETHASONE SODIUM PHOSPHATE 10 MG/ML IJ SOLN
10.0000 mg | Freq: Once | INTRAMUSCULAR | Status: AC
  Administered 2016-06-14: 10 mg via INTRAVENOUS

## 2016-06-14 MED ORDER — DEXAMETHASONE SODIUM PHOSPHATE 10 MG/ML IJ SOLN
INTRAMUSCULAR | Status: AC
  Filled 2016-06-14: qty 1

## 2016-06-14 MED ORDER — SODIUM CHLORIDE 0.9 % IV SOLN
2400.0000 mg/m2 | INTRAVENOUS | Status: DC
  Filled 2016-06-14: qty 99

## 2016-06-14 MED ORDER — IRINOTECAN HCL CHEMO INJECTION 100 MG/5ML
125.0000 mg/m2 | Freq: Once | INTRAVENOUS | Status: AC
  Administered 2016-06-14: 260 mg via INTRAVENOUS
  Filled 2016-06-14: qty 5

## 2016-06-14 MED ORDER — HEPARIN SOD (PORK) LOCK FLUSH 100 UNIT/ML IV SOLN
500.0000 [IU] | Freq: Once | INTRAVENOUS | Status: DC | PRN
  Filled 2016-06-14: qty 5

## 2016-06-14 MED ORDER — SODIUM CHLORIDE 0.9 % IV SOLN
400.0000 mg | Freq: Once | INTRAVENOUS | Status: AC
  Administered 2016-06-14: 400 mg via INTRAVENOUS
  Filled 2016-06-14: qty 16

## 2016-06-14 MED ORDER — ATROPINE SULFATE 1 MG/ML IJ SOLN
INTRAMUSCULAR | Status: AC
  Filled 2016-06-14: qty 1

## 2016-06-14 MED ORDER — DEXTROSE 5 % IV SOLN
400.0000 mg/m2 | Freq: Once | INTRAVENOUS | Status: AC
  Administered 2016-06-14: 828 mg via INTRAVENOUS
  Filled 2016-06-14: qty 41.4

## 2016-06-14 MED ORDER — PALONOSETRON HCL INJECTION 0.25 MG/5ML
0.2500 mg | Freq: Once | INTRAVENOUS | Status: AC
  Administered 2016-06-14: 0.25 mg via INTRAVENOUS

## 2016-06-14 MED ORDER — PALONOSETRON HCL INJECTION 0.25 MG/5ML
INTRAVENOUS | Status: AC
  Filled 2016-06-14: qty 5

## 2016-06-14 MED ORDER — SODIUM CHLORIDE 0.9 % IJ SOLN
10.0000 mL | INTRAMUSCULAR | Status: DC | PRN
  Administered 2016-06-14: 10 mL via INTRAVENOUS
  Filled 2016-06-14: qty 10

## 2016-06-14 MED ORDER — SODIUM CHLORIDE 0.9 % IV SOLN
2425.0000 mg/m2 | INTRAVENOUS | Status: DC
  Administered 2016-06-14: 5000 mg via INTRAVENOUS
  Filled 2016-06-14: qty 100

## 2016-06-14 MED ORDER — SODIUM CHLORIDE 0.9% FLUSH
10.0000 mL | INTRAVENOUS | Status: DC | PRN
  Filled 2016-06-14: qty 10

## 2016-06-14 MED ORDER — SODIUM CHLORIDE 0.9 % IV SOLN
Freq: Once | INTRAVENOUS | Status: AC
  Administered 2016-06-14: 13:00:00 via INTRAVENOUS

## 2016-06-14 MED ORDER — ATROPINE SULFATE 1 MG/ML IJ SOLN
0.5000 mg | Freq: Once | INTRAMUSCULAR | Status: AC | PRN
  Administered 2016-06-14: 0.5 mg via INTRAVENOUS

## 2016-06-14 NOTE — Progress Notes (Signed)
Oncology Nurse Navigator Documentation  Oncology Nurse Navigator Flowsheets 06/14/2016  Navigator Location [facility]  Navigator Encounter Type Treatment  Telephone -  Treatment Initiated Date -  Patient Visit Type MedOnc  Treatment Phase Active Tx--FOLFIRI/Avastin  Barriers/Navigation Needs No barriers at this time;No Questions;No Needs  Education -  Interventions -  Coordination of Care -  Education Method -  Support Groups/Services GI Support Group--provided her with January calendar of [facility] activities  Acuity -  Time Spent with Patient 15

## 2016-06-14 NOTE — Telephone Encounter (Signed)
No LOS per 06/14/16 D.O.S.  °

## 2016-06-14 NOTE — Progress Notes (Signed)
Per pharmacy, okay to treat with urine protein from 05/30/16.

## 2016-06-14 NOTE — Patient Instructions (Signed)
[facility] Discharge Instructions for Patients Receiving Chemotherapy  Today you received the following chemotherapy agents: Avastin, Irinotecan, Leucovorin, and Adrucil.   To help prevent nausea and vomiting after your treatment, we encourage you to take your nausea medication as directed.    If you develop nausea and vomiting that is not controlled by your nausea medication, call the clinic.   BELOW ARE SYMPTOMS THAT SHOULD BE REPORTED IMMEDIATELY:  *FEVER GREATER THAN 100.5 F  *CHILLS WITH OR WITHOUT FEVER  NAUSEA AND VOMITING THAT IS NOT CONTROLLED WITH YOUR NAUSEA MEDICATION  *UNUSUAL SHORTNESS OF BREATH  *UNUSUAL BRUISING OR BLEEDING  TENDERNESS IN MOUTH AND THROAT WITH OR WITHOUT PRESENCE OF ULCERS  *URINARY PROBLEMS  *BOWEL PROBLEMS  UNUSUAL RASH Items with * indicate a potential emergency and should be followed up as soon as possible.  Feel free to call the clinic you have any questions or concerns. The clinic phone number is [phone].  Please show the CHEMO ALERT CARD at check-in to the Emergency Department and triage nurse.   

## 2016-06-14 NOTE — Progress Notes (Signed)
HPI:  Judith Stephens 75 y.o. female diagnosed with colon cancer with both lung and liver metastasis.  Early undergoing Folfiri/Avastin chemotherapy regimen.    No history exists.    Review of Systems  All other systems reviewed and are negative.   Past Medical History:  Diagnosis Date  . Anemia    hx of blood transfusion 03/06/12   . Anxiety   . Arthritis   . Blood transfusion   . Cancer [facility])    colon cancer  . Diabetes mellitus   . Hyperlipidemia   . Hypertension   . Hypothyroidism     Past Surgical History:  Procedure Laterality Date  . COLON SURGERY  2013  . cyst on ovary Right    removed  . LAPAROSCOPY  03/14/2012   Procedure: LAPAROSCOPY DIAGNOSTIC;  Surgeon: Adin Hector, MD;  Location: [facility];  Service: General;;  right colectomy  . PORTACATH PLACEMENT N/A 01/31/2016   Procedure: INSERTION PORT-A-CATH WITH ULTRA SOUND;  Surgeon: Fanny Skates, MD;  Location: [facility];  Service: General;  Laterality: N/A;  . TUBAL LIGATION      has Normocytic anemia; Colon cancer ([place]); Diabetes mellitus type II, non insulin dependent ([place]); HTN (hypertension); Incisional hernia; Sepsis, unspecified organism ([place]); Liver metastasis ([place]); Lung metastasis ([place]); Port catheter in place; Syncope; Severe protein-calorie malnutrition ([place]); Pneumonia; Hypercalcemia; Hypothyroidism; and Healthcare-associated pneumonia on her problem list.    is allergic to no known allergies.  Allergies as of 06/14/2016      Reactions   No Known Allergies Other (See Comments)      Medication List       Accurate as of 06/14/16 12:29 PM. Always use your most recent med list.          aspirin EC 81 MG tablet Take 81 mg by mouth daily.   feeding supplement (ENSURE ENLIVE) Liqd Take 237 mLs by mouth 2 (two) times daily between meals.   fluticasone 50 MCG/ACT nasal spray Commonly known as:  FLONASE Place 2 sprays into the nose daily as needed for rhinitis. For allergies     HYDROcodone-acetaminophen 5-325 MG tablet Commonly known as:  NORCO/VICODIN Take 1-2 tablets by mouth every 4 (four) hours as needed for moderate pain or severe pain.   levothyroxine 150 MCG tablet Commonly known as:  SYNTHROID, LEVOTHROID Take 150 mcg by mouth daily before breakfast.   lidocaine-prilocaine cream Commonly known as:  EMLA Apply 1 application topically as needed. Apply 1 hr prior to port access and cover with plastic wrap   loratadine 10 MG tablet Commonly known as:  CLARITIN Take 10 mg by mouth daily as needed for allergies.   LORazepam 0.5 MG tablet Commonly known as:  ATIVAN Take 1 tablet (0.5 mg total) by mouth at bedtime as needed for anxiety or sleep.   montelukast 10 MG tablet Commonly known as:  SINGULAIR TAKE 1 TABLET BY MOUTH EVERY EVENING   montelukast 10 MG tablet Commonly known as:  SINGULAIR TAKE 1 TABLET BY MOUTH EVERY EVENING   ondansetron 8 MG tablet Commonly known as:  ZOFRAN Take 1 tablet (8 mg total) by mouth every 8 (eight) hours as needed for nausea or vomiting.   potassium chloride SA 20 MEQ tablet Commonly known as:  K-DUR,KLOR-CON Take 1 tablet (20 mEq total) by mouth daily.   prochlorperazine 5 MG tablet Commonly known as:  COMPAZINE Take 1 tablet (5 mg total) by mouth every 6 (six) hours as needed for nausea or vomiting.  PHYSICAL EXAMINATION  Oncology Vitals 06/14/2016 06/01/2016  Height 180 cm -  Weight 79.334 kg -  Weight (lbs) 174 lbs 14 oz -  BMI (kg/m2) 24.39 kg/m2 -  Temp 98.2 98.7  Pulse 91 111  Resp 17 18  SpO2 99 99  BSA (m2) 1.99 m2 -   BP Readings from Last 2 Encounters:  06/14/16 127/70  06/01/16 133/82    Physical Exam  Constitutional: She is oriented to person, place, and time and well-developed, well-nourished, and in no distress.  HENT:  Head: Normocephalic and atraumatic.  Eyes: Conjunctivae and EOM are normal. Pupils are equal, round, and reactive to light. Right eye exhibits no  discharge. Left eye exhibits no discharge. No scleral icterus.  Neck: Normal range of motion.  Pulmonary/Chest: Effort normal. No respiratory distress.  Musculoskeletal: Normal range of motion.  Neurological: She is alert and oriented to person, place, and time. Gait normal.  Psychiatric: Affect normal.  Nursing note and vitals reviewed.   LABORATORY DATA:. Appointment on 06/14/2016  Component Date Value Ref Range Status  . WBC 06/14/2016 7.8  3.9 - 10.3 10e3/uL Final  . NEUT# 06/14/2016 4.7  1.5 - 6.5 10e3/uL Final  . HGB 06/14/2016 9.8* 11.6 - 15.9 g/dL Final  . HCT 06/14/2016 29.8* 34.8 - 46.6 % Final  . Platelets 06/14/2016 339  145 - 400 10e3/uL Final  . MCV 06/14/2016 87.8  79.5 - 101.0 fL Final  . MCH 06/14/2016 28.8  25.1 - 34.0 pg Final  . MCHC 06/14/2016 32.8  31.5 - 36.0 g/dL Final  . RBC 06/14/2016 3.39* 3.70 - 5.45 10e6/uL Final  . RDW 06/14/2016 21.1* 11.2 - 14.5 % Final  . lymph# 06/14/2016 2.0  0.9 - 3.3 10e3/uL Final  . MONO# 06/14/2016 1.0* 0.1 - 0.9 10e3/uL Final  . Eosinophils Absolute 06/14/2016 0.1  0.0 - 0.5 10e3/uL Final  . Basophils Absolute 06/14/2016 0.1  0.0 - 0.1 10e3/uL Final  . NEUT% 06/14/2016 60.0  38.4 - 76.8 % Final  . LYMPH% 06/14/2016 25.7  14.0 - 49.7 % Final  . MONO% 06/14/2016 12.5  0.0 - 14.0 % Final  . EOS% 06/14/2016 1.0  0.0 - 7.0 % Final  . BASO% 06/14/2016 0.8  0.0 - 2.0 % Final  . Sodium 06/14/2016 142  136 - 145 mEq/L Final  . Potassium 06/14/2016 3.8  3.5 - 5.1 mEq/L Final  . Chloride 06/14/2016 110* 98 - 109 mEq/L Final  . CO2 06/14/2016 22  22 - 29 mEq/L Final  . Glucose 06/14/2016 73  70 - 140 mg/dl Final  . BUN 06/14/2016 9.7  7.0 - 26.0 mg/dL Final  . Creatinine 06/14/2016 0.6  0.6 - 1.1 mg/dL Final  . Total Bilirubin 06/14/2016 0.46  0.20 - 1.20 mg/dL Final  . Alkaline Phosphatase 06/14/2016 153* 40 - 150 U/L Final  . AST 06/14/2016 86* 5 - 34 U/L Final  . ALT 06/14/2016 10  0 - 55 U/L Final  . Total Protein 06/14/2016  7.4  6.4 - 8.3 g/dL Final  . Albumin 06/14/2016 3.0* 3.5 - 5.0 g/dL Final  . Calcium 06/14/2016 10.3  8.4 - 10.4 mg/dL Final  . Anion Gap 06/14/2016 9  3 - 11 mEq/L Final  . EGFR 06/14/2016 >90  >90 ml/min/1.73 m2 Final    RADIOGRAPHIC STUDIES: No results found.  ASSESSMENT/PLAN:    Colon cancer [facility]) Patient presented to the [facility] today to receive cycle 5 of Folfiri/Avastin chemotherapy regimen.  She states that she  did fairly well with cycle 4 of her chemotherapy.  She states that her appetite has improved and she has no nausea, vomiting, diarrhea, or constipation issues.  Chills denies any mouth sores or neuropathy issues.  She denies any recent fevers or chills.  Brief exam today revealed.  Patient appears well and in no acute distress whatsoever.  Labs were all within normal limits.  Patient will proceed today with cycle 5 of her chemotherapy as planned.  Patient will return on 06/16/2016 for her pump discontinuation.  Both patient and her husband are asking (her next scan will be scheduled.  Advised the patient and her husband that Dr. Benay Spice, or his nurse practitioner will inform them of the next planned restaging scan when she returns for labs, flush, visit, and her next cycle of chemotherapy on 06/27/2016.   Patient stated understanding of all instructions; and was in agreement with this plan of care. The patient knows to call the clinic with any problems, questions or concerns.   Total time spent with patient was 15 minutes;  with greater than 75 percent of that time spent in face to face counseling regarding patient's symptoms,  and coordination of care and follow up.  Disclaimer:This dictation was prepared with Dragon/digital dictation along with [employer]. Any transcriptional errors that result from this process are unintentional.  Drue Second, NP 06/14/2016

## 2016-06-14 NOTE — Assessment & Plan Note (Signed)
Patient presented to the [facility] today to receive cycle 5 of Folfiri/Avastin chemotherapy regimen.  She states that she did fairly well with cycle 4 of her chemotherapy.  She states that her appetite has improved and she has no nausea, vomiting, diarrhea, or constipation issues.  Chills denies any mouth sores or neuropathy issues.  She denies any recent fevers or chills.  Brief exam today revealed.  Patient appears well and in no acute distress whatsoever.  Labs were all within normal limits.  Patient will proceed today with cycle 5 of her chemotherapy as planned.  Patient will return on 06/16/2016 for her pump discontinuation.  Both patient and her husband are asking (her next scan will be scheduled.  Advised the patient and her husband that Dr. Benay Spice, or his nurse practitioner will inform them of the next planned restaging scan when she returns for labs, flush, visit, and her next cycle of chemotherapy on 06/27/2016.

## 2016-06-16 ENCOUNTER — Ambulatory Visit (HOSPITAL_BASED_OUTPATIENT_CLINIC_OR_DEPARTMENT_OTHER): Payer: Medicare Other

## 2016-06-16 VITALS — BP 113/72 | HR 106 | Temp 99.9°F | Resp 16

## 2016-06-16 DIAGNOSIS — Z452 Encounter for adjustment and management of vascular access device: Secondary | ICD-10-CM

## 2016-06-16 DIAGNOSIS — C182 Malignant neoplasm of ascending colon: Secondary | ICD-10-CM | POA: Diagnosis not present

## 2016-06-16 MED ORDER — HEPARIN SOD (PORK) LOCK FLUSH 100 UNIT/ML IV SOLN
500.0000 [IU] | Freq: Once | INTRAVENOUS | Status: AC | PRN
  Administered 2016-06-16: 500 [IU]
  Filled 2016-06-16: qty 5

## 2016-06-16 MED ORDER — SODIUM CHLORIDE 0.9% FLUSH
10.0000 mL | INTRAVENOUS | Status: DC | PRN
  Administered 2016-06-16: 10 mL
  Filled 2016-06-16: qty 10

## 2016-06-20 ENCOUNTER — Other Ambulatory Visit: Payer: Self-pay | Admitting: Nurse Practitioner

## 2016-06-20 DIAGNOSIS — C787 Secondary malignant neoplasm of liver and intrahepatic bile duct: Secondary | ICD-10-CM

## 2016-06-20 DIAGNOSIS — C7800 Secondary malignant neoplasm of unspecified lung: Secondary | ICD-10-CM

## 2016-06-20 DIAGNOSIS — C182 Malignant neoplasm of ascending colon: Secondary | ICD-10-CM

## 2016-06-22 ENCOUNTER — Other Ambulatory Visit: Payer: Self-pay | Admitting: *Deleted

## 2016-06-22 MED ORDER — HYDROCODONE-ACETAMINOPHEN 5-325 MG PO TABS: 1.0000 | ORAL_TABLET | ORAL | 0 refills | Status: DC | PRN

## 2016-06-22 NOTE — Telephone Encounter (Signed)
Call from pt requesting refill on Hydrocodone for abdominal pain. She reports she has had this pain in the past. Takes Hydrocodone infrequently. Denies nausea or vomiting, bowels are moving.  Pt informed that script is ready for pick up.

## 2016-06-25 ENCOUNTER — Other Ambulatory Visit: Payer: Self-pay | Admitting: Oncology

## 2016-06-27 ENCOUNTER — Ambulatory Visit: Payer: Medicare Other

## 2016-06-27 ENCOUNTER — Other Ambulatory Visit (HOSPITAL_BASED_OUTPATIENT_CLINIC_OR_DEPARTMENT_OTHER): Payer: Medicare Other

## 2016-06-27 ENCOUNTER — Ambulatory Visit (HOSPITAL_BASED_OUTPATIENT_CLINIC_OR_DEPARTMENT_OTHER): Payer: Medicare Other | Admitting: Nurse Practitioner

## 2016-06-27 ENCOUNTER — Telehealth: Payer: Self-pay | Admitting: Nurse Practitioner

## 2016-06-27 ENCOUNTER — Ambulatory Visit (HOSPITAL_BASED_OUTPATIENT_CLINIC_OR_DEPARTMENT_OTHER): Payer: Medicare Other

## 2016-06-27 ENCOUNTER — Telehealth: Payer: Self-pay | Admitting: *Deleted

## 2016-06-27 VITALS — BP 137/71 | HR 96 | Temp 97.8°F | Resp 18 | Wt 177.1 lb

## 2016-06-27 DIAGNOSIS — Z452 Encounter for adjustment and management of vascular access device: Secondary | ICD-10-CM

## 2016-06-27 DIAGNOSIS — C787 Secondary malignant neoplasm of liver and intrahepatic bile duct: Secondary | ICD-10-CM | POA: Diagnosis not present

## 2016-06-27 DIAGNOSIS — E119 Type 2 diabetes mellitus without complications: Secondary | ICD-10-CM | POA: Diagnosis not present

## 2016-06-27 DIAGNOSIS — C182 Malignant neoplasm of ascending colon: Secondary | ICD-10-CM

## 2016-06-27 DIAGNOSIS — Z5111 Encounter for antineoplastic chemotherapy: Secondary | ICD-10-CM

## 2016-06-27 DIAGNOSIS — G620 Drug-induced polyneuropathy: Secondary | ICD-10-CM

## 2016-06-27 DIAGNOSIS — I10 Essential (primary) hypertension: Secondary | ICD-10-CM | POA: Diagnosis not present

## 2016-06-27 DIAGNOSIS — C189 Malignant neoplasm of colon, unspecified: Secondary | ICD-10-CM

## 2016-06-27 DIAGNOSIS — Z5112 Encounter for antineoplastic immunotherapy: Secondary | ICD-10-CM | POA: Diagnosis present

## 2016-06-27 DIAGNOSIS — C7800 Secondary malignant neoplasm of unspecified lung: Secondary | ICD-10-CM | POA: Diagnosis not present

## 2016-06-27 DIAGNOSIS — Z95828 Presence of other vascular implants and grafts: Secondary | ICD-10-CM

## 2016-06-27 LAB — CBC WITH DIFFERENTIAL/PLATELET
BASO%: 0.7 % (ref 0.0–2.0)
Basophils Absolute: 0.1 10*3/uL (ref 0.0–0.1)
EOS ABS: 0 10*3/uL (ref 0.0–0.5)
EOS%: 0.5 % (ref 0.0–7.0)
HEMATOCRIT: 32 % — AB (ref 34.8–46.6)
HGB: 9.9 g/dL — ABNORMAL LOW (ref 11.6–15.9)
LYMPH#: 2.4 10*3/uL (ref 0.9–3.3)
LYMPH%: 32.3 % (ref 14.0–49.7)
MCH: 28 pg (ref 25.1–34.0)
MCHC: 30.9 g/dL — ABNORMAL LOW (ref 31.5–36.0)
MCV: 90.7 fL (ref 79.5–101.0)
MONO#: 0.9 10*3/uL (ref 0.1–0.9)
MONO%: 11.9 % (ref 0.0–14.0)
NEUT%: 54.6 % (ref 38.4–76.8)
NEUTROS ABS: 4.1 10*3/uL (ref 1.5–6.5)
PLATELETS: 397 10*3/uL (ref 145–400)
RBC: 3.53 10*6/uL — ABNORMAL LOW (ref 3.70–5.45)
RDW: 19.3 % — ABNORMAL HIGH (ref 11.2–14.5)
WBC: 7.5 10*3/uL (ref 3.9–10.3)

## 2016-06-27 LAB — COMPREHENSIVE METABOLIC PANEL
ALK PHOS: 173 U/L — AB (ref 40–150)
ALT: 12 U/L (ref 0–55)
ANION GAP: 9 meq/L (ref 3–11)
AST: 63 U/L — ABNORMAL HIGH (ref 5–34)
Albumin: 2.9 g/dL — ABNORMAL LOW (ref 3.5–5.0)
BUN: 10.5 mg/dL (ref 7.0–26.0)
CALCIUM: 11 mg/dL — AB (ref 8.4–10.4)
CO2: 23 mEq/L (ref 22–29)
CREATININE: 0.7 mg/dL (ref 0.6–1.1)
Chloride: 109 mEq/L (ref 98–109)
Glucose: 60 mg/dl — ABNORMAL LOW (ref 70–140)
Potassium: 4.1 mEq/L (ref 3.5–5.1)
Sodium: 141 mEq/L (ref 136–145)
TOTAL PROTEIN: 7.5 g/dL (ref 6.4–8.3)
Total Bilirubin: 0.43 mg/dL (ref 0.20–1.20)

## 2016-06-27 LAB — CEA (IN HOUSE-CHCC): CEA (CHCC-IN HOUSE): 99.19 ng/mL — AB (ref 0.00–5.00)

## 2016-06-27 LAB — WHOLE BLOOD GLUCOSE: Glucose: 108 mg/dL — ABNORMAL HIGH (ref 70–100)

## 2016-06-27 LAB — UA PROTEIN, DIPSTICK - CHCC

## 2016-06-27 MED ORDER — HYDROCODONE-ACETAMINOPHEN 5-325 MG PO TABS: 1.0000 | ORAL_TABLET | ORAL | 0 refills | Status: DC | PRN

## 2016-06-27 MED ORDER — PALONOSETRON HCL INJECTION 0.25 MG/5ML
INTRAVENOUS | Status: AC
  Filled 2016-06-27: qty 5

## 2016-06-27 MED ORDER — SODIUM CHLORIDE 0.9 % IV SOLN
Freq: Once | INTRAVENOUS | Status: AC
  Administered 2016-06-27: 12:00:00 via INTRAVENOUS

## 2016-06-27 MED ORDER — ATROPINE SULFATE 1 MG/ML IJ SOLN
INTRAMUSCULAR | Status: AC
  Filled 2016-06-27: qty 1

## 2016-06-27 MED ORDER — PROCHLORPERAZINE MALEATE 5 MG PO TABS: 5.0000 mg | ORAL_TABLET | Freq: Four times a day (QID) | ORAL | 1 refills | Status: DC | PRN

## 2016-06-27 MED ORDER — PALONOSETRON HCL INJECTION 0.25 MG/5ML
0.2500 mg | Freq: Once | INTRAVENOUS | Status: AC
Start: 2016-06-27 — End: 2016-06-27
  Administered 2016-06-27: 0.25 mg via INTRAVENOUS

## 2016-06-27 MED ORDER — LEUCOVORIN CALCIUM INJECTION 350 MG
400.0000 mg/m2 | Freq: Once | INTRAVENOUS | Status: AC
  Administered 2016-06-27: 828 mg via INTRAVENOUS
  Filled 2016-06-27: qty 41.4

## 2016-06-27 MED ORDER — DEXAMETHASONE SODIUM PHOSPHATE 10 MG/ML IJ SOLN
INTRAMUSCULAR | Status: AC
  Filled 2016-06-27: qty 1

## 2016-06-27 MED ORDER — IRINOTECAN HCL CHEMO INJECTION 100 MG/5ML
125.0000 mg/m2 | Freq: Once | INTRAVENOUS | Status: AC
  Administered 2016-06-27: 260 mg via INTRAVENOUS
  Filled 2016-06-27: qty 5

## 2016-06-27 MED ORDER — DEXAMETHASONE SODIUM PHOSPHATE 10 MG/ML IJ SOLN
10.0000 mg | Freq: Once | INTRAMUSCULAR | Status: AC
  Administered 2016-06-27: 10 mg via INTRAVENOUS

## 2016-06-27 MED ORDER — SODIUM CHLORIDE 0.9 % IV SOLN
4.8000 mg/kg | Freq: Once | INTRAVENOUS | Status: AC
  Administered 2016-06-27: 400 mg via INTRAVENOUS
  Filled 2016-06-27: qty 16

## 2016-06-27 MED ORDER — FLUOROURACIL CHEMO INJECTION 5 GM/100ML
2420.0000 mg/m2 | INTRAVENOUS | Status: DC
  Administered 2016-06-27: 5000 mg via INTRAVENOUS
  Filled 2016-06-27: qty 100

## 2016-06-27 MED ORDER — ATROPINE SULFATE 1 MG/ML IJ SOLN
0.5000 mg | Freq: Once | INTRAMUSCULAR | Status: AC | PRN
  Administered 2016-06-27: 0.5 mg via INTRAVENOUS

## 2016-06-27 MED ORDER — SODIUM CHLORIDE 0.9 % IJ SOLN
10.0000 mL | INTRAMUSCULAR | Status: DC | PRN
  Administered 2016-06-27: 10 mL via INTRAVENOUS
  Filled 2016-06-27: qty 10

## 2016-06-27 NOTE — Patient Instructions (Signed)
[facility] Discharge Instructions for Patients Receiving Chemotherapy  Today you received the following chemotherapy agents Avastin/5 FU/Leucovorin/Irinotecan To help prevent nausea and vomiting after your treatment, we encourage you to take your nausea medication as prescribed.   If you develop nausea and vomiting that is not controlled by your nausea medication, call the clinic.   BELOW ARE SYMPTOMS THAT SHOULD BE REPORTED IMMEDIATELY:  *FEVER GREATER THAN 100.5 F  *CHILLS WITH OR WITHOUT FEVER  NAUSEA AND VOMITING THAT IS NOT CONTROLLED WITH YOUR NAUSEA MEDICATION  *UNUSUAL SHORTNESS OF BREATH  *UNUSUAL BRUISING OR BLEEDING  TENDERNESS IN MOUTH AND THROAT WITH OR WITHOUT PRESENCE OF ULCERS  *URINARY PROBLEMS  *BOWEL PROBLEMS  UNUSUAL RASH Items with * indicate a potential emergency and should be followed up as soon as possible.  Feel free to call the clinic you have any questions or concerns. The clinic phone number is [phone].  Please show the CHEMO ALERT CARD at check-in to the Emergency Department and triage nurse.   

## 2016-06-27 NOTE — Telephone Encounter (Signed)
Appointments scheduled per 1/9 LOS. Patient given AVS report and calendars with future scheduled appointments. °

## 2016-06-27 NOTE — Progress Notes (Signed)
Repeat CBG 108 mg/dL reported to Ned Card, NP

## 2016-06-27 NOTE — Telephone Encounter (Signed)
Per 1/8 LOS I have schedule and I gave calendar

## 2016-06-27 NOTE — Progress Notes (Addendum)
[place] OFFICE PROGRESS NOTE   Diagnosis:  Colon cancer  INTERVAL HISTORY:   Judith Stephens returns as scheduled. She completed cycle 5 FOLFIRI/Avastin 06/14/2016. She had more nausea than with previous cycles. Her tongue became tender. No ulcers. No diarrhea. She continues to note blood with nose blowing. She has mild dyspnea on exertion. No cough or fever. She has intermittent abdominal pain. She takes hydrocodone as needed.  Objective:  Vital signs in last 24 hours:  Blood pressure 137/71, pulse 96, temperature 97.8 F (36.6 C), temperature source Oral, resp. rate 18, weight 177 lb 1.6 oz (80.3 kg), SpO2 100 %.    HEENT: No thrush or ulcers. Resp: Lungs clear bilaterally. Cardio: Regular rate and rhythm. GI: Liver palpable mid upper abdomen with associated tenderness. No apparent ascites. Vascular: No leg edema. Port-A-Cath without erythema.  Lab Results:  Lab Results  Component Value Date   WBC 7.5 06/27/2016   HGB 9.9 (L) 06/27/2016   HCT 32.0 (L) 06/27/2016   MCV 90.7 06/27/2016   PLT 397 06/27/2016   NEUTROABS 4.1 06/27/2016    Imaging:  No results found.  Medications: I have reviewed the patient's current medications.  Assessment/Plan: 1. 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 She began adjuvant Xeloda on 05/14/2012. Final cycle of Xeloda started on 10/22/2012.  CT abdomen/pelvis 01/10/2016-innumerable hepatic metastases  CT chest 01/12/2016-multiple pulmonary nodules consistent with metastases  Cycle 1 FOLFOX 02/01/2016  Cycle 2 FOLFOX 02/15/2016  Cycle 3 FOLFOX 02/29/2016  Cycle 4 FOLFOX 03/14/2016  Cycle 5 FOLFOX 03/28/2016  CTs 04/13/2016-progressive lung and liver metastases  Cycle 1 FOLFIRI/Avastin 04/18/2016  Cycle 2 FOLFIRI/Avastin 05/02/2016  Cycle 3 FOLFIRI/Avastin 05/16/2016  Cycle 4 FOLFIRI/Avastin 05/30/2016  Cycle 5  FOLFIRI/Avastin 06/14/2016  Cycle 6 FOLFIRI/Avastin 06/27/2016 2. Severe microcytic anemia likely secondary to iron deficiency related to bleeding from the colon tumor. The hemoglobin and MCV improved on oral iron. Oral iron was discontinued following office visit 12/16/2012.  Recurrent microcytic anemia July 2017  Stool positive for occult blood 02/01/2016  Referred to GI 3. Diabetes. 4. Hypertension. 5. Hyperlipidemia. 6. History of hand/foot syndrome secondary to Xeloda prompting the Xeloda to be discontinued after day 12, cycle 2. Improved. Xeloda was dose reduced beginning with cycle 3. 7. History of mild diarrhea secondary to Xeloda.  8. Hypercalcemia-Zometa initiated 04/18/2016 9. History of eye tearing-most likely related to seasonal allergies though this could be a manifestation of Xeloda toxicity.  10. Status post root canal 10/02/2012. 11. Colonoscopy 03/13/2013 by Dr. Amedeo Plenty. Pathology on transverse colon polyp showed a tubular adenoma. Next colonoscopy recommendedat a three-year interval. 12. Anorexia/weight loss 13. Port-A-Cath placement by Dr. Dalbert Batman 01/31/2016 14. Early hand-foot syndrome secondary to 5-fluorouracil 15. 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 She has completed 5 cycles of FOLFIRI/Avastin. Plan to proceed with cycle 6 today as scheduled. We will obtain restaging CT scans prior to her next visit in 2 weeks.   She is hypercalcemic intermittently. She appears asymptomatic. We will continue to monitor.  She will return for a follow-up visit on 07/11/2016. She will contact the office in the interim as outlined above or with any other problems.  Patient seen with Dr. Benay Spice.  Ned Card ANP/GNP-BC   06/27/2016  10:59 AM This was a shared visit with Ned Card. Judith Stephens will complete cycle 6 FOLFIRI/Avastin today. She  will be  scheduled for a restaging CT after this cycle.  Julieanne Manson, M.D.

## 2016-06-29 ENCOUNTER — Ambulatory Visit (HOSPITAL_BASED_OUTPATIENT_CLINIC_OR_DEPARTMENT_OTHER): Payer: Medicare Other

## 2016-06-29 VITALS — BP 136/83 | HR 97 | Temp 98.6°F | Resp 18

## 2016-06-29 DIAGNOSIS — C182 Malignant neoplasm of ascending colon: Secondary | ICD-10-CM | POA: Diagnosis not present

## 2016-06-29 DIAGNOSIS — Z452 Encounter for adjustment and management of vascular access device: Secondary | ICD-10-CM

## 2016-06-29 MED ORDER — SODIUM CHLORIDE 0.9% FLUSH
10.0000 mL | INTRAVENOUS | Status: DC | PRN
  Administered 2016-06-29: 10 mL
  Filled 2016-06-29: qty 10

## 2016-06-29 MED ORDER — HEPARIN SOD (PORK) LOCK FLUSH 100 UNIT/ML IV SOLN
500.0000 [IU] | Freq: Once | INTRAVENOUS | Status: AC | PRN
  Administered 2016-06-29: 500 [IU]
  Filled 2016-06-29: qty 5

## 2016-06-29 NOTE — Patient Instructions (Signed)

## 2016-07-04 ENCOUNTER — Telehealth: Payer: Self-pay | Admitting: *Deleted

## 2016-07-04 NOTE — Telephone Encounter (Signed)
Returned call to pt, she has not had any more episodes of hemoptysis today. Reports exertional dyspnea "no worse than any other time." Pt reports she is only coughing in AM.  Instructed her to call office for dyspnea at rest, chest pain or persistent hemoptysis, per Dr. Benay Spice.

## 2016-07-04 NOTE — Telephone Encounter (Signed)
Pt reports that she "coughed up some blood this morning" . States it was dark with red around the edges. Only happened once. Has previously noted some blood with blowing nose- states Dr Benay Spice and Ned Card, NP were aware.

## 2016-07-07 ENCOUNTER — Ambulatory Visit (HOSPITAL_COMMUNITY)
Admission: RE | Admit: 2016-07-07 | Discharge: 2016-07-07 | Disposition: A | Discharge status: Home / Self Care | Payer: Medicare Other | Source: Ambulatory Visit | Attending: Nurse Practitioner | Admitting: Nurse Practitioner

## 2016-07-07 DIAGNOSIS — C7800 Secondary malignant neoplasm of unspecified lung: Secondary | ICD-10-CM | POA: Diagnosis not present

## 2016-07-07 DIAGNOSIS — C787 Secondary malignant neoplasm of liver and intrahepatic bile duct: Secondary | ICD-10-CM | POA: Insufficient documentation

## 2016-07-07 DIAGNOSIS — K8020 Calculus of gallbladder without cholecystitis without obstruction: Secondary | ICD-10-CM | POA: Diagnosis not present

## 2016-07-07 DIAGNOSIS — R188 Other ascites: Secondary | ICD-10-CM | POA: Diagnosis not present

## 2016-07-07 DIAGNOSIS — R590 Localized enlarged lymph nodes: Secondary | ICD-10-CM | POA: Diagnosis not present

## 2016-07-07 DIAGNOSIS — J90 Pleural effusion, not elsewhere classified: Secondary | ICD-10-CM | POA: Diagnosis not present

## 2016-07-07 DIAGNOSIS — C189 Malignant neoplasm of colon, unspecified: Secondary | ICD-10-CM | POA: Diagnosis not present

## 2016-07-07 MED ORDER — IOPAMIDOL (ISOVUE-300) INJECTION 61%
100.0000 mL | Freq: Once | INTRAVENOUS | Status: AC | PRN
  Administered 2016-07-07: 100 mL via INTRAVENOUS

## 2016-07-07 MED ORDER — IOPAMIDOL (ISOVUE-300) INJECTION 61%
INTRAVENOUS | Status: AC
  Filled 2016-07-07: qty 100

## 2016-07-11 ENCOUNTER — Ambulatory Visit (HOSPITAL_BASED_OUTPATIENT_CLINIC_OR_DEPARTMENT_OTHER): Payer: Medicare Other | Admitting: Nurse Practitioner

## 2016-07-11 ENCOUNTER — Other Ambulatory Visit (HOSPITAL_BASED_OUTPATIENT_CLINIC_OR_DEPARTMENT_OTHER): Payer: Medicare Other

## 2016-07-11 ENCOUNTER — Ambulatory Visit (HOSPITAL_BASED_OUTPATIENT_CLINIC_OR_DEPARTMENT_OTHER): Payer: Medicare Other

## 2016-07-11 ENCOUNTER — Telehealth: Payer: Self-pay | Admitting: *Deleted

## 2016-07-11 ENCOUNTER — Telehealth: Payer: Self-pay | Admitting: Oncology

## 2016-07-11 VITALS — BP 118/81 | HR 89 | Temp 98.4°F | Resp 18 | Ht 71.0 in | Wt 177.9 lb

## 2016-07-11 DIAGNOSIS — E119 Type 2 diabetes mellitus without complications: Secondary | ICD-10-CM

## 2016-07-11 DIAGNOSIS — G620 Drug-induced polyneuropathy: Secondary | ICD-10-CM

## 2016-07-11 DIAGNOSIS — C182 Malignant neoplasm of ascending colon: Secondary | ICD-10-CM | POA: Diagnosis not present

## 2016-07-11 DIAGNOSIS — Z452 Encounter for adjustment and management of vascular access device: Secondary | ICD-10-CM | POA: Diagnosis not present

## 2016-07-11 DIAGNOSIS — D509 Iron deficiency anemia, unspecified: Secondary | ICD-10-CM

## 2016-07-11 DIAGNOSIS — C7800 Secondary malignant neoplasm of unspecified lung: Secondary | ICD-10-CM | POA: Diagnosis not present

## 2016-07-11 DIAGNOSIS — C189 Malignant neoplasm of colon, unspecified: Secondary | ICD-10-CM

## 2016-07-11 DIAGNOSIS — Z5111 Encounter for antineoplastic chemotherapy: Secondary | ICD-10-CM

## 2016-07-11 DIAGNOSIS — C787 Secondary malignant neoplasm of liver and intrahepatic bile duct: Secondary | ICD-10-CM

## 2016-07-11 DIAGNOSIS — Z95828 Presence of other vascular implants and grafts: Secondary | ICD-10-CM

## 2016-07-11 DIAGNOSIS — I10 Essential (primary) hypertension: Secondary | ICD-10-CM | POA: Diagnosis not present

## 2016-07-11 DIAGNOSIS — Z5112 Encounter for antineoplastic immunotherapy: Secondary | ICD-10-CM | POA: Diagnosis present

## 2016-07-11 LAB — CBC WITH DIFFERENTIAL/PLATELET
BASO%: 0.9 % (ref 0.0–2.0)
Basophils Absolute: 0.1 10*3/uL (ref 0.0–0.1)
EOS%: 1.6 % (ref 0.0–7.0)
Eosinophils Absolute: 0.1 10*3/uL (ref 0.0–0.5)
HCT: 30.9 % — ABNORMAL LOW (ref 34.8–46.6)
HEMOGLOBIN: 10.1 g/dL — AB (ref 11.6–15.9)
LYMPH#: 1.6 10*3/uL (ref 0.9–3.3)
LYMPH%: 25.2 % (ref 14.0–49.7)
MCH: 29.4 pg (ref 25.1–34.0)
MCHC: 32.6 g/dL (ref 31.5–36.0)
MCV: 90 fL (ref 79.5–101.0)
MONO#: 0.9 10*3/uL (ref 0.1–0.9)
MONO%: 14.5 % — ABNORMAL HIGH (ref 0.0–14.0)
NEUT%: 57.8 % (ref 38.4–76.8)
NEUTROS ABS: 3.6 10*3/uL (ref 1.5–6.5)
Platelets: 263 10*3/uL (ref 145–400)
RBC: 3.44 10*6/uL — ABNORMAL LOW (ref 3.70–5.45)
RDW: 18.5 % — AB (ref 11.2–14.5)
WBC: 6.3 10*3/uL (ref 3.9–10.3)

## 2016-07-11 LAB — COMPREHENSIVE METABOLIC PANEL
ALBUMIN: 2.9 g/dL — AB (ref 3.5–5.0)
ALK PHOS: 192 U/L — AB (ref 40–150)
ALT: 11 U/L (ref 0–55)
AST: 76 U/L — AB (ref 5–34)
Anion Gap: 9 mEq/L (ref 3–11)
BILIRUBIN TOTAL: 0.47 mg/dL (ref 0.20–1.20)
BUN: 9.9 mg/dL (ref 7.0–26.0)
CALCIUM: 10.9 mg/dL — AB (ref 8.4–10.4)
CHLORIDE: 108 meq/L (ref 98–109)
CO2: 23 mEq/L (ref 22–29)
CREATININE: 0.7 mg/dL (ref 0.6–1.1)
EGFR: >90 mL/min/{1.73_m2} (ref >90)
Glucose: 65 mg/dl — ABNORMAL LOW (ref 70–140)
Potassium: 3.9 mEq/L (ref 3.5–5.1)
Sodium: 140 mEq/L (ref 136–145)
TOTAL PROTEIN: 7.2 g/dL (ref 6.4–8.3)

## 2016-07-11 LAB — CEA (IN HOUSE-CHCC): CEA (CHCC-In House): 188.17 ng/mL — ABNORMAL HIGH (ref 0.00–5.00)

## 2016-07-11 LAB — UA PROTEIN, DIPSTICK - CHCC: PROTEIN: 30 mg/dL

## 2016-07-11 MED ORDER — PALONOSETRON HCL INJECTION 0.25 MG/5ML
INTRAVENOUS | Status: AC
  Filled 2016-07-11: qty 5

## 2016-07-11 MED ORDER — SODIUM CHLORIDE 0.9 % IV SOLN
Freq: Once | INTRAVENOUS | Status: AC
  Administered 2016-07-11: 12:00:00 via INTRAVENOUS

## 2016-07-11 MED ORDER — SODIUM CHLORIDE 0.9 % IV SOLN
2425.0000 mg/m2 | INTRAVENOUS | Status: DC
  Administered 2016-07-11: 5000 mg via INTRAVENOUS
  Filled 2016-07-11: qty 100

## 2016-07-11 MED ORDER — ATROPINE SULFATE 1 MG/ML IJ SOLN
INTRAMUSCULAR | Status: AC
  Filled 2016-07-11: qty 1

## 2016-07-11 MED ORDER — IRINOTECAN HCL CHEMO INJECTION 100 MG/5ML
125.0000 mg/m2 | Freq: Once | INTRAVENOUS | Status: AC
  Administered 2016-07-11: 260 mg via INTRAVENOUS
  Filled 2016-07-11: qty 8

## 2016-07-11 MED ORDER — DEXAMETHASONE SODIUM PHOSPHATE 10 MG/ML IJ SOLN
INTRAMUSCULAR | Status: AC
  Filled 2016-07-11: qty 1

## 2016-07-11 MED ORDER — HEPARIN SOD (PORK) LOCK FLUSH 100 UNIT/ML IV SOLN
500.0000 [IU] | Freq: Once | INTRAVENOUS | Status: DC | PRN
  Filled 2016-07-11: qty 5

## 2016-07-11 MED ORDER — PALONOSETRON HCL INJECTION 0.25 MG/5ML
0.2500 mg | Freq: Once | INTRAVENOUS | Status: AC
  Administered 2016-07-11: 0.25 mg via INTRAVENOUS

## 2016-07-11 MED ORDER — DEXAMETHASONE SODIUM PHOSPHATE 10 MG/ML IJ SOLN
10.0000 mg | Freq: Once | INTRAMUSCULAR | Status: AC
  Administered 2016-07-11: 10 mg via INTRAVENOUS

## 2016-07-11 MED ORDER — SODIUM CHLORIDE 0.9% FLUSH
10.0000 mL | INTRAVENOUS | Status: DC | PRN
  Administered 2016-07-11: 10 mL
  Filled 2016-07-11: qty 10

## 2016-07-11 MED ORDER — ATROPINE SULFATE 1 MG/ML IJ SOLN
0.5000 mg | Freq: Once | INTRAMUSCULAR | Status: AC | PRN
  Administered 2016-07-11: 0.5 mg via INTRAVENOUS

## 2016-07-11 MED ORDER — LIDOCAINE-PRILOCAINE 2.5-2.5 % EX CREA: 1.0000 "application " | TOPICAL_CREAM | CUTANEOUS | 1 refills | Status: DC | PRN

## 2016-07-11 MED ORDER — LEUCOVORIN CALCIUM INJECTION 350 MG
400.0000 mg/m2 | Freq: Once | INTRAVENOUS | Status: AC
  Administered 2016-07-11: 828 mg via INTRAVENOUS
  Filled 2016-07-11: qty 41.4

## 2016-07-11 MED ORDER — SODIUM CHLORIDE 0.9 % IV SOLN
400.0000 mg | Freq: Once | INTRAVENOUS | Status: AC
  Administered 2016-07-11: 400 mg via INTRAVENOUS
  Filled 2016-07-11: qty 16

## 2016-07-11 MED ORDER — SODIUM CHLORIDE 0.9 % IJ SOLN
10.0000 mL | INTRAMUSCULAR | Status: DC | PRN
  Administered 2016-07-11: 10 mL via INTRAVENOUS
  Filled 2016-07-11: qty 10

## 2016-07-11 NOTE — Telephone Encounter (Signed)
Per 1/23 LOS and staff message I have scheduled appts ans notified the scheduler

## 2016-07-11 NOTE — Progress Notes (Addendum)
[place] OFFICE PROGRESS NOTE   Diagnosis:  Colon cancer  INTERVAL HISTORY:   Judith Stephens returns as scheduled. Judith Stephens completed cycle 6 FOLFIRI/Avastin 06/27/2016. Judith Stephens denies nausea/vomiting. No mouth sores. No diarrhea. Stable mild dyspnea on exertion. No chest pain. Judith Stephens had a recent nosebleed. No other bleeding.  Objective:  Vital signs in last 24 hours:  Blood pressure 118/81, pulse 89, temperature 98.4 F (36.9 C), temperature source Oral, resp. rate 18, height '5\' 11"'$  (1.803 m), weight 177 lb 14.4 oz (80.7 kg), SpO2 98 %.    HEENT: No thrush or ulcers. Resp: Faint rales at both lung bases. No respiratory distress. Cardio: Regular rate and rhythm. GI: Abdomen is soft. Liver palpable mid upper abdomen. No apparent ascites. Vascular: No leg edema. Calves soft and nontender.  Port-A-Cath without erythema.  Lab Results:  Lab Results  Component Value Date   WBC 7.5 06/27/2016   HGB 9.9 (L) 06/27/2016   HCT 32.0 (L) 06/27/2016   MCV 90.7 06/27/2016   PLT 397 06/27/2016   NEUTROABS 4.1 06/27/2016    Imaging:  No results found.  Medications: I have reviewed the patient's current medications.  Assessment/Plan: 1. 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 Judith Stephens began adjuvant Xeloda on 05/14/2012. Final cycle of Xeloda started on 10/22/2012.  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 Mediastinal and upper abdominal lymphadenopathy appears similar. Stable ascites without definite signs of carcinomatosis. Small right pleural effusion.  Cycle 7 FOLFIRI/Avastin 07/11/2016 2. Severe microcytic anemia likely secondary to iron deficiency related to bleeding from the colon tumor. The hemoglobin and MCV improved on oral iron. Oral iron was discontinued following office visit 12/16/2012.  Recurrent microcytic anemia July 2017  Stool positive for occult blood 02/01/2016  Referred to GI 3. Diabetes. 4. Hypertension. 5. Hyperlipidemia. 6. History of hand/foot syndrome secondary to Xeloda prompting the Xeloda to be discontinued after day 12, cycle 2. Improved. Xeloda was dose reduced beginning with cycle 3. 7. History of mild diarrhea secondary to Xeloda.  8. Hypercalcemia-Zometa initiated 04/18/2016 9. History of eye tearing-most likely related to seasonal allergies though this could be a manifestation of Xeloda toxicity.  10. Status post root canal 10/02/2012. 11. Colonoscopy 03/13/2013 by Dr. Amedeo Plenty. Pathology on transverse colon polyp showed a tubular adenoma. Next colonoscopy recommendedat a three-year interval. 12. Anorexia/weight loss 13. Port-A-Cath placement by Dr. Dalbert Batman 01/31/2016 14. Early hand-foot syndrome secondary to 5-fluorouracil 15. 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 Judith Stephens has completed 6 cycles of FOLFIRI/Avastin. The CEA has improved as has Judith Stephens performance status. The recent restaging CT evaluation is stable to slightly worse. Dr. Benay Spice recommends continuation of FOLFIRI/Avastin. Judith Stephens is in agreement with this  plan. Plan to proceed with cycle 7 FOLFIRI/Avastin today as scheduled. Judith Stephens will return for a follow-up visit and cycle 8  in 2 weeks. Judith Stephens will contact the office in the interim with any problems.  Patient seen with Dr. Benay Spice. CT images were reviewed on the computer with Judith Stephens and Judith Stephens husband. 25 minutes were spent face-to-face at today's visit with the majority of that time involved in counseling/coordination of care.    Ned Card ANP/GNP-BC   07/11/2016  10:52 AM  This was a shared visit with Ned Card. We reviewed the CT images with Judith Stephens and Judith Stephens husband. Judith Stephens overall performance status has improved since beginning chemotherapy and the CEA has stabilized. The restaging CTs reveal slightly progressive disease compared to 3 months ago. Salvage treatment options are limited with Judith Stephens treatment history and the RAS mutation. I recommend continuing FOLFIRI/Avastin.  Julieanne Manson, M.D.

## 2016-07-11 NOTE — Telephone Encounter (Signed)
Message sent to chemo scheduler to be added per 07/11/16 los. Appointments scheduled per 07/11/16 los. Patient was given a copy of the appointment schedule and AVS report, per 07/11/16 los.

## 2016-07-11 NOTE — Patient Instructions (Addendum)
[facility] Discharge Instructions for Patients Receiving Chemotherapy  Today you received the following chemotherapy agents Avastin/5 FU/Leucovorin/Irinotecan To help prevent nausea and vomiting after your treatment, we encourage you to take your nausea medication as prescribed.   If you develop nausea and vomiting that is not controlled by your nausea medication, call the clinic.   BELOW ARE SYMPTOMS THAT SHOULD BE REPORTED IMMEDIATELY:  *FEVER GREATER THAN 100.5 F  *CHILLS WITH OR WITHOUT FEVER  NAUSEA AND VOMITING THAT IS NOT CONTROLLED WITH YOUR NAUSEA MEDICATION  *UNUSUAL SHORTNESS OF BREATH  *UNUSUAL BRUISING OR BLEEDING  TENDERNESS IN MOUTH AND THROAT WITH OR WITHOUT PRESENCE OF ULCERS  *URINARY PROBLEMS  *BOWEL PROBLEMS  UNUSUAL RASH Items with * indicate a potential emergency and should be followed up as soon as possible.  Feel free to call the clinic you have any questions or concerns. The clinic phone number is [phone].  Please show the CHEMO ALERT CARD at check-in to the Emergency Department and triage nurse.   

## 2016-07-13 ENCOUNTER — Ambulatory Visit (HOSPITAL_BASED_OUTPATIENT_CLINIC_OR_DEPARTMENT_OTHER): Payer: Medicare Other

## 2016-07-13 VITALS — BP 142/75 | HR 102 | Temp 99.0°F | Resp 18

## 2016-07-13 DIAGNOSIS — C182 Malignant neoplasm of ascending colon: Secondary | ICD-10-CM | POA: Diagnosis not present

## 2016-07-13 DIAGNOSIS — Z452 Encounter for adjustment and management of vascular access device: Secondary | ICD-10-CM

## 2016-07-13 MED ORDER — SODIUM CHLORIDE 0.9% FLUSH
10.0000 mL | INTRAVENOUS | Status: DC | PRN
  Administered 2016-07-13: 10 mL
  Filled 2016-07-13: qty 10

## 2016-07-13 MED ORDER — HEPARIN SOD (PORK) LOCK FLUSH 100 UNIT/ML IV SOLN
500.0000 [IU] | Freq: Once | INTRAVENOUS | Status: AC | PRN
  Administered 2016-07-13: 500 [IU]
  Filled 2016-07-13: qty 5

## 2016-07-13 NOTE — Patient Instructions (Signed)

## 2016-07-25 ENCOUNTER — Ambulatory Visit (HOSPITAL_BASED_OUTPATIENT_CLINIC_OR_DEPARTMENT_OTHER): Payer: Medicare Other

## 2016-07-25 ENCOUNTER — Ambulatory Visit (HOSPITAL_BASED_OUTPATIENT_CLINIC_OR_DEPARTMENT_OTHER): Payer: Medicare Other | Admitting: Nurse Practitioner

## 2016-07-25 ENCOUNTER — Other Ambulatory Visit (HOSPITAL_BASED_OUTPATIENT_CLINIC_OR_DEPARTMENT_OTHER): Payer: Medicare Other

## 2016-07-25 VITALS — BP 124/84 | HR 87 | Temp 98.6°F | Resp 18 | Ht 71.0 in | Wt 176.4 lb

## 2016-07-25 DIAGNOSIS — C787 Secondary malignant neoplasm of liver and intrahepatic bile duct: Secondary | ICD-10-CM | POA: Diagnosis not present

## 2016-07-25 DIAGNOSIS — I10 Essential (primary) hypertension: Secondary | ICD-10-CM

## 2016-07-25 DIAGNOSIS — C182 Malignant neoplasm of ascending colon: Secondary | ICD-10-CM

## 2016-07-25 DIAGNOSIS — Z5112 Encounter for antineoplastic immunotherapy: Secondary | ICD-10-CM | POA: Diagnosis not present

## 2016-07-25 DIAGNOSIS — Z95828 Presence of other vascular implants and grafts: Secondary | ICD-10-CM

## 2016-07-25 DIAGNOSIS — Z5111 Encounter for antineoplastic chemotherapy: Secondary | ICD-10-CM

## 2016-07-25 DIAGNOSIS — G620 Drug-induced polyneuropathy: Secondary | ICD-10-CM

## 2016-07-25 DIAGNOSIS — Z452 Encounter for adjustment and management of vascular access device: Secondary | ICD-10-CM

## 2016-07-25 DIAGNOSIS — C189 Malignant neoplasm of colon, unspecified: Secondary | ICD-10-CM

## 2016-07-25 DIAGNOSIS — E119 Type 2 diabetes mellitus without complications: Secondary | ICD-10-CM

## 2016-07-25 DIAGNOSIS — C7800 Secondary malignant neoplasm of unspecified lung: Secondary | ICD-10-CM

## 2016-07-25 LAB — CBC WITH DIFFERENTIAL/PLATELET
BASO%: 0.4 % (ref 0.0–2.0)
BASOS ABS: 0 10*3/uL (ref 0.0–0.1)
EOS ABS: 0.1 10*3/uL (ref 0.0–0.5)
EOS%: 1.4 % (ref 0.0–7.0)
HCT: 32.9 % — ABNORMAL LOW (ref 34.8–46.6)
HEMOGLOBIN: 10 g/dL — AB (ref 11.6–15.9)
LYMPH#: 1.6 10*3/uL (ref 0.9–3.3)
LYMPH%: 23 % (ref 14.0–49.7)
MCH: 28.7 pg (ref 25.1–34.0)
MCHC: 30.4 g/dL — ABNORMAL LOW (ref 31.5–36.0)
MCV: 94.3 fL (ref 79.5–101.0)
MONO#: 1.1 10*3/uL — AB (ref 0.1–0.9)
MONO%: 16 % — ABNORMAL HIGH (ref 0.0–14.0)
NEUT#: 4.2 10*3/uL (ref 1.5–6.5)
NEUT%: 59.2 % (ref 38.4–76.8)
PLATELETS: 340 10*3/uL (ref 145–400)
RBC: 3.49 10*6/uL — ABNORMAL LOW (ref 3.70–5.45)
RDW: 16.8 % — AB (ref 11.2–14.5)
WBC: 7.1 10*3/uL (ref 3.9–10.3)

## 2016-07-25 LAB — COMPREHENSIVE METABOLIC PANEL
ALBUMIN: 3.1 g/dL — AB (ref 3.5–5.0)
ALK PHOS: 211 U/L — AB (ref 40–150)
ALT: 15 U/L (ref 0–55)
ANION GAP: 8 meq/L (ref 3–11)
AST: 84 U/L — AB (ref 5–34)
BUN: 9 mg/dL (ref 7.0–26.0)
CALCIUM: 10.9 mg/dL — AB (ref 8.4–10.4)
CHLORIDE: 108 meq/L (ref 98–109)
CO2: 23 mEq/L (ref 22–29)
CREATININE: 0.7 mg/dL (ref 0.6–1.1)
EGFR: >90 mL/min/{1.73_m2} (ref >90)
Glucose: 85 mg/dl (ref 70–140)
POTASSIUM: 3.8 meq/L (ref 3.5–5.1)
Sodium: 140 mEq/L (ref 136–145)
Total Bilirubin: 0.5 mg/dL (ref 0.20–1.20)
Total Protein: 7.4 g/dL (ref 6.4–8.3)

## 2016-07-25 LAB — UA PROTEIN, DIPSTICK - CHCC: PROTEIN: 30 mg/dL

## 2016-07-25 LAB — CEA (IN HOUSE-CHCC): CEA (CHCC-IN HOUSE): 232.96 ng/mL — AB (ref 0.00–5.00)

## 2016-07-25 MED ORDER — SODIUM CHLORIDE 0.9 % IV SOLN
Freq: Once | INTRAVENOUS | Status: AC
  Administered 2016-07-25: 14:00:00 via INTRAVENOUS

## 2016-07-25 MED ORDER — SODIUM CHLORIDE 0.9 % IV SOLN
2425.0000 mg/m2 | INTRAVENOUS | Status: DC
  Administered 2016-07-25: 5000 mg via INTRAVENOUS
  Filled 2016-07-25: qty 100

## 2016-07-25 MED ORDER — PALONOSETRON HCL INJECTION 0.25 MG/5ML
INTRAVENOUS | Status: AC
  Filled 2016-07-25: qty 5

## 2016-07-25 MED ORDER — DEXAMETHASONE SODIUM PHOSPHATE 10 MG/ML IJ SOLN
10.0000 mg | Freq: Once | INTRAMUSCULAR | Status: AC
  Administered 2016-07-25: 10 mg via INTRAVENOUS

## 2016-07-25 MED ORDER — IRINOTECAN HCL CHEMO INJECTION 100 MG/5ML
125.0000 mg/m2 | Freq: Once | INTRAVENOUS | Status: AC
  Administered 2016-07-25: 260 mg via INTRAVENOUS
  Filled 2016-07-25: qty 5

## 2016-07-25 MED ORDER — DEXAMETHASONE SODIUM PHOSPHATE 10 MG/ML IJ SOLN
INTRAMUSCULAR | Status: AC
  Filled 2016-07-25: qty 1

## 2016-07-25 MED ORDER — SODIUM CHLORIDE 0.9 % IV SOLN
4.8000 mg/kg | Freq: Once | INTRAVENOUS | Status: AC
  Administered 2016-07-25: 400 mg via INTRAVENOUS
  Filled 2016-07-25: qty 16

## 2016-07-25 MED ORDER — PALONOSETRON HCL INJECTION 0.25 MG/5ML
0.2500 mg | Freq: Once | INTRAVENOUS | Status: AC
  Administered 2016-07-25: 0.25 mg via INTRAVENOUS

## 2016-07-25 MED ORDER — ATROPINE SULFATE 1 MG/ML IJ SOLN
0.5000 mg | Freq: Once | INTRAMUSCULAR | Status: AC | PRN
  Administered 2016-07-25: 0.5 mg via INTRAVENOUS

## 2016-07-25 MED ORDER — LEUCOVORIN CALCIUM INJECTION 350 MG
400.0000 mg/m2 | Freq: Once | INTRAVENOUS | Status: AC
  Administered 2016-07-25: 828 mg via INTRAVENOUS
  Filled 2016-07-25: qty 41.4

## 2016-07-25 MED ORDER — SODIUM CHLORIDE 0.9 % IJ SOLN
10.0000 mL | INTRAMUSCULAR | Status: DC | PRN
  Administered 2016-07-25: 10 mL via INTRAVENOUS
  Filled 2016-07-25: qty 10

## 2016-07-25 MED ORDER — ATROPINE SULFATE 1 MG/ML IJ SOLN
INTRAMUSCULAR | Status: AC
  Filled 2016-07-25: qty 1

## 2016-07-25 NOTE — Progress Notes (Signed)
[place] OFFICE PROGRESS NOTE   Diagnosis:  Colon cancer  INTERVAL HISTORY:   Judith Stephens returns as scheduled. She completed cycle 7 FOLFIRI/Avastin 07/11/2016. She denies nausea/vomiting. No mouth sores. No diarrhea. She continues to note blood with nose blowing. No other bleeding.  When she woke up this morning she was "dizzy". Her blood sugar was 55. She ate candy and the dizziness resolved. She reports taking Glucophage "10 mg" daily. This medication is not on her medication list.  Objective:  Vital signs in last 24 hours:  Blood pressure 124/84, pulse 87, temperature 98.6 F (37 C), temperature source Oral, resp. rate 18, height _0  (1.803 m), weight 176 lb 6.4 oz (80 kg), SpO2 97 %.    HEENT: No thrush or ulcers. Resp: Lungs clear bilaterally. Cardio: Regular rate and rhythm. GI: Liver palpable mid upper abdomen with associated tenderness. No apparent ascites. Vascular: No leg edema. Neuro: Alert and oriented.   Portacath without erythema.  Lab Results:  Lab Results  Component Value Date   WBC 7.1 07/25/2016   HGB 10.0 (L) 07/25/2016   HCT 32.9 (L) 07/25/2016   MCV 94.3 07/25/2016   PLT 340 07/25/2016   NEUTROABS 4.2 07/25/2016    Imaging:  No results found.  Medications: I have reviewed the patient's current medications.  Assessment/Plan: 1. 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 She began adjuvant Xeloda on 05/14/2012. Final cycle of Xeloda started on 10/22/2012.  CT abdomen/pelvis 01/10/2016-innumerable hepatic metastases  CT chest 01/12/2016-multiple pulmonary nodules consistent with metastases  Foundation 1-positive KRAS mutation; negative BRAF; microsatellite stable  PD-L1 negative  Cycle 1 FOLFOX 02/01/2016  Cycle 2 FOLFOX 02/15/2016  Cycle 3 FOLFOX 02/29/2016  Cycle 4 FOLFOX 03/14/2016  Cycle 5 FOLFOX 03/28/2016  CTs  04/13/2016-progressive lung and liver metastases  Cycle 1 FOLFIRI/Avastin 04/18/2016  Cycle 2 FOLFIRI/Avastin 05/02/2016  Cycle 3 FOLFIRI/Avastin 05/16/2016  Cycle 4 FOLFIRI/Avastin 05/30/2016  Cycle 5 FOLFIRI/Avastin 06/14/2016  Cycle 6 FOLFIRI/Avastin 06/27/2016  Restaging CT scans 07/07/2016-progressive widespread pulmonary and hepatic metastatic disease. Mediastinal and upper abdominal lymphadenopathy appears similar. Stable ascites without definite signs of carcinomatosis. Small right pleural effusion.  Cycle 7 FOLFIRI/Avastin 07/11/2016  Cycle 8 FOLFIRI/Avastin 07/25/2016 2. Severe microcytic anemia likely secondary to iron deficiency related to bleeding from the colon tumor. The hemoglobin and MCV improved on oral iron. Oral iron was discontinued following office visit 12/16/2012.  Recurrent microcytic anemia July 2017  Stool positive for occult blood 02/01/2016  Referred to GI 3. Diabetes. 4. Hypertension. 5. Hyperlipidemia. 6. History of hand/foot syndrome secondary to Xeloda prompting the Xeloda to be discontinued after day 12, cycle 2. Improved. Xeloda was dose reduced beginning with cycle 3. 7. History of mild diarrhea secondary to Xeloda.  8. Hypercalcemia-Zometa initiated 04/18/2016 9. History of eye tearing-most likely related to seasonal allergies though this could be a manifestation of Xeloda toxicity.  10. Status post root canal 10/02/2012. 11. Colonoscopy 03/13/2013 by Dr. Amedeo Plenty. Pathology on transverse colon polyp showed a tubular adenoma. Next colonoscopy recommendedat a three-year interval. 12. Anorexia/weight loss 13. Port-A-Cath placement by Dr. Dalbert Batman 01/31/2016 14. Early hand-foot syndrome secondary to 5-fluorouracil 15. 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 She has completed 7 cycles of FOLFIRI/Avastin. Plan to proceed  with cycle 8 today as scheduled.  She reports a low blood sugar this morning. She has  had multiple low readings in our office in the past. She is taking a diabetic medication at home, possibly Glucophage. She will check the bottle when she returns home and call us with updated information. We instructed her to discontinue the diabetic medication for now, check blood sugars twice daily, contact the office with readings greater than 200.  She will return for a follow-up visit and chemotherapy in 2 weeks. She will contact the office in the interim as outlined above or with any other problems.  Plan reviewed with Dr. Benay Spice.    Ned Card ANP/GNP-BC   07/25/2016  12:35 PM

## 2016-07-25 NOTE — Addendum Note (Signed)
Addended by: Betsy Coder B on: 07/25/2016 01:33 PM   Modules accepted: Orders

## 2016-07-25 NOTE — Patient Instructions (Addendum)
Return to the [facility] for pump disconnect on Thursday, 07/27/2016 @ 2:30 pm.  [facility] Discharge Instructions for Patients Receiving Chemotherapy  Today you received the following chemotherapy agents:  Avastin, Irinotecan, Leucovorin, and 5FU.  To help prevent nausea and vomiting after your treatment, we encourage you to take your nausea medication as directed.   If you develop nausea and vomiting that is not controlled by your nausea medication, call the clinic.   BELOW ARE SYMPTOMS THAT SHOULD BE REPORTED IMMEDIATELY:  *FEVER GREATER THAN 100.5 F  *CHILLS WITH OR WITHOUT FEVER  NAUSEA AND VOMITING THAT IS NOT CONTROLLED WITH YOUR NAUSEA MEDICATION  *UNUSUAL SHORTNESS OF BREATH  *UNUSUAL BRUISING OR BLEEDING  TENDERNESS IN MOUTH AND THROAT WITH OR WITHOUT PRESENCE OF ULCERS  *URINARY PROBLEMS  *BOWEL PROBLEMS  UNUSUAL RASH Items with * indicate a potential emergency and should be followed up as soon as possible.  Feel free to call the clinic you have any questions or concerns. The clinic phone number is [phone].  Please show the [place] at check-in to the Emergency Department and triage nurse.

## 2016-07-27 ENCOUNTER — Ambulatory Visit (HOSPITAL_BASED_OUTPATIENT_CLINIC_OR_DEPARTMENT_OTHER): Payer: Medicare Other

## 2016-07-27 VITALS — BP 148/78 | HR 98 | Temp 99.7°F | Resp 18

## 2016-07-27 DIAGNOSIS — C182 Malignant neoplasm of ascending colon: Secondary | ICD-10-CM

## 2016-07-27 MED ORDER — SODIUM CHLORIDE 0.9% FLUSH
10.0000 mL | INTRAVENOUS | Status: DC | PRN
  Administered 2016-07-27: 10 mL
  Filled 2016-07-27: qty 10

## 2016-07-27 MED ORDER — SODIUM CHLORIDE 0.9 % IV SOLN: 10.0000 mg | Freq: Once | INTRAVENOUS | Status: DC

## 2016-07-27 MED ORDER — DEXAMETHASONE SODIUM PHOSPHATE 10 MG/ML IJ SOLN
INTRAMUSCULAR | Status: AC
  Filled 2016-07-27: qty 1

## 2016-07-27 MED ORDER — SODIUM CHLORIDE 0.9 % IV SOLN
Freq: Once | INTRAVENOUS | Status: AC
  Administered 2016-07-27: 15:00:00 via INTRAVENOUS

## 2016-07-27 MED ORDER — DEXAMETHASONE SODIUM PHOSPHATE 10 MG/ML IJ SOLN
10.0000 mg | Freq: Once | INTRAMUSCULAR | Status: AC
  Administered 2016-07-27: 10 mg via INTRAVENOUS

## 2016-07-27 MED ORDER — HEPARIN SOD (PORK) LOCK FLUSH 100 UNIT/ML IV SOLN
500.0000 [IU] | Freq: Once | INTRAVENOUS | Status: AC | PRN
  Administered 2016-07-27: 500 [IU]
  Filled 2016-07-27: qty 5

## 2016-07-27 NOTE — Progress Notes (Signed)
Reconsulted Dr. Benay Spice regarding patient's nausea and vomiting. Instructed to give 560ml of NS over 1.5hours and 10 of IV decadron.   Wylene Simmer, BSN, RN 07/27/2016 3:07 PM

## 2016-07-27 NOTE — Progress Notes (Signed)
Patient reports increased nausea and vomiting with this pump connect. Took zofran last pm with no effect. Patient hasn't taken anything today. Called Selena Lesser, NP and she recommended that the patient take Ativan to see if it helps her nausea. If no relief, call clinic for possible fluid scheduling tomorrow.   Wylene Simmer, BSN, RN 07/27/2016 2:46 PM

## 2016-07-27 NOTE — Patient Instructions (Signed)
**Take Ativan for nausea to see if it helps relieve symptoms**    Dehydration, Adult Dehydration is when there is not enough fluid or water in your body. This happens when you lose more fluids than you take in. Dehydration can range from mild to very bad. It should be treated right away to keep it from getting very bad. Symptoms of mild dehydration may include:  Thirst.  Dry lips.  Slightly dry mouth.  Dry, warm skin.  Dizziness. Symptoms of moderate dehydration may include:  Very dry mouth.  Muscle cramps.  Dark pee (urine). Pee may be the color of tea.  Your body making less pee.  Your eyes making fewer tears.  Heartbeat that is uneven or faster than normal (palpitations).  Headache.  Light-headedness, especially when you stand up from sitting.  Fainting (syncope). Symptoms of very bad dehydration may include:  Changes in skin, such as:  Cold and clammy skin.  Blotchy (mottled) or pale skin.  Skin that does not quickly return to normal after being lightly pinched and let go (poor skin turgor).  Changes in body fluids, such as:  Feeling very thirsty.  Your eyes making fewer tears.  Not sweating when body temperature is high, such as in hot weather.  Your body making very little pee.  Changes in vital signs, such as:  Weak pulse.  Pulse that is more than 100 beats a minute when you are sitting still.  Fast breathing.  Low blood pressure.  Other changes, such as:  Sunken eyes.  Cold hands and feet.  Confusion.  Lack of energy (lethargy).  Trouble waking up from sleep.  Short-term weight loss.  Unconsciousness. Follow these instructions at home:  If told by your doctor, drink an ORS:  Make an ORS by using instructions on the package.  Start by drinking small amounts, about  cup (120 mL) every 5-10 minutes.  Slowly drink more until you have had the amount that your doctor said to have.  Drink enough clear fluid to keep your pee  clear or pale yellow. If you were told to drink an ORS, finish the ORS first, then start slowly drinking clear fluids. Drink fluids such as:  Water. Do not drink only water by itself. Doing that can make the salt (sodium) level in your body get too low (hyponatremia).  Ice chips.  Fruit juice that you have added water to (diluted).  Low-calorie sports drinks.  Avoid:  Alcohol.  Drinks that have a lot of sugar. These include high-calorie sports drinks, fruit juice that does not have water added, and soda.  Caffeine.  Foods that are greasy or have a lot of fat or sugar.  Take over-the-counter and prescription medicines only as told by your doctor.  Do not take salt tablets. Doing that can make the salt level in your body get too high (hypernatremia).  Eat foods that have minerals (electrolytes). Examples include bananas, oranges, potatoes, tomatoes, and spinach.  Keep all follow-up visits as told by your doctor. This is important. Contact a doctor if:  You have belly (abdominal) pain that:  Gets worse.  Stays in one area (localizes).  You have a rash.  You have a stiff neck.  You get angry or annoyed more easily than normal (irritability).  You are more sleepy than normal.  You have a harder time waking up than normal.  You feel:  Weak.  Dizzy.  Very thirsty.  You have peed (urinated) only a small amount of very dark  pee during 6-8 hours. Get help right away if:  You have symptoms of very bad dehydration.  You cannot drink fluids without throwing up (vomiting).  Your symptoms get worse with treatment.  You have a fever.  You have a very bad headache.  You are throwing up or having watery poop (diarrhea) and it:  Gets worse.  Does not go away.  You have blood or something green (bile) in your throw-up.  You have blood in your poop (stool). This may cause poop to look black and tarry.  You have not peed in 6-8 hours.  You pass out  (faint).  Your heart rate when you are sitting still is more than 100 beats a minute.  You have trouble breathing. This information is not intended to replace advice given to you by your health care provider. Make sure you discuss any questions you have with your health care provider. Document Released: 04/01/2009 Document Revised: 12/24/2015 Document Reviewed: 07/30/2015 Elsevier Interactive Patient Education  2017 [employer].

## 2016-07-29 ENCOUNTER — Telehealth: Payer: Self-pay | Admitting: Nurse Practitioner

## 2016-07-29 NOTE — Telephone Encounter (Signed)
Appointments scheduled per 2/6 LOS. Patient notified. °

## 2016-08-02 ENCOUNTER — Telehealth: Payer: Self-pay | Admitting: *Deleted

## 2016-08-02 NOTE — Telephone Encounter (Signed)
Per 2/6 LOS and staff message I have scheduled appt. Notified the scheduleer

## 2016-08-06 ENCOUNTER — Other Ambulatory Visit: Payer: Self-pay | Admitting: Nurse Practitioner

## 2016-08-08 ENCOUNTER — Ambulatory Visit (HOSPITAL_BASED_OUTPATIENT_CLINIC_OR_DEPARTMENT_OTHER): Payer: Medicare Other

## 2016-08-08 ENCOUNTER — Other Ambulatory Visit (HOSPITAL_BASED_OUTPATIENT_CLINIC_OR_DEPARTMENT_OTHER): Payer: Medicare Other

## 2016-08-08 ENCOUNTER — Telehealth: Payer: Self-pay | Admitting: *Deleted

## 2016-08-08 ENCOUNTER — Ambulatory Visit (HOSPITAL_BASED_OUTPATIENT_CLINIC_OR_DEPARTMENT_OTHER): Payer: Medicare Other | Admitting: Oncology

## 2016-08-08 ENCOUNTER — Telehealth: Payer: Self-pay | Admitting: Oncology

## 2016-08-08 VITALS — BP 120/74 | HR 96 | Temp 98.6°F | Resp 18 | Ht 71.0 in | Wt 176.2 lb

## 2016-08-08 DIAGNOSIS — C182 Malignant neoplasm of ascending colon: Secondary | ICD-10-CM

## 2016-08-08 DIAGNOSIS — E119 Type 2 diabetes mellitus without complications: Secondary | ICD-10-CM

## 2016-08-08 DIAGNOSIS — D509 Iron deficiency anemia, unspecified: Secondary | ICD-10-CM | POA: Diagnosis not present

## 2016-08-08 DIAGNOSIS — C7800 Secondary malignant neoplasm of unspecified lung: Secondary | ICD-10-CM

## 2016-08-08 DIAGNOSIS — G620 Drug-induced polyneuropathy: Secondary | ICD-10-CM

## 2016-08-08 DIAGNOSIS — C787 Secondary malignant neoplasm of liver and intrahepatic bile duct: Secondary | ICD-10-CM

## 2016-08-08 DIAGNOSIS — I10 Essential (primary) hypertension: Secondary | ICD-10-CM | POA: Diagnosis not present

## 2016-08-08 DIAGNOSIS — Z95828 Presence of other vascular implants and grafts: Secondary | ICD-10-CM

## 2016-08-08 DIAGNOSIS — Z5112 Encounter for antineoplastic immunotherapy: Secondary | ICD-10-CM

## 2016-08-08 DIAGNOSIS — Z452 Encounter for adjustment and management of vascular access device: Secondary | ICD-10-CM | POA: Diagnosis not present

## 2016-08-08 DIAGNOSIS — Z5111 Encounter for antineoplastic chemotherapy: Secondary | ICD-10-CM

## 2016-08-08 LAB — CBC WITH DIFFERENTIAL/PLATELET
BASO%: 1 % (ref 0.0–2.0)
Basophils Absolute: 0.1 10*3/uL (ref 0.0–0.1)
EOS ABS: 0.1 10*3/uL (ref 0.0–0.5)
EOS%: 1.3 % (ref 0.0–7.0)
HCT: 31 % — ABNORMAL LOW (ref 34.8–46.6)
HEMOGLOBIN: 10.2 g/dL — AB (ref 11.6–15.9)
LYMPH%: 28.4 % (ref 14.0–49.7)
MCH: 29.6 pg (ref 25.1–34.0)
MCHC: 32.8 g/dL (ref 31.5–36.0)
MCV: 90.2 fL (ref 79.5–101.0)
MONO#: 0.9 10*3/uL (ref 0.1–0.9)
MONO%: 13.5 % (ref 0.0–14.0)
NEUT%: 55.8 % (ref 38.4–76.8)
NEUTROS ABS: 3.7 10*3/uL (ref 1.5–6.5)
Platelets: 350 10*3/uL (ref 145–400)
RBC: 3.44 10*6/uL — ABNORMAL LOW (ref 3.70–5.45)
RDW: 15.9 % — AB (ref 11.2–14.5)
WBC: 6.6 10*3/uL (ref 3.9–10.3)
lymph#: 1.9 10*3/uL (ref 0.9–3.3)

## 2016-08-08 LAB — COMPREHENSIVE METABOLIC PANEL
ALT: 14 U/L (ref 0–55)
AST: 80 U/L — ABNORMAL HIGH (ref 5–34)
Albumin: 3 g/dL — ABNORMAL LOW (ref 3.5–5.0)
Alkaline Phosphatase: 194 U/L — ABNORMAL HIGH (ref 40–150)
Anion Gap: 10 mEq/L (ref 3–11)
BUN: 12.2 mg/dL (ref 7.0–26.0)
CO2: 22 mEq/L (ref 22–29)
Calcium: 10.9 mg/dL — ABNORMAL HIGH (ref 8.4–10.4)
Chloride: 109 mEq/L (ref 98–109)
Creatinine: 0.8 mg/dL (ref 0.6–1.1)
EGFR: >90 mL/min/{1.73_m2} (ref >90)
Glucose: 65 mg/dl — ABNORMAL LOW (ref 70–140)
Potassium: 3.9 mEq/L (ref 3.5–5.1)
Sodium: 141 mEq/L (ref 136–145)
Total Bilirubin: 0.47 mg/dL (ref 0.20–1.20)
Total Protein: 7.3 g/dL (ref 6.4–8.3)

## 2016-08-08 LAB — UA PROTEIN, DIPSTICK - CHCC: Protein, ur: <30 mg/dL

## 2016-08-08 LAB — CEA (IN HOUSE-CHCC): CEA (CHCC-In House): 274.39 ng/mL — ABNORMAL HIGH (ref 0.00–5.00)

## 2016-08-08 MED ORDER — SODIUM CHLORIDE 0.9 % IV SOLN
400.0000 mg | Freq: Once | INTRAVENOUS | Status: AC
  Administered 2016-08-08: 400 mg via INTRAVENOUS
  Filled 2016-08-08: qty 16

## 2016-08-08 MED ORDER — SODIUM CHLORIDE 0.9 % IJ SOLN
10.0000 mL | INTRAMUSCULAR | Status: DC | PRN
  Administered 2016-08-08: 10 mL via INTRAVENOUS
  Filled 2016-08-08: qty 10

## 2016-08-08 MED ORDER — ATROPINE SULFATE 1 MG/ML IJ SOLN
0.5000 mg | Freq: Once | INTRAMUSCULAR | Status: AC | PRN
  Administered 2016-08-08: 0.5 mg via INTRAVENOUS

## 2016-08-08 MED ORDER — PALONOSETRON HCL INJECTION 0.25 MG/5ML
0.2500 mg | Freq: Once | INTRAVENOUS | Status: AC
  Administered 2016-08-08: 0.25 mg via INTRAVENOUS

## 2016-08-08 MED ORDER — SODIUM CHLORIDE 0.9 % IV SOLN
Freq: Once | INTRAVENOUS | Status: AC
  Administered 2016-08-08: 12:00:00 via INTRAVENOUS
  Filled 2016-08-08: qty 5

## 2016-08-08 MED ORDER — PALONOSETRON HCL INJECTION 0.25 MG/5ML
INTRAVENOUS | Status: AC
  Filled 2016-08-08: qty 5

## 2016-08-08 MED ORDER — ATROPINE SULFATE 1 MG/ML IJ SOLN
INTRAMUSCULAR | Status: AC
  Filled 2016-08-08: qty 1

## 2016-08-08 MED ORDER — SODIUM CHLORIDE 0.9 % IV SOLN
Freq: Once | INTRAVENOUS | Status: AC
  Administered 2016-08-08: 12:00:00 via INTRAVENOUS

## 2016-08-08 MED ORDER — IRINOTECAN HCL CHEMO INJECTION 100 MG/5ML
125.0000 mg/m2 | Freq: Once | INTRAVENOUS | Status: AC
  Administered 2016-08-08: 260 mg via INTRAVENOUS
  Filled 2016-08-08: qty 5

## 2016-08-08 MED ORDER — LEUCOVORIN CALCIUM INJECTION 350 MG
400.0000 mg/m2 | Freq: Once | INTRAVENOUS | Status: AC
  Administered 2016-08-08: 828 mg via INTRAVENOUS
  Filled 2016-08-08: qty 41.4

## 2016-08-08 MED ORDER — SODIUM CHLORIDE 0.9 % IV SOLN
2410.0000 mg/m2 | INTRAVENOUS | Status: DC
  Administered 2016-08-08: 5000 mg via INTRAVENOUS
  Filled 2016-08-08: qty 100

## 2016-08-08 NOTE — Telephone Encounter (Signed)
Per 2/20 LOS and staff message I have scheduled appts. I have given calendar and notiifed the scheduler

## 2016-08-08 NOTE — Telephone Encounter (Signed)
Appointments scheduled per 2/20 LOS. Patient given AVS report and calendars with future scheduled appointments. °

## 2016-08-08 NOTE — Progress Notes (Signed)
[place] OFFICE PROGRESS NOTE   Diagnosis: Colon cancer  INTERVAL HISTORY:   Judith Stephens returns as scheduled. She completed another cycle of FOLFIRI/Avastin on 07/25/2016. She had an episode of emesis on the evening of 07/26/2016. No mouth sores or diarrhea. Good appetite. She resumed glipizide and reports the lowest blood sugar reading has been in the 90s. She has mild bleeding when she blows her nose.  Objective:  Vital signs in last 24 hours:  Blood pressure 120/74, pulse 96, temperature 98.6 F (37 C), temperature source Oral, resp. rate 18, height 5' 11" (1.803 m), weight 176 lb 3.2 oz (79.9 kg), SpO2 100 %.    HEENT: No thrush or ulcers Resp: Decreased breath sounds with inspiratory rales at the right base, no respiratory distress Cardio: Regular rate and rhythm GI: The liver edge is palpable in the mid upper abdomen, no splenomegaly Vascular: No leg edema    Portacath/PICC-without erythema  Lab Results:  Lab Results  Component Value Date   WBC 6.6 08/08/2016   HGB 10.2 (L) 08/08/2016   HCT 31.0 (L) 08/08/2016   MCV 90.2 08/08/2016   PLT 350 08/08/2016   NEUTROABS 3.7 08/08/2016  Urine protein on 07/25/2016-30  CEA on 07/25/2016-232.96  Medications: I have reviewed the patient's current medications.            Assessment/plan: 1. 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 She began adjuvant Xeloda on 05/14/2012. Final cycle of Xeloda started on 10/22/2012.  CT abdomen/pelvis 01/10/2016-innumerable hepatic metastases  CT chest 01/12/2016-multiple pulmonary nodules consistent with metastases  Foundation 1-positive KRASmutation; negative BRAF; microsatellite stable  PD-L1 negative  Cycle 1 FOLFOX 02/01/2016  Cycle 2 FOLFOX 02/15/2016  Cycle 3 FOLFOX 02/29/2016  Cycle 4 FOLFOX 03/14/2016  Cycle 5 FOLFOX 03/28/2016  CTs 04/13/2016-progressive lung and  liver metastases  Cycle 1 FOLFIRI/Avastin 04/18/2016  Cycle 2 FOLFIRI/Avastin 05/02/2016  Cycle 3 FOLFIRI/Avastin 05/16/2016  Cycle 4 FOLFIRI/Avastin 05/30/2016  Cycle 5 FOLFIRI/Avastin 06/14/2016  Cycle 6 FOLFIRI/Avastin 06/27/2016  Restaging CT scans 07/07/2016-progressive widespread pulmonary and hepatic metastatic disease. Mediastinal and upper abdominal lymphadenopathy appears similar. Stable ascites without definite signs of carcinomatosis. Small right pleural effusion.  Cycle 7 FOLFIRI/Avastin 07/11/2016  Cycle 8 FOLFIRI/Avastin 07/25/2016  Cycle 9 FOLFIRI/Avastin 08/08/2016 2. Severe microcytic anemia likely secondary to iron deficiency related to bleeding from the colon tumor. The hemoglobin and MCV improved on oral iron. Oral iron was discontinued following office visit 12/16/2012.  Recurrent microcytic anemia July 2017  Stool positive for occult blood 02/01/2016  Referred to GI 3. Diabetes. 4. Hypertension. 5. Hyperlipidemia. 6. History of hand/foot syndrome secondary to Xeloda prompting the Xeloda to be discontinued after day 12, cycle 2. Improved. Xeloda was dose reduced beginning with cycle 3. 7. History of mild diarrhea secondary to Xeloda.  8. Hypercalcemia-Zometa initiated 04/18/2016 9. History of eye tearing-most likely related to seasonal allergies though this could be a manifestation of Xeloda toxicity.  10. Status post root canal 10/02/2012. 11. Colonoscopy 03/13/2013 by Dr. Amedeo Plenty. Pathology on transverse colon polyp showed a tubular adenoma. Next colonoscopy recommendedat a three-year interval. 12. Anorexia/weight loss 13. Port-A-Cath placement by Dr. Dalbert Batman 01/31/2016 14. Early hand-foot syndrome secondary to 5-fluorouracil 15. 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 The CEA was higher when she was here 2 weeks ago. She has  completed only 2 cycles of FOLFIRI/Avastin since  the restaging CT in January. She will complete at least 2 more cycles of chemotherapy prior to a restaging evaluation. Her performance status is stable. I will add Emend for delayed nausea.  25 minutes were spent with the patient today. The majority of the time was used for counseling and coordination of care.    Betsy Coder, MD  08/08/2016  10:13 AM

## 2016-08-08 NOTE — Patient Instructions (Signed)
[facility] Discharge Instructions for Patients Receiving Chemotherapy  Today you received the following chemotherapy agents: Avastin, Irinotecan, Leucovorin, Fluorouracil  To help prevent nausea and vomiting after your treatment, we encourage you to take your nausea medication as prescribed.   If you develop nausea and vomiting that is not controlled by your nausea medication, call the clinic.   BELOW ARE SYMPTOMS THAT SHOULD BE REPORTED IMMEDIATELY:  *FEVER GREATER THAN 100.5 F  *CHILLS WITH OR WITHOUT FEVER  NAUSEA AND VOMITING THAT IS NOT CONTROLLED WITH YOUR NAUSEA MEDICATION  *UNUSUAL SHORTNESS OF BREATH  *UNUSUAL BRUISING OR BLEEDING  TENDERNESS IN MOUTH AND THROAT WITH OR WITHOUT PRESENCE OF ULCERS  *URINARY PROBLEMS  *BOWEL PROBLEMS  UNUSUAL RASH Items with * indicate a potential emergency and should be followed up as soon as possible.  Feel free to call the clinic you have any questions or concerns. The clinic phone number is [phone].  Please show the CHEMO ALERT CARD at check-in to the Emergency Department and triage nurse.   

## 2016-08-10 ENCOUNTER — Ambulatory Visit (HOSPITAL_BASED_OUTPATIENT_CLINIC_OR_DEPARTMENT_OTHER): Payer: Medicare Other

## 2016-08-10 VITALS — BP 142/82 | HR 104 | Temp 99.3°F | Resp 18

## 2016-08-10 DIAGNOSIS — Z452 Encounter for adjustment and management of vascular access device: Secondary | ICD-10-CM

## 2016-08-10 DIAGNOSIS — Z95828 Presence of other vascular implants and grafts: Secondary | ICD-10-CM

## 2016-08-10 DIAGNOSIS — C182 Malignant neoplasm of ascending colon: Secondary | ICD-10-CM | POA: Diagnosis not present

## 2016-08-10 DIAGNOSIS — C787 Secondary malignant neoplasm of liver and intrahepatic bile duct: Secondary | ICD-10-CM

## 2016-08-10 MED ORDER — HEPARIN SOD (PORK) LOCK FLUSH 100 UNIT/ML IV SOLN
500.0000 [IU] | Freq: Once | INTRAVENOUS | Status: AC | PRN
  Administered 2016-08-10: 500 [IU]
  Filled 2016-08-10: qty 5

## 2016-08-10 MED ORDER — SODIUM CHLORIDE 0.9 % IJ SOLN
10.0000 mL | INTRAMUSCULAR | Status: DC | PRN
  Filled 2016-08-10: qty 10

## 2016-08-10 MED ORDER — SODIUM CHLORIDE 0.9% FLUSH
10.0000 mL | INTRAVENOUS | Status: DC | PRN
  Administered 2016-08-10: 10 mL
  Filled 2016-08-10: qty 10

## 2016-08-10 MED ORDER — HEPARIN SOD (PORK) LOCK FLUSH 100 UNIT/ML IV SOLN
500.0000 [IU] | Freq: Once | INTRAVENOUS | Status: DC | PRN
  Filled 2016-08-10: qty 5

## 2016-08-10 NOTE — Patient Instructions (Signed)

## 2016-08-15 ENCOUNTER — Telehealth: Payer: Self-pay | Admitting: *Deleted

## 2016-08-15 NOTE — Telephone Encounter (Signed)
Call from patient reporting feeling sore in the mid abdomen. Unable to rate. "It's not pain, it is sore." Bowels are moving. She is able to eat and drink. Pain med is helping. Asks "is this OK?" Reviewed with Dr. Benay Spice: Continue pain med, call for worsening pain.

## 2016-08-17 DIAGNOSIS — E039 Hypothyroidism, unspecified: Secondary | ICD-10-CM | POA: Diagnosis not present

## 2016-08-17 DIAGNOSIS — E119 Type 2 diabetes mellitus without complications: Secondary | ICD-10-CM | POA: Diagnosis not present

## 2016-08-17 DIAGNOSIS — I10 Essential (primary) hypertension: Secondary | ICD-10-CM | POA: Diagnosis not present

## 2016-08-17 DIAGNOSIS — E7800 Pure hypercholesterolemia, unspecified: Secondary | ICD-10-CM | POA: Diagnosis not present

## 2016-08-17 DIAGNOSIS — J309 Allergic rhinitis, unspecified: Secondary | ICD-10-CM | POA: Diagnosis not present

## 2016-08-22 ENCOUNTER — Ambulatory Visit (HOSPITAL_BASED_OUTPATIENT_CLINIC_OR_DEPARTMENT_OTHER): Payer: Medicare Other | Admitting: Oncology

## 2016-08-22 ENCOUNTER — Other Ambulatory Visit (HOSPITAL_BASED_OUTPATIENT_CLINIC_OR_DEPARTMENT_OTHER): Payer: Medicare Other

## 2016-08-22 ENCOUNTER — Ambulatory Visit (HOSPITAL_BASED_OUTPATIENT_CLINIC_OR_DEPARTMENT_OTHER): Payer: Medicare Other

## 2016-08-22 ENCOUNTER — Telehealth: Payer: Self-pay | Admitting: Oncology

## 2016-08-22 VITALS — BP 125/77 | HR 102 | Temp 98.4°F | Resp 18 | Ht 71.0 in | Wt 174.9 lb

## 2016-08-22 DIAGNOSIS — D509 Iron deficiency anemia, unspecified: Secondary | ICD-10-CM | POA: Diagnosis not present

## 2016-08-22 DIAGNOSIS — G620 Drug-induced polyneuropathy: Secondary | ICD-10-CM | POA: Diagnosis not present

## 2016-08-22 DIAGNOSIS — Z452 Encounter for adjustment and management of vascular access device: Secondary | ICD-10-CM

## 2016-08-22 DIAGNOSIS — C7800 Secondary malignant neoplasm of unspecified lung: Secondary | ICD-10-CM | POA: Diagnosis not present

## 2016-08-22 DIAGNOSIS — C182 Malignant neoplasm of ascending colon: Secondary | ICD-10-CM

## 2016-08-22 DIAGNOSIS — Z5112 Encounter for antineoplastic immunotherapy: Secondary | ICD-10-CM

## 2016-08-22 DIAGNOSIS — E119 Type 2 diabetes mellitus without complications: Secondary | ICD-10-CM | POA: Diagnosis not present

## 2016-08-22 DIAGNOSIS — C787 Secondary malignant neoplasm of liver and intrahepatic bile duct: Secondary | ICD-10-CM

## 2016-08-22 DIAGNOSIS — Z95828 Presence of other vascular implants and grafts: Secondary | ICD-10-CM

## 2016-08-22 DIAGNOSIS — Z5111 Encounter for antineoplastic chemotherapy: Secondary | ICD-10-CM

## 2016-08-22 DIAGNOSIS — I10 Essential (primary) hypertension: Secondary | ICD-10-CM

## 2016-08-22 LAB — CBC WITH DIFFERENTIAL/PLATELET
BASO%: 0.9 % (ref 0.0–2.0)
Basophils Absolute: 0.1 10*3/uL (ref 0.0–0.1)
EOS ABS: 0.1 10*3/uL (ref 0.0–0.5)
EOS%: 0.7 % (ref 0.0–7.0)
HCT: 31.7 % — ABNORMAL LOW (ref 34.8–46.6)
HGB: 10.5 g/dL — ABNORMAL LOW (ref 11.6–15.9)
LYMPH%: 27.5 % (ref 14.0–49.7)
MCH: 29.9 pg (ref 25.1–34.0)
MCHC: 33.2 g/dL (ref 31.5–36.0)
MCV: 90.3 fL (ref 79.5–101.0)
MONO#: 0.8 10*3/uL (ref 0.1–0.9)
MONO%: 12 % (ref 0.0–14.0)
NEUT%: 58.9 % (ref 38.4–76.8)
NEUTROS ABS: 4.1 10*3/uL (ref 1.5–6.5)
Platelets: 439 10*3/uL — ABNORMAL HIGH (ref 145–400)
RBC: 3.51 10*6/uL — AB (ref 3.70–5.45)
RDW: 16.6 % — ABNORMAL HIGH (ref 11.2–14.5)
WBC: 7 10*3/uL (ref 3.9–10.3)
lymph#: 1.9 10*3/uL (ref 0.9–3.3)

## 2016-08-22 LAB — COMPREHENSIVE METABOLIC PANEL
ALT: 13 U/L (ref 0–55)
AST: 73 U/L — ABNORMAL HIGH (ref 5–34)
Albumin: 2.9 g/dL — ABNORMAL LOW (ref 3.5–5.0)
Alkaline Phosphatase: 203 U/L — ABNORMAL HIGH (ref 40–150)
Anion Gap: 7 mEq/L (ref 3–11)
BUN: 10.6 mg/dL (ref 7.0–26.0)
CO2: 23 meq/L (ref 22–29)
Calcium: 10.5 mg/dL — ABNORMAL HIGH (ref 8.4–10.4)
Chloride: 111 mEq/L — ABNORMAL HIGH (ref 98–109)
Creatinine: 0.7 mg/dL (ref 0.6–1.1)
GLUCOSE: 71 mg/dL (ref 70–140)
POTASSIUM: 4.1 meq/L (ref 3.5–5.1)
SODIUM: 141 meq/L (ref 136–145)
TOTAL PROTEIN: 7.3 g/dL (ref 6.4–8.3)
Total Bilirubin: 0.48 mg/dL (ref 0.20–1.20)

## 2016-08-22 LAB — CEA (IN HOUSE-CHCC): CEA (CHCC-In House): 242.96 ng/mL — ABNORMAL HIGH (ref 0.00–5.00)

## 2016-08-22 LAB — UA PROTEIN, DIPSTICK - CHCC: Protein, ur: <30 mg/dL

## 2016-08-22 MED ORDER — PALONOSETRON HCL INJECTION 0.25 MG/5ML
0.2500 mg | Freq: Once | INTRAVENOUS | Status: AC
  Administered 2016-08-22: 0.25 mg via INTRAVENOUS

## 2016-08-22 MED ORDER — ATROPINE SULFATE 1 MG/ML IJ SOLN
INTRAMUSCULAR | Status: AC
  Filled 2016-08-22: qty 1

## 2016-08-22 MED ORDER — PALONOSETRON HCL INJECTION 0.25 MG/5ML
INTRAVENOUS | Status: AC
  Filled 2016-08-22: qty 5

## 2016-08-22 MED ORDER — ATROPINE SULFATE 1 MG/ML IJ SOLN
0.5000 mg | Freq: Once | INTRAMUSCULAR | Status: AC | PRN
  Administered 2016-08-22: 0.5 mg via INTRAVENOUS

## 2016-08-22 MED ORDER — SODIUM CHLORIDE 0.9 % IV SOLN
4.8000 mg/kg | Freq: Once | INTRAVENOUS | Status: AC
  Administered 2016-08-22: 400 mg via INTRAVENOUS
  Filled 2016-08-22: qty 16

## 2016-08-22 MED ORDER — SODIUM CHLORIDE 0.9 % IV SOLN
2405.0000 mg/m2 | INTRAVENOUS | Status: DC
  Administered 2016-08-22: 5000 mg via INTRAVENOUS
  Filled 2016-08-22: qty 100

## 2016-08-22 MED ORDER — IRINOTECAN HCL CHEMO INJECTION 100 MG/5ML
125.0000 mg/m2 | Freq: Once | INTRAVENOUS | Status: AC
  Administered 2016-08-22: 260 mg via INTRAVENOUS
  Filled 2016-08-22: qty 5

## 2016-08-22 MED ORDER — LEUCOVORIN CALCIUM INJECTION 350 MG
400.0000 mg/m2 | Freq: Once | INTRAVENOUS | Status: AC
  Administered 2016-08-22: 828 mg via INTRAVENOUS
  Filled 2016-08-22: qty 41.4

## 2016-08-22 MED ORDER — SODIUM CHLORIDE 0.9 % IV SOLN
Freq: Once | INTRAVENOUS | Status: AC
  Administered 2016-08-22: 11:00:00 via INTRAVENOUS

## 2016-08-22 MED ORDER — SODIUM CHLORIDE 0.9 % IV SOLN
Freq: Once | INTRAVENOUS | Status: AC
  Administered 2016-08-22: 11:00:00 via INTRAVENOUS
  Filled 2016-08-22: qty 5

## 2016-08-22 MED ORDER — SODIUM CHLORIDE 0.9 % IJ SOLN
10.0000 mL | INTRAMUSCULAR | Status: DC | PRN
  Administered 2016-08-22: 10 mL via INTRAVENOUS
  Filled 2016-08-22: qty 10

## 2016-08-22 NOTE — Progress Notes (Signed)
[place] OFFICE PROGRESS NOTE   Diagnosis: Colon cancer  INTERVAL HISTORY:   Judith Stephens returns as scheduled. She completed another cycle of FOLFIRI/Avastin beginning 08/08/2016. She had nausea on day 3. No emesis. No diarrhea. She has mild bleeding when she blows her nose. She continues to have pain at the upper abdomen. Her appetite is poor, but she is eating.  She has resumed glipizide. Checks her blood sugar every morning and reports readings of over 200 when off of the glipizide. The blood sugar has been running approximately 100 back on the glipizide.  Objective:  Vital signs in last 24 hours:  Blood pressure 125/77, pulse (!) 102, temperature 98.4 F (36.9 C), temperature source Oral, resp. rate 18, height '5\' 11"'$  (1.803 m), weight 174 lb 14.4 oz (79.3 kg), SpO2 100 %.    HEENT: Hyperpigmentation of the mucosa, no thrush or ulcers Resp: End inspiratory fine rales at the bases, no respiratory distress Cardio: Regular rate and rhythm GI: The liver edge is palpable throughout the upper abdomen extending across the midline. Tenderness in the mid upper abdomen over the liver Vascular: No leg edema  Skin: Hyperpigmentation of the hands   Portacath/PICC-without erythema  Lab Results:  Lab Results  Component Value Date   WBC 7.0 08/22/2016   HGB 10.5 (L) 08/22/2016   HCT 31.7 (L) 08/22/2016   MCV 90.3 08/22/2016   PLT 439 (H) 08/22/2016   NEUTROABS 4.1 08/22/2016   Glucose 71, creatinine 0.7 CEA on 08/08/2016-274.4  Medications: I have reviewed the patient's current medications.  Assessment/Plan: 1. 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 She began adjuvant Xeloda on 05/14/2012. Final cycle of Xeloda started on 10/22/2012.  CT abdomen/pelvis 01/10/2016-innumerable hepatic metastases  CT chest 01/12/2016-multiple pulmonary nodules consistent with  metastases  Foundation 1-positive KRASmutation; negative BRAF; microsatellite stable  PD-L1 negative  Cycle 1 FOLFOX 02/01/2016  Cycle 2 FOLFOX 02/15/2016  Cycle 3 FOLFOX 02/29/2016  Cycle 4 FOLFOX 03/14/2016  Cycle 5 FOLFOX 03/28/2016  CTs 04/13/2016-progressive lung and liver metastases  Cycle 1 FOLFIRI/Avastin 04/18/2016  Cycle 2 FOLFIRI/Avastin 05/02/2016  Cycle 3 FOLFIRI/Avastin 05/16/2016  Cycle 4 FOLFIRI/Avastin 05/30/2016  Cycle 5 FOLFIRI/Avastin 06/14/2016  Cycle 6 FOLFIRI/Avastin 06/27/2016  Restaging CT scans 07/07/2016-progressive widespread pulmonary and hepatic metastatic disease. Mediastinal and upper abdominal lymphadenopathy appears similar. Stable ascites without definite signs of carcinomatosis. Small right pleural effusion.  Cycle 7 FOLFIRI/Avastin 07/11/2016  Cycle 8 FOLFIRI/Avastin 07/25/2016  Cycle 9 FOLFIRI/Avastin 08/08/2016  Cycle 10 FOLFIRI/Avastin 08/22/2016 2. Severe microcytic anemia likely secondary to iron deficiency related to bleeding from the colon tumor. The hemoglobin and MCV improved on oral iron. Oral iron was discontinued following office visit 12/16/2012.  Recurrent microcytic anemia July 2017  Stool positive for occult blood 02/01/2016  Referred to GI 3. Diabetes. 4. Hypertension. 5. Hyperlipidemia. 6. History of hand/foot syndrome secondary to Xeloda prompting the Xeloda to be discontinued after day 12, cycle 2. Improved. Xeloda was dose reduced beginning with cycle 3. 7. History of mild diarrhea secondary to Xeloda.  8. Hypercalcemia-Zometa initiated 04/18/2016 9. History of eye tearing-most likely related to seasonal allergies though this could be a manifestation of Xeloda toxicity.  10. Status post root canal 10/02/2012. 11. Colonoscopy 03/13/2013 by Dr. Amedeo Plenty. Pathology on transverse colon polyp showed a tubular adenoma. Next colonoscopy recommendedat a three-year interval. 12. Anorexia/weight  loss 13. Port-A-Cath placement by Dr. Dalbert Batman 01/31/2016 14. Early hand-foot syndrome secondary to 5-fluorouracil  15. 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 The plan is to complete another cycle of FOLFIRI/Avastin today. She will then undergo a restaging CT evaluation. I am concerned she has progressive disease based on her performance status and the rising CEA. I recommend discontinuing glipizide. Judith Stephens insists on continuing the glipizide for now.  She will contact us for nausea following this cycle of chemotherapy. We reviewed the anti-emetics she has on hand.  25 minutes were spent with the patient today. The majority of the time was used for counseling and coordination of care.  Betsy Coder, MD  08/22/2016  10:11 AM

## 2016-08-22 NOTE — Telephone Encounter (Signed)
Gave patient AVS and calender per 08/22/2016 los. [facility] to contact patient with CT schedule. 

## 2016-08-22 NOTE — Patient Instructions (Signed)
[facility] Discharge Instructions for Patients Receiving Chemotherapy  Today you received the following chemotherapy agents:  Avastin, Leucovorin, Irinotecan, Fluorouracil  To help prevent nausea and vomiting after your treatment, we encourage you to take your nausea medication as prescribed.   If you develop nausea and vomiting that is not controlled by your nausea medication, call the clinic.   BELOW ARE SYMPTOMS THAT SHOULD BE REPORTED IMMEDIATELY:  *FEVER GREATER THAN 100.5 F  *CHILLS WITH OR WITHOUT FEVER  NAUSEA AND VOMITING THAT IS NOT CONTROLLED WITH YOUR NAUSEA MEDICATION  *UNUSUAL SHORTNESS OF BREATH  *UNUSUAL BRUISING OR BLEEDING  TENDERNESS IN MOUTH AND THROAT WITH OR WITHOUT PRESENCE OF ULCERS  *URINARY PROBLEMS  *BOWEL PROBLEMS  UNUSUAL RASH Items with * indicate a potential emergency and should be followed up as soon as possible.  Feel free to call the clinic you have any questions or concerns. The clinic phone number is [phone].  Please show the CHEMO ALERT CARD at check-in to the Emergency Department and triage nurse.   

## 2016-08-24 ENCOUNTER — Ambulatory Visit (HOSPITAL_BASED_OUTPATIENT_CLINIC_OR_DEPARTMENT_OTHER): Payer: Medicare Other

## 2016-08-24 DIAGNOSIS — Z452 Encounter for adjustment and management of vascular access device: Secondary | ICD-10-CM | POA: Diagnosis not present

## 2016-08-24 DIAGNOSIS — C182 Malignant neoplasm of ascending colon: Secondary | ICD-10-CM | POA: Diagnosis not present

## 2016-08-24 MED ORDER — HEPARIN SOD (PORK) LOCK FLUSH 100 UNIT/ML IV SOLN
500.0000 [IU] | Freq: Once | INTRAVENOUS | Status: AC | PRN
  Administered 2016-08-24: 500 [IU]
  Filled 2016-08-24: qty 5

## 2016-08-24 MED ORDER — SODIUM CHLORIDE 0.9% FLUSH
10.0000 mL | INTRAVENOUS | Status: DC | PRN
  Administered 2016-08-24: 10 mL
  Filled 2016-08-24: qty 10

## 2016-08-24 NOTE — Patient Instructions (Signed)

## 2016-08-28 ENCOUNTER — Other Ambulatory Visit: Payer: Self-pay | Admitting: *Deleted

## 2016-08-28 DIAGNOSIS — C182 Malignant neoplasm of ascending colon: Secondary | ICD-10-CM

## 2016-08-28 MED ORDER — ONDANSETRON HCL 8 MG PO TABS: 8.0000 mg | ORAL_TABLET | Freq: Three times a day (TID) | ORAL | 1 refills | Status: DC | PRN

## 2016-09-04 ENCOUNTER — Telehealth: Payer: Self-pay | Admitting: *Deleted

## 2016-09-04 ENCOUNTER — Other Ambulatory Visit: Payer: Medicare Other

## 2016-09-04 ENCOUNTER — Encounter (HOSPITAL_COMMUNITY): Payer: Self-pay

## 2016-09-04 ENCOUNTER — Ambulatory Visit (HOSPITAL_COMMUNITY)
Admission: RE | Admit: 2016-09-04 | Discharge: 2016-09-04 | Disposition: A | Discharge status: Home / Self Care | Payer: Medicare Other | Source: Ambulatory Visit | Attending: Oncology | Admitting: Oncology

## 2016-09-04 DIAGNOSIS — K8020 Calculus of gallbladder without cholecystitis without obstruction: Secondary | ICD-10-CM | POA: Diagnosis not present

## 2016-09-04 DIAGNOSIS — C787 Secondary malignant neoplasm of liver and intrahepatic bile duct: Secondary | ICD-10-CM | POA: Diagnosis not present

## 2016-09-04 DIAGNOSIS — J90 Pleural effusion, not elsewhere classified: Secondary | ICD-10-CM | POA: Diagnosis not present

## 2016-09-04 DIAGNOSIS — C182 Malignant neoplasm of ascending colon: Secondary | ICD-10-CM

## 2016-09-04 DIAGNOSIS — R195 Other fecal abnormalities: Secondary | ICD-10-CM | POA: Insufficient documentation

## 2016-09-04 DIAGNOSIS — C7800 Secondary malignant neoplasm of unspecified lung: Secondary | ICD-10-CM | POA: Insufficient documentation

## 2016-09-04 DIAGNOSIS — M5137 Other intervertebral disc degeneration, lumbosacral region: Secondary | ICD-10-CM | POA: Diagnosis not present

## 2016-09-04 DIAGNOSIS — C189 Malignant neoplasm of colon, unspecified: Secondary | ICD-10-CM | POA: Diagnosis not present

## 2016-09-04 DIAGNOSIS — C772 Secondary and unspecified malignant neoplasm of intra-abdominal lymph nodes: Secondary | ICD-10-CM | POA: Diagnosis not present

## 2016-09-04 MED ORDER — IOPAMIDOL (ISOVUE-300) INJECTION 61%
INTRAVENOUS | Status: AC
  Administered 2016-09-04: 100 mL
  Filled 2016-09-04: qty 100

## 2016-09-04 MED ORDER — HEPARIN SOD (PORK) LOCK FLUSH 100 UNIT/ML IV SOLN
500.0000 [IU] | Freq: Once | INTRAVENOUS | Status: AC
  Administered 2016-09-04: 5 [IU]

## 2016-09-04 NOTE — Telephone Encounter (Signed)
"  I have a scan today at 3:00.  Calling to confirm the appointment.  What time do I need to drink the contrast.  I haven't eaten since last night."  Advised to drink contrast two hours and one hour before scans.  Nothing by mouth four hours before scan and this time has passed.  No further questions.

## 2016-09-05 ENCOUNTER — Ambulatory Visit: Payer: Medicare Other

## 2016-09-05 ENCOUNTER — Other Ambulatory Visit: Payer: Self-pay | Admitting: Nurse Practitioner

## 2016-09-05 ENCOUNTER — Other Ambulatory Visit (HOSPITAL_BASED_OUTPATIENT_CLINIC_OR_DEPARTMENT_OTHER): Payer: Medicare Other

## 2016-09-05 ENCOUNTER — Telehealth: Payer: Self-pay | Admitting: Oncology

## 2016-09-05 ENCOUNTER — Ambulatory Visit (HOSPITAL_BASED_OUTPATIENT_CLINIC_OR_DEPARTMENT_OTHER): Payer: Medicare Other | Admitting: Nurse Practitioner

## 2016-09-05 VITALS — BP 123/80 | HR 101 | Temp 98.6°F | Resp 18 | Wt 173.4 lb

## 2016-09-05 DIAGNOSIS — C7800 Secondary malignant neoplasm of unspecified lung: Secondary | ICD-10-CM | POA: Diagnosis not present

## 2016-09-05 DIAGNOSIS — C787 Secondary malignant neoplasm of liver and intrahepatic bile duct: Secondary | ICD-10-CM | POA: Diagnosis not present

## 2016-09-05 DIAGNOSIS — E119 Type 2 diabetes mellitus without complications: Secondary | ICD-10-CM | POA: Diagnosis not present

## 2016-09-05 DIAGNOSIS — C182 Malignant neoplasm of ascending colon: Secondary | ICD-10-CM | POA: Diagnosis not present

## 2016-09-05 DIAGNOSIS — Z95828 Presence of other vascular implants and grafts: Secondary | ICD-10-CM

## 2016-09-05 DIAGNOSIS — D509 Iron deficiency anemia, unspecified: Secondary | ICD-10-CM | POA: Diagnosis not present

## 2016-09-05 DIAGNOSIS — I10 Essential (primary) hypertension: Secondary | ICD-10-CM | POA: Diagnosis not present

## 2016-09-05 DIAGNOSIS — Z452 Encounter for adjustment and management of vascular access device: Secondary | ICD-10-CM | POA: Diagnosis not present

## 2016-09-05 DIAGNOSIS — G620 Drug-induced polyneuropathy: Secondary | ICD-10-CM

## 2016-09-05 DIAGNOSIS — C189 Malignant neoplasm of colon, unspecified: Secondary | ICD-10-CM

## 2016-09-05 LAB — CBC WITH DIFFERENTIAL/PLATELET
BASO%: 0.8 % (ref 0.0–2.0)
Basophils Absolute: 0.1 10*3/uL (ref 0.0–0.1)
EOS ABS: 0.1 10*3/uL (ref 0.0–0.5)
EOS%: 0.9 % (ref 0.0–7.0)
HEMATOCRIT: 31.1 % — AB (ref 34.8–46.6)
HGB: 10.1 g/dL — ABNORMAL LOW (ref 11.6–15.9)
LYMPH#: 1.5 10*3/uL (ref 0.9–3.3)
LYMPH%: 21.6 % (ref 14.0–49.7)
MCH: 29.2 pg (ref 25.1–34.0)
MCHC: 32.6 g/dL (ref 31.5–36.0)
MCV: 89.5 fL (ref 79.5–101.0)
MONO#: 0.8 10*3/uL (ref 0.1–0.9)
MONO%: 12.1 % (ref 0.0–14.0)
NEUT#: 4.5 10*3/uL (ref 1.5–6.5)
NEUT%: 64.6 % (ref 38.4–76.8)
PLATELETS: 274 10*3/uL (ref 145–400)
RBC: 3.47 10*6/uL — AB (ref 3.70–5.45)
RDW: 16.2 % — ABNORMAL HIGH (ref 11.2–14.5)
WBC: 6.9 10*3/uL (ref 3.9–10.3)

## 2016-09-05 LAB — COMPREHENSIVE METABOLIC PANEL
ALT: 15 U/L (ref 0–55)
ANION GAP: 11 meq/L (ref 3–11)
AST: 89 U/L — ABNORMAL HIGH (ref 5–34)
Albumin: 3 g/dL — ABNORMAL LOW (ref 3.5–5.0)
Alkaline Phosphatase: 213 U/L — ABNORMAL HIGH (ref 40–150)
BUN: 9.1 mg/dL (ref 7.0–26.0)
CHLORIDE: 109 meq/L (ref 98–109)
CO2: 22 meq/L (ref 22–29)
Calcium: 10.4 mg/dL (ref 8.4–10.4)
Creatinine: 0.7 mg/dL (ref 0.6–1.1)
Glucose: 60 mg/dl — ABNORMAL LOW (ref 70–140)
POTASSIUM: 4 meq/L (ref 3.5–5.1)
Sodium: 142 mEq/L (ref 136–145)
Total Bilirubin: 0.63 mg/dL (ref 0.20–1.20)
Total Protein: 7.3 g/dL (ref 6.4–8.3)

## 2016-09-05 LAB — CEA (IN HOUSE-CHCC): CEA (CHCC-IN HOUSE): 341.54 ng/mL — AB (ref 0.00–5.00)

## 2016-09-05 MED ORDER — SODIUM CHLORIDE 0.9 % IV SOLN: Freq: Once | INTRAVENOUS | Status: DC

## 2016-09-05 MED ORDER — ALTEPLASE 2 MG IJ SOLR
2.0000 mg | Freq: Once | INTRAMUSCULAR | Status: DC | PRN
  Filled 2016-09-05: qty 2

## 2016-09-05 MED ORDER — ZOLEDRONIC ACID 4 MG/100ML IV SOLN: 4.0000 mg | Freq: Once | INTRAVENOUS | Status: DC

## 2016-09-05 MED ORDER — SODIUM CHLORIDE 0.9% FLUSH
3.0000 mL | Freq: Once | INTRAVENOUS | Status: DC | PRN
  Filled 2016-09-05: qty 10

## 2016-09-05 MED ORDER — HEPARIN SOD (PORK) LOCK FLUSH 100 UNIT/ML IV SOLN
500.0000 [IU] | Freq: Once | INTRAVENOUS | Status: DC | PRN
Start: 2016-09-05 — End: 2016-09-05
  Filled 2016-09-05: qty 5

## 2016-09-05 MED ORDER — HEPARIN SOD (PORK) LOCK FLUSH 100 UNIT/ML IV SOLN
250.0000 [IU] | Freq: Once | INTRAVENOUS | Status: DC | PRN
  Filled 2016-09-05: qty 5

## 2016-09-05 MED ORDER — HEPARIN SOD (PORK) LOCK FLUSH 100 UNIT/ML IV SOLN
500.0000 [IU] | Freq: Once | INTRAVENOUS | Status: AC | PRN
  Administered 2016-09-05: 500 [IU] via INTRAVENOUS
  Filled 2016-09-05: qty 5

## 2016-09-05 MED ORDER — SODIUM CHLORIDE 0.9% FLUSH
10.0000 mL | INTRAVENOUS | Status: DC | PRN
  Filled 2016-09-05: qty 10

## 2016-09-05 NOTE — Telephone Encounter (Signed)
Appointments scheduled per 09/05/16 los. Patient was given a copy of the AVS report and appointment schedule per 09/05/16 los. °

## 2016-09-05 NOTE — Progress Notes (Addendum)
[facility] OFFICE PROGRESS NOTE   Diagnosis:  Colon cancer  INTERVAL HISTORY:   Judith Stephens returns as scheduled. She completed cycle 11 FOLFIRI/Avastin 08/22/2016. She denies nausea/vomiting. No diarrhea. No significant abdominal pain. Overall good appetite.  Objective:  Vital signs in last 24 hours:  Blood pressure 123/80, pulse (!) 101, temperature 98.6 F (37 C), temperature source Oral, resp. rate 18, weight 173 lb 6.4 oz (78.7 kg), SpO2 100 %.    HEENT: No thrush or ulcers. Resp: Lungs clear bilaterally. Breath sounds diminished at the right lower lung field. No respiratory distress. Cardio: Regular rate and rhythm. GI: Liver is palpable throughout the upper abdomen and crossing midline. Vascular: No leg edema. Port-A-Cath without erythema.  Lab Results:  Lab Results  Component Value Date   WBC 6.9 09/05/2016   HGB 10.1 (L) 09/05/2016   HCT 31.1 (L) 09/05/2016   MCV 89.5 09/05/2016   PLT 274 09/05/2016   NEUTROABS 4.5 09/05/2016    Imaging:  Ct Chest W Contrast  Result Date: 09/04/2016 CLINICAL DATA:  Colon cancer diagnosed 5/6 years ago. Ascending colonic primary. Restaging. Diabetes. Hypertension. Anemia. EXAM: CT CHEST, ABDOMEN, AND PELVIS WITH CONTRAST TECHNIQUE: Multidetector CT imaging of the chest, abdomen and pelvis was performed following the standard protocol during bolus administration of intravenous contrast. CONTRAST:  113m ISOVUE-300 IOPAMIDOL (ISOVUE-300) INJECTION 61% COMPARISON:  07/07/2016 FINDINGS: CT CHEST FINDINGS Cardiovascular: Tortuous thoracic aorta. Normal heart size, without pericardial effusion. No central pulmonary embolism, on this non-dedicated study. Mediastinum/Nodes: No supraclavicular adenopathy. Right paratracheal node is similar at 9 mm on image 18/series 2. No hilar adenopathy. Low periesophageal node is similar at 12 mm on image 37/series 2. Lungs/Pleura: Small right pleural effusion is minimally increased. Trace  left pleural fluid is new. Mild motion degradation throughout. Extensive bilateral pulmonary metastasis. Index left upper lobe nodule measures 1.6 cm on image 56/series 6. Compare 1.4 cm on the prior. Index right lower lobe nodule measures 1.4 cm on image 56/series 6. Compare 1.2 cm on the prior. More cephalad left upper lobe index nodule measures 1.3 cm on image 41/series 6 versus 1.1 cm on the prior. No lobar consolidation. Musculoskeletal: No acute osseous abnormality. CT ABDOMEN PELVIS FINDINGS Hepatobiliary: Hepatomegaly at 22.0 cm craniocaudal. Extensive hepatic metastasis. Index high right hepatic lobe lesion measures 2.9 x 2.4 cm on image 40/series 2. Compare 2.7 x 2.1 cm on the prior exam (when remeasured). Calcified lesion just cephalad to the gallbladder is similar at 3.3 cm on image 58/series 2. Anterior left hepatic lobe 1.5 cm lesion image 54/series 2 is felt to be minimally enlarged from 1.4 cm previously. More central left lateral liver or lesion measures 1.7 cm on image 55/series 2 and is similar. Cholelithiasis without cholecystitis or biliary duct dilatation. Pancreas: Normal, without mass or ductal dilatation. Spleen: Subcapsular subcentimeter low-density splenic lesion is unchanged and of doubtful clinical significance. Adrenals/Urinary Tract: Normal adrenal glands. Right renal cysts. An interpolar right renal lesion measures 1.7 cm on image 78/series 2 and demonstrates complexity. This is unchanged in size to on the prior and similar in size back to 03/05/2012. No hydronephrosis. Normal urinary bladder. Stomach/Bowel: Mild motion degradation continuing into the upper abdomen. Portions of the stomach are collapsed. Colonic stool burden suggests constipation. Partial right hemicolectomy. Normal small bowel. Vascular/Lymphatic: Aortic and branch vessel atherosclerosis. Upper abdominal adenopathy, including a gastrohepatic ligament node of 1.6 cm on image 52/series 2. This is felt to be similar to  the prior exam (when remeasured).  No pelvic sidewall adenopathy. Reproductive: Normal uterus and adnexa. Other: Small volume abdominopelvic fluid is unchanged. No specific findings for omental or peritoneal metastasis. Small bilateral fat containing inguinal hernias. Musculoskeletal: No acute osseous abnormality. 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 IMPRESSION: 1. Mild progression of pulmonary metastasis. 2. Similar to minimal progression of hepatic metastasis. 3. Similar lower thoracic and upper abdominal nodal metastasis. 4. Mild motion degradation. 5. Similar small volume abdominopelvic fluid. 6. Increase in small right pleural effusion with new trace left pleural fluid. 7. Cholelithiasis. 8.  Possible constipation. Electronically Signed   By: Abigail Miyamoto M.D.   On: 09/04/2016 19:39   Ct Abdomen Pelvis W Contrast  Result Date: 09/04/2016 CLINICAL DATA:  Colon cancer diagnosed 5/6 years ago. Ascending colonic primary. Restaging. Diabetes. Hypertension. Anemia. EXAM: CT CHEST, ABDOMEN, AND PELVIS WITH CONTRAST TECHNIQUE: Multidetector CT imaging of the chest, abdomen and pelvis was performed following the standard protocol during bolus administration of intravenous contrast. CONTRAST:  140m ISOVUE-300 IOPAMIDOL (ISOVUE-300) INJECTION 61% COMPARISON:  07/07/2016 FINDINGS: CT CHEST FINDINGS Cardiovascular: Tortuous thoracic aorta. Normal heart size, without pericardial effusion. No central pulmonary embolism, on this non-dedicated study. Mediastinum/Nodes: No supraclavicular adenopathy. Right paratracheal node is similar at 9 mm on image 18/series 2. No hilar adenopathy. Low periesophageal node is similar at 12 mm on image 37/series 2. Lungs/Pleura: Small right pleural effusion is minimally increased. Trace left pleural fluid is new. Mild motion degradation throughout. Extensive bilateral pulmonary metastasis. Index left upper lobe nodule measures 1.6 cm on image 56/series 6. Compare  1.4 cm on the prior. Index right lower lobe nodule measures 1.4 cm on image 56/series 6. Compare 1.2 cm on the prior. More cephalad left upper lobe index nodule measures 1.3 cm on image 41/series 6 versus 1.1 cm on the prior. No lobar consolidation. Musculoskeletal: No acute osseous abnormality. CT ABDOMEN PELVIS FINDINGS Hepatobiliary: Hepatomegaly at 22.0 cm craniocaudal. Extensive hepatic metastasis. Index high right hepatic lobe lesion measures 2.9 x 2.4 cm on image 40/series 2. Compare 2.7 x 2.1 cm on the prior exam (when remeasured). Calcified lesion just cephalad to the gallbladder is similar at 3.3 cm on image 58/series 2. Anterior left hepatic lobe 1.5 cm lesion image 54/series 2 is felt to be minimally enlarged from 1.4 cm previously. More central left lateral liver or lesion measures 1.7 cm on image 55/series 2 and is similar. Cholelithiasis without cholecystitis or biliary duct dilatation. Pancreas: Normal, without mass or ductal dilatation. Spleen: Subcapsular subcentimeter low-density splenic lesion is unchanged and of doubtful clinical significance. Adrenals/Urinary Tract: Normal adrenal glands. Right renal cysts. An interpolar right renal lesion measures 1.7 cm on image 78/series 2 and demonstrates complexity. This is unchanged in size to on the prior and similar in size back to 03/05/2012. No hydronephrosis. Normal urinary bladder. Stomach/Bowel: Mild motion degradation continuing into the upper abdomen. Portions of the stomach are collapsed. Colonic stool burden suggests constipation. Partial right hemicolectomy. Normal small bowel. Vascular/Lymphatic: Aortic and branch vessel atherosclerosis. Upper abdominal adenopathy, including a gastrohepatic ligament node of 1.6 cm on image 52/series 2. This is felt to be similar to the prior exam (when remeasured). No pelvic sidewall adenopathy. Reproductive: Normal uterus and adnexa. Other: Small volume abdominopelvic fluid is unchanged. No specific findings  for omental or peritoneal metastasis. Small bilateral fat containing inguinal hernias. Musculoskeletal: No acute osseous abnormality. 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 IMPRESSION: 1. Mild progression of pulmonary metastasis. 2. Similar to minimal progression of hepatic metastasis. 3.  Similar lower thoracic and upper abdominal nodal metastasis. 4. Mild motion degradation. 5. Similar small volume abdominopelvic fluid. 6. Increase in small right pleural effusion with new trace left pleural fluid. 7. Cholelithiasis. 8.  Possible constipation. Electronically Signed   By: Abigail Miyamoto M.D.   On: 09/04/2016 19:39    Medications: I have reviewed the patient's current medications.  Assessment/Plan: 1. 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 She began adjuvant Xeloda on 05/14/2012. Final cycle of Xeloda started on 10/22/2012  CT abdomen/pelvis 01/10/2016-innumerable hepatic metastases  CT chest 01/12/2016-multiple pulmonary nodules consistent with metastases  Foundation 1-positive KRASmutation; negative BRAF; microsatellite stable  PD-L1 negative  Cycle 1 FOLFOX 02/01/2016  Cycle 2 FOLFOX 02/15/2016  Cycle 3 FOLFOX 02/29/2016  Cycle 4 FOLFOX 03/14/2016  Cycle 5 FOLFOX 03/28/2016  CTs 04/13/2016-progressive lung and liver metastases  Cycle 1 FOLFIRI/Avastin 04/18/2016  Cycle 2 FOLFIRI/Avastin 05/02/2016  Cycle 3 FOLFIRI/Avastin 05/16/2016  Cycle 4 FOLFIRI/Avastin 05/30/2016  Cycle 5 FOLFIRI/Avastin 06/14/2016  Cycle 6 FOLFIRI/Avastin 06/27/2016  Restaging CT scans 07/07/2016-progressive widespread pulmonary and hepatic metastatic disease. Mediastinal and upper abdominal lymphadenopathy appears similar. Stable ascites without definite signs of carcinomatosis. Small right pleural effusion.  Cycle 7 FOLFIRI/Avastin 07/11/2016  Cycle 8 FOLFIRI/Avastin 07/25/2016  Cycle 9  FOLFIRI/Avastin 08/08/2016  Cycle 10 FOLFIRI/Avastin 08/22/2016  Restaging CTs 09/04/2016-mild progression of pulmonary metastasis; similar to minimal progression of hepatic metastasis. Similar lower thoracic and upper abdominal nodal metastasis. Increase in small right pleural effusion with new trace left pleural fluid. 2. Severe microcytic anemia likely secondary to iron deficiency related to bleeding from the colon tumor. The hemoglobin and MCV improved on oral iron. Oral iron was discontinued following office visit 12/16/2012.  Recurrent microcytic anemia July 2017  Stool positive for occult blood 02/01/2016  Referred to GI 3. Diabetes. 4. Hypertension. 5. Hyperlipidemia. 6. History of hand/foot syndrome secondary to Xeloda prompting the Xeloda to be discontinued after day 12, cycle 2. Improved. Xeloda was dose reduced beginning with cycle 3. 7. History of mild diarrhea secondary to Xeloda.  8. Hypercalcemia-Zometa initiated 04/18/2016 9. History of eye tearing-most likely related to seasonal allergies though this could be a manifestation of Xeloda toxicity.  10. Status post root canal 10/02/2012. 11. Colonoscopy 03/13/2013 by Dr. Amedeo Plenty. Pathology on transverse colon polyp showed a tubular adenoma. Next colonoscopy recommendedat a three-year interval. 12. Anorexia/weight loss 13. Port-A-Cath placement by Dr. Dalbert Batman 01/31/2016 14. Early hand-foot syndrome secondary to 5-fluorouracil 15. 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 Killingsworth appears unchanged. She has completed 10 cycles of FOLFIRI/Avastin. The recent restaging CT scans show evidence of progression in the lungs and liver. Dr. Benay Spice recommends discontinuation of FOLFIRI/Avastin. We discussed a trial of Lonsurf as well as a referral to oncology at [facility] to see if she is a candidate for a clinical trial. We reviewed  potential toxicities associated with Lonsurf including myelosuppression, nausea, diarrhea. She is agreeable to proceed. She would also like to be seen at [facility]. We made a referral to Dr. Aleatha Borer.  Ms. Cornell will return for a follow-up visit here 09/28/2016. She will contact the office in the interim with any problems.  Patient seen with Dr. Benay Spice. CT images reviewed on the computer. 25 minutes were spent face-to-face at today's visit with the majority of that time involved in counseling/coordination of care.  Ned Card ANP/GNP-BC   09/05/2016  12:36  PM  This was a shared visit with Ned Card. We discussed the restaging CT findings with Ms. Jarvis. I reviewed the CT images. There is evidence of progressive disease in the lungs and liver. The CEA is higher today. FOLFIRI/Avastin will be discontinued.  We discussed the limited standard treatment options with Ms. Ola and her husband. She would like to proceed with a trial of Lonsurf. We will refer her to [facility] to check into clinical trial availability.  Julieanne Manson, M.D.

## 2016-09-07 ENCOUNTER — Other Ambulatory Visit: Payer: Self-pay | Admitting: Emergency Medicine

## 2016-09-07 NOTE — Telephone Encounter (Signed)
Patient called requesting refill for potassium tablets. Patient also inquiring if she needs to continue taking the singular.

## 2016-09-08 ENCOUNTER — Telehealth: Payer: Self-pay | Admitting: Nurse Practitioner

## 2016-09-08 ENCOUNTER — Telehealth: Payer: Self-pay | Admitting: Pharmacist

## 2016-09-08 DIAGNOSIS — C189 Malignant neoplasm of colon, unspecified: Secondary | ICD-10-CM

## 2016-09-08 MED ORDER — TRIFLURIDINE-TIPIRACIL 20-8.19 MG PO TABS: 30.0000 mg/m2 | ORAL_TABLET | Freq: Two times a day (BID) | ORAL | 0 refills | Status: DC

## 2016-09-08 MED FILL — LONSURF 20 MG-8.19 MG TAB: 20-8.19 | 28 days supply | Qty: 60 | Fill #0

## 2016-09-08 NOTE — Telephone Encounter (Signed)
Returned telephone call to patient- She will not need a refill on potassium tablets at this time per Ned Card, NP. Pt may continue to take singulair as prescribed by her PCP- we did not prescribe this medication she will need to contact her PCP for refills.

## 2016-09-08 NOTE — Telephone Encounter (Signed)
Referral for Dr. Criss Alvine at [facility]  sent to [facility] in [place].

## 2016-09-08 NOTE — Telephone Encounter (Signed)
Oral Chemotherapy Pharmacist Encounter  Received new prescription for Lonsurf previously treated metastatic colon cancer  Labs from 08/22/16 reviewed, OK for treatment Current medication list in Epic assessed, no significant DDIs with Xeloda identified  I will e-scribe prescription to [facility] for benefits analysis.  Oral oncology Clinic will continue to follow.  Johny Drilling, PharmD, BCPS, BCOP 09/08/2016  3:24 PM Oral Oncology Clinic [phone]

## 2016-09-11 NOTE — Telephone Encounter (Signed)
Received message from [facility] Call service. Pt was calling to follow up on Lonsurf prescription over the weekend. Spoke with pt's husband today, informed him Frankey Poot is available for pick up at [facility]. Address and directions to pharmacy given. He voiced understanding.

## 2016-09-12 ENCOUNTER — Encounter: Payer: Self-pay | Admitting: *Deleted

## 2016-09-12 ENCOUNTER — Telehealth: Payer: Self-pay | Admitting: Oncology

## 2016-09-12 ENCOUNTER — Telehealth: Payer: Self-pay | Admitting: *Deleted

## 2016-09-12 NOTE — Telephone Encounter (Signed)
Faxed records to [place] with Dr. Aleatha Borer @ [facility]. Izora Gala will call pt with appt.

## 2016-09-12 NOTE — Telephone Encounter (Signed)
Patient calling with quesstions re: how to take her lonsurf and when to start. Johny Drilling in oral chemo clinic to call patient with assistance.

## 2016-09-12 NOTE — Telephone Encounter (Signed)
Oral Chemotherapy Pharmacist Encounter   I spoke with patient for overview of new oral chemotherapy medication: Lonsurf. Pt is doing well, she has already picked up her medication from the [facility]. Copayment $10. Planned start date Monday 09/18/16   Counseled patient on administration, dosing, side effects, safe handling, and monitoring. Patient will take 3 tablets (60mg  total) by mouth 2 times daily after a meal. She will take her Lonsurf 2 times daily on days 1-5 and days 8-12 every 28 days. She will start on a Monday, she knows to NOT take her Lonsurf on Saturday and Sunday of the first week of he cycle, and to take 2 weeks off after her doses on day 12.  Side effects include but not limited to: N/V/D, decreased appetite, fatigue, and decreased blood cell counts.  Judith Stephens voiced understanding and appreciation.   All questions answered.  Patient knows to call the office with questions or concerns.  Oral Oncology Clinic will continue to follow.  Thank you,  Johny Drilling, PharmD, BCPS, BCOP 09/12/2016  10:33 AM Oral Oncology Clinic [phone]

## 2016-09-13 ENCOUNTER — Encounter: Payer: Self-pay | Admitting: Podiatry

## 2016-09-13 ENCOUNTER — Ambulatory Visit (INDEPENDENT_AMBULATORY_CARE_PROVIDER_SITE_OTHER): Payer: Medicare Other | Admitting: Podiatry

## 2016-09-13 DIAGNOSIS — E1140 Type 2 diabetes mellitus with diabetic neuropathy, unspecified: Secondary | ICD-10-CM

## 2016-09-13 DIAGNOSIS — B351 Tinea unguium: Secondary | ICD-10-CM

## 2016-09-13 DIAGNOSIS — M79676 Pain in unspecified toe(s): Secondary | ICD-10-CM | POA: Diagnosis not present

## 2016-09-13 NOTE — Progress Notes (Signed)
Patient ID: Judith Stephens, female   DOB: 10/18/1940, 76 y.o.   MRN: [id number] Complaint:  Visit Type: Patient returns to my office for continued preventative foot care services. Complaint: Patient states" my nails have grown long and thick and become painful to walk and wear shoes" Patient has been diagnosed with DM with no foot complications. The patient presents for preventative foot care services. Patient under chemotherapy last six months.  Podiatric Exam: Vascular: dorsalis pedis and posterior tibial pulses are palpable bilateral. Capillary return is immediate. Temperature gradient is WNL. Skin turgor WNL  Sensorium: Diminished  Semmes Weinstein monofilament test. Normal tactile sensation bilaterally. Nail Exam: Pt has thick disfigured discolored nails with subungual debris noted bilateral entire nail hallux through fifth toenails Ulcer Exam: There is no evidence of ulcer or pre-ulcerative changes or infection. Orthopedic Exam: Muscle tone and strength are WNL. No limitations in general ROM. No crepitus or effusions noted. Foot type and digits show no abnormalities. Bony prominences are unremarkable. Swelling both feet. Skin: No Porokeratosis. No infection or ulcers  Diagnosis:  Onychomycosis, , Pain in right toe, pain in left toes  Treatment & Plan Procedures and Treatment: Consent by patient was obtained for treatment procedures. The patient understood the discussion of treatment and procedures well. All questions were answered thoroughly reviewed. Debridement of mycotic and hypertrophic toenails, 1 through 5 bilateral and clearing of subungual debris. No ulceration, no infection noted.  Return Visit-Office Procedure: Patient instructed to return to the office for a follow up visit 3 months for continued evaluation and treatment.    Gardiner Barefoot DPM

## 2016-09-14 ENCOUNTER — Telehealth: Payer: Self-pay | Admitting: *Deleted

## 2016-09-14 ENCOUNTER — Telehealth: Payer: Self-pay | Admitting: Oncology

## 2016-09-14 NOTE — Telephone Encounter (Signed)
Call from pt asking if she is supposed to stop her daily medications prescribed by PCP. There is no mention of this in progress notes. Informed her that she does not need the potassium right now but to continue other medications. She voiced understanding.

## 2016-09-14 NOTE — Telephone Encounter (Signed)
Pt appt with Dr. Aleatha Borer is 09/29/16 @ 10:00 Pt is aware.

## 2016-09-22 ENCOUNTER — Telehealth: Payer: Self-pay

## 2016-09-22 NOTE — Telephone Encounter (Signed)
Pt called c/o pain in her stomach. Right side higher than belly button. Started hurting Saturday. It is sore and achey. Worst 7/10. Feels tired and legs weak as well. No nausea, is able to eat, is moving bowels. No fever. Started Lonsurf 4/2. Last folfiri was 3/6 and was discontinued.   s/w Dr Benay Spice and he said to find out how she is taking her pain med and may increase it to 2 tabs q4hr prn.  Pt said she was taking 1 tab about 2x/day. Instructed her she can take up to 6x/day and can take 2 tabs.   Instructed her about possible constipation.

## 2016-09-25 ENCOUNTER — Encounter (HOSPITAL_COMMUNITY): Payer: Self-pay

## 2016-09-25 ENCOUNTER — Emergency Department (HOSPITAL_COMMUNITY): Payer: Medicare Other

## 2016-09-25 ENCOUNTER — Inpatient Hospital Stay (HOSPITAL_COMMUNITY)
Admission: EM | Admit: 2016-09-25 | Discharge: 2016-10-17 | DRG: 871 | Disposition: E | Payer: Medicare Other | Attending: Internal Medicine | Admitting: Internal Medicine

## 2016-09-25 DIAGNOSIS — K8020 Calculus of gallbladder without cholecystitis without obstruction: Secondary | ICD-10-CM | POA: Diagnosis present

## 2016-09-25 DIAGNOSIS — G9341 Metabolic encephalopathy: Secondary | ICD-10-CM | POA: Diagnosis not present

## 2016-09-25 DIAGNOSIS — D638 Anemia in other chronic diseases classified elsewhere: Secondary | ICD-10-CM | POA: Diagnosis present

## 2016-09-25 DIAGNOSIS — R404 Transient alteration of awareness: Secondary | ICD-10-CM | POA: Diagnosis not present

## 2016-09-25 DIAGNOSIS — R05 Cough: Secondary | ICD-10-CM | POA: Diagnosis present

## 2016-09-25 DIAGNOSIS — R188 Other ascites: Secondary | ICD-10-CM | POA: Diagnosis not present

## 2016-09-25 DIAGNOSIS — C787 Secondary malignant neoplasm of liver and intrahepatic bile duct: Secondary | ICD-10-CM | POA: Diagnosis present

## 2016-09-25 DIAGNOSIS — I10 Essential (primary) hypertension: Secondary | ICD-10-CM | POA: Diagnosis present

## 2016-09-25 DIAGNOSIS — Z8249 Family history of ischemic heart disease and other diseases of the circulatory system: Secondary | ICD-10-CM

## 2016-09-25 DIAGNOSIS — R6520 Severe sepsis without septic shock: Secondary | ICD-10-CM | POA: Diagnosis present

## 2016-09-25 DIAGNOSIS — A419 Sepsis, unspecified organism: Secondary | ICD-10-CM | POA: Diagnosis not present

## 2016-09-25 DIAGNOSIS — E785 Hyperlipidemia, unspecified: Secondary | ICD-10-CM | POA: Diagnosis present

## 2016-09-25 DIAGNOSIS — F419 Anxiety disorder, unspecified: Secondary | ICD-10-CM | POA: Diagnosis present

## 2016-09-25 DIAGNOSIS — Z515 Encounter for palliative care: Secondary | ICD-10-CM | POA: Diagnosis not present

## 2016-09-25 DIAGNOSIS — R531 Weakness: Secondary | ICD-10-CM | POA: Diagnosis not present

## 2016-09-25 DIAGNOSIS — Z87891 Personal history of nicotine dependence: Secondary | ICD-10-CM

## 2016-09-25 DIAGNOSIS — D649 Anemia, unspecified: Secondary | ICD-10-CM | POA: Diagnosis present

## 2016-09-25 DIAGNOSIS — K5730 Diverticulosis of large intestine without perforation or abscess without bleeding: Secondary | ICD-10-CM | POA: Diagnosis present

## 2016-09-25 DIAGNOSIS — Z7951 Long term (current) use of inhaled steroids: Secondary | ICD-10-CM

## 2016-09-25 DIAGNOSIS — C7802 Secondary malignant neoplasm of left lung: Secondary | ICD-10-CM | POA: Diagnosis not present

## 2016-09-25 DIAGNOSIS — J90 Pleural effusion, not elsewhere classified: Secondary | ICD-10-CM | POA: Diagnosis not present

## 2016-09-25 DIAGNOSIS — D6481 Anemia due to antineoplastic chemotherapy: Secondary | ICD-10-CM | POA: Diagnosis present

## 2016-09-25 DIAGNOSIS — G9340 Encephalopathy, unspecified: Secondary | ICD-10-CM

## 2016-09-25 DIAGNOSIS — R0600 Dyspnea, unspecified: Secondary | ICD-10-CM

## 2016-09-25 DIAGNOSIS — E869 Volume depletion, unspecified: Secondary | ICD-10-CM | POA: Diagnosis present

## 2016-09-25 DIAGNOSIS — R112 Nausea with vomiting, unspecified: Secondary | ICD-10-CM | POA: Diagnosis present

## 2016-09-25 DIAGNOSIS — Z6824 Body mass index (BMI) 24.0-24.9, adult: Secondary | ICD-10-CM

## 2016-09-25 DIAGNOSIS — R197 Diarrhea, unspecified: Secondary | ICD-10-CM

## 2016-09-25 DIAGNOSIS — N179 Acute kidney failure, unspecified: Secondary | ICD-10-CM

## 2016-09-25 DIAGNOSIS — K7290 Hepatic failure, unspecified without coma: Secondary | ICD-10-CM | POA: Diagnosis not present

## 2016-09-25 DIAGNOSIS — R109 Unspecified abdominal pain: Secondary | ICD-10-CM | POA: Diagnosis not present

## 2016-09-25 DIAGNOSIS — I110 Hypertensive heart disease with heart failure: Secondary | ICD-10-CM | POA: Diagnosis present

## 2016-09-25 DIAGNOSIS — E7220 Disorder of urea cycle metabolism, unspecified: Secondary | ICD-10-CM

## 2016-09-25 DIAGNOSIS — I959 Hypotension, unspecified: Secondary | ICD-10-CM | POA: Diagnosis not present

## 2016-09-25 DIAGNOSIS — Z85038 Personal history of other malignant neoplasm of large intestine: Secondary | ICD-10-CM

## 2016-09-25 DIAGNOSIS — I5033 Acute on chronic diastolic (congestive) heart failure: Secondary | ICD-10-CM | POA: Diagnosis not present

## 2016-09-25 DIAGNOSIS — Z7982 Long term (current) use of aspirin: Secondary | ICD-10-CM

## 2016-09-25 DIAGNOSIS — R059 Cough, unspecified: Secondary | ICD-10-CM | POA: Diagnosis present

## 2016-09-25 DIAGNOSIS — E039 Hypothyroidism, unspecified: Secondary | ICD-10-CM | POA: Diagnosis present

## 2016-09-25 DIAGNOSIS — C189 Malignant neoplasm of colon, unspecified: Secondary | ICD-10-CM | POA: Diagnosis present

## 2016-09-25 DIAGNOSIS — C7801 Secondary malignant neoplasm of right lung: Secondary | ICD-10-CM | POA: Diagnosis present

## 2016-09-25 DIAGNOSIS — M1990 Unspecified osteoarthritis, unspecified site: Secondary | ICD-10-CM | POA: Diagnosis present

## 2016-09-25 DIAGNOSIS — R52 Pain, unspecified: Secondary | ICD-10-CM

## 2016-09-25 DIAGNOSIS — R140 Abdominal distension (gaseous): Secondary | ICD-10-CM

## 2016-09-25 DIAGNOSIS — Z7984 Long term (current) use of oral hypoglycemic drugs: Secondary | ICD-10-CM

## 2016-09-25 DIAGNOSIS — R079 Chest pain, unspecified: Secondary | ICD-10-CM | POA: Diagnosis not present

## 2016-09-25 DIAGNOSIS — E46 Unspecified protein-calorie malnutrition: Secondary | ICD-10-CM | POA: Diagnosis present

## 2016-09-25 DIAGNOSIS — I471 Supraventricular tachycardia: Secondary | ICD-10-CM | POA: Diagnosis not present

## 2016-09-25 DIAGNOSIS — E119 Type 2 diabetes mellitus without complications: Secondary | ICD-10-CM | POA: Diagnosis present

## 2016-09-25 DIAGNOSIS — C7800 Secondary malignant neoplasm of unspecified lung: Secondary | ICD-10-CM | POA: Diagnosis present

## 2016-09-25 DIAGNOSIS — R4182 Altered mental status, unspecified: Secondary | ICD-10-CM | POA: Diagnosis present

## 2016-09-25 DIAGNOSIS — Z66 Do not resuscitate: Secondary | ICD-10-CM | POA: Diagnosis present

## 2016-09-25 LAB — COMPREHENSIVE METABOLIC PANEL
ALK PHOS: 194 U/L — AB (ref 38–126)
ALT: 20 U/L (ref 14–54)
AST: 113 U/L — ABNORMAL HIGH (ref 15–41)
Albumin: 2.8 g/dL — ABNORMAL LOW (ref 3.5–5.0)
Anion gap: 7 (ref 5–15)
BUN: 11 mg/dL (ref 6–20)
CALCIUM: 11.3 mg/dL — AB (ref 8.9–10.3)
CO2: 25 mmol/L (ref 22–32)
CREATININE: 0.65 mg/dL (ref 0.44–1.00)
Chloride: 110 mmol/L (ref 101–111)
GFR calc Af Amer: >60 mL/min (ref >60)
GFR calc non Af Amer: >60 mL/min (ref >60)
GLUCOSE: 130 mg/dL — AB (ref 65–99)
Potassium: 4.1 mmol/L (ref 3.5–5.1)
SODIUM: 142 mmol/L (ref 135–145)
Total Bilirubin: 1.1 mg/dL (ref 0.3–1.2)
Total Protein: 7.5 g/dL (ref 6.5–8.1)

## 2016-09-25 LAB — URINALYSIS, ROUTINE W REFLEX MICROSCOPIC
Bacteria, UA: NONE SEEN
Bilirubin Urine: NEGATIVE
GLUCOSE, UA: NEGATIVE mg/dL
KETONES UR: NEGATIVE mg/dL
Leukocytes, UA: NEGATIVE
NITRITE: NEGATIVE
PH: 6 (ref 5.0–8.0)
Protein, ur: 30 mg/dL — AB
SPECIFIC GRAVITY, URINE: 1.019 (ref 1.005–1.030)

## 2016-09-25 LAB — CBC WITH DIFFERENTIAL/PLATELET
Basophils Absolute: 0 10*3/uL (ref 0.0–0.1)
Basophils Relative: 0 %
EOS PCT: 0 %
Eosinophils Absolute: 0 10*3/uL (ref 0.0–0.7)
HCT: 31 % — ABNORMAL LOW (ref 36.0–46.0)
Hemoglobin: 9.9 g/dL — ABNORMAL LOW (ref 12.0–15.0)
LYMPHS PCT: 16 %
Lymphs Abs: 2.4 10*3/uL (ref 0.7–4.0)
MCH: 29.1 pg (ref 26.0–34.0)
MCHC: 31.9 g/dL (ref 30.0–36.0)
MCV: 91.2 fL (ref 78.0–100.0)
MONO ABS: 1.7 10*3/uL — AB (ref 0.1–1.0)
Monocytes Relative: 11 %
Neutro Abs: 10.7 10*3/uL — ABNORMAL HIGH (ref 1.7–7.7)
Neutrophils Relative %: 73 %
PLATELETS: 308 10*3/uL (ref 150–400)
RBC: 3.4 MIL/uL — AB (ref 3.87–5.11)
RDW: 16.4 % — AB (ref 11.5–15.5)
WBC: 14.8 10*3/uL — ABNORMAL HIGH (ref 4.0–10.5)

## 2016-09-25 LAB — PROTIME-INR
INR: 1.13
Prothrombin Time: 14.6 seconds (ref 11.4–15.2)

## 2016-09-25 LAB — I-STAT CG4 LACTIC ACID, ED: Lactic Acid, Venous: 4 mmol/L (ref 0.5–1.9)

## 2016-09-25 LAB — BRAIN NATRIURETIC PEPTIDE: B Natriuretic Peptide: 105.5 pg/mL — ABNORMAL HIGH (ref 0.0–100.0)

## 2016-09-25 MED ORDER — PIPERACILLIN-TAZOBACTAM 3.375 G IVPB
3.3750 g | Freq: Once | INTRAVENOUS | Status: AC
  Administered 2016-09-25: 3.375 g via INTRAVENOUS
  Filled 2016-09-25: qty 50

## 2016-09-25 MED ORDER — IOPAMIDOL (ISOVUE-300) INJECTION 61%
100.0000 mL | Freq: Once | INTRAVENOUS | Status: AC | PRN
  Administered 2016-09-25: 100 mL via INTRAVENOUS

## 2016-09-25 MED ORDER — IOPAMIDOL (ISOVUE-300) INJECTION 61%
INTRAVENOUS | Status: AC
  Administered 2016-09-25: 100 mL via INTRAVENOUS
  Filled 2016-09-25: qty 100

## 2016-09-25 MED ORDER — SODIUM CHLORIDE 0.9 % IV BOLUS (SEPSIS)
1000.0000 mL | Freq: Once | INTRAVENOUS | Status: AC
Start: 2016-09-25 — End: 2016-09-25
  Administered 2016-09-25: 1000 mL via INTRAVENOUS

## 2016-09-25 MED ORDER — VANCOMYCIN HCL IN DEXTROSE 1-5 GM/200ML-% IV SOLN
1000.0000 mg | Freq: Once | INTRAVENOUS | Status: AC
  Administered 2016-09-25: 1000 mg via INTRAVENOUS
  Filled 2016-09-25: qty 200

## 2016-09-25 NOTE — ED Provider Notes (Signed)
[facility] DEPT Provider Note   CSN: [id number] Arrival date & time: 09/23/2016  2129     History   Chief Complaint Chief Complaint  Patient presents with  . Abdominal Pain    HPI Judith Stephens is a 76 y.o. female.  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 She presents with 3 days of generalized weakness, vomiting, diarrhea, abdominal pain and shortness of breath. Son states the patient has been coughing up small amount of sputum. Has been increasingly drowsy. Denied blood in the vomit or stool. Past Medical History:  Diagnosis Date  . Anemia    hx of blood transfusion 03/06/12   . Anxiety   . Arthritis   . Blood transfusion   . Cancer [facility])    colon cancer  . Diabetes mellitus   . Hyperlipidemia   . Hypertension   . Hypothyroidism     Patient Active Problem List   Diagnosis Date Noted  . Abdominal pain 09/26/2016  . Nausea, vomiting and diarrhea 09/26/2016  . Cough 09/26/2016  . Healthcare-associated pneumonia 04/14/2016  . Pneumonia 04/13/2016  . Sepsis ([place]) 04/13/2016  . Hypercalcemia 04/13/2016  . Hypothyroidism 04/13/2016  . Severe protein-calorie malnutrition ([place]) 03/17/2016  . Syncope 03/16/2016  . Port catheter in place 03/14/2016  . Liver metastasis ([place]) 01/13/2016  . Lung metastasis ([place]) 01/13/2016  . Sepsis, unspecified organism ([place]) 01/09/2016  . Incisional hernia 03/08/2012  . Normocytic anemia 03/06/2012  . Colon cancer ([place]) 03/06/2012  . Diabetes mellitus type II, non insulin dependent ([place]) 03/06/2012  . HTN (hypertension) 03/06/2012    Past Surgical History:  Procedure Laterality Date  . COLON SURGERY  2013  . cyst on ovary Right    removed  . LAPAROSCOPY  03/14/2012   Procedure: LAPAROSCOPY DIAGNOSTIC;  Surgeon: Adin Hector, MD;  Location: [facility];  Service: General;;  right colectomy  . PORTACATH PLACEMENT N/A 01/31/2016   Procedure: INSERTION PORT-A-CATH WITH ULTRA SOUND;  Surgeon: Fanny Skates, MD;  Location: [place];  Service: General;  Laterality: N/A;  . TUBAL LIGATION      OB History    No data available       Home Medications    Prior to Admission medications   Medication Sig Start Date End Date Taking? Authorizing Provider  amLODipine (NORVASC) 10 MG tablet Take 10 mg by mouth daily. 08/21/16  Yes Historical Provider, MD  aspirin EC 81 MG tablet Take 81 mg by mouth daily.    Yes Historical Provider, MD  feeding supplement, ENSURE ENLIVE, (ENSURE ENLIVE) LIQD Take 237 mLs by mouth 2 (two) times daily between meals. 03/19/16  Yes Hosie Poisson, MD  fluticasone (FLONASE) 50 MCG/ACT nasal spray Place 2 sprays into the nose daily as needed for rhinitis. For allergies   Yes Historical Provider, MD  glipiZIDE (GLUCOTROL XL) 10 MG 24 hr tablet Take 10 mg by mouth daily. 07/20/16  Yes Historical Provider, MD  HYDROcodone-acetaminophen (NORCO/VICODIN) 5-325 MG tablet Take 1 tablet by mouth every 4 (four) hours as needed for moderate pain or severe pain. 06/27/16  Yes Owens Shark, NP  KLOR-CON M20 20 MEQ tablet Take 20 mEq by mouth daily. 06/20/16  Yes Historical Provider, MD  levothyroxine (SYNTHROID, LEVOTHROID) 175 MCG tablet Take 175 mcg by mouth daily. 09/16/16  Yes Historical Provider, MD  lidocaine-prilocaine (EMLA) cream Apply 1 application topically as needed. Apply 1 hr prior to port access and cover with plastic wrap 07/11/16  Yes  Owens Shark, NP  loratadine (CLARITIN) 10 MG tablet Take 10 mg by mouth daily as needed for allergies.    Yes Historical Provider, MD  LORazepam (ATIVAN) 0.5 MG tablet Take 1 tablet (0.5 mg total) by mouth at bedtime as needed for anxiety or sleep. 05/16/16  Yes Ladell Pier, MD  montelukast (SINGULAIR) 10 MG tablet TAKE 1 TABLET BY MOUTH EVERY EVENING 05/10/16  Yes Ladell Pier, MD  rosuvastatin (CRESTOR) 10 MG tablet Take 5 mg by mouth daily. 07/19/16  Yes Historical Provider, MD  trifluridine-tipiracil (LONSURF) 20-8.19 MG tablet Take 3  tablets (60 mg of trifluridine total) by mouth 2 (two) times daily after a meal. 1 hr after meals on days 1-5, 8-12, every 28days 09/08/16  Yes Ladell Pier, MD  ondansetron (ZOFRAN) 8 MG tablet Take 1 tablet (8 mg total) by mouth every 8 (eight) hours as needed for nausea or vomiting. Patient not taking: Reported on 09/05/2016 08/28/16   Ladell Pier, MD  prochlorperazine (COMPAZINE) 5 MG tablet Take 1 tablet (5 mg total) by mouth every 6 (six) hours as needed for nausea or vomiting. Patient not taking: Reported on 09/05/2016 06/27/16   Owens Shark, NP    Family History Family History  Problem Relation Age of Onset  . Hypertension Father   . Hypertension Sister     x3  . Hypertension Brother     Social History Social History  Substance Use Topics  . Smoking status: Former Smoker    Packs/day: 0.25    Types: Cigarettes  . Smokeless tobacco: Never Used  . Alcohol use No     Comment: occasional wine      Allergies   No known allergies   Review of Systems Review of Systems  Constitutional: Positive for activity change, appetite change, chills, fatigue and fever.  Respiratory: Positive for cough and shortness of breath.   Cardiovascular: Negative for chest pain.  Gastrointestinal: Positive for abdominal distention, abdominal pain, diarrhea, nausea and vomiting.  Genitourinary: Negative for dysuria, flank pain, frequency and hematuria.  Musculoskeletal: Negative for back pain, neck pain and neck stiffness.  Skin: Negative for rash and wound.  Neurological: Positive for weakness (generalized). Negative for dizziness, light-headedness, numbness and headaches.  Psychiatric/Behavioral: Positive for confusion.  All other systems reviewed and are negative.    Physical Exam Updated Vital Signs BP (!) 147/76   Pulse (!) 117   Temp (!) 100.5 F (38.1 C) (Oral)   Resp (!) 28   SpO2 96%   Physical Exam  Constitutional: She appears well-developed and well-nourished.  Drowsy  but easily aroused  HENT:  Head: Normocephalic and atraumatic.  Mouth/Throat: Oropharynx is clear and moist. No oropharyngeal exudate.  Eyes: EOM are normal. Pupils are equal, round, and reactive to light.  Neck: Normal range of motion. Neck supple.  Cardiovascular: Regular rhythm.  Exam reveals no gallop and no friction rub.   No murmur heard. Tachycardia  Pulmonary/Chest: Effort normal and breath sounds normal.  Crackles in bilateral bases  Abdominal: Soft. Bowel sounds are normal. She exhibits distension and mass. There is tenderness. There is no rebound and no guarding.  Patient tender to palpation over the right upper quadrant. Mild distention throughout. Appreciated prominent liver and right upper quadrant.  Musculoskeletal: Normal range of motion. She exhibits edema. She exhibits no tenderness.  1+ bilateral lower extremity pitting edema.  Lymphadenopathy:    She has no cervical adenopathy.  Neurological:  Generalized weakness. No focal deficits  or numbness.  Skin: Skin is warm and dry. No rash noted. No erythema.  Psychiatric: She has a normal mood and affect. Her behavior is normal.  Nursing note and vitals reviewed.    ED Treatments / Results  Labs (all labs ordered are listed, but only abnormal results are displayed) Labs Reviewed  COMPREHENSIVE METABOLIC PANEL - Abnormal; Notable for the following:       Result Value   Glucose, Bld 130 (*)    Calcium 11.3 (*)    Albumin 2.8 (*)    AST 113 (*)    Alkaline Phosphatase 194 (*)    All other components within normal limits  CBC WITH DIFFERENTIAL/PLATELET - Abnormal; Notable for the following:    WBC 14.8 (*)    RBC 3.40 (*)    Hemoglobin 9.9 (*)    HCT 31.0 (*)    RDW 16.4 (*)    Neutro Abs 10.7 (*)    Monocytes Absolute 1.7 (*)    All other components within normal limits  URINALYSIS, ROUTINE W REFLEX MICROSCOPIC - Abnormal; Notable for the following:    Color, Urine AMBER (*)    APPearance HAZY (*)    Hgb  urine dipstick MODERATE (*)    Protein, ur 30 (*)    Squamous Epithelial / LPF 0-5 (*)    All other components within normal limits  BRAIN NATRIURETIC PEPTIDE - Abnormal; Notable for the following:    B Natriuretic Peptide 105.5 (*)    All other components within normal limits  I-STAT CG4 LACTIC ACID, ED - Abnormal; Notable for the following:    Lactic Acid, Venous 4.00 (*)    All other components within normal limits  CULTURE, BLOOD (ROUTINE X 2)  CULTURE, BLOOD (ROUTINE X 2)  PROTIME-INR  I-STAT CG4 LACTIC ACID, ED    EKG  EKG Interpretation  Date/Time:  Monday September 25 2016 21:43:13 EDT Ventricular Rate:  120 PR Interval:    QRS Duration: 77 QT Interval:  328 QTC Calculation: 464 R Axis:   -64 Text Interpretation:  Sinus tachycardia Left anterior fascicular block Low voltage, precordial leads Consider anterior infarct Confirmed by Lita Mains  MD, Lira Stephen ([id number]) on 09/26/2016 12:46:08 AM       Radiology Dg Chest 2 View  Result Date: 10/05/2016 CLINICAL DATA:  Acute onset of fever and generalized chest pain. Abdominal distention and generalized weakness. Initial encounter. EXAM: CHEST  2 VIEW COMPARISON:  CT of the chest performed 09/04/2016 FINDINGS: The lungs are hypoexpanded. Vascular crowding and vascular congestion are seen. Bilateral pulmonary nodules are again noted throughout the lungs, reflecting known metastatic disease. Underlying airspace opacification cannot be excluded. A small right pleural effusion is again noted. No pneumothorax is seen. The heart is normal in size; the mediastinal contour is within normal limits. A left-sided chest port is noted ending about the mid to distal SVC. No acute osseous abnormalities are seen. IMPRESSION: Lungs hypoexpanded. Vascular congestion noted. Bilateral pulmonary nodules again noted throughout the lungs, reflecting known metastatic disease. Underlying pneumonia cannot be excluded, given the patient's symptoms. Small right pleural  effusion again noted. Electronically Signed   By: Garald Balding M.D.   On: 10/02/2016 22:08   Ct Chest W Contrast  Result Date: 09/26/2016 CLINICAL DATA:  Acute onset of generalized abdominal pain, distention and fever. Leukocytosis. Initial encounter. EXAM: CT CHEST, ABDOMEN, AND PELVIS WITH CONTRAST TECHNIQUE: Multidetector CT imaging of the chest, abdomen and pelvis was performed following the standard protocol during bolus administration of  intravenous contrast. CONTRAST:  100 mL of Isovue 300 IV contrast COMPARISON:  CT of abdomen and pelvis performed 09/04/2016 FINDINGS: CT CHEST FINDINGS Cardiovascular: The heart is normal in size. The thoracic aorta is unremarkable. No calcific atherosclerotic disease is seen. The great vessels are grossly unremarkable. Mediastinum/Nodes: An enlarged 1.6 cm subcarinal node is again noted. No additional mediastinal lymphadenopathy is appreciated. No pericardial effusion is identified. The visualized portions of the thyroid gland are unremarkable. No axillary lymphadenopathy is appreciated. A left-sided chest port is noted. Lungs/Pleura: A small right pleural effusion is noted. Diffuse interstitial prominence may reflect mild interstitial edema. There is diffuse enlargement of innumerable pulmonary metastases, measuring up to 2.0 cm in size. No pneumothorax is seen. Musculoskeletal: No acute osseous abnormalities are identified. The visualized musculature is unremarkable in appearance. CT ABDOMEN PELVIS FINDINGS Hepatobiliary: Innumerable masses are noted throughout the liver, reflecting metastatic disease. Stones are suggested at the gallbladder. The gallbladder is decompressed and not well assessed. The common bile duct remains normal in caliber. Small volume ascites is noted within the abdomen and pelvis, increased from the prior study. Pancreas: The pancreas is within normal limits. Spleen: The spleen is unremarkable in appearance. Adrenals/Urinary Tract: The adrenal  glands are unremarkable in appearance. The kidneys are within normal limits Scattered bilateral renal cysts are noted, larger on the right. A nonobstructing 7 mm stone is noted at the interpole region of the right kidney. There is no evidence of hydronephrosis. No obstructing ureteral stones are identified. Nonspecific perinephric stranding is noted bilaterally. Stomach/Bowel: The stomach is unremarkable in appearance. The small bowel is within normal limits. The patient is status post appendectomy. The patient's ileocolic anastomosis is grossly unremarkable. Scattered diverticulosis is noted along the proximal sigmoid colon, without evidence of diverticulitis. Vascular/Lymphatic: The abdominal aorta is unremarkable in appearance. The inferior vena cava is grossly unremarkable. No retroperitoneal lymphadenopathy is seen. No pelvic sidewall lymphadenopathy is identified. Reproductive: The bladder is mildly distended and grossly unremarkable. The uterus is grossly unremarkable. No suspicious adnexal masses are seen. Other: Small bilateral inguinal hernias are noted, containing only fat. Musculoskeletal: No acute osseous abnormalities are identified. The visualized musculature is unremarkable in appearance. IMPRESSION: 1. Small right pleural effusion again noted. Diffuse interstitial prominence may reflect mild interstitial edema. 2. Interval enlargement of innumerable pulmonary metastases, measuring up to 2.0 cm in size. 3. Innumerable masses again noted throughout the liver, reflecting metastatic disease. 4. Small volume ascites within the abdomen and pelvis is increased from the prior study. 5. Enlarged 1.6 cm subcarinal node noted. 6. Cholelithiasis suggested. 7. Scattered bilateral renal cysts. Nonobstructing 7 mm stone at the interpole region of the right kidney. 8. Scattered diverticulosis along the proximal sigmoid colon, without evidence of diverticulitis. 9. Small bilateral inguinal hernias, containing only  fat. Electronically Signed   By: Garald Balding M.D.   On: 09/26/2016 00:14   Ct Abdomen Pelvis W Contrast  Result Date: 09/26/2016 CLINICAL DATA:  Acute onset of generalized abdominal pain, distention and fever. Leukocytosis. Initial encounter. EXAM: CT CHEST, ABDOMEN, AND PELVIS WITH CONTRAST TECHNIQUE: Multidetector CT imaging of the chest, abdomen and pelvis was performed following the standard protocol during bolus administration of intravenous contrast. CONTRAST:  100 mL of Isovue 300 IV contrast COMPARISON:  CT of abdomen and pelvis performed 09/04/2016 FINDINGS: CT CHEST FINDINGS Cardiovascular: The heart is normal in size. The thoracic aorta is unremarkable. No calcific atherosclerotic disease is seen. The great vessels are grossly unremarkable. Mediastinum/Nodes: An enlarged 1.6 cm subcarinal node  is again noted. No additional mediastinal lymphadenopathy is appreciated. No pericardial effusion is identified. The visualized portions of the thyroid gland are unremarkable. No axillary lymphadenopathy is appreciated. A left-sided chest port is noted. Lungs/Pleura: A small right pleural effusion is noted. Diffuse interstitial prominence may reflect mild interstitial edema. There is diffuse enlargement of innumerable pulmonary metastases, measuring up to 2.0 cm in size. No pneumothorax is seen. Musculoskeletal: No acute osseous abnormalities are identified. The visualized musculature is unremarkable in appearance. CT ABDOMEN PELVIS FINDINGS Hepatobiliary: Innumerable masses are noted throughout the liver, reflecting metastatic disease. Stones are suggested at the gallbladder. The gallbladder is decompressed and not well assessed. The common bile duct remains normal in caliber. Small volume ascites is noted within the abdomen and pelvis, increased from the prior study. Pancreas: The pancreas is within normal limits. Spleen: The spleen is unremarkable in appearance. Adrenals/Urinary Tract: The adrenal glands  are unremarkable in appearance. The kidneys are within normal limits Scattered bilateral renal cysts are noted, larger on the right. A nonobstructing 7 mm stone is noted at the interpole region of the right kidney. There is no evidence of hydronephrosis. No obstructing ureteral stones are identified. Nonspecific perinephric stranding is noted bilaterally. Stomach/Bowel: The stomach is unremarkable in appearance. The small bowel is within normal limits. The patient is status post appendectomy. The patient's ileocolic anastomosis is grossly unremarkable. Scattered diverticulosis is noted along the proximal sigmoid colon, without evidence of diverticulitis. Vascular/Lymphatic: The abdominal aorta is unremarkable in appearance. The inferior vena cava is grossly unremarkable. No retroperitoneal lymphadenopathy is seen. No pelvic sidewall lymphadenopathy is identified. Reproductive: The bladder is mildly distended and grossly unremarkable. The uterus is grossly unremarkable. No suspicious adnexal masses are seen. Other: Small bilateral inguinal hernias are noted, containing only fat. Musculoskeletal: No acute osseous abnormalities are identified. The visualized musculature is unremarkable in appearance. IMPRESSION: 1. Small right pleural effusion again noted. Diffuse interstitial prominence may reflect mild interstitial edema. 2. Interval enlargement of innumerable pulmonary metastases, measuring up to 2.0 cm in size. 3. Innumerable masses again noted throughout the liver, reflecting metastatic disease. 4. Small volume ascites within the abdomen and pelvis is increased from the prior study. 5. Enlarged 1.6 cm subcarinal node noted. 6. Cholelithiasis suggested. 7. Scattered bilateral renal cysts. Nonobstructing 7 mm stone at the interpole region of the right kidney. 8. Scattered diverticulosis along the proximal sigmoid colon, without evidence of diverticulitis. 9. Small bilateral inguinal hernias, containing only fat.  Electronically Signed   By: Garald Balding M.D.   On: 09/26/2016 00:14    Procedures Procedures (including critical care time)  Medications Ordered in ED Medications  piperacillin-tazobactam (ZOSYN) IVPB 3.375 g (3.375 g Intravenous New Bag/Given 10/02/2016 2240)  sodium chloride 0.9 % bolus 1,000 mL (not administered)  vancomycin (VANCOCIN) IVPB 1000 mg/200 mL premix (1,000 mg Intravenous New Bag/Given 10/07/2016 2315)  sodium chloride 0.9 % bolus 1,000 mL (0 mLs Intravenous Stopped 09/23/2016 2300)  iopamidol (ISOVUE-300) 61 % injection 100 mL (100 mLs Intravenous Contrast Given 09/18/2016 2342)    CRITICAL CARE Performed by: Lita Mains, Neill Jurewicz Total critical care time: 35 minutes Critical care time was exclusive of separately billable procedures and treating other patients. Critical care was necessary to treat or prevent imminent or life-threatening deterioration. Critical care was time spent personally by me on the following activities: development of treatment plan with patient and/or surrogate as well as nursing, discussions with consultants, evaluation of patient's response to treatment, examination of patient, obtaining history from patient or surrogate,  ordering and performing treatments and interventions, ordering and review of laboratory studies, ordering and review of radiographic studies, pulse oximetry and re-evaluation of patient's condition. Initial Impression / Assessment and Plan / ED Course  I have reviewed the triage vital signs and the nursing notes.  Pertinent labs & imaging results that were available during my care of the patient were reviewed by me and considered in my medical decision making (see chart for details).     Patient has advanced metastatic cancer and is DNR. Her blood pressure is stable. She has evidence of severe sepsis. She also has evidence of fluid overload. Will be judicious in giving IV fluids. Heart rate is improved. Blood pressure remained stable. Patient is  more alert. CT chest and abdomen and pelvis without definite source of sepsis. Discussed with hospitalist and we'll see patient in the emergency department and admit. Final Clinical Impressions(s) / ED Diagnoses   Final diagnoses:  Sepsis due to undetermined organism [facility])    New Prescriptions New Prescriptions   No medications on file     Julianne Rice, MD 09/26/16 [phone]

## 2016-09-25 NOTE — ED Triage Notes (Signed)
Pt states she has had generalized abd pain and abd distention since last Thursday. Hx of Liver CA. Started trifluridine recently which may have something to do with pain. From home. Lives w/ husband. Confusion. Febrile with EMS. Alert.

## 2016-09-25 NOTE — ED Notes (Signed)
ED Provider at bedside. 

## 2016-09-25 NOTE — ED Notes (Signed)
Bed: IY64 Expected date:  Expected time:  Means of arrival:  Comments: EMS 76 yo female from home abdominal pain/constipation-fever HR 130

## 2016-09-25 NOTE — ED Notes (Signed)
Patient transported to CT 

## 2016-09-26 ENCOUNTER — Inpatient Hospital Stay (HOSPITAL_COMMUNITY): Payer: Medicare Other

## 2016-09-26 ENCOUNTER — Encounter (HOSPITAL_COMMUNITY): Payer: Self-pay

## 2016-09-26 DIAGNOSIS — A419 Sepsis, unspecified organism: Secondary | ICD-10-CM | POA: Diagnosis not present

## 2016-09-26 DIAGNOSIS — R918 Other nonspecific abnormal finding of lung field: Secondary | ICD-10-CM | POA: Diagnosis not present

## 2016-09-26 DIAGNOSIS — Z7189 Other specified counseling: Secondary | ICD-10-CM | POA: Diagnosis not present

## 2016-09-26 DIAGNOSIS — R109 Unspecified abdominal pain: Secondary | ICD-10-CM | POA: Diagnosis present

## 2016-09-26 DIAGNOSIS — G9340 Encephalopathy, unspecified: Secondary | ICD-10-CM

## 2016-09-26 DIAGNOSIS — E039 Hypothyroidism, unspecified: Secondary | ICD-10-CM | POA: Diagnosis not present

## 2016-09-26 DIAGNOSIS — R509 Fever, unspecified: Secondary | ICD-10-CM | POA: Diagnosis not present

## 2016-09-26 DIAGNOSIS — R1030 Lower abdominal pain, unspecified: Secondary | ICD-10-CM | POA: Diagnosis not present

## 2016-09-26 DIAGNOSIS — I5033 Acute on chronic diastolic (congestive) heart failure: Secondary | ICD-10-CM | POA: Diagnosis present

## 2016-09-26 DIAGNOSIS — N179 Acute kidney failure, unspecified: Secondary | ICD-10-CM | POA: Diagnosis not present

## 2016-09-26 DIAGNOSIS — R1084 Generalized abdominal pain: Secondary | ICD-10-CM | POA: Diagnosis not present

## 2016-09-26 DIAGNOSIS — R0600 Dyspnea, unspecified: Secondary | ICD-10-CM | POA: Diagnosis not present

## 2016-09-26 DIAGNOSIS — Z85038 Personal history of other malignant neoplasm of large intestine: Secondary | ICD-10-CM | POA: Diagnosis not present

## 2016-09-26 DIAGNOSIS — R197 Diarrhea, unspecified: Secondary | ICD-10-CM | POA: Diagnosis not present

## 2016-09-26 DIAGNOSIS — K5730 Diverticulosis of large intestine without perforation or abscess without bleeding: Secondary | ICD-10-CM | POA: Diagnosis present

## 2016-09-26 DIAGNOSIS — F419 Anxiety disorder, unspecified: Secondary | ICD-10-CM | POA: Diagnosis present

## 2016-09-26 DIAGNOSIS — C7800 Secondary malignant neoplasm of unspecified lung: Secondary | ICD-10-CM | POA: Diagnosis not present

## 2016-09-26 DIAGNOSIS — C7802 Secondary malignant neoplasm of left lung: Secondary | ICD-10-CM | POA: Diagnosis present

## 2016-09-26 DIAGNOSIS — C787 Secondary malignant neoplasm of liver and intrahepatic bile duct: Secondary | ICD-10-CM | POA: Diagnosis not present

## 2016-09-26 DIAGNOSIS — Z515 Encounter for palliative care: Secondary | ICD-10-CM | POA: Diagnosis not present

## 2016-09-26 DIAGNOSIS — R6520 Severe sepsis without septic shock: Secondary | ICD-10-CM | POA: Diagnosis present

## 2016-09-26 DIAGNOSIS — Z66 Do not resuscitate: Secondary | ICD-10-CM | POA: Diagnosis present

## 2016-09-26 DIAGNOSIS — E785 Hyperlipidemia, unspecified: Secondary | ICD-10-CM | POA: Diagnosis present

## 2016-09-26 DIAGNOSIS — R05 Cough: Secondary | ICD-10-CM | POA: Diagnosis present

## 2016-09-26 DIAGNOSIS — R112 Nausea with vomiting, unspecified: Secondary | ICD-10-CM | POA: Diagnosis not present

## 2016-09-26 DIAGNOSIS — I471 Supraventricular tachycardia: Secondary | ICD-10-CM | POA: Diagnosis not present

## 2016-09-26 DIAGNOSIS — D6481 Anemia due to antineoplastic chemotherapy: Secondary | ICD-10-CM | POA: Diagnosis not present

## 2016-09-26 DIAGNOSIS — Z87891 Personal history of nicotine dependence: Secondary | ICD-10-CM | POA: Diagnosis not present

## 2016-09-26 DIAGNOSIS — E119 Type 2 diabetes mellitus without complications: Secondary | ICD-10-CM | POA: Diagnosis not present

## 2016-09-26 DIAGNOSIS — C189 Malignant neoplasm of colon, unspecified: Secondary | ICD-10-CM | POA: Diagnosis not present

## 2016-09-26 DIAGNOSIS — I10 Essential (primary) hypertension: Secondary | ICD-10-CM | POA: Diagnosis not present

## 2016-09-26 DIAGNOSIS — C7801 Secondary malignant neoplasm of right lung: Secondary | ICD-10-CM | POA: Diagnosis present

## 2016-09-26 DIAGNOSIS — R4182 Altered mental status, unspecified: Secondary | ICD-10-CM | POA: Diagnosis present

## 2016-09-26 DIAGNOSIS — E7220 Disorder of urea cycle metabolism, unspecified: Secondary | ICD-10-CM | POA: Diagnosis not present

## 2016-09-26 DIAGNOSIS — G9341 Metabolic encephalopathy: Secondary | ICD-10-CM | POA: Diagnosis present

## 2016-09-26 DIAGNOSIS — R188 Other ascites: Secondary | ICD-10-CM | POA: Diagnosis present

## 2016-09-26 DIAGNOSIS — I509 Heart failure, unspecified: Secondary | ICD-10-CM | POA: Diagnosis not present

## 2016-09-26 DIAGNOSIS — E46 Unspecified protein-calorie malnutrition: Secondary | ICD-10-CM | POA: Diagnosis not present

## 2016-09-26 DIAGNOSIS — D649 Anemia, unspecified: Secondary | ICD-10-CM

## 2016-09-26 DIAGNOSIS — I110 Hypertensive heart disease with heart failure: Secondary | ICD-10-CM | POA: Diagnosis present

## 2016-09-26 DIAGNOSIS — R059 Cough, unspecified: Secondary | ICD-10-CM | POA: Diagnosis present

## 2016-09-26 LAB — CBC
HEMATOCRIT: 29.4 % — AB (ref 36.0–46.0)
Hemoglobin: 9.1 g/dL — ABNORMAL LOW (ref 12.0–15.0)
MCH: 27.7 pg (ref 26.0–34.0)
MCHC: 31 g/dL (ref 30.0–36.0)
MCV: 89.6 fL (ref 78.0–100.0)
PLATELETS: 264 10*3/uL (ref 150–400)
RBC: 3.28 MIL/uL — AB (ref 3.87–5.11)
RDW: 16.3 % — ABNORMAL HIGH (ref 11.5–15.5)
WBC: 13.5 10*3/uL — AB (ref 4.0–10.5)

## 2016-09-26 LAB — BASIC METABOLIC PANEL
Anion gap: 7 (ref 5–15)
BUN: 10 mg/dL (ref 6–20)
CHLORIDE: 111 mmol/L (ref 101–111)
CO2: 24 mmol/L (ref 22–32)
CREATININE: 0.57 mg/dL (ref 0.44–1.00)
Calcium: 10.8 mg/dL — ABNORMAL HIGH (ref 8.9–10.3)
GFR calc non Af Amer: >60 mL/min (ref >60)
Glucose, Bld: 98 mg/dL (ref 65–99)
POTASSIUM: 3.9 mmol/L (ref 3.5–5.1)
SODIUM: 142 mmol/L (ref 135–145)

## 2016-09-26 LAB — LACTIC ACID, PLASMA
Lactic Acid, Venous: 2.6 mmol/L (ref 0.5–1.9)
Lactic Acid, Venous: 2.4 mmol/L (ref 0.5–1.9)

## 2016-09-26 LAB — AMMONIA: Ammonia: 52 umol/L — ABNORMAL HIGH (ref 9–35)

## 2016-09-26 LAB — PROCALCITONIN: Procalcitonin: 0.54 ng/mL

## 2016-09-26 LAB — INFLUENZA PANEL BY PCR (TYPE A & B)
INFLAPCR: NEGATIVE
INFLBPCR: NEGATIVE

## 2016-09-26 MED ORDER — VANCOMYCIN HCL IN DEXTROSE 1-5 GM/200ML-% IV SOLN
1000.0000 mg | Freq: Two times a day (BID) | INTRAVENOUS | Status: DC
  Administered 2016-09-26 – 2016-09-28 (×6): 1000 mg via INTRAVENOUS
  Filled 2016-09-26 (×6): qty 200

## 2016-09-26 MED ORDER — SODIUM CHLORIDE 0.9% FLUSH
10.0000 mL | INTRAVENOUS | Status: DC | PRN
  Administered 2016-09-27: 10 mL
  Filled 2016-09-26: qty 40

## 2016-09-26 MED ORDER — LACTULOSE 10 GM/15ML PO SOLN
30.0000 g | Freq: Two times a day (BID) | ORAL | Status: DC
  Administered 2016-09-26 – 2016-09-27 (×4): 30 g via ORAL
  Filled 2016-09-26 (×5): qty 45

## 2016-09-26 MED ORDER — LEVALBUTEROL HCL 1.25 MG/0.5ML IN NEBU
1.2500 mg | INHALATION_SOLUTION | Freq: Four times a day (QID) | RESPIRATORY_TRACT | Status: DC
  Administered 2016-09-26: 1.25 mg via RESPIRATORY_TRACT
  Filled 2016-09-26 (×2): qty 0.5

## 2016-09-26 MED ORDER — LEVALBUTEROL HCL 0.63 MG/3ML IN NEBU
0.6300 mg | INHALATION_SOLUTION | RESPIRATORY_TRACT | Status: DC | PRN
  Administered 2016-09-28: 0.63 mg via RESPIRATORY_TRACT
  Filled 2016-09-26: qty 3

## 2016-09-26 MED ORDER — SODIUM CHLORIDE 0.9 % IV BOLUS (SEPSIS)
500.0000 mL | Freq: Once | INTRAVENOUS | Status: AC
  Administered 2016-09-26: 500 mL via INTRAVENOUS

## 2016-09-26 MED ORDER — ACETAMINOPHEN 650 MG RE SUPP: 650.0000 mg | Freq: Four times a day (QID) | RECTAL | Status: DC | PRN

## 2016-09-26 MED ORDER — SODIUM CHLORIDE 0.9 % IV SOLN
INTRAVENOUS | Status: DC
  Administered 2016-09-26: 04:00:00 via INTRAVENOUS

## 2016-09-26 MED ORDER — ENOXAPARIN SODIUM 40 MG/0.4ML ~~LOC~~ SOLN
40.0000 mg | SUBCUTANEOUS | Status: DC
  Administered 2016-09-26 – 2016-09-30 (×5): 40 mg via SUBCUTANEOUS
  Filled 2016-09-26 (×5): qty 0.4

## 2016-09-26 MED ORDER — LEVALBUTEROL HCL 1.25 MG/0.5ML IN NEBU
1.2500 mg | INHALATION_SOLUTION | Freq: Three times a day (TID) | RESPIRATORY_TRACT | Status: DC
  Administered 2016-09-26 – 2016-09-27 (×4): 1.25 mg via RESPIRATORY_TRACT
  Filled 2016-09-26 (×6): qty 0.5

## 2016-09-26 MED ORDER — HYDRALAZINE HCL 20 MG/ML IJ SOLN
5.0000 mg | INTRAMUSCULAR | Status: DC | PRN
  Administered 2016-09-27 – 2016-09-28 (×3): 5 mg via INTRAVENOUS
  Filled 2016-09-26 (×3): qty 1

## 2016-09-26 MED ORDER — LEVOTHYROXINE SODIUM 100 MCG IV SOLR
75.0000 ug | Freq: Every day | INTRAVENOUS | Status: DC
  Administered 2016-09-26 – 2016-09-30 (×5): 75 ug via INTRAVENOUS
  Filled 2016-09-26 (×5): qty 5

## 2016-09-26 MED ORDER — SODIUM CHLORIDE 0.9 % IV BOLUS (SEPSIS): 1000.0000 mL | Freq: Once | INTRAVENOUS | Status: DC

## 2016-09-26 MED ORDER — ACETAMINOPHEN 325 MG PO TABS
650.0000 mg | ORAL_TABLET | Freq: Four times a day (QID) | ORAL | Status: DC | PRN
  Administered 2016-09-26 – 2016-09-28 (×2): 650 mg via ORAL
  Filled 2016-09-26 (×2): qty 2

## 2016-09-26 MED ORDER — METRONIDAZOLE IN NACL 5-0.79 MG/ML-% IV SOLN
500.0000 mg | Freq: Once | INTRAVENOUS | Status: AC
  Administered 2016-09-26: 500 mg via INTRAVENOUS
  Filled 2016-09-26: qty 100

## 2016-09-26 MED ORDER — ONDANSETRON HCL 4 MG/2ML IJ SOLN: 4.0000 mg | Freq: Three times a day (TID) | INTRAMUSCULAR | Status: DC | PRN

## 2016-09-26 MED ORDER — DEXTROSE 5 % IV SOLN
500.0000 mg | Freq: Every day | INTRAVENOUS | Status: DC
  Administered 2016-09-26: 500 mg via INTRAVENOUS
  Filled 2016-09-26: qty 500

## 2016-09-26 MED ORDER — PIPERACILLIN-TAZOBACTAM 3.375 G IVPB
3.3750 g | Freq: Three times a day (TID) | INTRAVENOUS | Status: DC
  Administered 2016-09-26 – 2016-09-29 (×11): 3.375 g via INTRAVENOUS
  Filled 2016-09-26 (×15): qty 50

## 2016-09-26 NOTE — Progress Notes (Signed)
Pt admitted to Rm 1505 from ED. Unable to complete admission history. No family present and patient is alert to self and cannot answer questions.

## 2016-09-26 NOTE — Progress Notes (Signed)
Pharmacy Antibiotic Note  Kortne All is a 76 y.o. female admitted on 10/11/2016 with sepsis.  Pharmacy has been consulted for Vancomycin and Zosyn dosing.  Plan: Vancomycin 1gm IV every 12 hours.  Goal trough 15-20 mcg/mL. Zosyn 3.375g IV q8h (4 hour infusion).  Weight: 173 lb 8 oz (78.7 kg)  Temp (24hrs), Avg:100.2 F (37.9 C), Min:98.8 F (37.1 C), Max:101.3 F (38.5 C)   Recent Labs Lab 10/02/2016 2147 10/09/2016 2200 09/26/16 0235  WBC 14.8*  --   --   CREATININE 0.65  --   --   LATICACIDVEN  --  4.00* 2.6*    Estimated Creatinine Clearance: 67.9 mL/min (by C-G formula based on SCr of 0.65 mg/dL).    Allergies  Allergen Reactions  . No Known Allergies Other (See Comments)    Antimicrobials this admission: Vancomycin 09/25/2016 >> Zosyn 09/25/2016 >>  Azithromycin 4/9 >>  Dose adjustments this admission: -  Microbiology results: pending  Thank you for allowing pharmacy to be a part of this patient's care.  [name] 09/26/2016 6:02 AM

## 2016-09-26 NOTE — Evaluation (Signed)
Clinical/Bedside Swallow Evaluation Patient Details  Name: Judith Stephens MRN: [id number] Date of Birth: May 03, 1941  Today's Date: 09/26/2016 Time: SLP Start Time (ACUTE ONLY): 1135 SLP Stop Time (ACUTE ONLY): 1208 SLP Time Calculation (min) (ACUTE ONLY): 33 min  Past Medical History:  Past Medical History:  Diagnosis Date  . Anemia    hx of blood transfusion 03/06/12   . Anxiety   . Arthritis   . Blood transfusion   . Cancer [facility])    colon cancer  . Diabetes mellitus   . Hyperlipidemia   . Hypertension   . Hypothyroidism    Past Surgical History:  Past Surgical History:  Procedure Laterality Date  . COLON SURGERY  2013  . cyst on ovary Right    removed  . LAPAROSCOPY  03/14/2012   Procedure: LAPAROSCOPY DIAGNOSTIC;  Surgeon: Adin Hector, MD;  Location: [facility];  Service: General;;  right colectomy  . PORTACATH PLACEMENT N/A 01/31/2016   Procedure: INSERTION PORT-A-CATH WITH ULTRA SOUND;  Surgeon: Fanny Skates, MD;  Location: [facility];  Service: General;  Laterality: N/A;  . TUBAL LIGATION     HPI:  76 yo female adm to [facility] with AMS, weakness.  PMH + for colon cancer,    Assessment / Plan / Recommendation Clinical Impression  Pt presents wih generalized weakness and delayed responses to SLP - suspect due to encephalopathy.  No focal CN deficits and no severe dysphagia symptoms.  Observed pt consuming water via cup/straw, single bolus of applesauce and po medication with water.  Mild intermittent delaye noted but no indication of airway compromise.   Pt with protective exhalation postswallow.  SlP will follow up x1 due to pt's mental status and weakness.   Recommend avoiding cold items due to pt's chemo possibly negatively impacting tolerance.   SLP Visit Diagnosis: Dysphagia, oral phase (R13.11)    Aspiration Risk  Mild aspiration risk    Diet Recommendation Dysphagia 3 (Mech soft);Thin liquid   Liquid Administration via: Straw;Cup Medication Administration: Whole meds with  liquid Supervision: Patient able to self feed Compensations: Slow rate;Small sips/bites Postural Changes: Remain upright for at least 30 minutes after po intake;Seated upright at 90 degrees    Other  Recommendations Oral Care Recommendations: Oral care BID   Follow up Recommendations        Frequency and Duration min 1 x/week  1 week       Prognosis Prognosis for Safe Diet Advancement: Fair      Swallow Study   General Date of Onset: 09/26/16 HPI: 76 yo female adm to [facility] with AMS, weakness.  PMH + for colon cancer,  Type of Study: Bedside Swallow Evaluation Diet Prior to this Study: NPO Temperature Spikes Noted: Yes Respiratory Status: Room air Behavior/Cognition: Alert Oral Cavity Assessment: Dry;Dried secretions Oral Care Completed by SLP: Yes Oral Cavity - Dentition: Adequate natural dentition Vision: Functional for self-feeding Self-Feeding Abilities: Able to feed self Patient Positioning: Upright in bed Baseline Vocal Quality: Normal Volitional Cough: Strong Volitional Swallow: Able to elicit    Oral/Motor/Sensory Function Overall Oral Motor/Sensory Function: Generalized oral weakness   Ice Chips Ice chips: Not tested Other Comments: due to pt recently receiving chemo   Thin Liquid Thin Liquid: Impaired Presentation: Cup;Self Fed;Straw Pharyngeal  Phase Impairments: Suspected delayed Swallow Other Comments: mild delay but not indications of airway compromise    Nectar Thick Nectar Thick Liquid: Not tested   Honey Thick Honey Thick Liquid: Not tested   Puree Puree: Impaired Presentation: Self  Fed;Spoon Oral Phase Functional Implications: Prolonged oral transit Other Comments: pt reports displeasure with applesauce, only saw with single bolus due to concerns   Solid   GO   Solid: Not tested Other Comments: due to concern for dietary limitation        Luanna Salk, [place] [facility] SLP [phone]

## 2016-09-26 NOTE — H&P (Addendum)
History and Physical    Judith Stephens VQM:[id number] DOB: [birth year] DOA: 10/01/2016  Referring MD/NP/PA:   PCP: Donnie Coffin, MD   Patient coming from:  The patient is coming from home.  At baseline, pt is independent for most of ADL. SNF  Assistant living facility   [facility].       Chief Complaint: Generalized weakness, nausea, vomiting, diarrhea, abdominal pain, cough, shortness breath  HPI: Judith Stephens is a 76 y.o. female with medical history significant of metastasized colon cancer, hypertension, hyperlipidemia, diet-controlled diabetes, hypothyroidism, anxiety, anemia, who presents with generalized weakness, nausea, vomiting, diarrhea, abdominal pain, cough, shortness breath.  Patient has AMS and is unable to provide any medical history, therefore, most of the history is obtained by discussing the case with ED physician, per EMS report, and with the nursing staff. Per report, pt has been having 3 days of generalized weakness, vomiting, diarrhea, abdominal pain and shortness of breath. Per EDP, her son states the patient has been coughing up small amount of sputum. Has been increasingly drowsy and confusion. No blood in the vomit or stool.Started trifluridine recently. When I saw pt in ED, ps is confused, not oriented 3. No active cough, nausea, vomiting, diarrhea noted. Her abdomen is distended. She moves all extremities.  ED Course: pt was found to have WBC 14.8, lactate 4.00, INR 1.13, BNP 145.5, negative urinalysis, hemoglobin 9.9 which was 10.1 10/02/2016, electrolytes and renal function okay, temperature 11.3, tachycardia, tachypnea, oxygen saturation 93% on room air. Patient is admitted to telemetry bed as inpatient.  # CXR showed lungs hypoexpanded. Vascular congestion noted. Bilateral pulmonarynodules again noted throughout the lungs, reflecting knownmetastatic disease. Underlying pneumonia cannot be excluded, given the patient's symptoms. Small right pleural effusion again  noted.  # CT-chest and CT-abdomen/pelvis showed: 1. Small right pleural effusion again noted. Diffuse interstitial prominence may reflect mild interstitial edema. 2. Interval enlargement of innumerable pulmonary metastases, measuring up to 2.0 cm in size. 3. Innumerable masses again noted throughout the liver, reflecting metastatic disease. 4. Small volume ascites within the abdomen and pelvis is increased from the prior study. 5. Enlarged 1.6 cm subcarinal node noted. 6. Cholelithiasis suggested. 7. Scattered bilateral renal cysts. Nonobstructing 7 mm stone at the interpole region of the right kidney. 8. Scattered diverticulosis along the proximal sigmoid colon, without evidence of diverticulitis. 9. Small bilateral inguinal hernias, containing only fat.  Review of Systems: Could not be reviewed due to altered mental status.  Allergy:  Allergies  Allergen Reactions  . No Known Allergies Other (See Comments)    Past Medical History:  Diagnosis Date  . Anemia    hx of blood transfusion 03/06/12   . Anxiety   . Arthritis   . Blood transfusion   . Cancer [facility])    colon cancer  . Diabetes mellitus   . Hyperlipidemia   . Hypertension   . Hypothyroidism     Past Surgical History:  Procedure Laterality Date  . COLON SURGERY  2013  . cyst on ovary Right    removed  . LAPAROSCOPY  03/14/2012   Procedure: LAPAROSCOPY DIAGNOSTIC;  Surgeon: Adin Hector, MD;  Location: [facility];  Service: General;;  right colectomy  . PORTACATH PLACEMENT N/A 01/31/2016   Procedure: INSERTION PORT-A-CATH WITH ULTRA SOUND;  Surgeon: Fanny Skates, MD;  Location: [place];  Service: General;  Laterality: N/A;  . TUBAL LIGATION      Social History:  reports that she has quit smoking. Her smoking use included Cigarettes.  She smoked 0.25 packs per day. She has never used smokeless tobacco. She reports that she does not drink alcohol or use drugs.  Family History:  Family History  Problem Relation Age  of Onset  . Hypertension Father   . Hypertension Sister     x3  . Hypertension Brother      Prior to Admission medications   Medication Sig Start Date End Date Taking? Authorizing Provider  amLODipine (NORVASC) 10 MG tablet Take 10 mg by mouth daily. 08/21/16  Yes Historical Provider, MD  aspirin EC 81 MG tablet Take 81 mg by mouth daily.    Yes Historical Provider, MD  feeding supplement, ENSURE ENLIVE, (ENSURE ENLIVE) LIQD Take 237 mLs by mouth 2 (two) times daily between meals. 03/19/16  Yes Hosie Poisson, MD  fluticasone (FLONASE) 50 MCG/ACT nasal spray Place 2 sprays into the nose daily as needed for rhinitis. For allergies   Yes Historical Provider, MD  glipiZIDE (GLUCOTROL XL) 10 MG 24 hr tablet Take 10 mg by mouth daily. 07/20/16  Yes Historical Provider, MD  HYDROcodone-acetaminophen (NORCO/VICODIN) 5-325 MG tablet Take 1 tablet by mouth every 4 (four) hours as needed for moderate pain or severe pain. 06/27/16  Yes Owens Shark, NP  KLOR-CON M20 20 MEQ tablet Take 20 mEq by mouth daily. 06/20/16  Yes Historical Provider, MD  levothyroxine (SYNTHROID, LEVOTHROID) 175 MCG tablet Take 175 mcg by mouth daily. 09/16/16  Yes Historical Provider, MD  lidocaine-prilocaine (EMLA) cream Apply 1 application topically as needed. Apply 1 hr prior to port access and cover with plastic wrap 07/11/16  Yes Owens Shark, NP  loratadine (CLARITIN) 10 MG tablet Take 10 mg by mouth daily as needed for allergies.    Yes Historical Provider, MD  LORazepam (ATIVAN) 0.5 MG tablet Take 1 tablet (0.5 mg total) by mouth at bedtime as needed for anxiety or sleep. 05/16/16  Yes Ladell Pier, MD  montelukast (SINGULAIR) 10 MG tablet TAKE 1 TABLET BY MOUTH EVERY EVENING 05/10/16  Yes Ladell Pier, MD  rosuvastatin (CRESTOR) 10 MG tablet Take 5 mg by mouth daily. 07/19/16  Yes Historical Provider, MD  trifluridine-tipiracil (LONSURF) 20-8.19 MG tablet Take 3 tablets (60 mg of trifluridine total) by mouth 2 (two) times  daily after a meal. 1 hr after meals on days 1-5, 8-12, every 28days 09/08/16  Yes Ladell Pier, MD  ondansetron (ZOFRAN) 8 MG tablet Take 1 tablet (8 mg total) by mouth every 8 (eight) hours as needed for nausea or vomiting. Patient not taking: Reported on 09/05/2016 08/28/16   Ladell Pier, MD  prochlorperazine (COMPAZINE) 5 MG tablet Take 1 tablet (5 mg total) by mouth every 6 (six) hours as needed for nausea or vomiting. Patient not taking: Reported on 09/05/2016 06/27/16   Owens Shark, NP    Physical Exam: Vitals:   09/18/2016 2300 10/10/2016 2317 10/13/2016 2330 09/26/16 0216  BP: (!) 162/80  (!) 147/76 (!) 156/81  Pulse: (!) 116  (!) 117 (!) 102  Resp: (!) 28  (!) 28 (!) 22  Temp:  (!) 100.5 F (38.1 C)  98.8 F (37.1 C)  TempSrc:  Oral  Oral  SpO2: 93%  96% 95%   General: Not in acute distress HEENT:       Eyes: PERRL, EOMI, no scleral icterus.       ENT: No discharge from the ears and nose, no pharynx injection, no tonsillar enlargement.        Neck:  No JVD, no bruit, no mass felt. Heme: No neck lymph node enlargement. Cardiac: S1/S2, RRR, No murmurs, No gallops or rubs. Respiratory:  No rales, wheezing, rhonchi or rubs. GI: distended, nontender, no organomegaly, BS present. GU: No hematuria Ext: No pitting leg edema bilaterally. 2+DP/PT pulse bilaterally. Musculoskeletal: No joint deformities, No joint redness or warmth, no limitation of ROM in spin. Skin: No rashes.  Neuro: confused, not oriented X3, cranial nerves II-XII grossly intact, moves all extremities. Psych: Patient is not psychotic.  Labs on Admission: I have personally reviewed following labs and imaging studies  CBC:  Recent Labs Lab 10/05/2016 2147  WBC 14.8*  NEUTROABS 10.7*  HGB 9.9*  HCT 31.0*  MCV 91.2  PLT 448   Basic Metabolic Panel:  Recent Labs Lab 09/19/2016 2147  NA 142  K 4.1  CL 110  CO2 25  GLUCOSE 130*  BUN 11  CREATININE 0.65  CALCIUM 11.3*   GFR: CrCl cannot be calculated  (Unknown ideal weight.). Liver Function Tests:  Recent Labs Lab 10/12/2016 2147  AST 113*  ALT 20  ALKPHOS 194*  BILITOT 1.1  PROT 7.5  ALBUMIN 2.8*   No results for input(s): LIPASE, AMYLASE in the last 168 hours. No results for input(s): AMMONIA in the last 168 hours. Coagulation Profile:  Recent Labs Lab 10/15/2016 2147  INR 1.13   Cardiac Enzymes: No results for input(s): CKTOTAL, CKMB, CKMBINDEX, TROPONINI in the last 168 hours. BNP (last 3 results) No results for input(s): PROBNP in the last 8760 hours. HbA1C: No results for input(s): HGBA1C in the last 72 hours. CBG: No results for input(s): GLUCAP in the last 168 hours. Lipid Profile: No results for input(s): CHOL, HDL, LDLCALC, TRIG, CHOLHDL, LDLDIRECT in the last 72 hours. Thyroid Function Tests: No results for input(s): TSH, T4TOTAL, FREET4, T3FREE, THYROIDAB in the last 72 hours. Anemia Panel: No results for input(s): VITAMINB12, FOLATE, FERRITIN, TIBC, IRON, RETICCTPCT in the last 72 hours. Urine analysis:    Component Value Date/Time   COLORURINE AMBER (A) 10/13/2016 2145   APPEARANCEUR HAZY (A) 10/02/2016 2145   LABSPEC 1.019 09/18/2016 2145   PHURINE 6.0 09/24/2016 2145   GLUCOSEU NEGATIVE 10/09/2016 2145   HGBUR MODERATE (A) 09/27/2016 2145   BILIRUBINUR NEGATIVE 10/02/2016 2145   [place] NEGATIVE 10/12/2016 2145   PROTEINUR 30 (A) 10/10/2016 2145   NITRITE NEGATIVE 10/14/2016 2145   LEUKOCYTESUR NEGATIVE 10/07/2016 2145   Sepsis Labs: @LABRCNTIP (procalcitonin:4,lacticidven:4) ) Recent Results (from the past 240 hour(s))  Culture, blood (Routine x 2)     Status: None (Preliminary result)   Collection Time: 09/27/2016  9:47 PM  Result Value Ref Range Status   Specimen Description BLOOD LEFT FOREARM  Final   Special Requests   Final    BOTTLES DRAWN AEROBIC AND ANAEROBIC Blood Culture adequate volume Performed at [facility], [place] [address] [place], [place] [address]    Culture PENDING   Incomplete   Report Status PENDING  Incomplete     Radiological Exams on Admission: Dg Chest 2 View  Result Date: 09/26/2016 CLINICAL DATA:  Acute onset of fever and generalized chest pain. Abdominal distention and generalized weakness. Initial encounter. EXAM: CHEST  2 VIEW COMPARISON:  CT of the chest performed 09/04/2016 FINDINGS: The lungs are hypoexpanded. Vascular crowding and vascular congestion are seen. Bilateral pulmonary nodules are again noted throughout the lungs, reflecting known metastatic disease. Underlying airspace opacification cannot be excluded. A small right pleural effusion is again noted. No pneumothorax is  seen. The heart is normal in size; the mediastinal contour is within normal limits. A left-sided chest port is noted ending about the mid to distal SVC. No acute osseous abnormalities are seen. IMPRESSION: Lungs hypoexpanded. Vascular congestion noted. Bilateral pulmonary nodules again noted throughout the lungs, reflecting known metastatic disease. Underlying pneumonia cannot be excluded, given the patient's symptoms. Small right pleural effusion again noted. Electronically Signed   By: Garald Balding M.D.   On: 10/16/2016 22:08   Ct Chest W Contrast  Result Date: 09/26/2016 CLINICAL DATA:  Acute onset of generalized abdominal pain, distention and fever. Leukocytosis. Initial encounter. EXAM: CT CHEST, ABDOMEN, AND PELVIS WITH CONTRAST TECHNIQUE: Multidetector CT imaging of the chest, abdomen and pelvis was performed following the standard protocol during bolus administration of intravenous contrast. CONTRAST:  100 mL of Isovue 300 IV contrast COMPARISON:  CT of abdomen and pelvis performed 09/04/2016 FINDINGS: CT CHEST FINDINGS Cardiovascular: The heart is normal in size. The thoracic aorta is unremarkable. No calcific atherosclerotic disease is seen. The great vessels are grossly unremarkable. Mediastinum/Nodes: An enlarged 1.6 cm subcarinal node is again noted. No additional  mediastinal lymphadenopathy is appreciated. No pericardial effusion is identified. The visualized portions of the thyroid gland are unremarkable. No axillary lymphadenopathy is appreciated. A left-sided chest port is noted. Lungs/Pleura: A small right pleural effusion is noted. Diffuse interstitial prominence may reflect mild interstitial edema. There is diffuse enlargement of innumerable pulmonary metastases, measuring up to 2.0 cm in size. No pneumothorax is seen. Musculoskeletal: No acute osseous abnormalities are identified. The visualized musculature is unremarkable in appearance. CT ABDOMEN PELVIS FINDINGS Hepatobiliary: Innumerable masses are noted throughout the liver, reflecting metastatic disease. Stones are suggested at the gallbladder. The gallbladder is decompressed and not well assessed. The common bile duct remains normal in caliber. Small volume ascites is noted within the abdomen and pelvis, increased from the prior study. Pancreas: The pancreas is within normal limits. Spleen: The spleen is unremarkable in appearance. Adrenals/Urinary Tract: The adrenal glands are unremarkable in appearance. The kidneys are within normal limits Scattered bilateral renal cysts are noted, larger on the right. A nonobstructing 7 mm stone is noted at the interpole region of the right kidney. There is no evidence of hydronephrosis. No obstructing ureteral stones are identified. Nonspecific perinephric stranding is noted bilaterally. Stomach/Bowel: The stomach is unremarkable in appearance. The small bowel is within normal limits. The patient is status post appendectomy. The patient's ileocolic anastomosis is grossly unremarkable. Scattered diverticulosis is noted along the proximal sigmoid colon, without evidence of diverticulitis. Vascular/Lymphatic: The abdominal aorta is unremarkable in appearance. The inferior vena cava is grossly unremarkable. No retroperitoneal lymphadenopathy is seen. No pelvic sidewall  lymphadenopathy is identified. Reproductive: The bladder is mildly distended and grossly unremarkable. The uterus is grossly unremarkable. No suspicious adnexal masses are seen. Other: Small bilateral inguinal hernias are noted, containing only fat. Musculoskeletal: No acute osseous abnormalities are identified. The visualized musculature is unremarkable in appearance. IMPRESSION: 1. Small right pleural effusion again noted. Diffuse interstitial prominence may reflect mild interstitial edema. 2. Interval enlargement of innumerable pulmonary metastases, measuring up to 2.0 cm in size. 3. Innumerable masses again noted throughout the liver, reflecting metastatic disease. 4. Small volume ascites within the abdomen and pelvis is increased from the prior study. 5. Enlarged 1.6 cm subcarinal node noted. 6. Cholelithiasis suggested. 7. Scattered bilateral renal cysts. Nonobstructing 7 mm stone at the interpole region of the right kidney. 8. Scattered diverticulosis along the proximal sigmoid colon, without evidence  of diverticulitis. 9. Small bilateral inguinal hernias, containing only fat. Electronically Signed   By: Garald Balding M.D.   On: 09/26/2016 00:14   Ct Abdomen Pelvis W Contrast  Result Date: 09/26/2016 CLINICAL DATA:  Acute onset of generalized abdominal pain, distention and fever. Leukocytosis. Initial encounter. EXAM: CT CHEST, ABDOMEN, AND PELVIS WITH CONTRAST TECHNIQUE: Multidetector CT imaging of the chest, abdomen and pelvis was performed following the standard protocol during bolus administration of intravenous contrast. CONTRAST:  100 mL of Isovue 300 IV contrast COMPARISON:  CT of abdomen and pelvis performed 09/04/2016 FINDINGS: CT CHEST FINDINGS Cardiovascular: The heart is normal in size. The thoracic aorta is unremarkable. No calcific atherosclerotic disease is seen. The great vessels are grossly unremarkable. Mediastinum/Nodes: An enlarged 1.6 cm subcarinal node is again noted. No additional  mediastinal lymphadenopathy is appreciated. No pericardial effusion is identified. The visualized portions of the thyroid gland are unremarkable. No axillary lymphadenopathy is appreciated. A left-sided chest port is noted. Lungs/Pleura: A small right pleural effusion is noted. Diffuse interstitial prominence may reflect mild interstitial edema. There is diffuse enlargement of innumerable pulmonary metastases, measuring up to 2.0 cm in size. No pneumothorax is seen. Musculoskeletal: No acute osseous abnormalities are identified. The visualized musculature is unremarkable in appearance. CT ABDOMEN PELVIS FINDINGS Hepatobiliary: Innumerable masses are noted throughout the liver, reflecting metastatic disease. Stones are suggested at the gallbladder. The gallbladder is decompressed and not well assessed. The common bile duct remains normal in caliber. Small volume ascites is noted within the abdomen and pelvis, increased from the prior study. Pancreas: The pancreas is within normal limits. Spleen: The spleen is unremarkable in appearance. Adrenals/Urinary Tract: The adrenal glands are unremarkable in appearance. The kidneys are within normal limits Scattered bilateral renal cysts are noted, larger on the right. A nonobstructing 7 mm stone is noted at the interpole region of the right kidney. There is no evidence of hydronephrosis. No obstructing ureteral stones are identified. Nonspecific perinephric stranding is noted bilaterally. Stomach/Bowel: The stomach is unremarkable in appearance. The small bowel is within normal limits. The patient is status post appendectomy. The patient's ileocolic anastomosis is grossly unremarkable. Scattered diverticulosis is noted along the proximal sigmoid colon, without evidence of diverticulitis. Vascular/Lymphatic: The abdominal aorta is unremarkable in appearance. The inferior vena cava is grossly unremarkable. No retroperitoneal lymphadenopathy is seen. No pelvic sidewall  lymphadenopathy is identified. Reproductive: The bladder is mildly distended and grossly unremarkable. The uterus is grossly unremarkable. No suspicious adnexal masses are seen. Other: Small bilateral inguinal hernias are noted, containing only fat. Musculoskeletal: No acute osseous abnormalities are identified. The visualized musculature is unremarkable in appearance. IMPRESSION: 1. Small right pleural effusion again noted. Diffuse interstitial prominence may reflect mild interstitial edema. 2. Interval enlargement of innumerable pulmonary metastases, measuring up to 2.0 cm in size. 3. Innumerable masses again noted throughout the liver, reflecting metastatic disease. 4. Small volume ascites within the abdomen and pelvis is increased from the prior study. 5. Enlarged 1.6 cm subcarinal node noted. 6. Cholelithiasis suggested. 7. Scattered bilateral renal cysts. Nonobstructing 7 mm stone at the interpole region of the right kidney. 8. Scattered diverticulosis along the proximal sigmoid colon, without evidence of diverticulitis. 9. Small bilateral inguinal hernias, containing only fat. Electronically Signed   By: Garald Balding M.D.   On: 09/26/2016 00:14    EKG: Independently reviewed.  Sinus rhythm, QTC 464, anteroseptal infarction pattern.   Assessment/Plan Principal Problem:   Sepsis ([place]) Active Problems:   Normocytic anemia  Colon cancer ([place])   Liver metastasis ([place])   Lung metastasis ([place])   Hypothyroidism   Abdominal pain   Nausea, vomiting and diarrhea   Cough   Sepsis [facility]): Patient is septic with leukocytosis, elevated lactate of 4.0, fever, tachycardia and tachypnea. Currently hemodynamically stable. The source of infection is not clear. Urinalysis is negative. Potential differential diagnosis include pneumonia given cough, peritonitis given ascites and gastroenteritis given nausea, vomiting and diarrhea.  - Will admit to tele bed as inpt - IV Vancomycin, Zosyn, azithromycin -  will give one dose of flagyl - will send out C diff pcr if has  Diarrhea again (currently no nausea, vomiting or diarrhea noted, C. difficile PCR not ordered yet). - prn Albuterol Nebs for SOB - Urine legionella and S. pneumococcal antigen - Follow up blood culture x2, sputum culture, plus Flu pcr - will get Procalcitonin and trend lactic acid level per sepsis protocol - IVF: 2L of NS bolus in ED, followed by 75 mL per hour of NS  Nausea, vomiting and diarrhea and abdominal pain: per report, pt has nausea, vomiting, diarrhea and abdominal pain. CT abdomen/pelvis showed metastasize to disease. Also showed small volume ascites within the abdomen and pelvis which has increased from the prior study.  -zofran for nausea and vomiting -IV fluid as above  Altered mental status: Most likely due to sepsis. 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 4. We'll by Dr. Benay Spice -Follow-up with Dr. Benay Spice -hold oral meds due to AMS  Normocytic anemia: Likely due to chemotherapy. Hemoglobin stable. Hemoglobin 10.1 on 10/15/2016-->9.9 today. -Follow-up by CBC   DVT ppx:  SQ Lovenox Code Status: DNR (patient has altered mental status, I could not discuss with patient directly about CODE STATUS, but per ED physician, Dr. Lita Mains, patient is a DO NOT RESUSCITATE, therefore I put CODE STATUS is a DO NOT RESUSCITATE in admission order). Family Communication: None at bed side.    Disposition Plan:  Anticipate discharge back to previous home environment Consults called: none Admission status:  Inpatient/tele   Date of Service 09/26/2016    Ivor Costa [facility] Pager [phone]  If 7PM-7AM, please contact night-coverage [email or web address] Password [email or web address] 09/26/2016, 2:19 AM

## 2016-09-26 NOTE — Progress Notes (Signed)
No ascitic fluid noted on [place].  Patient returned upstairs.  Judith Stephens E 09/26/2016

## 2016-09-26 NOTE — Care Management Note (Signed)
Case Management Note  Patient Details  Name: Thais Silberstein MRN: [id number] Date of Birth: 1941-02-26  Subjective/Objective:   sepsis                 Action/Plan: from home with spouse Date:  September 26, 2016 Chart reviewed for concurrent status and case management needs. Will continue to follow patient progress. Discharge Planning: following for needs Expected discharge date: [id number] Velva Harman, BSN, [place], [place]   Expected Discharge Date:   (UNKNOWN)               Expected Discharge Plan:  Home/Self Care  In-House Referral:     Discharge planning Services     Post Acute Care Choice:    Choice offered to:     DME Arranged:    DME Agency:     HH Arranged:    HH Agency:     Status of Service:  In process, will continue to follow  If discussed at Long Length of Stay Meetings, dates discussed:    Additional Comments:  Leeroy Cha, RN 09/26/2016, 11:33 AM

## 2016-09-26 NOTE — Progress Notes (Signed)
Patient seen and examined this morning, H&P reviewed, admitted by Dr. Blaine Hamper overnight, agree with the A&P  In brief, this is a 76 y.o. female with medical history significant of metastasized colon cancer, hypertension, hyperlipidemia, diet-controlled diabetes, hypothyroidism, anxiety, anemia, who presents with generalized weakness, nausea, vomiting, diarrhea, abdominal pain, cough, shortness breath, appeared to be septic in the ED  Sepsis ([place])  -Continue vancomycin and Zosyn, source not entirely clear.  Obtain peripheral as well as port blood cultures, obtain ultrasound guided paracentesis to rule out SBP given abdominal pain and progressive ascites.  Urinalysis is negative, chest x-ray without pneumonia. -Fluid resuscitated in the ED, she has some crackles on exam and her urine chest x-ray did show some fluid overload.  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 Benay Spice -hold oral meds due to AMS  Normocytic anemia -Likely due to chemotherapy. Hemoglobin stable.  -Follow-up by CBC  Yonathan Perrow M. Cruzita Lederer, MD [facility] [phone]  If 7PM-7AM, please contact night-coverage [email or web address] Password [email or web address]

## 2016-09-26 NOTE — Progress Notes (Signed)
CRITICAL VALUE ALERT  Critical value received:  Lactic Acid 2.4  Date of notification:  09/26/16  Time of notification:  0744  Critical value read back:yes  Nurse who received alert:  Harvest Dark RN  MD notified (1st page):  Dr. Virginia Crews  Time of first page:  [phone] (verbal)

## 2016-09-27 ENCOUNTER — Telehealth: Payer: Self-pay | Admitting: *Deleted

## 2016-09-27 DIAGNOSIS — E46 Unspecified protein-calorie malnutrition: Secondary | ICD-10-CM

## 2016-09-27 DIAGNOSIS — D6481 Anemia due to antineoplastic chemotherapy: Secondary | ICD-10-CM

## 2016-09-27 DIAGNOSIS — C787 Secondary malignant neoplasm of liver and intrahepatic bile duct: Secondary | ICD-10-CM

## 2016-09-27 DIAGNOSIS — E119 Type 2 diabetes mellitus without complications: Secondary | ICD-10-CM

## 2016-09-27 DIAGNOSIS — R918 Other nonspecific abnormal finding of lung field: Secondary | ICD-10-CM

## 2016-09-27 DIAGNOSIS — R509 Fever, unspecified: Secondary | ICD-10-CM

## 2016-09-27 DIAGNOSIS — R1030 Lower abdominal pain, unspecified: Secondary | ICD-10-CM

## 2016-09-27 DIAGNOSIS — C7800 Secondary malignant neoplasm of unspecified lung: Secondary | ICD-10-CM

## 2016-09-27 DIAGNOSIS — C189 Malignant neoplasm of colon, unspecified: Secondary | ICD-10-CM

## 2016-09-27 DIAGNOSIS — R109 Unspecified abdominal pain: Secondary | ICD-10-CM

## 2016-09-27 LAB — BASIC METABOLIC PANEL
Anion gap: 9 (ref 5–15)
BUN: 12 mg/dL (ref 6–20)
CALCIUM: 10.8 mg/dL — AB (ref 8.9–10.3)
CO2: 24 mmol/L (ref 22–32)
CREATININE: 0.63 mg/dL (ref 0.44–1.00)
Chloride: 108 mmol/L (ref 101–111)
GFR calc Af Amer: >60 mL/min (ref >60)
GFR calc non Af Amer: >60 mL/min (ref >60)
Glucose, Bld: 80 mg/dL (ref 65–99)
Potassium: 3.8 mmol/L (ref 3.5–5.1)
Sodium: 141 mmol/L (ref 135–145)

## 2016-09-27 LAB — CBC
HCT: 27.5 % — ABNORMAL LOW (ref 36.0–46.0)
Hemoglobin: 8.8 g/dL — ABNORMAL LOW (ref 12.0–15.0)
MCH: 29.2 pg (ref 26.0–34.0)
MCHC: 32 g/dL (ref 30.0–36.0)
MCV: 91.4 fL (ref 78.0–100.0)
Platelets: 280 10*3/uL (ref 150–400)
RBC: 3.01 MIL/uL — ABNORMAL LOW (ref 3.87–5.11)
RDW: 16.8 % — AB (ref 11.5–15.5)
WBC: 15.2 10*3/uL — ABNORMAL HIGH (ref 4.0–10.5)

## 2016-09-27 MED ORDER — SODIUM CHLORIDE 0.9 % IV SOLN
60.0000 mg | Freq: Once | INTRAVENOUS | Status: AC
  Administered 2016-09-27: 60 mg via INTRAVENOUS
  Filled 2016-09-27: qty 20

## 2016-09-27 MED ORDER — CALCITONIN (SALMON) 200 UNIT/ML IJ SOLN
300.0000 [IU] | Freq: Two times a day (BID) | INTRAMUSCULAR | Status: DC
  Administered 2016-09-27 – 2016-09-28 (×2): 300 [IU] via SUBCUTANEOUS
  Filled 2016-09-27 (×2): qty 1.5

## 2016-09-27 MED ORDER — ENSURE ENLIVE PO LIQD
237.0000 mL | Freq: Two times a day (BID) | ORAL | Status: DC
  Administered 2016-09-27 – 2016-09-29 (×3): 237 mL via ORAL

## 2016-09-27 MED ORDER — LEVALBUTEROL HCL 1.25 MG/0.5ML IN NEBU
1.2500 mg | INHALATION_SOLUTION | Freq: Two times a day (BID) | RESPIRATORY_TRACT | Status: DC
  Administered 2016-09-27 – 2016-09-29 (×5): 1.25 mg via RESPIRATORY_TRACT
  Filled 2016-09-27 (×7): qty 0.5

## 2016-09-27 NOTE — Progress Notes (Signed)
Initial Nutrition Assessment  DOCUMENTATION CODES:   Not applicable  INTERVENTION:   Ensure Enlive po BID, each supplement provides 350 kcal and 20 grams of protein  NUTRITION DIAGNOSIS:   Inadequate oral intake related to other acute illness, (see comment) (altered mental status) as evidenced by per patient/family report.  GOAL:   Patient will meet greater than or equal to 90% of their needs  MONITOR:   PO intake, Supplement acceptance, Labs, Weight trends  REASON FOR ASSESSMENT:   Malnutrition Screening Tool    ASSESSMENT:   76 y.o. female with medical history significant of metastasized colon cancer, hypertension, hyperlipidemia, diet-controlled diabetes, hypothyroidism, anxiety, anemia, who presents with generalized weakness, nausea, vomiting, diarrhea, abdominal pain, cough, shortness breath, appeared to be septic in the ED   Met with pt in room today. Pt reports poor appetite and oral intake for 3 days pta. Pt currently eating 25-75% meals. Pt had SLP evaluation on 4/10 and approved for DYS 3 diet. Per chart, pt is weight stable. Pt reports 25lb weight loss in 1 year; this is not significant. Pt likes Ensure; RD will order.   Medications reviewed and include: lovenox, lactulose, synthroid, zosyn  Labs reviewed: Ca 10.8(H), ammonia 52(H), lactic acid 2.4(H) Wbc- 15.2(H), Hgb 8.8(L), Hct 27.5(L)  Nutrition-Focused physical exam completed. Findings are no fat depletion, mild muscle depletion in clavicles, and no edema.   Diet Order:  DIET DYS 3 Room service appropriate? Yes; Fluid consistency: Thin  Skin:  Reviewed, no issues  Last BM:  none since admit  Height:   Ht Readings from Last 1 Encounters:  09/27/16 _0  (1.803 m)    Weight:   Wt Readings from Last 1 Encounters:  09/26/16 173 lb 8 oz (78.7 kg)    Ideal Body Weight:  70.4 kg  BMI:  Body mass index is 24.2 kg/m.  Estimated Nutritional Needs:   Kcal:  1800-2100kcal/day   Protein:   102-118g/day   Fluid:  >1.8L/day   EDUCATION NEEDS:   No education needs identified at this time  Koleen Distance, RD, LDN Pager #[phone]

## 2016-09-27 NOTE — Telephone Encounter (Signed)
Message from pt's husband requesting to speak with Dr. Benay Spice. Informed him Dr. Benay Spice will be in the room around 0730. Husband requests a call after visit, he doesn't think he can be in the room that early.

## 2016-09-27 NOTE — Progress Notes (Signed)
IP PROGRESS NOTE  Subjective:   Judith Stephens is well-known to me with a history of metastatic colon cancer. She began a first cycle of salvage therapy with Lonsurf. She reports the onset of abdominal pain over the weekend and presented to the emergency room on 10/05/2016. She was noted to be confused and have a fever in the emergency room. She was admitted for further evaluation. She reports feeling better today.  Objective: Vital signs in last 24 hours: Blood pressure (!) 191/99, pulse (!) 113, temperature 98.3 F (36.8 C), temperature source Oral, resp. rate 20, weight 173 lb 8 oz (78.7 kg), SpO2 99 %.  Intake/Output from previous day: 04/10 0701 - 04/11 0700 In: 2009.6 [P.O.:360; I.V.:1399.6; IV Piggyback:250] Out: -   Physical Exam:  HEENT: No thrush or ulcers Lungs: Clear bilaterally, no respiratory distress Cardiac: Regular rate and rhythm Abdomen: The liver is markedly enlarged Extremities: No leg edema Neurologic: Alert, oriented to place and situation  Portacath/PICC-without erythema  Lab Results:  Recent Labs  09/26/16 0653 09/27/16 0423  WBC 13.5* 15.2*  HGB 9.1* 8.8*  HCT 29.4* 27.5*  PLT 264 280    BMET  Recent Labs  09/26/16 0653 09/27/16 0423  NA 142 141  K 3.9 3.8  CL 111 108  CO2 24 24  GLUCOSE 98 80  BUN 10 12  CREATININE 0.57 0.63  CALCIUM 10.8* 10.8*    Studies/Results: Dg Chest 2 View  Result Date: 09/19/2016 CLINICAL DATA:  Acute onset of fever and generalized chest pain. Abdominal distention and generalized weakness. Initial encounter. EXAM: CHEST  2 VIEW COMPARISON:  CT of the chest performed 09/04/2016 FINDINGS: The lungs are hypoexpanded. Vascular crowding and vascular congestion are seen. Bilateral pulmonary nodules are again noted throughout the lungs, reflecting known metastatic disease. Underlying airspace opacification cannot be excluded. A small right pleural effusion is again noted. No pneumothorax is seen. The heart is normal in  size; the mediastinal contour is within normal limits. A left-sided chest port is noted ending about the mid to distal SVC. No acute osseous abnormalities are seen. IMPRESSION: Lungs hypoexpanded. Vascular congestion noted. Bilateral pulmonary nodules again noted throughout the lungs, reflecting known metastatic disease. Underlying pneumonia cannot be excluded, given the patient's symptoms. Small right pleural effusion again noted. Electronically Signed   By: Garald Balding M.D.   On: 09/17/2016 22:08   Ct Chest W Contrast  Result Date: 09/26/2016 CLINICAL DATA:  Acute onset of generalized abdominal pain, distention and fever. Leukocytosis. Initial encounter. EXAM: CT CHEST, ABDOMEN, AND PELVIS WITH CONTRAST TECHNIQUE: Multidetector CT imaging of the chest, abdomen and pelvis was performed following the standard protocol during bolus administration of intravenous contrast. CONTRAST:  100 mL of Isovue 300 IV contrast COMPARISON:  CT of abdomen and pelvis performed 09/04/2016 FINDINGS: CT CHEST FINDINGS Cardiovascular: The heart is normal in size. The thoracic aorta is unremarkable. No calcific atherosclerotic disease is seen. The great vessels are grossly unremarkable. Mediastinum/Nodes: An enlarged 1.6 cm subcarinal node is again noted. No additional mediastinal lymphadenopathy is appreciated. No pericardial effusion is identified. The visualized portions of the thyroid gland are unremarkable. No axillary lymphadenopathy is appreciated. A left-sided chest port is noted. Lungs/Pleura: A small right pleural effusion is noted. Diffuse interstitial prominence may reflect mild interstitial edema. There is diffuse enlargement of innumerable pulmonary metastases, measuring up to 2.0 cm in size. No pneumothorax is seen. Musculoskeletal: No acute osseous abnormalities are identified. The visualized musculature is unremarkable in appearance. CT ABDOMEN PELVIS FINDINGS  Hepatobiliary: Innumerable masses are noted throughout  the liver, reflecting metastatic disease. Stones are suggested at the gallbladder. The gallbladder is decompressed and not well assessed. The common bile duct remains normal in caliber. Small volume ascites is noted within the abdomen and pelvis, increased from the prior study. Pancreas: The pancreas is within normal limits. Spleen: The spleen is unremarkable in appearance. Adrenals/Urinary Tract: The adrenal glands are unremarkable in appearance. The kidneys are within normal limits Scattered bilateral renal cysts are noted, larger on the right. A nonobstructing 7 mm stone is noted at the interpole region of the right kidney. There is no evidence of hydronephrosis. No obstructing ureteral stones are identified. Nonspecific perinephric stranding is noted bilaterally. Stomach/Bowel: The stomach is unremarkable in appearance. The small bowel is within normal limits. The patient is status post appendectomy. The patient's ileocolic anastomosis is grossly unremarkable. Scattered diverticulosis is noted along the proximal sigmoid colon, without evidence of diverticulitis. Vascular/Lymphatic: The abdominal aorta is unremarkable in appearance. The inferior vena cava is grossly unremarkable. No retroperitoneal lymphadenopathy is seen. No pelvic sidewall lymphadenopathy is identified. Reproductive: The bladder is mildly distended and grossly unremarkable. The uterus is grossly unremarkable. No suspicious adnexal masses are seen. Other: Small bilateral inguinal hernias are noted, containing only fat. Musculoskeletal: No acute osseous abnormalities are identified. The visualized musculature is unremarkable in appearance. IMPRESSION: 1. Small right pleural effusion again noted. Diffuse interstitial prominence may reflect mild interstitial edema. 2. Interval enlargement of innumerable pulmonary metastases, measuring up to 2.0 cm in size. 3. Innumerable masses again noted throughout the liver, reflecting metastatic disease. 4.  Small volume ascites within the abdomen and pelvis is increased from the prior study. 5. Enlarged 1.6 cm subcarinal node noted. 6. Cholelithiasis suggested. 7. Scattered bilateral renal cysts. Nonobstructing 7 mm stone at the interpole region of the right kidney. 8. Scattered diverticulosis along the proximal sigmoid colon, without evidence of diverticulitis. 9. Small bilateral inguinal hernias, containing only fat. Electronically Signed   By: Garald Balding M.D.   On: 09/26/2016 00:14   Ct Abdomen Pelvis W Contrast  Result Date: 09/26/2016 CLINICAL DATA:  Acute onset of generalized abdominal pain, distention and fever. Leukocytosis. Initial encounter. EXAM: CT CHEST, ABDOMEN, AND PELVIS WITH CONTRAST TECHNIQUE: Multidetector CT imaging of the chest, abdomen and pelvis was performed following the standard protocol during bolus administration of intravenous contrast. CONTRAST:  100 mL of Isovue 300 IV contrast COMPARISON:  CT of abdomen and pelvis performed 09/04/2016 FINDINGS: CT CHEST FINDINGS Cardiovascular: The heart is normal in size. The thoracic aorta is unremarkable. No calcific atherosclerotic disease is seen. The great vessels are grossly unremarkable. Mediastinum/Nodes: An enlarged 1.6 cm subcarinal node is again noted. No additional mediastinal lymphadenopathy is appreciated. No pericardial effusion is identified. The visualized portions of the thyroid gland are unremarkable. No axillary lymphadenopathy is appreciated. A left-sided chest port is noted. Lungs/Pleura: A small right pleural effusion is noted. Diffuse interstitial prominence may reflect mild interstitial edema. There is diffuse enlargement of innumerable pulmonary metastases, measuring up to 2.0 cm in size. No pneumothorax is seen. Musculoskeletal: No acute osseous abnormalities are identified. The visualized musculature is unremarkable in appearance. CT ABDOMEN PELVIS FINDINGS Hepatobiliary: Innumerable masses are noted throughout the  liver, reflecting metastatic disease. Stones are suggested at the gallbladder. The gallbladder is decompressed and not well assessed. The common bile duct remains normal in caliber. Small volume ascites is noted within the abdomen and pelvis, increased from the prior study. Pancreas: The pancreas is within normal  limits. Spleen: The spleen is unremarkable in appearance. Adrenals/Urinary Tract: The adrenal glands are unremarkable in appearance. The kidneys are within normal limits Scattered bilateral renal cysts are noted, larger on the right. A nonobstructing 7 mm stone is noted at the interpole region of the right kidney. There is no evidence of hydronephrosis. No obstructing ureteral stones are identified. Nonspecific perinephric stranding is noted bilaterally. Stomach/Bowel: The stomach is unremarkable in appearance. The small bowel is within normal limits. The patient is status post appendectomy. The patient's ileocolic anastomosis is grossly unremarkable. Scattered diverticulosis is noted along the proximal sigmoid colon, without evidence of diverticulitis. Vascular/Lymphatic: The abdominal aorta is unremarkable in appearance. The inferior vena cava is grossly unremarkable. No retroperitoneal lymphadenopathy is seen. No pelvic sidewall lymphadenopathy is identified. Reproductive: The bladder is mildly distended and grossly unremarkable. The uterus is grossly unremarkable. No suspicious adnexal masses are seen. Other: Small bilateral inguinal hernias are noted, containing only fat. Musculoskeletal: No acute osseous abnormalities are identified. The visualized musculature is unremarkable in appearance. IMPRESSION: 1. Small right pleural effusion again noted. Diffuse interstitial prominence may reflect mild interstitial edema. 2. Interval enlargement of innumerable pulmonary metastases, measuring up to 2.0 cm in size. 3. Innumerable masses again noted throughout the liver, reflecting metastatic disease. 4. Small  volume ascites within the abdomen and pelvis is increased from the prior study. 5. Enlarged 1.6 cm subcarinal node noted. 6. Cholelithiasis suggested. 7. Scattered bilateral renal cysts. Nonobstructing 7 mm stone at the interpole region of the right kidney. 8. Scattered diverticulosis along the proximal sigmoid colon, without evidence of diverticulitis. 9. Small bilateral inguinal hernias, containing only fat. Electronically Signed   By: Garald Balding M.D.   On: 09/26/2016 00:14   US Abdomen Limited  Result Date: 09/26/2016 CLINICAL DATA:  History of metastatic colon cancer, request to evaluate for ascites. EXAM: LIMITED ABDOMEN ULTRASOUND FOR ASCITES TECHNIQUE: Limited ultrasound survey for ascites was performed in all four abdominal quadrants. COMPARISON:  None. FINDINGS: No ascitic fluid throughout all 4 quadrants. IMPRESSION: No ascites.  No paracentesis performed. Read by: Saverio Danker, PA-C Electronically Signed   By: Corrie Mckusick D.O.   On: 09/26/2016 11:26    Medications: I have reviewed the patient's current medications.  Assessment/Plan:  1. Metastatic colon cancer, cycle 1 of salvage therapy with Lonsurf initiated 09/18/2016  CT chest/abdomen 09/26/2016-progressive enlargement of lung nodules, diffuse metastatic disease involving the liver 2. Abdominal pain likely secondary to extensive liver metastases  3. Fever-possibly tumor fever, possible transient bacteremia related to tumor, no source for infection identified, cultures negative to date  4. Diabetes  5. Anemia secondary to chronic disease, malnutrition, and chemotherapy  6. Hypercalcemia-likely secondary metastatic colon cancer, chronic, has been asymptomatic as an outpatient  Ms. Lesh has advanced metastatic colon cancer. She was admitted with fever, confusion, and nausea/vomiting. The symptoms may all be related to the metastatic tumor burden or an episode of bacteremia. Cultures are negative to date.  The altered  mental status may have been related to the fever and delirium with acute illness. Hypercalcemia may have contributed, though she has not been confused with hypercalcemia in the past. She may have hepatic encephalopathy.  Her overall prognosis is poor. I began a discussion regarding Hospice care with Ms. Hollibaugh. Her husband is not present this morning. I will plan to meet with him or contact him by telephone on 09/28/2016.  She is scheduled for a second opinion appointment at the [facility] [place] on 09/29/2016. I  do not think her performance status will allow the trip  Recommendations: 1. Continue antibiotics, follow-up cultures 2. Intravenous hydration/pamidronate 3. I recommend Hospice care. I will continue discussions with Ms. Corkern and her husband on 09/28/2016   LOS: 1 day   Betsy Coder, MD   09/27/2016, 7:01 AM

## 2016-09-27 NOTE — Progress Notes (Signed)
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 The patient was also confused with abdominal pain for 3 days as well. Upon presentation, the patient was noted to have WBC 14.8 with lactic acid 4.0 and temperature 101.23F. There was concern for sepsis. The patient was fluid resuscitated and started on intravenous vancomycin and Zosyn.  Assessment/Plan: SIRS -Patient presented with elevated lactic acid, fever, sinus tachycardia -Continue empiric vancomycin and Zosyn pending culture data -Urinalysis negative for pyuria -Chest x-ray negative for consolidation -CT chest shows diffuse interstitial prominence with innumerable pulmonary metastasis. -CT abdomen and pelvis revealed cholelithiasis with normal CBD and innumerable hepatic metastasis. There were small volume ascites. There was diverticulosis with small bilateral inguinal hernias. -Lactic acid improved with fluid resuscitation -viral respiratory panel  Acute metabolic encephalopathy -Secondary to hypercalcemia and possible infection -Improving with IV fluids  Hypercalcemia of malignancy -Start calcitonin [place] x 4 doses -pamidronate x 1 dose -am CMP  Vomiting and diarrhea -Resolved -related to [place] and hypercalcemia -Question whether the patient surely had recurrent emesis or diarrhea as she presently denies, and patient was confused at the time of admission -Ultrasound abdomen--no ascites -2 view abd--pt c/o feeling "tight"  Chronic diastolic CHF  -September 0093 echo EF 60-65%, grade 1 DD  Hypertension -Blood pressure acceptable off amlodipine -Monitor clinically  Hypothyroidism -Continue  Synthroid       Disposition Plan:   Home in 2-3 days  Family Communication:   Spouse updated at bedside  Consultants:  [facility]  Code Status:   DNR  DVT Prophylaxis:  [place] Lovenox   Procedures: As Listed in Progress Note Above  Antibiotics: vanco 4/09>>> Zosyn 4/09>>>    Subjective: Patient is feeling better. She denies any vomiting but complains of nausea. Denies any headache, neck pain, chest pain, shortness of breath, abdominal pain. Abdomen feels tight. She denies any dysuria or hematuria.  Objective: Vitals:   09/27/16 0637 09/27/16 0728 09/27/16 0941 09/27/16 1450  BP: (!) 191/99 (!) 142/71    Pulse: (!) 113 (!) 108    Resp: 20     Temp: 98.3 F (36.8 C)     TempSrc: Oral     SpO2: 99%  96%   Weight:      Height:    5\' 11"  (1.803 m)    Intake/Output Summary (Last 24 hours) at 09/27/16 1507 Last data filed at 09/27/16 1325  Gross per 24 hour  Intake          2249.58 ml  Output                0 ml  Net          2249.58 ml   Weight change:  Exam:   General:  Pt is alert, follows commands appropriately, not in acute distress  HEENT: No icterus, No thrush, No neck mass, [place]/AT  Cardiovascular: RRR, S1/S2, no rubs, no gallops  Respiratory: Bibasilar crackles. No wheezing. Good air movement  Abdomen: Soft/+BS, non tender, non distended, no guarding  Extremities: trace LE edema, No lymphangitis, No petechiae, No rashes, no synovitis   Data Reviewed: I have personally reviewed following labs and imaging studies Basic Metabolic Panel:  Recent Labs Lab 10/06/2016  2147 09/26/16 0653 09/27/16 0423  NA 142 142 141  K 4.1 3.9 3.8  CL 110 111 108  CO2 25 24 24   GLUCOSE 130* 98 80  BUN 11 10 12   CREATININE 0.65 0.57 0.63  CALCIUM 11.3* 10.8* 10.8*   Liver Function Tests:  Recent Labs Lab 09/18/2016 2147  AST 113*  ALT 20  ALKPHOS 194*  BILITOT 1.1  PROT 7.5  ALBUMIN 2.8*   No results for input(s): LIPASE, AMYLASE in the last 168  hours.  Recent Labs Lab 09/26/16 1000  AMMONIA 52*   Coagulation Profile:  Recent Labs Lab 09/24/2016 2147  INR 1.13   CBC:  Recent Labs Lab 09/22/2016 2147 09/26/16 0653 09/27/16 0423  WBC 14.8* 13.5* 15.2*  NEUTROABS 10.7*  --   --   HGB 9.9* 9.1* 8.8*  HCT 31.0* 29.4* 27.5*  MCV 91.2 89.6 91.4  PLT 308 264 280   Cardiac Enzymes: No results for input(s): CKTOTAL, CKMB, CKMBINDEX, TROPONINI in the last 168 hours. BNP: Invalid input(s): POCBNP CBG: No results for input(s): GLUCAP in the last 168 hours. HbA1C: No results for input(s): HGBA1C in the last 72 hours. Urine analysis:    Component Value Date/Time   COLORURINE AMBER (A) 09/28/2016 2145   APPEARANCEUR HAZY (A) 10/09/2016 2145   LABSPEC 1.019 10/02/2016 2145   PHURINE 6.0 09/21/2016 2145   GLUCOSEU NEGATIVE 10/04/2016 2145   HGBUR MODERATE (A) 10/05/2016 2145   BILIRUBINUR NEGATIVE 10/15/2016 2145   [place] NEGATIVE 10/16/2016 2145   PROTEINUR 30 (A) 10/02/2016 2145   NITRITE NEGATIVE 09/21/2016 2145   LEUKOCYTESUR NEGATIVE 09/23/2016 2145   Sepsis Labs: @LABRCNTIP (procalcitonin:4,lacticidven:4) ) Recent Results (from the past 240 hour(s))  Culture, blood (Routine x 2)     Status: None (Preliminary result)   Collection Time: 09/28/2016  9:47 PM  Result Value Ref Range Status   Specimen Description BLOOD LEFT FOREARM  Final   Special Requests   Final    BOTTLES DRAWN AEROBIC AND ANAEROBIC Blood Culture adequate volume   Culture   Final    NO GROWTH 2 DAYS Performed at [facility], [place] [address] [place], [place] [address]    Report Status PENDING  Incomplete  Culture, blood (Routine x 2)     Status: None (Preliminary result)   Collection Time: 09/19/2016  9:47 PM  Result Value Ref Range Status   Specimen Description BLOOD RIGHT ANTECUBITAL  Final   Special Requests   Final    BOTTLES DRAWN AEROBIC AND ANAEROBIC Blood Culture adequate volume   Culture   Final    NO GROWTH 2  DAYS Performed at [facility], [place] [address] [place], [place] [address]    Report Status PENDING  Incomplete  Culture, blood (single)     Status: None (Preliminary result)   Collection Time: 09/26/16  8:30 AM  Result Value Ref Range Status   Specimen Description BLOOD PICC LINE  Final   Special Requests BOTTLES DRAWN AEROBIC AND ANAEROBIC BCLV  Final   Culture   Final    NO GROWTH < 24 HOURS Performed at [facility], [place] [address], [place], [place] [address]    Report Status PENDING  Incomplete     Scheduled Meds: . calcitonin  300 Units Subcutaneous Q12H  . enoxaparin (LOVENOX) injection  40 mg Subcutaneous Q24H  . feeding supplement (ENSURE ENLIVE)  237 mL Oral BID BM  . lactulose  30 g Oral BID  . levalbuterol  1.25 mg Nebulization BID  . levothyroxine  75 mcg Intravenous Daily  . pamidronate  60 mg Intravenous Once  . piperacillin-tazobactam (ZOSYN)  IV  3.375 g Intravenous Q8H  . sodium chloride  1,000 mL Intravenous Once  . vancomycin  1,000 mg Intravenous Q12H   Continuous Infusions: . sodium chloride 50 mL/hr at 09/26/16 9924    Procedures/Studies: Dg Chest 2 View  Result Date: 09/30/2016 CLINICAL DATA:  Acute onset of fever and generalized chest pain. Abdominal distention and generalized weakness. Initial encounter. EXAM: CHEST  2 VIEW COMPARISON:  CT of the chest performed 09/04/2016 FINDINGS: The lungs are hypoexpanded. Vascular crowding and vascular congestion are seen. Bilateral pulmonary nodules are again noted throughout the lungs, reflecting known metastatic disease. Underlying airspace opacification cannot be excluded. A small right pleural effusion is again noted. No pneumothorax is seen. The heart is normal in size; the mediastinal contour is within normal limits. A left-sided chest port is noted ending about the mid to distal SVC. No acute osseous abnormalities are seen. IMPRESSION: Lungs hypoexpanded. Vascular congestion noted. Bilateral  pulmonary nodules again noted throughout the lungs, reflecting known metastatic disease. Underlying pneumonia cannot be excluded, given the patient's symptoms. Small right pleural effusion again noted. Electronically Signed   By: Garald Balding M.D.   On: 09/24/2016 22:08   Ct Chest W Contrast  Result Date: 09/26/2016 CLINICAL DATA:  Acute onset of generalized abdominal pain, distention and fever. Leukocytosis. Initial encounter. EXAM: CT CHEST, ABDOMEN, AND PELVIS WITH CONTRAST TECHNIQUE: Multidetector CT imaging of the chest, abdomen and pelvis was performed following the standard protocol during bolus administration of intravenous contrast. CONTRAST:  100 mL of Isovue 300 IV contrast COMPARISON:  CT of abdomen and pelvis performed 09/04/2016 FINDINGS: CT CHEST FINDINGS Cardiovascular: The heart is normal in size. The thoracic aorta is unremarkable. No calcific atherosclerotic disease is seen. The great vessels are grossly unremarkable. Mediastinum/Nodes: An enlarged 1.6 cm subcarinal node is again noted. No additional mediastinal lymphadenopathy is appreciated. No pericardial effusion is identified. The visualized portions of the thyroid gland are unremarkable. No axillary lymphadenopathy is appreciated. A left-sided chest port is noted. Lungs/Pleura: A small right pleural effusion is noted. Diffuse interstitial prominence may reflect mild interstitial edema. There is diffuse enlargement of innumerable pulmonary metastases, measuring up to 2.0 cm in size. No pneumothorax is seen. Musculoskeletal: No acute osseous abnormalities are identified. The visualized musculature is unremarkable in appearance. CT ABDOMEN PELVIS FINDINGS Hepatobiliary: Innumerable masses are noted throughout the liver, reflecting metastatic disease. Stones are suggested at the gallbladder. The gallbladder is decompressed and not well assessed. The common bile duct remains normal in caliber. Small volume ascites is noted within the  abdomen and pelvis, increased from the prior study. Pancreas: The pancreas is within normal limits. Spleen: The spleen is unremarkable in appearance. Adrenals/Urinary Tract: The adrenal glands are unremarkable in appearance. The kidneys are within normal limits Scattered bilateral renal cysts are noted, larger on the right. A nonobstructing 7 mm stone is noted at the interpole region of the right kidney. There is no evidence of hydronephrosis. No obstructing ureteral stones are identified. Nonspecific perinephric stranding is noted bilaterally. Stomach/Bowel: The stomach is unremarkable in appearance. The small bowel is within normal limits. The patient is status post appendectomy. The patient's ileocolic anastomosis is grossly unremarkable. Scattered diverticulosis is noted along the proximal sigmoid colon, without evidence of diverticulitis. Vascular/Lymphatic: The abdominal aorta is unremarkable in appearance. The inferior vena cava is grossly unremarkable. No retroperitoneal lymphadenopathy  is seen. No pelvic sidewall lymphadenopathy is identified. Reproductive: The bladder is mildly distended and grossly unremarkable. The uterus is grossly unremarkable. No suspicious adnexal masses are seen. Other: Small bilateral inguinal hernias are noted, containing only fat. Musculoskeletal: No acute osseous abnormalities are identified. The visualized musculature is unremarkable in appearance. IMPRESSION: 1. Small right pleural effusion again noted. Diffuse interstitial prominence may reflect mild interstitial edema. 2. Interval enlargement of innumerable pulmonary metastases, measuring up to 2.0 cm in size. 3. Innumerable masses again noted throughout the liver, reflecting metastatic disease. 4. Small volume ascites within the abdomen and pelvis is increased from the prior study. 5. Enlarged 1.6 cm subcarinal node noted. 6. Cholelithiasis suggested. 7. Scattered bilateral renal cysts. Nonobstructing 7 mm stone at the  interpole region of the right kidney. 8. Scattered diverticulosis along the proximal sigmoid colon, without evidence of diverticulitis. 9. Small bilateral inguinal hernias, containing only fat. Electronically Signed   By: Garald Balding M.D.   On: 09/26/2016 00:14   Ct Chest W Contrast  Result Date: 09/04/2016 CLINICAL DATA:  Colon cancer diagnosed 5/6 years ago. Ascending colonic primary. Restaging. Diabetes. Hypertension. Anemia. EXAM: CT CHEST, ABDOMEN, AND PELVIS WITH CONTRAST TECHNIQUE: Multidetector CT imaging of the chest, abdomen and pelvis was performed following the standard protocol during bolus administration of intravenous contrast. CONTRAST:  157mL ISOVUE-300 IOPAMIDOL (ISOVUE-300) INJECTION 61% COMPARISON:  07/07/2016 FINDINGS: CT CHEST FINDINGS Cardiovascular: Tortuous thoracic aorta. Normal heart size, without pericardial effusion. No central pulmonary embolism, on this non-dedicated study. Mediastinum/Nodes: No supraclavicular adenopathy. Right paratracheal node is similar at 9 mm on image 18/series 2. No hilar adenopathy. Low periesophageal node is similar at 12 mm on image 37/series 2. Lungs/Pleura: Small right pleural effusion is minimally increased. Trace left pleural fluid is new. Mild motion degradation throughout. Extensive bilateral pulmonary metastasis. Index left upper lobe nodule measures 1.6 cm on image 56/series 6. Compare 1.4 cm on the prior. Index right lower lobe nodule measures 1.4 cm on image 56/series 6. Compare 1.2 cm on the prior. More cephalad left upper lobe index nodule measures 1.3 cm on image 41/series 6 versus 1.1 cm on the prior. No lobar consolidation. Musculoskeletal: No acute osseous abnormality. CT ABDOMEN PELVIS FINDINGS Hepatobiliary: Hepatomegaly at 22.0 cm craniocaudal. Extensive hepatic metastasis. Index high right hepatic lobe lesion measures 2.9 x 2.4 cm on image 40/series 2. Compare 2.7 x 2.1 cm on the prior exam (when remeasured). Calcified lesion just  cephalad to the gallbladder is similar at 3.3 cm on image 58/series 2. Anterior left hepatic lobe 1.5 cm lesion image 54/series 2 is felt to be minimally enlarged from 1.4 cm previously. More central left lateral liver or lesion measures 1.7 cm on image 55/series 2 and is similar. Cholelithiasis without cholecystitis or biliary duct dilatation. Pancreas: Normal, without mass or ductal dilatation. Spleen: Subcapsular subcentimeter low-density splenic lesion is unchanged and of doubtful clinical significance. Adrenals/Urinary Tract: Normal adrenal glands. Right renal cysts. An interpolar right renal lesion measures 1.7 cm on image 78/series 2 and demonstrates complexity. This is unchanged in size to on the prior and similar in size back to 03/05/2012. No hydronephrosis. Normal urinary bladder. Stomach/Bowel: Mild motion degradation continuing into the upper abdomen. Portions of the stomach are collapsed. Colonic stool burden suggests constipation. Partial right hemicolectomy. Normal small bowel. Vascular/Lymphatic: Aortic and branch vessel atherosclerosis. Upper abdominal adenopathy, including a gastrohepatic ligament node of 1.6 cm on image 52/series 2. This is felt to be similar to the prior exam (when remeasured). No  pelvic sidewall adenopathy. Reproductive: Normal uterus and adnexa. Other: Small volume abdominopelvic fluid is unchanged. No specific findings for omental or peritoneal metastasis. Small bilateral fat containing inguinal hernias. Musculoskeletal: No acute osseous abnormality. 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 IMPRESSION: 1. Mild progression of pulmonary metastasis. 2. Similar to minimal progression of hepatic metastasis. 3. Similar lower thoracic and upper abdominal nodal metastasis. 4. Mild motion degradation. 5. Similar small volume abdominopelvic fluid. 6. Increase in small right pleural effusion with new trace left pleural fluid. 7. Cholelithiasis. 8.  Possible  constipation. Electronically Signed   By: Abigail Miyamoto M.D.   On: 09/04/2016 19:39   Ct Abdomen Pelvis W Contrast  Result Date: 09/26/2016 CLINICAL DATA:  Acute onset of generalized abdominal pain, distention and fever. Leukocytosis. Initial encounter. EXAM: CT CHEST, ABDOMEN, AND PELVIS WITH CONTRAST TECHNIQUE: Multidetector CT imaging of the chest, abdomen and pelvis was performed following the standard protocol during bolus administration of intravenous contrast. CONTRAST:  100 mL of Isovue 300 IV contrast COMPARISON:  CT of abdomen and pelvis performed 09/04/2016 FINDINGS: CT CHEST FINDINGS Cardiovascular: The heart is normal in size. The thoracic aorta is unremarkable. No calcific atherosclerotic disease is seen. The great vessels are grossly unremarkable. Mediastinum/Nodes: An enlarged 1.6 cm subcarinal node is again noted. No additional mediastinal lymphadenopathy is appreciated. No pericardial effusion is identified. The visualized portions of the thyroid gland are unremarkable. No axillary lymphadenopathy is appreciated. A left-sided chest port is noted. Lungs/Pleura: A small right pleural effusion is noted. Diffuse interstitial prominence may reflect mild interstitial edema. There is diffuse enlargement of innumerable pulmonary metastases, measuring up to 2.0 cm in size. No pneumothorax is seen. Musculoskeletal: No acute osseous abnormalities are identified. The visualized musculature is unremarkable in appearance. CT ABDOMEN PELVIS FINDINGS Hepatobiliary: Innumerable masses are noted throughout the liver, reflecting metastatic disease. Stones are suggested at the gallbladder. The gallbladder is decompressed and not well assessed. The common bile duct remains normal in caliber. Small volume ascites is noted within the abdomen and pelvis, increased from the prior study. Pancreas: The pancreas is within normal limits. Spleen: The spleen is unremarkable in appearance. Adrenals/Urinary Tract: The adrenal  glands are unremarkable in appearance. The kidneys are within normal limits Scattered bilateral renal cysts are noted, larger on the right. A nonobstructing 7 mm stone is noted at the interpole region of the right kidney. There is no evidence of hydronephrosis. No obstructing ureteral stones are identified. Nonspecific perinephric stranding is noted bilaterally. Stomach/Bowel: The stomach is unremarkable in appearance. The small bowel is within normal limits. The patient is status post appendectomy. The patient's ileocolic anastomosis is grossly unremarkable. Scattered diverticulosis is noted along the proximal sigmoid colon, without evidence of diverticulitis. Vascular/Lymphatic: The abdominal aorta is unremarkable in appearance. The inferior vena cava is grossly unremarkable. No retroperitoneal lymphadenopathy is seen. No pelvic sidewall lymphadenopathy is identified. Reproductive: The bladder is mildly distended and grossly unremarkable. The uterus is grossly unremarkable. No suspicious adnexal masses are seen. Other: Small bilateral inguinal hernias are noted, containing only fat. Musculoskeletal: No acute osseous abnormalities are identified. The visualized musculature is unremarkable in appearance. IMPRESSION: 1. Small right pleural effusion again noted. Diffuse interstitial prominence may reflect mild interstitial edema. 2. Interval enlargement of innumerable pulmonary metastases, measuring up to 2.0 cm in size. 3. Innumerable masses again noted throughout the liver, reflecting metastatic disease. 4. Small volume ascites within the abdomen and pelvis is increased from the prior study. 5. Enlarged 1.6 cm subcarinal node noted.  6. Cholelithiasis suggested. 7. Scattered bilateral renal cysts. Nonobstructing 7 mm stone at the interpole region of the right kidney. 8. Scattered diverticulosis along the proximal sigmoid colon, without evidence of diverticulitis. 9. Small bilateral inguinal hernias, containing only  fat. Electronically Signed   By: Garald Balding M.D.   On: 09/26/2016 00:14   Ct Abdomen Pelvis W Contrast  Result Date: 09/04/2016 CLINICAL DATA:  Colon cancer diagnosed 5/6 years ago. Ascending colonic primary. Restaging. Diabetes. Hypertension. Anemia. EXAM: CT CHEST, ABDOMEN, AND PELVIS WITH CONTRAST TECHNIQUE: Multidetector CT imaging of the chest, abdomen and pelvis was performed following the standard protocol during bolus administration of intravenous contrast. CONTRAST:  127mL ISOVUE-300 IOPAMIDOL (ISOVUE-300) INJECTION 61% COMPARISON:  07/07/2016 FINDINGS: CT CHEST FINDINGS Cardiovascular: Tortuous thoracic aorta. Normal heart size, without pericardial effusion. No central pulmonary embolism, on this non-dedicated study. Mediastinum/Nodes: No supraclavicular adenopathy. Right paratracheal node is similar at 9 mm on image 18/series 2. No hilar adenopathy. Low periesophageal node is similar at 12 mm on image 37/series 2. Lungs/Pleura: Small right pleural effusion is minimally increased. Trace left pleural fluid is new. Mild motion degradation throughout. Extensive bilateral pulmonary metastasis. Index left upper lobe nodule measures 1.6 cm on image 56/series 6. Compare 1.4 cm on the prior. Index right lower lobe nodule measures 1.4 cm on image 56/series 6. Compare 1.2 cm on the prior. More cephalad left upper lobe index nodule measures 1.3 cm on image 41/series 6 versus 1.1 cm on the prior. No lobar consolidation. Musculoskeletal: No acute osseous abnormality. CT ABDOMEN PELVIS FINDINGS Hepatobiliary: Hepatomegaly at 22.0 cm craniocaudal. Extensive hepatic metastasis. Index high right hepatic lobe lesion measures 2.9 x 2.4 cm on image 40/series 2. Compare 2.7 x 2.1 cm on the prior exam (when remeasured). Calcified lesion just cephalad to the gallbladder is similar at 3.3 cm on image 58/series 2. Anterior left hepatic lobe 1.5 cm lesion image 54/series 2 is felt to be minimally enlarged from 1.4 cm  previously. More central left lateral liver or lesion measures 1.7 cm on image 55/series 2 and is similar. Cholelithiasis without cholecystitis or biliary duct dilatation. Pancreas: Normal, without mass or ductal dilatation. Spleen: Subcapsular subcentimeter low-density splenic lesion is unchanged and of doubtful clinical significance. Adrenals/Urinary Tract: Normal adrenal glands. Right renal cysts. An interpolar right renal lesion measures 1.7 cm on image 78/series 2 and demonstrates complexity. This is unchanged in size to on the prior and similar in size back to 03/05/2012. No hydronephrosis. Normal urinary bladder. Stomach/Bowel: Mild motion degradation continuing into the upper abdomen. Portions of the stomach are collapsed. Colonic stool burden suggests constipation. Partial right hemicolectomy. Normal small bowel. Vascular/Lymphatic: Aortic and branch vessel atherosclerosis. Upper abdominal adenopathy, including a gastrohepatic ligament node of 1.6 cm on image 52/series 2. This is felt to be similar to the prior exam (when remeasured). No pelvic sidewall adenopathy. Reproductive: Normal uterus and adnexa. Other: Small volume abdominopelvic fluid is unchanged. No specific findings for omental or peritoneal metastasis. Small bilateral fat containing inguinal hernias. Musculoskeletal: No acute osseous abnormality. 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 IMPRESSION: 1. Mild progression of pulmonary metastasis. 2. Similar to minimal progression of hepatic metastasis. 3. Similar lower thoracic and upper abdominal nodal metastasis. 4. Mild motion degradation. 5. Similar small volume abdominopelvic fluid. 6. Increase in small right pleural effusion with new trace left pleural fluid. 7. Cholelithiasis. 8.  Possible constipation. Electronically Signed   By: Abigail Miyamoto M.D.   On: 09/04/2016 19:39   US Abdomen Limited  Result Date: 09/26/2016 CLINICAL DATA:  History of metastatic colon  cancer, request to evaluate for ascites. EXAM: LIMITED ABDOMEN ULTRASOUND FOR ASCITES TECHNIQUE: Limited ultrasound survey for ascites was performed in all four abdominal quadrants. COMPARISON:  None. FINDINGS: No ascitic fluid throughout all 4 quadrants. IMPRESSION: No ascites.  No paracentesis performed. Read by: Saverio Danker, PA-C Electronically Signed   By: Corrie Mckusick D.O.   On: 09/26/2016 11:26    Austine Wiedeman, DO  [facility] Pager [phone]  If 7PM-7AM, please contact night-coverage [email or web address] Password [email or web address] 09/27/2016, 3:07 PM   LOS: 1 day

## 2016-09-28 ENCOUNTER — Other Ambulatory Visit: Payer: Medicare Other

## 2016-09-28 ENCOUNTER — Inpatient Hospital Stay (HOSPITAL_COMMUNITY): Payer: Medicare Other

## 2016-09-28 ENCOUNTER — Ambulatory Visit: Payer: Medicare Other | Admitting: Oncology

## 2016-09-28 DIAGNOSIS — I509 Heart failure, unspecified: Secondary | ICD-10-CM

## 2016-09-28 DIAGNOSIS — E7220 Disorder of urea cycle metabolism, unspecified: Secondary | ICD-10-CM

## 2016-09-28 DIAGNOSIS — R197 Diarrhea, unspecified: Secondary | ICD-10-CM

## 2016-09-28 DIAGNOSIS — R112 Nausea with vomiting, unspecified: Secondary | ICD-10-CM

## 2016-09-28 DIAGNOSIS — I10 Essential (primary) hypertension: Secondary | ICD-10-CM

## 2016-09-28 LAB — CBC WITH DIFFERENTIAL/PLATELET
BASOS PCT: 0 %
Basophils Absolute: 0 10*3/uL (ref 0.0–0.1)
Eosinophils Absolute: 0 10*3/uL (ref 0.0–0.7)
Eosinophils Relative: 0 %
HEMATOCRIT: 29 % — AB (ref 36.0–46.0)
Hemoglobin: 9.2 g/dL — ABNORMAL LOW (ref 12.0–15.0)
Lymphocytes Relative: 9 %
Lymphs Abs: 1.7 10*3/uL (ref 0.7–4.0)
MCH: 28.8 pg (ref 26.0–34.0)
MCHC: 31.7 g/dL (ref 30.0–36.0)
MCV: 90.6 fL (ref 78.0–100.0)
MONO ABS: 1.9 10*3/uL — AB (ref 0.1–1.0)
MONOS PCT: 10 %
NEUTROS ABS: 14.7 10*3/uL — AB (ref 1.7–7.7)
Neutrophils Relative %: 81 %
Platelets: 302 10*3/uL (ref 150–400)
RBC: 3.2 MIL/uL — ABNORMAL LOW (ref 3.87–5.11)
RDW: 16.8 % — AB (ref 11.5–15.5)
WBC: 18.3 10*3/uL — ABNORMAL HIGH (ref 4.0–10.5)

## 2016-09-28 LAB — RESPIRATORY PANEL BY PCR
ADENOVIRUS-RVPPCR: NOT DETECTED
Bordetella pertussis: NOT DETECTED
CHLAMYDOPHILA PNEUMONIAE-RVPPCR: NOT DETECTED
CORONAVIRUS 229E-RVPPCR: NOT DETECTED
CORONAVIRUS OC43-RVPPCR: NOT DETECTED
Coronavirus HKU1: NOT DETECTED
Coronavirus NL63: NOT DETECTED
INFLUENZA A-RVPPCR: NOT DETECTED
INFLUENZA B-RVPPCR: NOT DETECTED
Metapneumovirus: NOT DETECTED
Mycoplasma pneumoniae: NOT DETECTED
PARAINFLUENZA VIRUS 1-RVPPCR: NOT DETECTED
PARAINFLUENZA VIRUS 4-RVPPCR: NOT DETECTED
Parainfluenza Virus 2: NOT DETECTED
Parainfluenza Virus 3: NOT DETECTED
RESPIRATORY SYNCYTIAL VIRUS-RVPPCR: NOT DETECTED
Rhinovirus / Enterovirus: NOT DETECTED

## 2016-09-28 LAB — TSH: TSH: 1.094 u[IU]/mL (ref 0.350–4.500)

## 2016-09-28 LAB — LACTIC ACID, PLASMA: LACTIC ACID, VENOUS: 2.6 mmol/L — AB (ref 0.5–1.9)

## 2016-09-28 LAB — MRSA PCR SCREENING: MRSA BY PCR: NEGATIVE

## 2016-09-28 LAB — COMPREHENSIVE METABOLIC PANEL
ALBUMIN: 2.4 g/dL — AB (ref 3.5–5.0)
ALT: 26 U/L (ref 14–54)
ANION GAP: 9 (ref 5–15)
AST: 168 U/L — AB (ref 15–41)
Alkaline Phosphatase: 160 U/L — ABNORMAL HIGH (ref 38–126)
BILIRUBIN TOTAL: 1.2 mg/dL (ref 0.3–1.2)
BUN: 12 mg/dL (ref 6–20)
CALCIUM: 10.4 mg/dL — AB (ref 8.9–10.3)
CO2: 23 mmol/L (ref 22–32)
Chloride: 108 mmol/L (ref 101–111)
Creatinine, Ser: 0.59 mg/dL (ref 0.44–1.00)
GLUCOSE: 180 mg/dL — AB (ref 65–99)
Potassium: 3.7 mmol/L (ref 3.5–5.1)
SODIUM: 140 mmol/L (ref 135–145)
TOTAL PROTEIN: 6.8 g/dL (ref 6.5–8.1)

## 2016-09-28 LAB — AMMONIA: Ammonia: 39 umol/L — ABNORMAL HIGH (ref 9–35)

## 2016-09-28 LAB — PROCALCITONIN: PROCALCITONIN: 1.43 ng/mL

## 2016-09-28 LAB — ECHOCARDIOGRAM COMPLETE
HEIGHTINCHES: 71 in
Weight: 2776.03 oz

## 2016-09-28 LAB — GLUCOSE, CAPILLARY: Glucose-Capillary: 152 mg/dL — ABNORMAL HIGH (ref 65–99)

## 2016-09-28 LAB — CK: Total CK: 408 U/L — ABNORMAL HIGH (ref 38–234)

## 2016-09-28 MED ORDER — AMLODIPINE BESYLATE 10 MG PO TABS
10.0000 mg | ORAL_TABLET | Freq: Every day | ORAL | Status: DC
  Administered 2016-09-28: 10 mg via ORAL
  Filled 2016-09-28: qty 1

## 2016-09-28 MED ORDER — IOPAMIDOL (ISOVUE-370) INJECTION 76%
100.0000 mL | Freq: Once | INTRAVENOUS | Status: AC | PRN
  Administered 2016-09-28: 100 mL via INTRAVENOUS

## 2016-09-28 MED ORDER — SODIUM CHLORIDE 0.9 % IV BOLUS (SEPSIS)
500.0000 mL | Freq: Once | INTRAVENOUS | Status: AC
  Administered 2016-09-28: 500 mL via INTRAVENOUS

## 2016-09-28 MED ORDER — DILTIAZEM LOAD VIA INFUSION
10.0000 mg | Freq: Once | INTRAVENOUS | Status: AC
Start: 2016-09-28 — End: 2016-09-28
  Administered 2016-09-28: 10 mg via INTRAVENOUS
  Filled 2016-09-28: qty 10

## 2016-09-28 MED ORDER — DEXTROSE 5 % IV SOLN
5.0000 mg/h | INTRAVENOUS | Status: DC
  Administered 2016-09-28: 5 mg/h via INTRAVENOUS
  Administered 2016-09-29 (×3): 15 mg/h via INTRAVENOUS
  Administered 2016-09-29: 10 mg/h via INTRAVENOUS
  Administered 2016-09-30: 15 mg/h via INTRAVENOUS
  Filled 2016-09-28 (×7): qty 100

## 2016-09-28 MED ORDER — IOPAMIDOL (ISOVUE-370) INJECTION 76%
INTRAVENOUS | Status: AC
  Filled 2016-09-28: qty 100

## 2016-09-28 MED ORDER — POTASSIUM CHLORIDE IN NACL 20-0.9 MEQ/L-% IV SOLN
INTRAVENOUS | Status: DC
  Administered 2016-09-28 – 2016-09-29 (×3): via INTRAVENOUS
  Filled 2016-09-28 (×4): qty 1000

## 2016-09-28 MED ORDER — HALOPERIDOL LACTATE 5 MG/ML IJ SOLN
2.0000 mg | Freq: Four times a day (QID) | INTRAMUSCULAR | Status: DC | PRN
Start: 2016-09-28 — End: 2016-09-30

## 2016-09-28 MED ORDER — SODIUM CHLORIDE 0.9 % IV BOLUS (SEPSIS)
1000.0000 mL | Freq: Once | INTRAVENOUS | Status: AC
  Administered 2016-09-28: 1000 mL via INTRAVENOUS

## 2016-09-28 MED ORDER — POTASSIUM CHLORIDE 2 MEQ/ML IV SOLN: INTRAVENOUS | Status: DC

## 2016-09-28 NOTE — Progress Notes (Signed)
  Echocardiogram 2D Echocardiogram has been performed.  Judith Stephens 09/28/2016, 1:02 PM

## 2016-09-28 NOTE — Progress Notes (Signed)
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 The patient was also confused with abdominal pain for 3 days as well. Upon presentation, the patient was noted to have WBC 14.8 with lactic acid 4.0 and temperature 101.2F. There was concern for sepsis. The patient was fluid resuscitated and started on intravenous vancomycin and Zosyn.  Assessment/Plan: SIRS -Patient presented with elevated lactic acid, fever, sinus tachycardia -Continue empiric vancomycin and Zosyn pending culture data -Urinalysis negative for pyuria -Chest x-ray negative for consolidation -CT chest shows diffuse interstitial prominence with innumerable pulmonary metastasis. -CT abdomen and pelvis revealed cholelithiasis with normal CBD and innumerable hepatic metastasis. There were small volume ascites. There was diverticulosis with small bilateral inguinal hernias. -Lactic acid improved with fluid resuscitation -viral respiratory panel -blood cultures remain neg -d/c abx in 24 hours if cultures remain neg and no clear source  Acute metabolic encephalopathy -Secondary to hypercalcemia and possible infection -Improving with IV fluids  Hypercalcemia of malignancy -continue calcitonin [place] x 4 doses total -pamidronate x 1 dose -am CMP -corrected calcium 11.68  Hyperammonemia -unclear significance as pt's mental status improving before lactulose -recheck ammonia  Sinus tachycardia -likely due to volume depletion -NS bolus, increase IVF rate with KCl -Echo -TSH -CT angio chest  Vomiting and diarrhea -Resolved -related to [place] and hypercalcemia -Ultrasound abdomen--no ascites -2 view  abd--pt c/o feeling "tight"  Transaminasemia -due to hepatic mets -CT abd without signs of cholecystitis  Chronic diastolic CHF  -September 7494 echo EF 60-65%, grade 1 DD  Diabetes mellitus type 2 -Check hemoglobin A1c -Previously on glipizide as an outpatient -ISS  Hypertension -BP elevated -restart amlodipine  Hypothyroidism -Continue Synthroid    Disposition Plan:   Home in 2-3 days  Family Communication:   Spouse updated at bedside--Total time spent 35 minutes.  Greater than 50% spent face to face counseling and coordinating care.   Consultants:  [facility]  Code Status:   DNR  DVT Prophylaxis:  [place] Lovenox   Procedures: As Listed in Progress Note Above  Antibiotics: vanco 4/09>>> Zosyn 4/09>>>     Subjective: Patient complaining of shivering. Denies any headache, neck pain, chest pain.  Complains of intermittent shortness of breath. Denies any nausea, vomiting. Having loose stools. Denies any abdominal pain but complains of abdominal tightness. No dysuria or hematuria.  Objective: Vitals:   09/27/16 2042 09/28/16 0535 09/28/16 0647 09/28/16 0739  BP:  (!) 166/104 (!) 165/93 (!) 174/90  Pulse:  (!) 125  (!) 122  Resp:  (!) 24  (!) 21  Temp:  97.6 F (36.4 C)  98.5 F (36.9 C)  TempSrc:  Oral  Oral  SpO2: 95% 100%  100%  Weight:      Height:        Intake/Output Summary (Last 24 hours) at 09/28/16 0828 Last data filed at 09/28/16 0535  Gross per 24 hour  Intake          2786.67 ml  Output                0 ml  Net          2786.67 ml   Weight change:  Exam:   General:  Pt is alert,  follows commands appropriately, not in acute distress  HEENT: No icterus, No thrush, No neck mass, [place]/AT  Cardiovascular: RRR, S1/S2, no rubs, no gallops  Respiratory: CTA bilaterally, no wheezing, no crackles, no rhonchi  Abdomen: Soft/+BS, non tender, non distended, no guarding  Extremities: No edema, No lymphangitis, No petechiae,  No rashes, no synovitis   Data Reviewed: I have personally reviewed following labs and imaging studies Basic Metabolic Panel:  Recent Labs Lab 09/28/2016 2147 09/26/16 0653 09/27/16 0423 09/28/16 0446  NA 142 142 141 140  K 4.1 3.9 3.8 3.7  CL 110 111 108 108  CO2 25 24 24 23   GLUCOSE 130* 98 80 180*  BUN 11 10 12 12   CREATININE 0.65 0.57 0.63 0.59  CALCIUM 11.3* 10.8* 10.8* 10.4*   Liver Function Tests:  Recent Labs Lab 09/24/2016 2147 09/28/16 0446  AST 113* 168*  ALT 20 26  ALKPHOS 194* 160*  BILITOT 1.1 1.2  PROT 7.5 6.8  ALBUMIN 2.8* 2.4*   No results for input(s): LIPASE, AMYLASE in the last 168 hours.  Recent Labs Lab 09/26/16 1000  AMMONIA 52*   Coagulation Profile:  Recent Labs Lab 10/04/2016 2147  INR 1.13   CBC:  Recent Labs Lab 09/21/2016 2147 09/26/16 0653 09/27/16 0423  WBC 14.8* 13.5* 15.2*  NEUTROABS 10.7*  --   --   HGB 9.9* 9.1* 8.8*  HCT 31.0* 29.4* 27.5*  MCV 91.2 89.6 91.4  PLT 308 264 280   Cardiac Enzymes: No results for input(s): CKTOTAL, CKMB, CKMBINDEX, TROPONINI in the last 168 hours. BNP: Invalid input(s): POCBNP CBG:  Recent Labs Lab 09/28/16 0748  GLUCAP 152*   HbA1C: No results for input(s): HGBA1C in the last 72 hours. Urine analysis:    Component Value Date/Time   COLORURINE AMBER (A) 09/19/2016 2145   APPEARANCEUR HAZY (A) 09/22/2016 2145   LABSPEC 1.019 10/01/2016 2145   PHURINE 6.0 09/24/2016 2145   GLUCOSEU NEGATIVE 10/02/2016 2145   HGBUR MODERATE (A) 10/10/2016 2145   BILIRUBINUR NEGATIVE 10/16/2016 2145   [address] NEGATIVE 10/10/2016 2145   PROTEINUR 30 (A) 10/11/2016 2145   NITRITE NEGATIVE 10/02/2016 2145   LEUKOCYTESUR NEGATIVE 09/18/2016 2145   Sepsis Labs: @LABRCNTIP (procalcitonin:4,lacticidven:4) ) Recent Results (from the past 240 hour(s))  Culture, blood (Routine x 2)     Status: None (Preliminary result)   Collection Time: 09/19/2016  9:47 PM  Result Value Ref Range Status    Specimen Description BLOOD LEFT FOREARM  Final   Special Requests   Final    BOTTLES DRAWN AEROBIC AND ANAEROBIC Blood Culture adequate volume   Culture   Final    NO GROWTH 2 DAYS Performed at [facility], [place] [address] [place], [place] [address]    Report Status PENDING  Incomplete  Culture, blood (Routine x 2)     Status: None (Preliminary result)   Collection Time: 10/07/2016  9:47 PM  Result Value Ref Range Status   Specimen Description BLOOD RIGHT ANTECUBITAL  Final   Special Requests   Final    BOTTLES DRAWN AEROBIC AND ANAEROBIC Blood Culture adequate volume   Culture   Final    NO GROWTH 2 DAYS Performed at [facility], [place] [address], [place], [place] [address]    Report Status PENDING  Incomplete  Culture, blood (single)     Status: None (Preliminary result)   Collection Time: 09/26/16  8:30 AM  Result Value Ref Range Status   Specimen Description BLOOD PICC  LINE  Final   Special Requests BOTTLES DRAWN AEROBIC AND ANAEROBIC BCLV  Final   Culture   Final    NO GROWTH < 24 HOURS Performed at [facility], [place] [address], [place], [place] [address]    Report Status PENDING  Incomplete     Scheduled Meds: . amLODipine  10 mg Oral Daily  . calcitonin  300 Units Subcutaneous Q12H  . enoxaparin (LOVENOX) injection  40 mg Subcutaneous Q24H  . feeding supplement (ENSURE ENLIVE)  237 mL Oral BID BM  . lactulose  30 g Oral BID  . levalbuterol  1.25 mg Nebulization BID  . levothyroxine  75 mcg Intravenous Daily  . piperacillin-tazobactam (ZOSYN)  IV  3.375 g Intravenous Q8H  . sodium chloride  1,000 mL Intravenous Once  . sodium chloride  1,000 mL Intravenous Once  . vancomycin  1,000 mg Intravenous Q12H   Continuous Infusions: . sodium chloride 0.9 % 1,000 mL with potassium chloride 20 mEq infusion      Procedures/Studies: Dg Chest 2 View  Result Date: 10/07/2016 CLINICAL DATA:  Acute onset of fever and generalized chest pain. Abdominal distention  and generalized weakness. Initial encounter. EXAM: CHEST  2 VIEW COMPARISON:  CT of the chest performed 09/04/2016 FINDINGS: The lungs are hypoexpanded. Vascular crowding and vascular congestion are seen. Bilateral pulmonary nodules are again noted throughout the lungs, reflecting known metastatic disease. Underlying airspace opacification cannot be excluded. A small right pleural effusion is again noted. No pneumothorax is seen. The heart is normal in size; the mediastinal contour is within normal limits. A left-sided chest port is noted ending about the mid to distal SVC. No acute osseous abnormalities are seen. IMPRESSION: Lungs hypoexpanded. Vascular congestion noted. Bilateral pulmonary nodules again noted throughout the lungs, reflecting known metastatic disease. Underlying pneumonia cannot be excluded, given the patient's symptoms. Small right pleural effusion again noted. Electronically Signed   By: Garald Balding M.D.   On: 09/29/2016 22:08   Ct Chest W Contrast  Result Date: 09/26/2016 CLINICAL DATA:  Acute onset of generalized abdominal pain, distention and fever. Leukocytosis. Initial encounter. EXAM: CT CHEST, ABDOMEN, AND PELVIS WITH CONTRAST TECHNIQUE: Multidetector CT imaging of the chest, abdomen and pelvis was performed following the standard protocol during bolus administration of intravenous contrast. CONTRAST:  100 mL of Isovue 300 IV contrast COMPARISON:  CT of abdomen and pelvis performed 09/04/2016 FINDINGS: CT CHEST FINDINGS Cardiovascular: The heart is normal in size. The thoracic aorta is unremarkable. No calcific atherosclerotic disease is seen. The great vessels are grossly unremarkable. Mediastinum/Nodes: An enlarged 1.6 cm subcarinal node is again noted. No additional mediastinal lymphadenopathy is appreciated. No pericardial effusion is identified. The visualized portions of the thyroid gland are unremarkable. No axillary lymphadenopathy is appreciated. A left-sided chest port is  noted. Lungs/Pleura: A small right pleural effusion is noted. Diffuse interstitial prominence may reflect mild interstitial edema. There is diffuse enlargement of innumerable pulmonary metastases, measuring up to 2.0 cm in size. No pneumothorax is seen. Musculoskeletal: No acute osseous abnormalities are identified. The visualized musculature is unremarkable in appearance. CT ABDOMEN PELVIS FINDINGS Hepatobiliary: Innumerable masses are noted throughout the liver, reflecting metastatic disease. Stones are suggested at the gallbladder. The gallbladder is decompressed and not well assessed. The common bile duct remains normal in caliber. Small volume ascites is noted within the abdomen and pelvis, increased from the prior study. Pancreas: The pancreas is within normal limits. Spleen: The spleen is unremarkable in appearance. Adrenals/Urinary Tract: The  adrenal glands are unremarkable in appearance. The kidneys are within normal limits Scattered bilateral renal cysts are noted, larger on the right. A nonobstructing 7 mm stone is noted at the interpole region of the right kidney. There is no evidence of hydronephrosis. No obstructing ureteral stones are identified. Nonspecific perinephric stranding is noted bilaterally. Stomach/Bowel: The stomach is unremarkable in appearance. The small bowel is within normal limits. The patient is status post appendectomy. The patient's ileocolic anastomosis is grossly unremarkable. Scattered diverticulosis is noted along the proximal sigmoid colon, without evidence of diverticulitis. Vascular/Lymphatic: The abdominal aorta is unremarkable in appearance. The inferior vena cava is grossly unremarkable. No retroperitoneal lymphadenopathy is seen. No pelvic sidewall lymphadenopathy is identified. Reproductive: The bladder is mildly distended and grossly unremarkable. The uterus is grossly unremarkable. No suspicious adnexal masses are seen. Other: Small bilateral inguinal hernias are  noted, containing only fat. Musculoskeletal: No acute osseous abnormalities are identified. The visualized musculature is unremarkable in appearance. IMPRESSION: 1. Small right pleural effusion again noted. Diffuse interstitial prominence may reflect mild interstitial edema. 2. Interval enlargement of innumerable pulmonary metastases, measuring up to 2.0 cm in size. 3. Innumerable masses again noted throughout the liver, reflecting metastatic disease. 4. Small volume ascites within the abdomen and pelvis is increased from the prior study. 5. Enlarged 1.6 cm subcarinal node noted. 6. Cholelithiasis suggested. 7. Scattered bilateral renal cysts. Nonobstructing 7 mm stone at the interpole region of the right kidney. 8. Scattered diverticulosis along the proximal sigmoid colon, without evidence of diverticulitis. 9. Small bilateral inguinal hernias, containing only fat. Electronically Signed   By: Garald Balding M.D.   On: 09/26/2016 00:14   Ct Chest W Contrast  Result Date: 09/04/2016 CLINICAL DATA:  Colon cancer diagnosed 5/6 years ago. Ascending colonic primary. Restaging. Diabetes. Hypertension. Anemia. EXAM: CT CHEST, ABDOMEN, AND PELVIS WITH CONTRAST TECHNIQUE: Multidetector CT imaging of the chest, abdomen and pelvis was performed following the standard protocol during bolus administration of intravenous contrast. CONTRAST:  155mL ISOVUE-300 IOPAMIDOL (ISOVUE-300) INJECTION 61% COMPARISON:  07/07/2016 FINDINGS: CT CHEST FINDINGS Cardiovascular: Tortuous thoracic aorta. Normal heart size, without pericardial effusion. No central pulmonary embolism, on this non-dedicated study. Mediastinum/Nodes: No supraclavicular adenopathy. Right paratracheal node is similar at 9 mm on image 18/series 2. No hilar adenopathy. Low periesophageal node is similar at 12 mm on image 37/series 2. Lungs/Pleura: Small right pleural effusion is minimally increased. Trace left pleural fluid is new. Mild motion degradation throughout.  Extensive bilateral pulmonary metastasis. Index left upper lobe nodule measures 1.6 cm on image 56/series 6. Compare 1.4 cm on the prior. Index right lower lobe nodule measures 1.4 cm on image 56/series 6. Compare 1.2 cm on the prior. More cephalad left upper lobe index nodule measures 1.3 cm on image 41/series 6 versus 1.1 cm on the prior. No lobar consolidation. Musculoskeletal: No acute osseous abnormality. CT ABDOMEN PELVIS FINDINGS Hepatobiliary: Hepatomegaly at 22.0 cm craniocaudal. Extensive hepatic metastasis. Index high right hepatic lobe lesion measures 2.9 x 2.4 cm on image 40/series 2. Compare 2.7 x 2.1 cm on the prior exam (when remeasured). Calcified lesion just cephalad to the gallbladder is similar at 3.3 cm on image 58/series 2. Anterior left hepatic lobe 1.5 cm lesion image 54/series 2 is felt to be minimally enlarged from 1.4 cm previously. More central left lateral liver or lesion measures 1.7 cm on image 55/series 2 and is similar. Cholelithiasis without cholecystitis or biliary duct dilatation. Pancreas: Normal, without mass or ductal dilatation. Spleen: Subcapsular subcentimeter low-density  splenic lesion is unchanged and of doubtful clinical significance. Adrenals/Urinary Tract: Normal adrenal glands. Right renal cysts. An interpolar right renal lesion measures 1.7 cm on image 78/series 2 and demonstrates complexity. This is unchanged in size to on the prior and similar in size back to 03/05/2012. No hydronephrosis. Normal urinary bladder. Stomach/Bowel: Mild motion degradation continuing into the upper abdomen. Portions of the stomach are collapsed. Colonic stool burden suggests constipation. Partial right hemicolectomy. Normal small bowel. Vascular/Lymphatic: Aortic and branch vessel atherosclerosis. Upper abdominal adenopathy, including a gastrohepatic ligament node of 1.6 cm on image 52/series 2. This is felt to be similar to the prior exam (when remeasured). No pelvic sidewall  adenopathy. Reproductive: Normal uterus and adnexa. Other: Small volume abdominopelvic fluid is unchanged. No specific findings for omental or peritoneal metastasis. Small bilateral fat containing inguinal hernias. Musculoskeletal: No acute osseous abnormality. 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 IMPRESSION: 1. Mild progression of pulmonary metastasis. 2. Similar to minimal progression of hepatic metastasis. 3. Similar lower thoracic and upper abdominal nodal metastasis. 4. Mild motion degradation. 5. Similar small volume abdominopelvic fluid. 6. Increase in small right pleural effusion with new trace left pleural fluid. 7. Cholelithiasis. 8.  Possible constipation. Electronically Signed   By: Abigail Miyamoto M.D.   On: 09/04/2016 19:39   Ct Abdomen Pelvis W Contrast  Result Date: 09/26/2016 CLINICAL DATA:  Acute onset of generalized abdominal pain, distention and fever. Leukocytosis. Initial encounter. EXAM: CT CHEST, ABDOMEN, AND PELVIS WITH CONTRAST TECHNIQUE: Multidetector CT imaging of the chest, abdomen and pelvis was performed following the standard protocol during bolus administration of intravenous contrast. CONTRAST:  100 mL of Isovue 300 IV contrast COMPARISON:  CT of abdomen and pelvis performed 09/04/2016 FINDINGS: CT CHEST FINDINGS Cardiovascular: The heart is normal in size. The thoracic aorta is unremarkable. No calcific atherosclerotic disease is seen. The great vessels are grossly unremarkable. Mediastinum/Nodes: An enlarged 1.6 cm subcarinal node is again noted. No additional mediastinal lymphadenopathy is appreciated. No pericardial effusion is identified. The visualized portions of the thyroid gland are unremarkable. No axillary lymphadenopathy is appreciated. A left-sided chest port is noted. Lungs/Pleura: A small right pleural effusion is noted. Diffuse interstitial prominence may reflect mild interstitial edema. There is diffuse enlargement of innumerable  pulmonary metastases, measuring up to 2.0 cm in size. No pneumothorax is seen. Musculoskeletal: No acute osseous abnormalities are identified. The visualized musculature is unremarkable in appearance. CT ABDOMEN PELVIS FINDINGS Hepatobiliary: Innumerable masses are noted throughout the liver, reflecting metastatic disease. Stones are suggested at the gallbladder. The gallbladder is decompressed and not well assessed. The common bile duct remains normal in caliber. Small volume ascites is noted within the abdomen and pelvis, increased from the prior study. Pancreas: The pancreas is within normal limits. Spleen: The spleen is unremarkable in appearance. Adrenals/Urinary Tract: The adrenal glands are unremarkable in appearance. The kidneys are within normal limits Scattered bilateral renal cysts are noted, larger on the right. A nonobstructing 7 mm stone is noted at the interpole region of the right kidney. There is no evidence of hydronephrosis. No obstructing ureteral stones are identified. Nonspecific perinephric stranding is noted bilaterally. Stomach/Bowel: The stomach is unremarkable in appearance. The small bowel is within normal limits. The patient is status post appendectomy. The patient's ileocolic anastomosis is grossly unremarkable. Scattered diverticulosis is noted along the proximal sigmoid colon, without evidence of diverticulitis. Vascular/Lymphatic: The abdominal aorta is unremarkable in appearance. The inferior vena cava is grossly unremarkable. No retroperitoneal lymphadenopathy is seen.  No pelvic sidewall lymphadenopathy is identified. Reproductive: The bladder is mildly distended and grossly unremarkable. The uterus is grossly unremarkable. No suspicious adnexal masses are seen. Other: Small bilateral inguinal hernias are noted, containing only fat. Musculoskeletal: No acute osseous abnormalities are identified. The visualized musculature is unremarkable in appearance. IMPRESSION: 1. Small right  pleural effusion again noted. Diffuse interstitial prominence may reflect mild interstitial edema. 2. Interval enlargement of innumerable pulmonary metastases, measuring up to 2.0 cm in size. 3. Innumerable masses again noted throughout the liver, reflecting metastatic disease. 4. Small volume ascites within the abdomen and pelvis is increased from the prior study. 5. Enlarged 1.6 cm subcarinal node noted. 6. Cholelithiasis suggested. 7. Scattered bilateral renal cysts. Nonobstructing 7 mm stone at the interpole region of the right kidney. 8. Scattered diverticulosis along the proximal sigmoid colon, without evidence of diverticulitis. 9. Small bilateral inguinal hernias, containing only fat. Electronically Signed   By: Garald Balding M.D.   On: 09/26/2016 00:14   Ct Abdomen Pelvis W Contrast  Result Date: 09/04/2016 CLINICAL DATA:  Colon cancer diagnosed 5/6 years ago. Ascending colonic primary. Restaging. Diabetes. Hypertension. Anemia. EXAM: CT CHEST, ABDOMEN, AND PELVIS WITH CONTRAST TECHNIQUE: Multidetector CT imaging of the chest, abdomen and pelvis was performed following the standard protocol during bolus administration of intravenous contrast. CONTRAST:  111mL ISOVUE-300 IOPAMIDOL (ISOVUE-300) INJECTION 61% COMPARISON:  07/07/2016 FINDINGS: CT CHEST FINDINGS Cardiovascular: Tortuous thoracic aorta. Normal heart size, without pericardial effusion. No central pulmonary embolism, on this non-dedicated study. Mediastinum/Nodes: No supraclavicular adenopathy. Right paratracheal node is similar at 9 mm on image 18/series 2. No hilar adenopathy. Low periesophageal node is similar at 12 mm on image 37/series 2. Lungs/Pleura: Small right pleural effusion is minimally increased. Trace left pleural fluid is new. Mild motion degradation throughout. Extensive bilateral pulmonary metastasis. Index left upper lobe nodule measures 1.6 cm on image 56/series 6. Compare 1.4 cm on the prior. Index right lower lobe nodule  measures 1.4 cm on image 56/series 6. Compare 1.2 cm on the prior. More cephalad left upper lobe index nodule measures 1.3 cm on image 41/series 6 versus 1.1 cm on the prior. No lobar consolidation. Musculoskeletal: No acute osseous abnormality. CT ABDOMEN PELVIS FINDINGS Hepatobiliary: Hepatomegaly at 22.0 cm craniocaudal. Extensive hepatic metastasis. Index high right hepatic lobe lesion measures 2.9 x 2.4 cm on image 40/series 2. Compare 2.7 x 2.1 cm on the prior exam (when remeasured). Calcified lesion just cephalad to the gallbladder is similar at 3.3 cm on image 58/series 2. Anterior left hepatic lobe 1.5 cm lesion image 54/series 2 is felt to be minimally enlarged from 1.4 cm previously. More central left lateral liver or lesion measures 1.7 cm on image 55/series 2 and is similar. Cholelithiasis without cholecystitis or biliary duct dilatation. Pancreas: Normal, without mass or ductal dilatation. Spleen: Subcapsular subcentimeter low-density splenic lesion is unchanged and of doubtful clinical significance. Adrenals/Urinary Tract: Normal adrenal glands. Right renal cysts. An interpolar right renal lesion measures 1.7 cm on image 78/series 2 and demonstrates complexity. This is unchanged in size to on the prior and similar in size back to 03/05/2012. No hydronephrosis. Normal urinary bladder. Stomach/Bowel: Mild motion degradation continuing into the upper abdomen. Portions of the stomach are collapsed. Colonic stool burden suggests constipation. Partial right hemicolectomy. Normal small bowel. Vascular/Lymphatic: Aortic and branch vessel atherosclerosis. Upper abdominal adenopathy, including a gastrohepatic ligament node of 1.6 cm on image 52/series 2. This is felt to be similar to the prior exam (when remeasured). No pelvic  sidewall adenopathy. Reproductive: Normal uterus and adnexa. Other: Small volume abdominopelvic fluid is unchanged. No specific findings for omental or peritoneal metastasis. Small  bilateral fat containing inguinal hernias. Musculoskeletal: No acute osseous abnormality. 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 IMPRESSION: 1. Mild progression of pulmonary metastasis. 2. Similar to minimal progression of hepatic metastasis. 3. Similar lower thoracic and upper abdominal nodal metastasis. 4. Mild motion degradation. 5. Similar small volume abdominopelvic fluid. 6. Increase in small right pleural effusion with new trace left pleural fluid. 7. Cholelithiasis. 8.  Possible constipation. Electronically Signed   By: Abigail Miyamoto M.D.   On: 09/04/2016 19:39   US Abdomen Limited  Result Date: 09/26/2016 CLINICAL DATA:  History of metastatic colon cancer, request to evaluate for ascites. EXAM: LIMITED ABDOMEN ULTRASOUND FOR ASCITES TECHNIQUE: Limited ultrasound survey for ascites was performed in all four abdominal quadrants. COMPARISON:  None. FINDINGS: No ascitic fluid throughout all 4 quadrants. IMPRESSION: No ascites.  No paracentesis performed. Read by: Saverio Danker, PA-C Electronically Signed   By: Corrie Mckusick D.O.   On: 09/26/2016 11:26    Kendan Cornforth, DO  [facility] Pager [phone]  If 7PM-7AM, please contact night-coverage [email or web address] Password [email or web address] 09/28/2016, 8:28 AM   LOS: 2 days

## 2016-09-28 NOTE — Progress Notes (Signed)
Pt refused CT angio of chest and the abdominal x-ray. Pt stated that she wasn't going to do it today. MD informed

## 2016-09-28 NOTE — Progress Notes (Signed)
CRITICAL VALUE ALERT  Critical value received:  Lactic acid 2.6   Date of notification:  09/28/16  Time of notification:  2006  Critical value read back:Yes.    Nurse who received alert:  S.Finneus Kaneshiro,RN  MD notified (1st page):  K.Kirby,NP  Time of first page:  2012  MD notified (2nd page):  Time of second page:  Responding MD:  Josephine Cables  Time MD responded:  2024

## 2016-09-28 NOTE — Progress Notes (Signed)
IP PROGRESS NOTE  Subjective:   Judith Stephens has chills this morning. She is alert. Her husband is at the bedside. She is having bowel movements.  Objective: Vital signs in last 24 hours: Blood pressure (!) 174/90, pulse (!) 122, temperature 98.5 F (36.9 C), temperature source Oral, resp. rate (!) 21, height 5\' 11"  (1.803 m), weight 173 lb 8 oz (78.7 kg), SpO2 98 %.  Intake/Output from previous day: 04/11 0701 - 04/12 0700 In: 2786.7 [P.O.:510; I.V.:1156.7; IV Piggyback:1120] Out: -   Physical Exam:  HEENT: No thrush or ulcers Lungs: Clear bilaterally, no respiratory distress Cardiac: Regular rate and rhythm, tachycardia Abdomen: The liver is markedly enlarged, the abdomen is distended Extremities: No leg edema Neurologic: Alert, oriented to place and situation  Portacath/PICC-without erythema  Lab Results:  Recent Labs  09/27/16 0423 09/28/16 0914  WBC 15.2* 18.3*  HGB 8.8* 9.2*  HCT 27.5* 29.0*  PLT 280 302    BMET  Recent Labs  09/27/16 0423 09/28/16 0446  NA 141 140  K 3.8 3.7  CL 108 108  CO2 24 23  GLUCOSE 80 180*  BUN 12 12  CREATININE 0.63 0.59  CALCIUM 10.8* 10.4*    Studies/Results: US Abdomen Limited  Result Date: 09/26/2016 CLINICAL DATA:  History of metastatic colon cancer, request to evaluate for ascites. EXAM: LIMITED ABDOMEN ULTRASOUND FOR ASCITES TECHNIQUE: Limited ultrasound survey for ascites was performed in all four abdominal quadrants. COMPARISON:  None. FINDINGS: No ascitic fluid throughout all 4 quadrants. IMPRESSION: No ascites.  No paracentesis performed. Read by: Saverio Danker, PA-C Electronically Signed   By: Corrie Mckusick D.O.   On: 09/26/2016 11:26    Medications: I have reviewed the patient's current medications.  Assessment/Plan:  1. Metastatic colon cancer, cycle 1 of salvage therapy with Lonsurf initiated 09/18/2016  CT chest/abdomen 09/26/2016-progressive enlargement of lung nodules, diffuse metastatic disease  involving the liver 2. Abdominal pain likely secondary to extensive liver metastases  3. Fever-possibly tumor fever, possible transient bacteremia related to tumor, no source for infection identified, cultures negative to date  4. Diabetes  5. Anemia secondary to chronic disease, malnutrition, and chemotherapy  6. Hypercalcemia-likely secondary metastatic colon cancer, chronic, has been asymptomatic as an outpatient, improved  Judith Stephens is alert and oriented this morning. Her prognosis from the metastatic colon cancer is poor. Treatment options are very limited. I recommend Hospice care. Judith Stephens and her husband agree to a [facility] referral for home care.  Her mental status appears improved today. She has tachycardia and rigors this morning, though she is not febrile. Cultures from admission remained negative. There is no apparent source for infection. Her symptoms may be related to metastatic carcinoma.  The diarrhea could be related to the [place] last week. I will stop the lactulose and calcitonin.    Recommendations: 1. Continue antibiotics, follow-up cultures 2. Intravenous hydration, antidiarrhea agents  3.  [facility] referral for home care    I will be out until 10/02/2016. Please call Oncology as needed    LOS: 2 days   Betsy Coder, MD   09/28/2016, 9:49 AM

## 2016-09-28 NOTE — Progress Notes (Signed)
Called to see pt due to tachycardia in 160-170s -pt is confused but denies cp or sob  HR164--RR20-164/102--97%RA CV-RRR, tachy Lung-bibasilar crackles abd--soft, mild distension, +BS Ext-no CC/E  -pt "refused" CT angio earlier, but pt is confused--not able to make decisions at thistime -re-order CT angio chest--haldol 2 mg IV q 6 prn agitation -EKG--SVT -start IV diltiazem drip after bolus 10 mg -transfer to stepdown -echo EF 65%-70%, no WMA -TSH 1.094 -reorder 2 view abd xray  WBC rising--repeat blood culture, UA, culture --check lactate, procalcitonin -continue IVF and IV abx  DTat

## 2016-09-28 NOTE — Significant Event (Addendum)
Rapid Response Event Note  Overview:  Called from bedside RN at approximately 1815 in regards to patient's HR 170s-180s. Patient less responsive but answered questions. EKG being obtained, MD paged and orders placed for Cardizem IV drip. SDU transfer.    Initial Focused Assessment: Patient was laying in bed, breathing labored, abdomen distended. Able to answer questions appropriately. Oriented x 4, pulses weak and thready. EKG showed SVT. Primary RN at bedside. Additional IV access being obtained.     Interventions: Cardizem bolus 10mg , initiate at 5mg  per IV 10mg  Bolus of Cardizem given at 1831  5mg /hr continuous Cardizem IV drip initiated at [place] of Care (if not transferred):  Event Summary:   at  1820 BP 184/100, Resp 42, Saturations 96%, 178 HR    at   1830 BP 168/97, Resp 39, Saturations 96%, HR 170s   at 1840 BP 164/91, Resp 40, Saturations 98%, HR 118   After Cardizem bolus, patient's HR began to decrease. Patient converted out of SVT to ST.     Placed on [place] 2Liters before transferring to SDU.  Genelle Economou C

## 2016-09-29 ENCOUNTER — Telehealth: Payer: Self-pay | Admitting: *Deleted

## 2016-09-29 ENCOUNTER — Inpatient Hospital Stay (HOSPITAL_COMMUNITY): Payer: Medicare Other

## 2016-09-29 DIAGNOSIS — N179 Acute kidney failure, unspecified: Secondary | ICD-10-CM

## 2016-09-29 DIAGNOSIS — Z515 Encounter for palliative care: Secondary | ICD-10-CM

## 2016-09-29 DIAGNOSIS — Z7189 Other specified counseling: Secondary | ICD-10-CM

## 2016-09-29 LAB — COMPREHENSIVE METABOLIC PANEL
ALT: 61 U/L — AB (ref 14–54)
AST: 415 U/L — AB (ref 15–41)
Albumin: 2.5 g/dL — ABNORMAL LOW (ref 3.5–5.0)
Alkaline Phosphatase: 174 U/L — ABNORMAL HIGH (ref 38–126)
Anion gap: 8 (ref 5–15)
BILIRUBIN TOTAL: 1.6 mg/dL — AB (ref 0.3–1.2)
BUN: 17 mg/dL (ref 6–20)
CALCIUM: 9 mg/dL (ref 8.9–10.3)
CO2: 20 mmol/L — ABNORMAL LOW (ref 22–32)
CREATININE: 1.29 mg/dL — AB (ref 0.44–1.00)
Chloride: 112 mmol/L — ABNORMAL HIGH (ref 101–111)
GFR, EST AFRICAN AMERICAN: 46 mL/min — AB (ref >60)
GFR, EST NON AFRICAN AMERICAN: 39 mL/min — AB (ref >60)
Glucose, Bld: 83 mg/dL (ref 65–99)
Potassium: 3.6 mmol/L (ref 3.5–5.1)
Sodium: 140 mmol/L (ref 135–145)
TOTAL PROTEIN: 7 g/dL (ref 6.5–8.1)

## 2016-09-29 LAB — CBC
HEMATOCRIT: 26.9 % — AB (ref 36.0–46.0)
HEMOGLOBIN: 8.4 g/dL — AB (ref 12.0–15.0)
MCH: 27.8 pg (ref 26.0–34.0)
MCHC: 31.2 g/dL (ref 30.0–36.0)
MCV: 89.1 fL (ref 78.0–100.0)
PLATELETS: 278 10*3/uL (ref 150–400)
RBC: 3.02 MIL/uL — AB (ref 3.87–5.11)
RDW: 17 % — ABNORMAL HIGH (ref 11.5–15.5)
WBC: 22.8 10*3/uL — AB (ref 4.0–10.5)

## 2016-09-29 LAB — VANCOMYCIN, RANDOM: Vancomycin Rm: 21

## 2016-09-29 LAB — LACTIC ACID, PLASMA
LACTIC ACID, VENOUS: 2.7 mmol/L — AB (ref 0.5–1.9)
Lactic Acid, Venous: 2.9 mmol/L (ref 0.5–1.9)
Lactic Acid, Venous: 2.7 mmol/L (ref 0.5–1.9)

## 2016-09-29 LAB — HEMOGLOBIN A1C
Hgb A1c MFr Bld: 5.1 % (ref 4.8–5.6)
MEAN PLASMA GLUCOSE: 100 mg/dL

## 2016-09-29 LAB — GLUCOSE, CAPILLARY: Glucose-Capillary: 71 mg/dL (ref 65–99)

## 2016-09-29 MED ORDER — DILTIAZEM LOAD VIA INFUSION
10.0000 mg | Freq: Once | INTRAVENOUS | Status: AC
Start: 2016-09-29 — End: 2016-09-29
  Administered 2016-09-29: 10 mg via INTRAVENOUS
  Filled 2016-09-29: qty 10

## 2016-09-29 MED ORDER — SODIUM CHLORIDE 0.9 % IV BOLUS (SEPSIS)
500.0000 mL | Freq: Once | INTRAVENOUS | Status: AC
  Administered 2016-09-29: 500 mL via INTRAVENOUS

## 2016-09-29 MED ORDER — FUROSEMIDE 10 MG/ML IJ SOLN
40.0000 mg | Freq: Once | INTRAMUSCULAR | Status: AC
  Administered 2016-09-29: 40 mg via INTRAVENOUS
  Filled 2016-09-29: qty 4

## 2016-09-29 MED ORDER — SODIUM CHLORIDE 0.9 % IV BOLUS (SEPSIS)
500.0000 mL | Freq: Once | INTRAVENOUS | Status: AC
Start: 2016-09-29 — End: 2016-09-29
  Administered 2016-09-29: 500 mL via INTRAVENOUS

## 2016-09-29 NOTE — Progress Notes (Addendum)
K.Kirby,NP notified of pt having a rectal temp of 102.1 before receiving tylenol and lactic acid 2.9. Temp -100.3 after tylenol. Pt resting in bed. Order given for 500cc bolus.

## 2016-09-29 NOTE — Progress Notes (Signed)
Notified by Julianne Handler of family request for Hospice and [place] services at home after discharge. Chart and patient information reviewed and eligibility confirmed with Dr. Alferd Patee.  Spoke to Dr. Carles Collet re: patient's current medically frail state.  Plan at this time is for PMT to assist in determining an appropriate disposition.  Will be available to assist in discharge planning as appropriate.  Please call with any questions.   Thank You,  Freddi Starr RN, [facility] Liaison  [phone]

## 2016-09-29 NOTE — Progress Notes (Signed)
Patient spit food out of mouth. She took a few sips of Ensure, however she had difficulty managing her secretions after oral intake. Will continue to monitor patient.

## 2016-09-29 NOTE — Consult Note (Signed)
Consultation Note Date: 09/29/2016   Patient Name: Judith Stephens  DOB: 1940/07/01  MRN: [id number]  Age / Sex: 76 y.o., female  PCP: Judith Coffin, MD Referring Physician: Orson Eva, MD  Reason for Consultation: Establishing goals of care  HPI/Patient Profile: 76 y.o. female  with past medical history of Metastatic colon cancer (liver and lung), hypertension, hyperlipidemia, diabetes, hypothyroidism admitted on 10/13/2016 with nausea, vomiting, diarrhea, shortness of breath.  Her condition has continued to worsen. Palliative consulted for goals of care.   Clinical Assessment and Goals of Care: I met today with patient's husband. We discussed clinical course as well as wishes moving forward in light of [name]'s continued decline.    We discussed difference between a aggressive medical intervention path and a palliative, comfort focused care path.  Values and goals of care important to patient and family were attempted to be elicited.  Concept of Hospice and Palliative Care were discussed  Questions and concerns addressed.   PMT will continue to support holistically.  SUMMARY OF RECOMMENDATIONS   - I shared with Judith Stephens that I believe his wife is approaching the end-of-life regardless of interventions moving forward. He reports that she has said she wants to be home so he believes being comfortable and out of the hospital would be very important to her.  - He has discussed with Judith Stephens today already, and he and I further discussed plan for hospice support and working to figure out where she would best be served outside of the hospital.  We discussed options for home with hospice versus residential hospice. - I think her prognosis is likely limited to days to weeks at best and she is going to continue to have increasing symptom burden. I think she would be well served by residential hospice placement for  end-of-life care if this is her husband's wishes. - Plan to continue with current care while he discusses with his family tonight.  I did share with him that I am concerned she is going to continue to decline despite current medical interventions. - He would like to follow-up with representative from hospice tomorrow late morning or early afternoon discuss home hospice versus Judith Stephens. He has an appointment tomorrow at 8:30 that he needs to attend and will be coming to the hospital after this.  Code Status/Advance Care Planning:  DNR   Symptom Management:   Pain/SOB: Addition of fentanyl prn.  Palliative Prophylaxis:   Frequent Pain Assessment  Psycho-social/Spiritual:   Desire for further Chaplaincy support:no  Additional Recommendations: Education on Hospice  Prognosis:   Hours - Days  Discharge Planning: To Be Determined      Primary Diagnoses: Present on Admission: . Sepsis (Judith Stephens) . Hypothyroidism . Lung metastasis (Judith Stephens) . Liver metastasis (Judith Stephens) . Colon cancer (Judith Stephens) . Normocytic anemia . Abdominal pain . Nausea, vomiting and diarrhea . Cough . Hypercalcemia . HTN (hypertension)   I have reviewed the medical record, interviewed the patient and family, and examined the patient. The following aspects are  pertinent.  Past Medical History:  Diagnosis Date  . Anemia    hx of blood transfusion 03/06/12   . Anxiety   . Arthritis   . Blood transfusion   . Cancer [facility])    colon cancer  . Diabetes mellitus   . Hyperlipidemia   . Hypertension   . Hypothyroidism    Social History   Social History  . Marital status: Married    Spouse name: N/A  . Number of children: N/A  . Years of education: N/A   Social History Main Topics  . Smoking status: Former Smoker    Packs/day: 0.25    Types: Cigarettes  . Smokeless tobacco: Never Used  . Alcohol use No     Comment: occasional wine   . Drug use: No  . Sexual activity: Not Asked   Other Topics Concern    . None   Social History Narrative   Married    Retired from [employer] History  Problem Relation Age of Onset  . Hypertension Father   . Hypertension Sister     x3  . Hypertension Brother    Scheduled Meds: . enoxaparin (LOVENOX) injection  40 mg Subcutaneous Q24H  . feeding supplement (ENSURE ENLIVE)  237 mL Oral BID BM  . furosemide  40 mg Intravenous Once  . levalbuterol  1.25 mg Nebulization BID  . levothyroxine  75 mcg Intravenous Daily  . sodium chloride  1,000 mL Intravenous Once   Continuous Infusions: . diltiazem (CARDIZEM) infusion 15 mg/hr (09/29/16 1600)   PRN Meds:.acetaminophen **OR** acetaminophen, haloperidol lactate, hydrALAZINE, levalbuterol, ondansetron, sodium chloride flush Medications Prior to Admission:  Prior to Admission medications   Medication Sig Start Date End Date Taking? Authorizing Provider  amLODipine (NORVASC) 10 MG tablet Take 10 mg by mouth daily. 08/21/16  Yes Historical Provider, MD  aspirin EC 81 MG tablet Take 81 mg by mouth daily.    Yes Historical Provider, MD  feeding supplement, ENSURE ENLIVE, (ENSURE ENLIVE) LIQD Take 237 mLs by mouth 2 (two) times daily between meals. 03/19/16  Yes Hosie Poisson, MD  fluticasone (FLONASE) 50 MCG/ACT nasal spray Place 2 sprays into the nose daily as needed for rhinitis. For allergies   Yes Historical Provider, MD  glipiZIDE (GLUCOTROL XL) 10 MG 24 hr tablet Take 10 mg by mouth daily. 07/20/16  Yes Historical Provider, MD  HYDROcodone-acetaminophen (NORCO/VICODIN) 5-325 MG tablet Take 1 tablet by mouth every 4 (four) hours as needed for moderate pain or severe pain. 06/27/16  Yes Owens Shark, NP  KLOR-CON M20 20 MEQ tablet Take 20 mEq by mouth daily. 06/20/16  Yes Historical Provider, MD  levothyroxine (SYNTHROID, LEVOTHROID) 175 MCG tablet Take 175 mcg by mouth daily. 09/16/16  Yes Historical Provider, MD  lidocaine-prilocaine (EMLA) cream Apply 1 application topically as needed. Apply 1 hr  prior to port access and cover with plastic wrap 07/11/16  Yes Owens Shark, NP  loratadine (CLARITIN) 10 MG tablet Take 10 mg by mouth daily as needed for allergies.    Yes Historical Provider, MD  LORazepam (ATIVAN) 0.5 MG tablet Take 1 tablet (0.5 mg total) by mouth at bedtime as needed for anxiety or sleep. 05/16/16  Yes Ladell Pier, MD  montelukast (SINGULAIR) 10 MG tablet TAKE 1 TABLET BY MOUTH EVERY EVENING 05/10/16  Yes Ladell Pier, MD  rosuvastatin (CRESTOR) 10 MG tablet Take 5 mg by mouth daily. 07/19/16  Yes Historical Provider, MD  trifluridine-tipiracil (LONSURF) 20-8.19  MG tablet Take 3 tablets (60 mg of trifluridine total) by mouth 2 (two) times daily after a meal. 1 hr after meals on days 1-5, 8-12, every 28days 09/08/16  Yes Ladell Pier, MD  ondansetron (ZOFRAN) 8 MG tablet Take 1 tablet (8 mg total) by mouth every 8 (eight) hours as needed for nausea or vomiting. Patient not taking: Reported on 09/05/2016 08/28/16   Ladell Pier, MD  prochlorperazine (COMPAZINE) 5 MG tablet Take 1 tablet (5 mg total) by mouth every 6 (six) hours as needed for nausea or vomiting. Patient not taking: Reported on 09/05/2016 06/27/16   [place] Shark, NP   Allergies  Allergen Reactions  . No Known Allergies Other (See Comments)   Review of Systems  Unable to assess  Physical Exam  General: Alert, awake, in no acute distress.  HEENT: No bruits, no goiter, no JVD Heart: Regular rate and rhythm. No murmur appreciated. Lungs: Fair air movement, crackles in bases Abdomen: Soft, nontender, nondistended, positive bowel sounds.  Ext: No significant edema Skin: Warm and dry  Vital Signs: BP (!) 122/58   Pulse 95   Temp 98.8 F (37.1 C)   Resp (!) 29   Ht '5\' 10"'$  (1.778 m)   Wt 78.7 kg (173 lb 8 oz)   SpO2 92%   BMI 24.20 kg/m  Pain Assessment: No/denies pain   Pain Score: 0-No pain   SpO2: SpO2: 92 % O2 Device:SpO2: 92 % O2 Flow Rate: .O2 Flow Rate (L/min): 2 L/min  IO:  Intake/output summary:  Intake/Output Summary (Last 24 hours) at 09/29/16 1824 Last data filed at 09/29/16 1600  Gross per 24 hour  Intake          3788.67 ml  Output              200 ml  Net          3588.67 ml    LBM: Last BM Date: 09/28/16 Baseline Weight: Weight: 78.7 kg (173 lb 8 oz) Most recent weight: Weight: 78.7 kg (173 lb 8 oz)     Palliative Assessment/Data:   Flowsheet Rows     Most Recent Value  Intake Tab  Referral Department  Hospitalist  Unit at Time of Referral  Intermediate Care Unit  Palliative Care Primary Diagnosis  Cancer  Date Notified  09/29/16  Palliative Care Type  New Palliative care  Reason for referral  Clarify Goals of Care, Counsel Regarding Hospice  Date of Admission  10/07/2016  Date first seen by Palliative Care  09/29/16  # of days Palliative referral response time  0 Day(s)  # of days IP prior to Palliative referral  4  Clinical Assessment  Palliative Performance Scale Score  10%  Pain Max last 24 hours  Not able to report  Pain Min Last 24 hours  Not able to report  Psychosocial & Spiritual Assessment  Palliative Care Outcomes  Patient/Family meeting held?  Yes  Who was at the meeting?  husband      Time In: 17 Time Out: 1730 Time Total: 60 Greater than 50%  of this time was spent counseling and coordinating care related to the above assessment and plan.  Signed by: Micheline Rough, MD   Please contact Palliative Medicine Team phone at [phone] for questions and concerns.  For individual provider: See Shea Evans

## 2016-09-29 NOTE — Progress Notes (Signed)
CRITICAL VALUE ALERT  Critical value received: Lactic acid-2.9   Date of notification:  09/29/16  Time of notification:  0017  Critical value read back:Yes.    Nurse who received alert:  S.Arne Schlender,RN  MD notified (1st page):  Josephine Cables  Time of first page:  0032  MD notified (2nd page):  Time of second page:  Responding MD:  K.Kirby,NP  Time MD responded:  [phone]

## 2016-09-29 NOTE — Progress Notes (Signed)
HR sustaining in the 140s with no change after increasing Cardizem drip to 15mg /hr.  K.Kirby,NP notified, order obtained for 10mg  bolus. HR now decreased ranging between 109-111.  BP 146/95, R-32, 99%-2L. Pt resting in bed with no complaints at this time.

## 2016-09-29 NOTE — Telephone Encounter (Signed)
Message from Amy with hospice admissions asking if Dr. Benay Spice will be attending MD for hospice. Yes.

## 2016-09-29 NOTE — Progress Notes (Addendum)
Called to see patient due to "wheezing" Pt denies cp.  States she is "a little" sob  CV-RRR -Lung--bibasilar crackles, minimal basilar wheeze Ext--1 +LE edema  CXR--pulmonary mets; ?increase interstitial markings--not much change from prior  Pt now with new oxygen requirement -Lasix 40 mg IV x 1; stop IVF -husband updated at bedside  DTat

## 2016-09-29 NOTE — Progress Notes (Signed)
CRITICAL VALUE ALERT  Critical value received: Lactic acid-2.7   Date of notification:  09/29/16  Time of notification:  7092  Critical value read back:Yes.    Nurse who received alert:  S.Ashish Rossetti,RN  MD notified (1st page):    Time of first page:    MD notified (2nd page):  Time of second page:  Responding MD:    Time MD responded:

## 2016-09-29 NOTE — Progress Notes (Signed)
CRITICAL VALUE ALERT  Critical value received:  LA 2.7  Date of notification:  09/28/16  Time of notification:  0602  Critical value read back:Yes.    Nurse who received alert:  Kennis Carina, RN > notified Cristal Generous, RN   MD notified (1st page):  Tylene Fantasia, NP  Time of first page:  0603   MD notified (2nd page):  Time of second page:  Responding MD: No new orders at this time.   Time MD responded:

## 2016-09-29 NOTE — Progress Notes (Signed)
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 The patient was also confused with abdominal pain for 3 days as well. Upon presentation, the patient was noted to have WBC 14.8 with lactic acid 4.0 and temperature 101.44F. Therewas concern for sepsis. The patient was fluid resuscitated and started on intravenous vancomycin and Zosyn.  Assessment/Plan: SIRS -Patient presented with elevated lactic acid, fever, sinus tachycardia -Continue empiric vancomycin and Zosyn pending culture data -Urinalysis negative for pyuria -Chest x-ray negative for consolidation -CT chest shows diffuse interstitial prominence with innumerable pulmonary metastasis. -CT abdomen and pelvis revealed cholelithiasis with normal CBDand innumerable hepatic metastasis. There were small volume ascites. There was diverticulosis with small bilateral inguinal hernias. -CTA chest--neg PE, RLL atelectasis, small R-effusion -Lactic acid remains elevated due to AKI -viral respiratory panel--neg -blood cultures remain neg -d/c abx--no clear source of infection  Acute metabolic encephalopathy -Secondary to hypercalcemia, renal failure, hyperammonemia -not much improvement in past 24-48 hours  Hypercalcemia of malignancy -continue calcitonin [place] x 4 doses total -pamidronate x 1 dose -am CMP -corrected calcium 11.68-->10.2 -continue IVF  AKI -multifactorial including contrast, antibiotics, volume depletion -continue IVF -renal US -am CMP  Hyperammonemia -a sign of progressive hepatic failure -unclear clinical significance as pt's mental status improving before lactulose -recheck  ammonia--52>>>39  SVT -4/12 evening--developed SVT--started diltiazem drim -continue diltiazem drip through today -spontaneously converted to sinus 4/13 -Echo -TSH--1.094 -CT angio chest--neg PE  Vomiting and diarrhea -Resolved -related to [place] and hypercalcemia -Ultrasound abdomen--no ascites -2 view abd--unremarkable bowel gas pattern  Transaminasemia/hyperbilirubinemia -due to hepatic mets and suspect impending hepatic failure -continues to worsen -CT abd without signs of cholecystitis  Leukocytosis -d/c all abx and observe clinically -likely due to acute medical condition -cultures remain neg; no clear source of infection  Chronic diastolic CHF  -September 7408 echo EF 60-65%, grade 1 DD  Diabetes mellitus type 2 -Check hemoglobin A1c--5.1 -Previously on glipizide as an outpatient -ISS  Hypertension -controlled with diltiazem drip  Hypothyroidism -Continue Synthroid  Goals of Care -consulted palliative medicine -pt continues to develop complications -suspect she will qualify for residential hospice if husband agrees -case discussed with Dr. Domingo Cocking    Disposition Plan: home with hospice vs residential hospice  Family Communication: none today--Total time spent 35 minutes.  Greater than 50% spent face to face counseling and coordinating care.    Consultants: [facility]; Palliative medicine  Code Status: DNR  DVT Prophylaxis: [place] Lovenox   Procedures: As Listed in Progress Note Above  Antibiotics: vanco 4/09>>>4/13 Zosyn 4/09>>>4/13      Subjective: Pt denies cp, sob, n/v/d, abdominal pain, f/c, headache, neck pain, dysuria  Objective: Vitals:   09/29/16 0900 09/29/16 1000 09/29/16 1100 09/29/16 1200  BP: 110/76 124/73 137/68   Pulse: (!) 101 96 96   Resp: (!) 24 (!) 29 (!) 32   Temp:    98.8 F (37.1 C)  TempSrc:      SpO2: 96% 97% 99%   Weight:      Height:        Intake/Output Summary (Last  24 hours) at 09/29/16 1406 Last data filed at 09/29/16 1200  Gross per 24 hour  Intake  3963.67 ml  Output              200 ml  Net          3763.67 ml   Weight change:  Exam:   General:  Pt is alert, follows commands appropriately, not in acute distress  HEENT: No icterus, No thrush, No neck mass, [place]/AT  Cardiovascular: RRR, S1/S2, no rubs, no gallops  Respiratory: bibasilar crackles no wheezing, no crackles, no rhonchi  Abdomen: Soft/+BS, non tender, non distended, no guarding  Extremities: trace LE edema, No lymphangitis, No petechiae, No rashes, no synovitis   Data Reviewed: I have personally reviewed following labs and imaging studies Basic Metabolic Panel:  Recent Labs Lab 10/04/2016 2147 09/26/16 0653 09/27/16 0423 09/28/16 0446 09/29/16 0511  NA 142 142 141 140 140  K 4.1 3.9 3.8 3.7 3.6  CL 110 111 108 108 112*  CO2 25 24 24 23  20*  GLUCOSE 130* 98 80 180* 83  BUN 11 10 12 12 17   CREATININE 0.65 0.57 0.63 0.59 1.29*  CALCIUM 11.3* 10.8* 10.8* 10.4* 9.0   Liver Function Tests:  Recent Labs Lab 09/30/2016 2147 09/28/16 0446 09/29/16 0511  AST 113* 168* 415*  ALT 20 26 61*  ALKPHOS 194* 160* 174*  BILITOT 1.1 1.2 1.6*  PROT 7.5 6.8 7.0  ALBUMIN 2.8* 2.4* 2.5*   No results for input(s): LIPASE, AMYLASE in the last 168 hours.  Recent Labs Lab 09/26/16 1000 09/28/16 1146  AMMONIA 52* 39*   Coagulation Profile:  Recent Labs Lab 09/17/2016 2147  INR 1.13   CBC:  Recent Labs Lab 09/27/2016 2147 09/26/16 0653 09/27/16 0423 09/28/16 0914 09/29/16 0511  WBC 14.8* 13.5* 15.2* 18.3* 22.8*  NEUTROABS 10.7*  --   --  14.7*  --   HGB 9.9* 9.1* 8.8* 9.2* 8.4*  HCT 31.0* 29.4* 27.5* 29.0* 26.9*  MCV 91.2 89.6 91.4 90.6 89.1  PLT 308 264 280 302 278   Cardiac Enzymes:  Recent Labs Lab 09/28/16 0914  CKTOTAL 408*   BNP: Invalid input(s): POCBNP CBG:  Recent Labs Lab 09/28/16 0748 09/29/16 0817  GLUCAP 152* 71    HbA1C:  Recent Labs  09/28/16 0921  HGBA1C 5.1   Urine analysis:    Component Value Date/Time   COLORURINE AMBER (A) 10/10/2016 2145   APPEARANCEUR HAZY (A) 10/15/2016 2145   LABSPEC 1.019 09/26/2016 2145   PHURINE 6.0 10/04/2016 2145   GLUCOSEU NEGATIVE 10/01/2016 2145   HGBUR MODERATE (A) 10/12/2016 2145   [place] NEGATIVE 10/16/2016 2145   [place] NEGATIVE 09/30/2016 2145   PROTEINUR 30 (A) 09/24/2016 2145   NITRITE NEGATIVE 09/27/2016 2145   LEUKOCYTESUR NEGATIVE 10/05/2016 2145   Sepsis Labs: @LABRCNTIP (procalcitonin:4,lacticidven:4) ) Recent Results (from the past 240 hour(s))  Culture, blood (Routine x 2)     Status: None (Preliminary result)   Collection Time: 09/23/2016  9:47 PM  Result Value Ref Range Status   Specimen Description BLOOD LEFT FOREARM  Final   Special Requests   Final    BOTTLES DRAWN AEROBIC AND ANAEROBIC Blood Culture adequate volume   Culture   Final    NO GROWTH 4 DAYS Performed at [facility], [place] [address], [place], [place] [address]    Report Status PENDING  Incomplete  Culture, blood (Routine x 2)     Status: None (Preliminary result)   Collection Time: 09/17/2016  9:47 PM  Result Value Ref Range Status   Specimen Description BLOOD RIGHT ANTECUBITAL  Final   Special Requests   Final    BOTTLES DRAWN AEROBIC AND ANAEROBIC Blood Culture adequate volume   Culture   Final    NO GROWTH 4 DAYS Performed at [facility], [address], [place], [place] [address]    Report Status PENDING  Incomplete  Culture, blood (single)     Status: None (Preliminary result)   Collection Time: 09/26/16  8:30 AM  Result Value Ref Range Status   Specimen Description BLOOD PICC LINE  Final   Special Requests BOTTLES DRAWN AEROBIC AND ANAEROBIC BCLV  Final   Culture   Final    NO GROWTH 3 DAYS Performed at [facility], [place] [address] [place], [place] [address]    Report Status PENDING  Incomplete  Respiratory Panel by PCR      Status: None   Collection Time: 09/27/16  3:08 PM  Result Value Ref Range Status   Adenovirus NOT DETECTED NOT DETECTED Final   Coronavirus 229E NOT DETECTED NOT DETECTED Final   Coronavirus HKU1 NOT DETECTED NOT DETECTED Final   Coronavirus NL63 NOT DETECTED NOT DETECTED Final   Coronavirus OC43 NOT DETECTED NOT DETECTED Final   Metapneumovirus NOT DETECTED NOT DETECTED Final   Rhinovirus / Enterovirus NOT DETECTED NOT DETECTED Final   Influenza A NOT DETECTED NOT DETECTED Final   Influenza B NOT DETECTED NOT DETECTED Final   Parainfluenza Virus 1 NOT DETECTED NOT DETECTED Final   Parainfluenza Virus 2 NOT DETECTED NOT DETECTED Final   Parainfluenza Virus 3 NOT DETECTED NOT DETECTED Final   Parainfluenza Virus 4 NOT DETECTED NOT DETECTED Final   Respiratory Syncytial Virus NOT DETECTED NOT DETECTED Final   Bordetella pertussis NOT DETECTED NOT DETECTED Final   Chlamydophila pneumoniae NOT DETECTED NOT DETECTED Final   Mycoplasma pneumoniae NOT DETECTED NOT DETECTED Final    Comment: Performed at [facility], [place] [address], [place], [place] [address]  Culture, blood (Routine X 2) w Reflex to ID Panel     Status: None (Preliminary result)   Collection Time: 09/28/16  7:09 PM  Result Value Ref Range Status   Specimen Description BLOOD RIGHT ANTECUBITAL  Final   Special Requests   Final    BOTTLES DRAWN AEROBIC ONLY Blood Culture adequate volume   Culture   Final    NO GROWTH < 24 HOURS Performed at [facility], [place] [address] [place], [place] [address]    Report Status PENDING  Incomplete  Culture, blood (Routine X 2) w Reflex to ID Panel     Status: None (Preliminary result)   Collection Time: 09/28/16  7:50 PM  Result Value Ref Range Status   Specimen Description BLOOD LEFT ANTECUBITAL  Final   Special Requests   Final    BOTTLES DRAWN AEROBIC ONLY Blood Culture adequate volume   Culture   Final    NO GROWTH < 24 HOURS Performed at [facility],  [place] [address] [place],  [address]    Report Status PENDING  Incomplete  MRSA PCR Screening     Status: None   Collection Time: 09/28/16  7:50 PM  Result Value Ref Range Status   MRSA by PCR NEGATIVE NEGATIVE Final    Comment:        The GeneXpert MRSA Assay (FDA approved for NASAL specimens only), is one component of a comprehensive MRSA colonization surveillance program. It is not intended to diagnose MRSA infection nor to guide  or monitor treatment for MRSA infections.      Scheduled Meds: . enoxaparin (LOVENOX) injection  40 mg Subcutaneous Q24H  . feeding supplement (ENSURE ENLIVE)  237 mL Oral BID BM  . levalbuterol  1.25 mg Nebulization BID  . levothyroxine  75 mcg Intravenous Daily  . sodium chloride  1,000 mL Intravenous Once   Continuous Infusions: . 0.9 % NaCl with KCl 20 mEq / L 100 mL/hr at 09/29/16 1142  . diltiazem (CARDIZEM) infusion 15 mg/hr (09/29/16 1200)    Procedures/Studies: Dg Chest 2 View  Result Date: 10/02/2016 CLINICAL DATA:  Acute onset of fever and generalized chest pain. Abdominal distention and generalized weakness. Initial encounter. EXAM: CHEST  2 VIEW COMPARISON:  CT of the chest performed 09/04/2016 FINDINGS: The lungs are hypoexpanded. Vascular crowding and vascular congestion are seen. Bilateral pulmonary nodules are again noted throughout the lungs, reflecting known metastatic disease. Underlying airspace opacification cannot be excluded. A small right pleural effusion is again noted. No pneumothorax is seen. The heart is normal in size; the mediastinal contour is within normal limits. A left-sided chest port is noted ending about the mid to distal SVC. No acute osseous abnormalities are seen. IMPRESSION: Lungs hypoexpanded. Vascular congestion noted. Bilateral pulmonary nodules again noted throughout the lungs, reflecting known metastatic disease. Underlying pneumonia cannot be excluded, given the patient's symptoms. Small right pleural  effusion again noted. Electronically Signed   By: Garald Balding M.D.   On: 10/11/2016 22:08   Ct Chest W Contrast  Result Date: 09/26/2016 CLINICAL DATA:  Acute onset of generalized abdominal pain, distention and fever. Leukocytosis. Initial encounter. EXAM: CT CHEST, ABDOMEN, AND PELVIS WITH CONTRAST TECHNIQUE: Multidetector CT imaging of the chest, abdomen and pelvis was performed following the standard protocol during bolus administration of intravenous contrast. CONTRAST:  100 mL of Isovue 300 IV contrast COMPARISON:  CT of abdomen and pelvis performed 09/04/2016 FINDINGS: CT CHEST FINDINGS Cardiovascular: The heart is normal in size. The thoracic aorta is unremarkable. No calcific atherosclerotic disease is seen. The great vessels are grossly unremarkable. Mediastinum/Nodes: An enlarged 1.6 cm subcarinal node is again noted. No additional mediastinal lymphadenopathy is appreciated. No pericardial effusion is identified. The visualized portions of the thyroid gland are unremarkable. No axillary lymphadenopathy is appreciated. A left-sided chest port is noted. Lungs/Pleura: A small right pleural effusion is noted. Diffuse interstitial prominence may reflect mild interstitial edema. There is diffuse enlargement of innumerable pulmonary metastases, measuring up to 2.0 cm in size. No pneumothorax is seen. Musculoskeletal: No acute osseous abnormalities are identified. The visualized musculature is unremarkable in appearance. CT ABDOMEN PELVIS FINDINGS Hepatobiliary: Innumerable masses are noted throughout the liver, reflecting metastatic disease. Stones are suggested at the gallbladder. The gallbladder is decompressed and not well assessed. The common bile duct remains normal in caliber. Small volume ascites is noted within the abdomen and pelvis, increased from the prior study. Pancreas: The pancreas is within normal limits. Spleen: The spleen is unremarkable in appearance. Adrenals/Urinary Tract: The adrenal  glands are unremarkable in appearance. The kidneys are within normal limits Scattered bilateral renal cysts are noted, larger on the right. A nonobstructing 7 mm stone is noted at the interpole region of the right kidney. There is no evidence of hydronephrosis. No obstructing ureteral stones are identified. Nonspecific perinephric stranding is noted bilaterally. Stomach/Bowel: The stomach is unremarkable in appearance. The small bowel is within normal limits. The patient is status post appendectomy. The patient's ileocolic anastomosis is grossly unremarkable. Scattered diverticulosis is noted  along the proximal sigmoid colon, without evidence of diverticulitis. Vascular/Lymphatic: The abdominal aorta is unremarkable in appearance. The inferior vena cava is grossly unremarkable. No retroperitoneal lymphadenopathy is seen. No pelvic sidewall lymphadenopathy is identified. Reproductive: The bladder is mildly distended and grossly unremarkable. The uterus is grossly unremarkable. No suspicious adnexal masses are seen. Other: Small bilateral inguinal hernias are noted, containing only fat. Musculoskeletal: No acute osseous abnormalities are identified. The visualized musculature is unremarkable in appearance. IMPRESSION: 1. Small right pleural effusion again noted. Diffuse interstitial prominence may reflect mild interstitial edema. 2. Interval enlargement of innumerable pulmonary metastases, measuring up to 2.0 cm in size. 3. Innumerable masses again noted throughout the liver, reflecting metastatic disease. 4. Small volume ascites within the abdomen and pelvis is increased from the prior study. 5. Enlarged 1.6 cm subcarinal node noted. 6. Cholelithiasis suggested. 7. Scattered bilateral renal cysts. Nonobstructing 7 mm stone at the interpole region of the right kidney. 8. Scattered diverticulosis along the proximal sigmoid colon, without evidence of diverticulitis. 9. Small bilateral inguinal hernias, containing only  fat. Electronically Signed   By: Garald Balding M.D.   On: 09/26/2016 00:14   Ct Chest W Contrast  Result Date: 09/04/2016 CLINICAL DATA:  Colon cancer diagnosed 5/6 years ago. Ascending colonic primary. Restaging. Diabetes. Hypertension. Anemia. EXAM: CT CHEST, ABDOMEN, AND PELVIS WITH CONTRAST TECHNIQUE: Multidetector CT imaging of the chest, abdomen and pelvis was performed following the standard protocol during bolus administration of intravenous contrast. CONTRAST:  19mL ISOVUE-300 IOPAMIDOL (ISOVUE-300) INJECTION 61% COMPARISON:  07/07/2016 FINDINGS: CT CHEST FINDINGS Cardiovascular: Tortuous thoracic aorta. Normal heart size, without pericardial effusion. No central pulmonary embolism, on this non-dedicated study. Mediastinum/Nodes: No supraclavicular adenopathy. Right paratracheal node is similar at 9 mm on image 18/series 2. No hilar adenopathy. Low periesophageal node is similar at 12 mm on image 37/series 2. Lungs/Pleura: Small right pleural effusion is minimally increased. Trace left pleural fluid is new. Mild motion degradation throughout. Extensive bilateral pulmonary metastasis. Index left upper lobe nodule measures 1.6 cm on image 56/series 6. Compare 1.4 cm on the prior. Index right lower lobe nodule measures 1.4 cm on image 56/series 6. Compare 1.2 cm on the prior. More cephalad left upper lobe index nodule measures 1.3 cm on image 41/series 6 versus 1.1 cm on the prior. No lobar consolidation. Musculoskeletal: No acute osseous abnormality. CT ABDOMEN PELVIS FINDINGS Hepatobiliary: Hepatomegaly at 22.0 cm craniocaudal. Extensive hepatic metastasis. Index high right hepatic lobe lesion measures 2.9 x 2.4 cm on image 40/series 2. Compare 2.7 x 2.1 cm on the prior exam (when remeasured). Calcified lesion just cephalad to the gallbladder is similar at 3.3 cm on image 58/series 2. Anterior left hepatic lobe 1.5 cm lesion image 54/series 2 is felt to be minimally enlarged from 1.4 cm previously.  More central left lateral liver or lesion measures 1.7 cm on image 55/series 2 and is similar. Cholelithiasis without cholecystitis or biliary duct dilatation. Pancreas: Normal, without mass or ductal dilatation. Spleen: Subcapsular subcentimeter low-density splenic lesion is unchanged and of doubtful clinical significance. Adrenals/Urinary Tract: Normal adrenal glands. Right renal cysts. An interpolar right renal lesion measures 1.7 cm on image 78/series 2 and demonstrates complexity. This is unchanged in size to on the prior and similar in size back to 03/05/2012. No hydronephrosis. Normal urinary bladder. Stomach/Bowel: Mild motion degradation continuing into the upper abdomen. Portions of the stomach are collapsed. Colonic stool burden suggests constipation. Partial right hemicolectomy. Normal small bowel. Vascular/Lymphatic: Aortic and branch vessel atherosclerosis. Upper  abdominal adenopathy, including a gastrohepatic ligament node of 1.6 cm on image 52/series 2. This is felt to be similar to the prior exam (when remeasured). No pelvic sidewall adenopathy. Reproductive: Normal uterus and adnexa. Other: Small volume abdominopelvic fluid is unchanged. No specific findings for omental or peritoneal metastasis. Small bilateral fat containing inguinal hernias. Musculoskeletal: No acute osseous abnormality. 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 IMPRESSION: 1. Mild progression of pulmonary metastasis. 2. Similar to minimal progression of hepatic metastasis. 3. Similar lower thoracic and upper abdominal nodal metastasis. 4. Mild motion degradation. 5. Similar small volume abdominopelvic fluid. 6. Increase in small right pleural effusion with new trace left pleural fluid. 7. Cholelithiasis. 8.  Possible constipation. Electronically Signed   By: Abigail Miyamoto M.D.   On: 09/04/2016 19:39   Ct Angio Chest Pe W Or Wo Contrast  Result Date: 09/28/2016 CLINICAL DATA:  Dyspnea EXAM: CT ANGIOGRAPHY  CHEST WITH CONTRAST TECHNIQUE: Multidetector CT imaging of the chest was performed using the standard protocol during bolus administration of intravenous contrast. Multiplanar CT image reconstructions and MIPs were obtained to evaluate the vascular anatomy. CONTRAST:  100 mL Isovue 370 COMPARISON:  Chest CT 10/15/2016 FINDINGS: Cardiovascular: Contrast injection is sufficient to demonstrate satisfactory opacification of the pulmonary arteries to the segmental level. There is no pulmonary embolus. The main pulmonary artery is within normal limits for size. There is no CT evidence of acute right heart strain. The visualized aorta is normal. There is a variant aortic arch branching pattern with an independent origin of the left vertebral artery. Heart size is normal, without pericardial effusion. Mediastinum/Nodes: No mediastinal, hilar or axillary lymphadenopathy. The visualized thyroid and thoracic esophageal course are unremarkable. Lungs/Pleura: Numerous metastatic lesions are again seen scattered throughout both lungs, unchanged from the recent scan of 10/04/2016. There is right lower lobe atelectasis with a small pleural effusion, unchanged. Upper Abdomen: Contrast bolus timing is not optimized for evaluation of the abdominal organs. Numerous lesions throughout the liver, unchanged when allowing for differences in technique. Musculoskeletal: No chest wall abnormality. No acute or significant osseous findings. Review of the MIP images confirms the above findings. IMPRESSION: 1. No pulmonary embolus. 2. Unchanged appearance of innumerable pulmonary and hepatic metastases compared to 10/06/2016. 3. Small right pleural effusion and associated atelectasis, unchanged. Electronically Signed   By: Ulyses Jarred M.D.   On: 09/28/2016 23:20   Ct Abdomen Pelvis W Contrast  Result Date: 09/26/2016 CLINICAL DATA:  Acute onset of generalized abdominal pain, distention and fever. Leukocytosis. Initial encounter. EXAM: CT  CHEST, ABDOMEN, AND PELVIS WITH CONTRAST TECHNIQUE: Multidetector CT imaging of the chest, abdomen and pelvis was performed following the standard protocol during bolus administration of intravenous contrast. CONTRAST:  100 mL of Isovue 300 IV contrast COMPARISON:  CT of abdomen and pelvis performed 09/04/2016 FINDINGS: CT CHEST FINDINGS Cardiovascular: The heart is normal in size. The thoracic aorta is unremarkable. No calcific atherosclerotic disease is seen. The great vessels are grossly unremarkable. Mediastinum/Nodes: An enlarged 1.6 cm subcarinal node is again noted. No additional mediastinal lymphadenopathy is appreciated. No pericardial effusion is identified. The visualized portions of the thyroid gland are unremarkable. No axillary lymphadenopathy is appreciated. A left-sided chest port is noted. Lungs/Pleura: A small right pleural effusion is noted. Diffuse interstitial prominence may reflect mild interstitial edema. There is diffuse enlargement of innumerable pulmonary metastases, measuring up to 2.0 cm in size. No pneumothorax is seen. Musculoskeletal: No acute osseous abnormalities are identified. The visualized musculature is unremarkable in  appearance. CT ABDOMEN PELVIS FINDINGS Hepatobiliary: Innumerable masses are noted throughout the liver, reflecting metastatic disease. Stones are suggested at the gallbladder. The gallbladder is decompressed and not well assessed. The common bile duct remains normal in caliber. Small volume ascites is noted within the abdomen and pelvis, increased from the prior study. Pancreas: The pancreas is within normal limits. Spleen: The spleen is unremarkable in appearance. Adrenals/Urinary Tract: The adrenal glands are unremarkable in appearance. The kidneys are within normal limits Scattered bilateral renal cysts are noted, larger on the right. A nonobstructing 7 mm stone is noted at the interpole region of the right kidney. There is no evidence of hydronephrosis. No  obstructing ureteral stones are identified. Nonspecific perinephric stranding is noted bilaterally. Stomach/Bowel: The stomach is unremarkable in appearance. The small bowel is within normal limits. The patient is status post appendectomy. The patient's ileocolic anastomosis is grossly unremarkable. Scattered diverticulosis is noted along the proximal sigmoid colon, without evidence of diverticulitis. Vascular/Lymphatic: The abdominal aorta is unremarkable in appearance. The inferior vena cava is grossly unremarkable. No retroperitoneal lymphadenopathy is seen. No pelvic sidewall lymphadenopathy is identified. Reproductive: The bladder is mildly distended and grossly unremarkable. The uterus is grossly unremarkable. No suspicious adnexal masses are seen. Other: Small bilateral inguinal hernias are noted, containing only fat. Musculoskeletal: No acute osseous abnormalities are identified. The visualized musculature is unremarkable in appearance. IMPRESSION: 1. Small right pleural effusion again noted. Diffuse interstitial prominence may reflect mild interstitial edema. 2. Interval enlargement of innumerable pulmonary metastases, measuring up to 2.0 cm in size. 3. Innumerable masses again noted throughout the liver, reflecting metastatic disease. 4. Small volume ascites within the abdomen and pelvis is increased from the prior study. 5. Enlarged 1.6 cm subcarinal node noted. 6. Cholelithiasis suggested. 7. Scattered bilateral renal cysts. Nonobstructing 7 mm stone at the interpole region of the right kidney. 8. Scattered diverticulosis along the proximal sigmoid colon, without evidence of diverticulitis. 9. Small bilateral inguinal hernias, containing only fat. Electronically Signed   By: Garald Balding M.D.   On: 09/26/2016 00:14   Ct Abdomen Pelvis W Contrast  Result Date: 09/04/2016 CLINICAL DATA:  Colon cancer diagnosed 5/6 years ago. Ascending colonic primary. Restaging. Diabetes. Hypertension. Anemia. EXAM:  CT CHEST, ABDOMEN, AND PELVIS WITH CONTRAST TECHNIQUE: Multidetector CT imaging of the chest, abdomen and pelvis was performed following the standard protocol during bolus administration of intravenous contrast. CONTRAST:  141mL ISOVUE-300 IOPAMIDOL (ISOVUE-300) INJECTION 61% COMPARISON:  07/07/2016 FINDINGS: CT CHEST FINDINGS Cardiovascular: Tortuous thoracic aorta. Normal heart size, without pericardial effusion. No central pulmonary embolism, on this non-dedicated study. Mediastinum/Nodes: No supraclavicular adenopathy. Right paratracheal node is similar at 9 mm on image 18/series 2. No hilar adenopathy. Low periesophageal node is similar at 12 mm on image 37/series 2. Lungs/Pleura: Small right pleural effusion is minimally increased. Trace left pleural fluid is new. Mild motion degradation throughout. Extensive bilateral pulmonary metastasis. Index left upper lobe nodule measures 1.6 cm on image 56/series 6. Compare 1.4 cm on the prior. Index right lower lobe nodule measures 1.4 cm on image 56/series 6. Compare 1.2 cm on the prior. More cephalad left upper lobe index nodule measures 1.3 cm on image 41/series 6 versus 1.1 cm on the prior. No lobar consolidation. Musculoskeletal: No acute osseous abnormality. CT ABDOMEN PELVIS FINDINGS Hepatobiliary: Hepatomegaly at 22.0 cm craniocaudal. Extensive hepatic metastasis. Index high right hepatic lobe lesion measures 2.9 x 2.4 cm on image 40/series 2. Compare 2.7 x 2.1 cm on the prior exam (when remeasured).  Calcified lesion just cephalad to the gallbladder is similar at 3.3 cm on image 58/series 2. Anterior left hepatic lobe 1.5 cm lesion image 54/series 2 is felt to be minimally enlarged from 1.4 cm previously. More central left lateral liver or lesion measures 1.7 cm on image 55/series 2 and is similar. Cholelithiasis without cholecystitis or biliary duct dilatation. Pancreas: Normal, without mass or ductal dilatation. Spleen: Subcapsular subcentimeter low-density  splenic lesion is unchanged and of doubtful clinical significance. Adrenals/Urinary Tract: Normal adrenal glands. Right renal cysts. An interpolar right renal lesion measures 1.7 cm on image 78/series 2 and demonstrates complexity. This is unchanged in size to on the prior and similar in size back to 03/05/2012. No hydronephrosis. Normal urinary bladder. Stomach/Bowel: Mild motion degradation continuing into the upper abdomen. Portions of the stomach are collapsed. Colonic stool burden suggests constipation. Partial right hemicolectomy. Normal small bowel. Vascular/Lymphatic: Aortic and branch vessel atherosclerosis. Upper abdominal adenopathy, including a gastrohepatic ligament node of 1.6 cm on image 52/series 2. This is felt to be similar to the prior exam (when remeasured). No pelvic sidewall adenopathy. Reproductive: Normal uterus and adnexa. Other: Small volume abdominopelvic fluid is unchanged. No specific findings for omental or peritoneal metastasis. Small bilateral fat containing inguinal hernias. Musculoskeletal: No acute osseous abnormality. 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 IMPRESSION: 1. Mild progression of pulmonary metastasis. 2. Similar to minimal progression of hepatic metastasis. 3. Similar lower thoracic and upper abdominal nodal metastasis. 4. Mild motion degradation. 5. Similar small volume abdominopelvic fluid. 6. Increase in small right pleural effusion with new trace left pleural fluid. 7. Cholelithiasis. 8.  Possible constipation. Electronically Signed   By: Abigail Miyamoto M.D.   On: 09/04/2016 19:39   US Abdomen Limited  Result Date: 09/26/2016 CLINICAL DATA:  History of metastatic colon cancer, request to evaluate for ascites. EXAM: LIMITED ABDOMEN ULTRASOUND FOR ASCITES TECHNIQUE: Limited ultrasound survey for ascites was performed in all four abdominal quadrants. COMPARISON:  None. FINDINGS: No ascitic fluid throughout all 4 quadrants. IMPRESSION: No  ascites.  No paracentesis performed. Read by: Saverio Danker, PA-C Electronically Signed   By: Corrie Mckusick D.O.   On: 09/26/2016 11:26   Dg Abd 2 Views  Result Date: 09/29/2016 CLINICAL DATA:  Acute onset of generalized abdominal pain and distention. Initial encounter. EXAM: ABDOMEN - 2 VIEW COMPARISON:  CT of the abdomen and pelvis performed 10/06/2016 FINDINGS: The visualized bowel gas pattern is unremarkable. Scattered air and stool filled loops of colon are seen; no abnormal dilatation of small bowel loops is seen to suggest small bowel obstruction. No free intra-abdominal air is identified on the provided decubitus view; decreased density along the right abdominal wall likely reflects underlying soft tissue edema. The stomach is partially filled with air. The visualized osseous structures are within normal limits; the sacroiliac joints are unremarkable in appearance. The visualized lung bases are essentially clear. IMPRESSION: Unremarkable bowel gas pattern; no free intra-abdominal air seen. Small to moderate amount of stool noted in the colon. Electronically Signed   By: Garald Balding M.D.   On: 09/29/2016 04:03    Caston Coopersmith, DO  [facility] Pager [phone]  If 7PM-7AM, please contact night-coverage [email or web address] Password [email or web address] 09/29/2016, 2:06 PM   LOS: 3 days

## 2016-09-29 NOTE — Progress Notes (Signed)
In and out cath done to pt not being able to void. Bladder scan showed 368 in bladder, only 100cc of amber, cloudy urine removed. Pt tolerated well.

## 2016-09-29 NOTE — Progress Notes (Signed)
Pharmacy Antibiotic Note  Judith Stephens is a 76 y.o. female admitted on 09/21/2016 with sepsis.  Pharmacy has been consulted for Vancomycin and Zosyn dosing.  Today, 09/29/2016 Day #4 antibiotics  Renal: SCr increased overnight. AKI - on vanco, zosyn and given IV contrast 4/12  CBC: WBC elevated, increased overnight  Vancomycin dose held this am, level drawn at 10:50 = 21 mcg/mL (essentially represents a trough)  Fevers overnight  Plan:  Vancomycin level slightly supratherapeutic.  Concern for worsening renal function and decreased vancomycin clearance.  Will remove vancomycin maintenance dose and recheck renal fx and random level with am labs.  Consider stopping vancomycin or using alternative agent with less nephrotoxicity.   Continue zosyn 3.375gm IV q8h over 4h infusion   Height: 5\' 10"  (177.8 cm) Weight: 173 lb 8 oz (78.7 kg) IBW/kg (Calculated) : 68.5  Temp (24hrs), Avg:99.4 F (37.4 C), Min:97.5 F (36.4 C), Max:102.1 F (38.9 C)   Recent Labs Lab 10/01/2016 2147  09/26/16 0653 09/27/16 0423 09/28/16 0446 09/28/16 0914 09/28/16 1919 09/28/16 2315 09/29/16 0157 09/29/16 0511 09/29/16 1050  WBC 14.8*  --  13.5* 15.2*  --  18.3*  --   --   --  22.8*  --   CREATININE 0.65  --  0.57 0.63 0.59  --   --   --   --  1.29*  --   LATICACIDVEN  --   < > 2.4*  --   --   --  2.6* 2.9* 2.7* 2.7*  --   VANCORANDOM  --   --   --   --   --   --   --   --   --   --  21  < > = values in this interval not displayed.  Estimated Creatinine Clearance: 40.7 mL/min (A) (by C-G formula based on SCr of 1.29 mg/dL (H)).    Allergies  Allergen Reactions  . No Known Allergies Other (See Comments)    Antimicrobials this admission: 4/9 Vanc >>  4/9 Zosyn >>  4/10 Azithromycin x1 4/10 Metronidazole x1 (ordered by admitting for N/V/D ?CDiff)  Dose adjustments this admission: 4/13 1050 VT =  21 mcg/mL on 1gm IV q12h (prior to 8th dose). Order stopped prior to level d/t AKI. Drawn 1h after  would normally draw trough (dose was due at 10a)  Microbiology results: 4/9 BCx x 2: ngtd 4/10 BCx x1: ngtd         Sputum: ordered, not collected        Strep pneumo/Legionella: ordered, not collected  4/10 Flu panel: neg/neg 4/12 resp panel: neg 4/12 BCx:  4/12 MRSA PCR: neg  Thank you for allowing pharmacy to be a part of this patient's care.  Doreene Eland, PharmD, BCPS.   Pager: [phone] 09/29/2016 12:03 PM

## 2016-09-29 NOTE — Progress Notes (Signed)
SLP Cancellation Note  Patient Details Name: Judith Stephens MRN: [id number] DOB: 24-Apr-1941   Cancelled treatment:       Reason Eval/Treat Not Completed: Medical issues which prohibited therapy (RN reports pt currently confused and not tolerating secretions, note palliative referral pending this pm,  pt to possibly transition to comfort?  MD please cancel order if deemed comfort care only.  thanks)   Luanna Salk, [facility] SLP [phone]

## 2016-09-29 NOTE — Progress Notes (Signed)
Date:  September 29, 2016 Chart reviewed for concurrent status and case management needs. Will continue to follow patient progress.  To go home with hospice will touch base with husband to see what equipment is needed and will contact [facility] and palliative care-Stacie Delice Lesch. 1000-spoke to [employer] referral done. Discharge Planning: following for needs Expected discharge date: [id number] Velva Harman, BSN, [place], [place]

## 2016-09-30 LAB — GLUCOSE, CAPILLARY
GLUCOSE-CAPILLARY: 60 mg/dL — AB (ref 65–99)
Glucose-Capillary: 120 mg/dL — ABNORMAL HIGH (ref 65–99)
Glucose-Capillary: 104 mg/dL — ABNORMAL HIGH (ref 65–99)

## 2016-09-30 LAB — COMPREHENSIVE METABOLIC PANEL
ALK PHOS: 126 U/L (ref 38–126)
ALT: 50 U/L (ref 14–54)
AST: 214 U/L — AB (ref 15–41)
Albumin: 2.1 g/dL — ABNORMAL LOW (ref 3.5–5.0)
Anion gap: 7 (ref 5–15)
BILIRUBIN TOTAL: 1.3 mg/dL — AB (ref 0.3–1.2)
BUN: 27 mg/dL — AB (ref 6–20)
CALCIUM: 8.9 mg/dL (ref 8.9–10.3)
CO2: 21 mmol/L — ABNORMAL LOW (ref 22–32)
CREATININE: 2.25 mg/dL — AB (ref 0.44–1.00)
Chloride: 113 mmol/L — ABNORMAL HIGH (ref 101–111)
GFR calc Af Amer: 23 mL/min — ABNORMAL LOW (ref >60)
GFR, EST NON AFRICAN AMERICAN: 20 mL/min — AB (ref >60)
Glucose, Bld: 72 mg/dL (ref 65–99)
POTASSIUM: 5.1 mmol/L (ref 3.5–5.1)
Sodium: 141 mmol/L (ref 135–145)
TOTAL PROTEIN: 6.4 g/dL — AB (ref 6.5–8.1)

## 2016-09-30 LAB — CBC WITH DIFFERENTIAL/PLATELET
BASOS PCT: 0 %
Basophils Absolute: 0 10*3/uL (ref 0.0–0.1)
Eosinophils Absolute: 0 10*3/uL (ref 0.0–0.7)
Eosinophils Relative: 0 %
HEMATOCRIT: 26.7 % — AB (ref 36.0–46.0)
HEMOGLOBIN: 8.1 g/dL — AB (ref 12.0–15.0)
LYMPHS ABS: 2 10*3/uL (ref 0.7–4.0)
Lymphocytes Relative: 7 %
MCH: 28.8 pg (ref 26.0–34.0)
MCHC: 30.3 g/dL (ref 30.0–36.0)
MCV: 95 fL (ref 78.0–100.0)
Monocytes Absolute: 3.2 10*3/uL — ABNORMAL HIGH (ref 0.1–1.0)
Monocytes Relative: 11 %
Neutro Abs: 23.6 10*3/uL — ABNORMAL HIGH (ref 1.7–7.7)
Neutrophils Relative %: 82 %
Platelets: 320 10*3/uL (ref 150–400)
RBC: 2.81 MIL/uL — AB (ref 3.87–5.11)
RDW: 18.1 % — ABNORMAL HIGH (ref 11.5–15.5)
WBC: 28.8 10*3/uL — ABNORMAL HIGH (ref 4.0–10.5)

## 2016-09-30 LAB — URINE CULTURE: CULTURE: NO GROWTH

## 2016-09-30 LAB — CULTURE, BLOOD (ROUTINE X 2)
CULTURE: NO GROWTH
Culture: NO GROWTH
SPECIAL REQUESTS: ADEQUATE
Special Requests: ADEQUATE

## 2016-09-30 LAB — PROCALCITONIN: PROCALCITONIN: 16.42 ng/mL

## 2016-09-30 MED ORDER — DEXTROSE 50 % IV SOLN
1.0000 | Freq: Once | INTRAVENOUS | Status: AC
  Administered 2016-09-30: 50 mL via INTRAVENOUS
  Filled 2016-09-30: qty 50

## 2016-09-30 MED ORDER — ENOXAPARIN SODIUM 30 MG/0.3ML ~~LOC~~ SOLN: 30.0000 mg | SUBCUTANEOUS | Status: DC

## 2016-09-30 MED ORDER — MORPHINE SULFATE (PF) 4 MG/ML IV SOLN
2.0000 mg | INTRAVENOUS | Status: DC | PRN
  Administered 2016-09-30 – 2016-10-02 (×6): 2 mg via INTRAVENOUS
  Filled 2016-09-30 (×6): qty 1

## 2016-09-30 MED ORDER — SODIUM CHLORIDE 0.9 % IV BOLUS (SEPSIS)
1000.0000 mL | Freq: Once | INTRAVENOUS | Status: AC
  Administered 2016-09-30: 1000 mL via INTRAVENOUS

## 2016-09-30 NOTE — Progress Notes (Signed)
         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 The patient was also confused with abdominal pain for 3 days as well. Upon presentation, the patient was noted to have WBC 14.8 with lactic acid 4.0 and temperature 101.3F. Therewas concern for sepsis. The patient was fluid resuscitated and started on intravenous vancomycin and Zosyn.The patient's blood cultures were negative. CT of the abdomen and pelvis did not show any source of infection but revealed diffuse hepatic metastases and pulmonary metastasis. CT angiogram of the chest was negative for pulmonary embolus. Her blood and urine cultures remained negative. The patient's WBC continued to rise. She continued to have intermittent fever despite antibiotics. Subsequently, the patient developed renal failure that was multifactorial. In addition, the patient's LFTs and total bilirubin gradually trended upward with elevated ammonia suggesting hepatic failure. Multiple discussions were held with the husband regarding the patient's poor prognosis. Palliative medicine was consulted and met with the patient's husband. Initially, it was felt the patient may have benefited to transfer to residential hospice. However the patient's medical condition continued to deteriorate. The patient developed SVT. Subsequently, she became hypotensive.The patient received multiple fluid boluses with minimal to no improvement. I had another discussion with the patient's husband regarding the patient's poor prognosis. Initially, the patient's husband wanted to continue to support the patient's blood pressure without boluses. However, the patient remained  hypotensive despite numerous boluses. The patient's husband was updated on the patient's clinical condition. He subsequently felt that out of respect for the patient, that changing the patient's focus of care to the on full comfort was in the patient's best interest.  Assessment/Plan:  SIRS -Patient presented with elevated lactic acid, fever, sinus tachycardia -Continue empiric vancomycin and Zosyn pending culture data -Urinalysis negative for pyuria -Chest x-ray negative for consolidation -CT chest shows diffuse interstitial prominence with innumerable pulmonary metastasis. -CT abdomen and pelvis revealed cholelithiasis with normal CBDand innumerable hepatic metastasis. There were small volume ascites. There was diverticulosis with small bilateral inguinal hernias. -CTA chest--neg PE, RLL atelectasis, small R-effusion -Lactic acid remains elevated due to AKI -viral respiratory panel--neg -blood cultures remain neg -d/c abx--no clear source of infection  -unfortunately, the patient began to develop progressive renal failure and signs of hepatic failure despite full aggressive and optimal therapy -Multiple discussions were held patient's husband. Ultimately, it was felt in the patient's best interest to change the focus of care to the full comfort.  Acute metabolic encephalopathy -Secondary to hypercalcemia, renal failure, hyperammonemia -not much improvement in past 72hours -now pt's focus of care changed to focus on full comfort  Hypercalcemia of malignancy -continuecalcitonin [place] x 4 doses total -pamidronate x 1 dose -am CMP -corrected calcium 11.68-->10.2 -continue IVF  AKI -multifactorial including contrast, antibiotics, volume depletion -continue IVF -continues to worsen -am CMP  Hyperammonemia -a sign of progressive hepatic failure -unclear clinical significance as pt's mental status improving before lactulose -recheck ammonia--52>>>39 with lactulose  SVT -4/12  evening--developed SVT--started diltiazem drim -continue diltiazem drip through today -spontaneously converted to sinus 4/13 -Echo--EF 65-70%, grade 1 DD -TSH--1.094 -CT angio chest--neg PE  Vomiting and diarrhea -Resolved -related to [place] and hypercalcemia -Ultrasound abdomen--no ascites -2 view abd--unremarkable bowel gas pattern  Transaminasemia/hyperbilirubinemia -due   to hepatic mets and suspect impending hepatic failure -continues to worsensuggesting impending hepatic failure -CT abd without signs of cholecystitis  Leukocytosis -d/c all abx and observe clinically -likely due to acute medical condition -cultures remain neg; no clear source of infection -WBC continued to worsen despite antibiotics and negative cultures  Chronic diastolic CHF  -September 2017 echo EF 60-65%, grade 1 DD  Diabetes mellitus type 2 -Check hemoglobin A1c--5.1 -Previously on glipizide as an outpatient -ISS  Hypertension -controlled with diltiazem drip -Patient's focus of care changed to full comfort  Hypothyroidism -Continue Synthroid  Goals of Care -consulted palliative medicine -pt continues to develop complications despite optimal medical therapy -she became hypotensive.The patient received multiple fluid boluses with minimal to no improvement. I had another discussion with the patient's husband regarding the patient's poor prognosis. Initially, the patient's husband wanted to continue to support the patient's blood pressure with fluid boluses. However, the patient remained hypotensive despite numerous boluses. The patient's husband was updated on the patient's clinical condition. He subsequently felt that out of respect for the patient, that changing the patient's focus of care to the on full comfort was in the patient's best interest. -case discussed with Dr. Freeman -Multiple discussions were held patient's husband. Ultimately, it was felt in the patient's best interest to  change the focus of care to the full comfort.      Disposition Plan:   Expecting in-hospital death Family Communication:   Husband updated at bedside--total time 76 minutes  Consultants:  Palliative medicine; med onc--[place]  Code Status:  FULL COMFORT  DVT Prophylaxis: FULL COMFORT   Procedures: As Listed in Progress Note Above  Antibiotics: None    Subjective: Patient is encephalopathic. She occasionally wakes up with tactile stimuli. Does not follow commands. Review of systems unobtainable.  Objective: Vitals:   09/30/16 1715 09/30/16 1730 09/30/16 1745 09/30/16 1800  BP: (!) 65/32 (!) 67/31 (!) 66/34 (!) 63/31  Pulse: 75 74 74 75  Resp: (!) 31 20 (!) 27 (!) 26  Temp:      TempSrc:      SpO2: 91% 91% 92% 92%  Weight:      Height:        Intake/Output Summary (Last 24 hours) at 09/30/16 1807 Last data filed at 09/30/16 0300  Gross per 24 hour  Intake              180 ml  Output              150 ml  Net               30 ml   Weight change:  Exam:   General:  Pt is lethargic, does not follow commands appropriately, not in acute distress  HEENT: No icterus, No thrush, No neck mass, [place]/AT  Cardiovascular: RRR, S1/S2, no rubs, no gallops  Respiratory: Bibasilar crackles. No wheeze  Abdomen: Soft/+BS, non tender, non distended, no guarding  Extremities: 1 + LE edema, No lymphangitis, No petechiae, No rashes, no synovitis   Data Reviewed: I have personally reviewed following labs and imaging studies Basic Metabolic Panel:  Recent Labs Lab 09/26/16 0653 09/27/16 0423 09/28/16 0446 09/29/16 0511 09/30/16 0834  NA 142 141 140 140 141  K 3.9 3.8 3.7 3.6 5.1  CL 111 108 108 112* 113*  CO2 24 24 23 20* 21*  GLUCOSE 98 80 180* 83 72  BUN 10 12 12 17 27*  CREATININE 0.57 0.63 0.59 1.29* 2.25*  CALCIUM 10.8*   10.8* 10.4* 9.0 8.9   Liver Function Tests:  Recent Labs Lab 09/18/2016 2147 09/28/16 0446 09/29/16 0511 09/30/16 0834  AST 113* 168*  415* 214*  ALT 20 26 61* 50  ALKPHOS 194* 160* 174* 126  BILITOT 1.1 1.2 1.6* 1.3*  PROT 7.5 6.8 7.0 6.4*  ALBUMIN 2.8* 2.4* 2.5* 2.1*   No results for input(s): LIPASE, AMYLASE in the last 168 hours.  Recent Labs Lab 09/26/16 1000 09/28/16 1146  AMMONIA 52* 39*   Coagulation Profile:  Recent Labs Lab 09/24/2016 2147  INR 1.13   CBC:  Recent Labs Lab 10/16/2016 2147 09/26/16 0653 09/27/16 0423 09/28/16 0914 09/29/16 0511 09/30/16 0834  WBC 14.8* 13.5* 15.2* 18.3* 22.8* 28.8*  NEUTROABS 10.7*  --   --  14.7*  --  23.6*  HGB 9.9* 9.1* 8.8* 9.2* 8.4* 8.1*  HCT 31.0* 29.4* 27.5* 29.0* 26.9* 26.7*  MCV 91.2 89.6 91.4 90.6 89.1 95.0  PLT 308 264 280 302 278 320   Cardiac Enzymes:  Recent Labs Lab 09/28/16 0914  CKTOTAL 408*   BNP: Invalid input(s): POCBNP CBG:  Recent Labs Lab 09/28/16 0748 09/29/16 0817 09/30/16 0746 09/30/16 0943 09/30/16 1131  GLUCAP 152* 71 60* 120* 104*   HbA1C:  Recent Labs  09/28/16 0921  HGBA1C 5.1   Urine analysis:    Component Value Date/Time   COLORURINE AMBER (A) 10/16/2016 2145   APPEARANCEUR HAZY (A) 09/22/2016 2145   LABSPEC 1.019 10/14/2016 2145   PHURINE 6.0 10/16/2016 2145   GLUCOSEU NEGATIVE 10/02/2016 2145   HGBUR MODERATE (A) 09/24/2016 2145   BILIRUBINUR NEGATIVE 09/20/2016 2145   KETONESUR NEGATIVE 09/30/2016 2145   PROTEINUR 30 (A) 09/23/2016 2145   NITRITE NEGATIVE 10/08/2016 2145   LEUKOCYTESUR NEGATIVE 09/18/2016 2145   Sepsis Labs: @LABRCNTIP(procalcitonin:4,lacticidven:4) ) Recent Results (from the past 240 hour(s))  Culture, blood (Routine x 2)     Status: None   Collection Time: 10/14/2016  9:47 PM  Result Value Ref Range Status   Specimen Description BLOOD LEFT FOREARM  Final   Special Requests   Final    BOTTLES DRAWN AEROBIC AND ANAEROBIC Blood Culture adequate volume   Culture   Final    NO GROWTH 5 DAYS Performed at [facility], [address] [place], [place] [address]     Report Status 09/30/2016 FINAL  Final  Culture, blood (Routine x 2)     Status: None   Collection Time: 10/09/2016  9:47 PM  Result Value Ref Range Status   Specimen Description BLOOD RIGHT ANTECUBITAL  Final   Special Requests   Final    BOTTLES DRAWN AEROBIC AND ANAEROBIC Blood Culture adequate volume   Culture   Final    NO GROWTH 5 DAYS Performed at [facility], [address] [place], [place] [address]    Report Status 09/30/2016 FINAL  Final  Culture, blood (single)     Status: None (Preliminary result)   Collection Time: 09/26/16  8:30 AM  Result Value Ref Range Status   Specimen Description BLOOD PICC LINE  Final   Special Requests BOTTLES DRAWN AEROBIC AND ANAEROBIC BCLV  Final   Culture   Final    NO GROWTH 4 DAYS Performed at  Hospital Lab, [address] [place],  [address]    Report Status PENDING  Incomplete  Respiratory Panel by PCR     Status: None   Collection Time: 09/27/16  3:08 PM  Result Value Ref Range Status     Adenovirus NOT DETECTED NOT DETECTED Final   Coronavirus 229E NOT DETECTED NOT DETECTED Final   Coronavirus HKU1 NOT DETECTED NOT DETECTED Final   Coronavirus NL63 NOT DETECTED NOT DETECTED Final   Coronavirus OC43 NOT DETECTED NOT DETECTED Final   Metapneumovirus NOT DETECTED NOT DETECTED Final   Rhinovirus / Enterovirus NOT DETECTED NOT DETECTED Final   Influenza A NOT DETECTED NOT DETECTED Final   Influenza B NOT DETECTED NOT DETECTED Final   Parainfluenza Virus 1 NOT DETECTED NOT DETECTED Final   Parainfluenza Virus 2 NOT DETECTED NOT DETECTED Final   Parainfluenza Virus 3 NOT DETECTED NOT DETECTED Final   Parainfluenza Virus 4 NOT DETECTED NOT DETECTED Final   Respiratory Syncytial Virus NOT DETECTED NOT DETECTED Final   Bordetella pertussis NOT DETECTED NOT DETECTED Final   Chlamydophila pneumoniae NOT DETECTED NOT DETECTED Final   Mycoplasma pneumoniae NOT DETECTED NOT DETECTED Final    Comment: Performed at [facility], [address] [place], [place] [address]  Culture, blood (Routine X 2) w Reflex to ID Panel     Status: None (Preliminary result)   Collection Time: 09/28/16  7:09 PM  Result Value Ref Range Status   Specimen Description BLOOD RIGHT ANTECUBITAL  Final   Special Requests   Final    BOTTLES DRAWN AEROBIC ONLY Blood Culture adequate volume   Culture   Final    NO GROWTH 2 DAYS Performed at [facility], [address] [place], [place] [address]    Report Status PENDING  Incomplete  Culture, blood (Routine X 2) w Reflex to ID Panel     Status: None (Preliminary result)   Collection Time: 09/28/16  7:50 PM  Result Value Ref Range Status   Specimen Description BLOOD LEFT ANTECUBITAL  Final   Special Requests   Final    BOTTLES DRAWN AEROBIC ONLY Blood Culture adequate volume   Culture   Final    NO GROWTH 2 DAYS Performed at [facility], [address] [place], [place] [address]    Report Status PENDING  Incomplete  MRSA PCR Screening     Status: None   Collection Time: 09/28/16  7:50 PM  Result Value Ref Range Status   MRSA by PCR NEGATIVE NEGATIVE Final    Comment:        The GeneXpert MRSA Assay (FDA approved for NASAL specimens only), is one component of a comprehensive MRSA colonization surveillance program. It is not intended to diagnose MRSA infection nor to guide or monitor treatment for MRSA infections.   Culture, Urine     Status: None   Collection Time: 09/29/16 12:01 PM  Result Value Ref Range Status   Specimen Description URINE, RANDOM  Final   Special Requests NONE  Final   Culture   Final    NO GROWTH Performed at [facility], [address] [place], [place] [address]    Report Status 09/30/2016 FINAL  Final     Scheduled Meds: . sodium chloride  1,000 mL Intravenous Once   Continuous Infusions:  Procedures/Studies: Dg Chest 2 View  Result Date: 09/20/2016 CLINICAL DATA:  Acute onset of fever and generalized chest  pain. Abdominal distention and generalized weakness. Initial encounter. EXAM: CHEST  2 VIEW COMPARISON:  CT of the chest performed 09/04/2016 FINDINGS: The lungs are hypoexpanded. Vascular crowding and vascular congestion are seen. Bilateral pulmonary nodules are again noted throughout the lungs, reflecting known metastatic disease. Underlying airspace opacification   cannot be excluded. A small right pleural effusion is again noted. No pneumothorax is seen. The heart is normal in size; the mediastinal contour is within normal limits. A left-sided chest port is noted ending about the mid to distal SVC. No acute osseous abnormalities are seen. IMPRESSION: Lungs hypoexpanded. Vascular congestion noted. Bilateral pulmonary nodules again noted throughout the lungs, reflecting known metastatic disease. Underlying pneumonia cannot be excluded, given the patient's symptoms. Small right pleural effusion again noted. Electronically Signed   By: Jeffery  Chang M.D.   On: 10/16/2016 22:08   Ct Chest W Contrast  Result Date: 09/26/2016 CLINICAL DATA:  Acute onset of generalized abdominal pain, distention and fever. Leukocytosis. Initial encounter. EXAM: CT CHEST, ABDOMEN, AND PELVIS WITH CONTRAST TECHNIQUE: Multidetector CT imaging of the chest, abdomen and pelvis was performed following the standard protocol during bolus administration of intravenous contrast. CONTRAST:  100 mL of Isovue 300 IV contrast COMPARISON:  CT of abdomen and pelvis performed 09/04/2016 FINDINGS: CT CHEST FINDINGS Cardiovascular: The heart is normal in size. The thoracic aorta is unremarkable. No calcific atherosclerotic disease is seen. The great vessels are grossly unremarkable. Mediastinum/Nodes: An enlarged 1.6 cm subcarinal node is again noted. No additional mediastinal lymphadenopathy is appreciated. No pericardial effusion is identified. The visualized portions of the thyroid gland are unremarkable. No axillary lymphadenopathy is appreciated.  A left-sided chest port is noted. Lungs/Pleura: A small right pleural effusion is noted. Diffuse interstitial prominence may reflect mild interstitial edema. There is diffuse enlargement of innumerable pulmonary metastases, measuring up to 2.0 cm in size. No pneumothorax is seen. Musculoskeletal: No acute osseous abnormalities are identified. The visualized musculature is unremarkable in appearance. CT ABDOMEN PELVIS FINDINGS Hepatobiliary: Innumerable masses are noted throughout the liver, reflecting metastatic disease. Stones are suggested at the gallbladder. The gallbladder is decompressed and not well assessed. The common bile duct remains normal in caliber. Small volume ascites is noted within the abdomen and pelvis, increased from the prior study. Pancreas: The pancreas is within normal limits. Spleen: The spleen is unremarkable in appearance. Adrenals/Urinary Tract: The adrenal glands are unremarkable in appearance. The kidneys are within normal limits Scattered bilateral renal cysts are noted, larger on the right. A nonobstructing 7 mm stone is noted at the interpole region of the right kidney. There is no evidence of hydronephrosis. No obstructing ureteral stones are identified. Nonspecific perinephric stranding is noted bilaterally. Stomach/Bowel: The stomach is unremarkable in appearance. The small bowel is within normal limits. The patient is status post appendectomy. The patient's ileocolic anastomosis is grossly unremarkable. Scattered diverticulosis is noted along the proximal sigmoid colon, without evidence of diverticulitis. Vascular/Lymphatic: The abdominal aorta is unremarkable in appearance. The inferior vena cava is grossly unremarkable. No retroperitoneal lymphadenopathy is seen. No pelvic sidewall lymphadenopathy is identified. Reproductive: The bladder is mildly distended and grossly unremarkable. The uterus is grossly unremarkable. No suspicious adnexal masses are seen. Other: Small  bilateral inguinal hernias are noted, containing only fat. Musculoskeletal: No acute osseous abnormalities are identified. The visualized musculature is unremarkable in appearance. IMPRESSION: 1. Small right pleural effusion again noted. Diffuse interstitial prominence may reflect mild interstitial edema. 2. Interval enlargement of innumerable pulmonary metastases, measuring up to 2.0 cm in size. 3. Innumerable masses again noted throughout the liver, reflecting metastatic disease. 4. Small volume ascites within the abdomen and pelvis is increased from the prior study. 5. Enlarged 1.6 cm subcarinal node noted. 6. Cholelithiasis suggested. 7. Scattered bilateral renal cysts. Nonobstructing 7 mm stone at the interpole region   of the right kidney. 8. Scattered diverticulosis along the proximal sigmoid colon, without evidence of diverticulitis. 9. Small bilateral inguinal hernias, containing only fat. Electronically Signed   By: Jeffery  Chang M.D.   On: 09/26/2016 00:14   Ct Chest W Contrast  Result Date: 09/04/2016 CLINICAL DATA:  Colon cancer diagnosed 5/6 years ago. Ascending colonic primary. Restaging. Diabetes. Hypertension. Anemia. EXAM: CT CHEST, ABDOMEN, AND PELVIS WITH CONTRAST TECHNIQUE: Multidetector CT imaging of the chest, abdomen and pelvis was performed following the standard protocol during bolus administration of intravenous contrast. CONTRAST:  100mL ISOVUE-300 IOPAMIDOL (ISOVUE-300) INJECTION 61% COMPARISON:  07/07/2016 FINDINGS: CT CHEST FINDINGS Cardiovascular: Tortuous thoracic aorta. Normal heart size, without pericardial effusion. No central pulmonary embolism, on this non-dedicated study. Mediastinum/Nodes: No supraclavicular adenopathy. Right paratracheal node is similar at 9 mm on image 18/series 2. No hilar adenopathy. Low periesophageal node is similar at 12 mm on image 37/series 2. Lungs/Pleura: Small right pleural effusion is minimally increased. Trace left pleural fluid is new. Mild  motion degradation throughout. Extensive bilateral pulmonary metastasis. Index left upper lobe nodule measures 1.6 cm on image 56/series 6. Compare 1.4 cm on the prior. Index right lower lobe nodule measures 1.4 cm on image 56/series 6. Compare 1.2 cm on the prior. More cephalad left upper lobe index nodule measures 1.3 cm on image 41/series 6 versus 1.1 cm on the prior. No lobar consolidation. Musculoskeletal: No acute osseous abnormality. CT ABDOMEN PELVIS FINDINGS Hepatobiliary: Hepatomegaly at 22.0 cm craniocaudal. Extensive hepatic metastasis. Index high right hepatic lobe lesion measures 2.9 x 2.4 cm on image 40/series 2. Compare 2.7 x 2.1 cm on the prior exam (when remeasured). Calcified lesion just cephalad to the gallbladder is similar at 3.3 cm on image 58/series 2. Anterior left hepatic lobe 1.5 cm lesion image 54/series 2 is felt to be minimally enlarged from 1.4 cm previously. More central left lateral liver or lesion measures 1.7 cm on image 55/series 2 and is similar. Cholelithiasis without cholecystitis or biliary duct dilatation. Pancreas: Normal, without mass or ductal dilatation. Spleen: Subcapsular subcentimeter low-density splenic lesion is unchanged and of doubtful clinical significance. Adrenals/Urinary Tract: Normal adrenal glands. Right renal cysts. An interpolar right renal lesion measures 1.7 cm on image 78/series 2 and demonstrates complexity. This is unchanged in size to on the prior and similar in size back to 03/05/2012. No hydronephrosis. Normal urinary bladder. Stomach/Bowel: Mild motion degradation continuing into the upper abdomen. Portions of the stomach are collapsed. Colonic stool burden suggests constipation. Partial right hemicolectomy. Normal small bowel. Vascular/Lymphatic: Aortic and branch vessel atherosclerosis. Upper abdominal adenopathy, including a gastrohepatic ligament node of 1.6 cm on image 52/series 2. This is felt to be similar to the prior exam (when  remeasured). No pelvic sidewall adenopathy. Reproductive: Normal uterus and adnexa. Other: Small volume abdominopelvic fluid is unchanged. No specific findings for omental or peritoneal metastasis. Small bilateral fat containing inguinal hernias. Musculoskeletal: No acute osseous abnormality. 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 IMPRESSION: 1. Mild progression of pulmonary metastasis. 2. Similar to minimal progression of hepatic metastasis. 3. Similar lower thoracic and upper abdominal nodal metastasis. 4. Mild motion degradation. 5. Similar small volume abdominopelvic fluid. 6. Increase in small right pleural effusion with new trace left pleural fluid. 7. Cholelithiasis. 8.  Possible constipation. Electronically Signed   By: Kyle  Talbot M.D.   On: 09/04/2016 19:39   Ct Angio Chest Pe W Or Wo Contrast  Result Date: 09/28/2016 CLINICAL DATA:  Dyspnea EXAM: CT ANGIOGRAPHY CHEST   WITH CONTRAST TECHNIQUE: Multidetector CT imaging of the chest was performed using the standard protocol during bolus administration of intravenous contrast. Multiplanar CT image reconstructions and MIPs were obtained to evaluate the vascular anatomy. CONTRAST:  100 mL Isovue 370 COMPARISON:  Chest CT 10/07/2016 FINDINGS: Cardiovascular: Contrast injection is sufficient to demonstrate satisfactory opacification of the pulmonary arteries to the segmental level. There is no pulmonary embolus. The main pulmonary artery is within normal limits for size. There is no CT evidence of acute right heart strain. The visualized aorta is normal. There is a variant aortic arch branching pattern with an independent origin of the left vertebral artery. Heart size is normal, without pericardial effusion. Mediastinum/Nodes: No mediastinal, hilar or axillary lymphadenopathy. The visualized thyroid and thoracic esophageal course are unremarkable. Lungs/Pleura: Numerous metastatic lesions are again seen scattered throughout both  lungs, unchanged from the recent scan of 09/23/2016. There is right lower lobe atelectasis with a small pleural effusion, unchanged. Upper Abdomen: Contrast bolus timing is not optimized for evaluation of the abdominal organs. Numerous lesions throughout the liver, unchanged when allowing for differences in technique. Musculoskeletal: No chest wall abnormality. No acute or significant osseous findings. Review of the MIP images confirms the above findings. IMPRESSION: 1. No pulmonary embolus. 2. Unchanged appearance of innumerable pulmonary and hepatic metastases compared to 09/26/2016. 3. Small right pleural effusion and associated atelectasis, unchanged. Electronically Signed   By: Ulyses Jarred M.D.   On: 09/28/2016 23:20   Ct Abdomen Pelvis W Contrast  Result Date: 09/26/2016 CLINICAL DATA:  Acute onset of generalized abdominal pain, distention and fever. Leukocytosis. Initial encounter. EXAM: CT CHEST, ABDOMEN, AND PELVIS WITH CONTRAST TECHNIQUE: Multidetector CT imaging of the chest, abdomen and pelvis was performed following the standard protocol during bolus administration of intravenous contrast. CONTRAST:  100 mL of Isovue 300 IV contrast COMPARISON:  CT of abdomen and pelvis performed 09/04/2016 FINDINGS: CT CHEST FINDINGS Cardiovascular: The heart is normal in size. The thoracic aorta is unremarkable. No calcific atherosclerotic disease is seen. The great vessels are grossly unremarkable. Mediastinum/Nodes: An enlarged 1.6 cm subcarinal node is again noted. No additional mediastinal lymphadenopathy is appreciated. No pericardial effusion is identified. The visualized portions of the thyroid gland are unremarkable. No axillary lymphadenopathy is appreciated. A left-sided chest port is noted. Lungs/Pleura: A small right pleural effusion is noted. Diffuse interstitial prominence may reflect mild interstitial edema. There is diffuse enlargement of innumerable pulmonary metastases, measuring up to 2.0 cm  in size. No pneumothorax is seen. Musculoskeletal: No acute osseous abnormalities are identified. The visualized musculature is unremarkable in appearance. CT ABDOMEN PELVIS FINDINGS Hepatobiliary: Innumerable masses are noted throughout the liver, reflecting metastatic disease. Stones are suggested at the gallbladder. The gallbladder is decompressed and not well assessed. The common bile duct remains normal in caliber. Small volume ascites is noted within the abdomen and pelvis, increased from the prior study. Pancreas: The pancreas is within normal limits. Spleen: The spleen is unremarkable in appearance. Adrenals/Urinary Tract: The adrenal glands are unremarkable in appearance. The kidneys are within normal limits Scattered bilateral renal cysts are noted, larger on the right. A nonobstructing 7 mm stone is noted at the interpole region of the right kidney. There is no evidence of hydronephrosis. No obstructing ureteral stones are identified. Nonspecific perinephric stranding is noted bilaterally. Stomach/Bowel: The stomach is unremarkable in appearance. The small bowel is within normal limits. The patient is status post appendectomy. The patient's ileocolic anastomosis is grossly unremarkable. Scattered diverticulosis is noted along the  proximal sigmoid colon, without evidence of diverticulitis. Vascular/Lymphatic: The abdominal aorta is unremarkable in appearance. The inferior vena cava is grossly unremarkable. No retroperitoneal lymphadenopathy is seen. No pelvic sidewall lymphadenopathy is identified. Reproductive: The bladder is mildly distended and grossly unremarkable. The uterus is grossly unremarkable. No suspicious adnexal masses are seen. Other: Small bilateral inguinal hernias are noted, containing only fat. Musculoskeletal: No acute osseous abnormalities are identified. The visualized musculature is unremarkable in appearance. IMPRESSION: 1. Small right pleural effusion again noted. Diffuse  interstitial prominence may reflect mild interstitial edema. 2. Interval enlargement of innumerable pulmonary metastases, measuring up to 2.0 cm in size. 3. Innumerable masses again noted throughout the liver, reflecting metastatic disease. 4. Small volume ascites within the abdomen and pelvis is increased from the prior study. 5. Enlarged 1.6 cm subcarinal node noted. 6. Cholelithiasis suggested. 7. Scattered bilateral renal cysts. Nonobstructing 7 mm stone at the interpole region of the right kidney. 8. Scattered diverticulosis along the proximal sigmoid colon, without evidence of diverticulitis. 9. Small bilateral inguinal hernias, containing only fat. Electronically Signed   By: Jeffery  Chang M.D.   On: 09/26/2016 00:14   Ct Abdomen Pelvis W Contrast  Result Date: 09/04/2016 CLINICAL DATA:  Colon cancer diagnosed 5/6 years ago. Ascending colonic primary. Restaging. Diabetes. Hypertension. Anemia. EXAM: CT CHEST, ABDOMEN, AND PELVIS WITH CONTRAST TECHNIQUE: Multidetector CT imaging of the chest, abdomen and pelvis was performed following the standard protocol during bolus administration of intravenous contrast. CONTRAST:  100mL ISOVUE-300 IOPAMIDOL (ISOVUE-300) INJECTION 61% COMPARISON:  07/07/2016 FINDINGS: CT CHEST FINDINGS Cardiovascular: Tortuous thoracic aorta. Normal heart size, without pericardial effusion. No central pulmonary embolism, on this non-dedicated study. Mediastinum/Nodes: No supraclavicular adenopathy. Right paratracheal node is similar at 9 mm on image 18/series 2. No hilar adenopathy. Low periesophageal node is similar at 12 mm on image 37/series 2. Lungs/Pleura: Small right pleural effusion is minimally increased. Trace left pleural fluid is new. Mild motion degradation throughout. Extensive bilateral pulmonary metastasis. Index left upper lobe nodule measures 1.6 cm on image 56/series 6. Compare 1.4 cm on the prior. Index right lower lobe nodule measures 1.4 cm on image 56/series 6.  Compare 1.2 cm on the prior. More cephalad left upper lobe index nodule measures 1.3 cm on image 41/series 6 versus 1.1 cm on the prior. No lobar consolidation. Musculoskeletal: No acute osseous abnormality. CT ABDOMEN PELVIS FINDINGS Hepatobiliary: Hepatomegaly at 22.0 cm craniocaudal. Extensive hepatic metastasis. Index high right hepatic lobe lesion measures 2.9 x 2.4 cm on image 40/series 2. Compare 2.7 x 2.1 cm on the prior exam (when remeasured). Calcified lesion just cephalad to the gallbladder is similar at 3.3 cm on image 58/series 2. Anterior left hepatic lobe 1.5 cm lesion image 54/series 2 is felt to be minimally enlarged from 1.4 cm previously. More central left lateral liver or lesion measures 1.7 cm on image 55/series 2 and is similar. Cholelithiasis without cholecystitis or biliary duct dilatation. Pancreas: Normal, without mass or ductal dilatation. Spleen: Subcapsular subcentimeter low-density splenic lesion is unchanged and of doubtful clinical significance. Adrenals/Urinary Tract: Normal adrenal glands. Right renal cysts. An interpolar right renal lesion measures 1.7 cm on image 78/series 2 and demonstrates complexity. This is unchanged in size to on the prior and similar in size back to 03/05/2012. No hydronephrosis. Normal urinary bladder. Stomach/Bowel: Mild motion degradation continuing into the upper abdomen. Portions of the stomach are collapsed. Colonic stool burden suggests constipation. Partial right hemicolectomy. Normal small bowel. Vascular/Lymphatic: Aortic and branch vessel atherosclerosis. Upper abdominal   adenopathy, including a gastrohepatic ligament node of 1.6 cm on image 52/series 2. This is felt to be similar to the prior exam (when remeasured). No pelvic sidewall adenopathy. Reproductive: Normal uterus and adnexa. Other: Small volume abdominopelvic fluid is unchanged. No specific findings for omental or peritoneal metastasis. Small bilateral fat containing inguinal hernias.  Musculoskeletal: No acute osseous abnormality. 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 IMPRESSION: 1. Mild progression of pulmonary metastasis. 2. Similar to minimal progression of hepatic metastasis. 3. Similar lower thoracic and upper abdominal nodal metastasis. 4. Mild motion degradation. 5. Similar small volume abdominopelvic fluid. 6. Increase in small right pleural effusion with new trace left pleural fluid. 7. Cholelithiasis. 8.  Possible constipation. Electronically Signed   By: Abigail Miyamoto M.D.   On: 09/04/2016 19:39   US Abdomen Limited  Result Date: 09/26/2016 CLINICAL DATA:  History of metastatic colon cancer, request to evaluate for ascites. EXAM: LIMITED ABDOMEN ULTRASOUND FOR ASCITES TECHNIQUE: Limited ultrasound survey for ascites was performed in all four abdominal quadrants. COMPARISON:  None. FINDINGS: No ascitic fluid throughout all 4 quadrants. IMPRESSION: No ascites.  No paracentesis performed. Read by: Saverio Danker, PA-C Electronically Signed   By: Corrie Mckusick D.O.   On: 09/26/2016 11:26   Dg Chest Port 1 View  Result Date: 09/29/2016 CLINICAL DATA:  Dyspnea.  Metastatic colon cancer. EXAM: PORTABLE CHEST 1 VIEW COMPARISON:  10/06/2016 chest radiograph. FINDINGS: Left subclavian MediPort terminates at cavoatrial junction. Low lung volumes. Stable cardiomediastinal silhouette with normal heart size. No pneumothorax. No pleural effusion. Gross is stable pulmonary nodules scattered throughout both lungs. Stable bibasilar hazy lung opacities favoring atelectasis. No acute consolidative airspace disease. IMPRESSION: Stable chest radiograph with low lung volumes, scattered pulmonary metastases throughout both lungs and bibasilar atelectasis. Electronically Signed   By: Ilona Sorrel M.D.   On: 09/29/2016 18:58   Dg Abd 2 Views  Result Date: 09/29/2016 CLINICAL DATA:  Acute onset of generalized abdominal pain and distention. Initial encounter. EXAM: ABDOMEN - 2  VIEW COMPARISON:  CT of the abdomen and pelvis performed 10/16/2016 FINDINGS: The visualized bowel gas pattern is unremarkable. Scattered air and stool filled loops of colon are seen; no abnormal dilatation of small bowel loops is seen to suggest small bowel obstruction. No free intra-abdominal air is identified on the provided decubitus view; decreased density along the right abdominal wall likely reflects underlying soft tissue edema. The stomach is partially filled with air. The visualized osseous structures are within normal limits; the sacroiliac joints are unremarkable in appearance. The visualized lung bases are essentially clear. IMPRESSION: Unremarkable bowel gas pattern; no free intra-abdominal air seen. Small to moderate amount of stool noted in the colon. Electronically Signed   By: Garald Balding M.D.   On: 09/29/2016 04:03    Cherylin Waguespack, DO  [facility] Pager [phone]  If 7PM-7AM, please contact night-coverage [email or web address] Password [email or web address] 09/30/2016, 6:07 PM   LOS: 4 days

## 2016-09-30 NOTE — Progress Notes (Deleted)
Spoke with Dr.Kakrakandy on telephone. Requested for xanax 0.25mg  HS per pts request-this is a medication she is use to taking at home. Also informed MD that patients potassium was 3.2 and pt was unable to take pill form of potassium due to pill being too large-requested another route for drug to be taken. Also informed MD that patient stated she thought she had thrush, this is something she commonly gets from breathing treatments at home, requested order for nystatin mouth wash.  Orders received for one time dose of xanax 0.25mg  PO, 40 meq Liquid potassium PO, and 44ml Nystatin mouth wash TID for 5 days.

## 2016-09-30 NOTE — Discharge Summary (Signed)
Death Summary  Judith Stephens MRN:[id number] DOB: 05/03/1941 DOA: 10/12/2016  PCP: Dean Mitchell, MD  Admit date: 10/05/2016 Date of Death: 10/12/2016 Time of Death: 0115   History of present illness:  76-year-old female with a history of metastatic colon cancer to the liver and lung, hypertension, hyperlipidemia, diet-controlled diabetes mellitus, hypothyroidism presents with 3 day history of nausea, vomiting, diarrhea, cough, and shortness of breath. The patient was also confused with abdominal pain for 3 days as well. Upon presentation, the patient was noted to have WBC 14.8 with lactic acid 4.0 and temperature 101.3F. Therewas concern for sepsis. The patient was fluid resuscitated and started on intravenous vancomycin and Zosyn.The patient's blood cultures were negative. CT of the abdomen and pelvis did not show any source of infection but revealed diffuse hepatic metastases and pulmonary metastasis. CT angiogram of the chest was negative for pulmonary embolus. Her blood and urine cultures remained negative. The patient's WBC continued to rise. She continued to have intermittent fever despite antibiotics. Subsequently, the patient developed renal failure that was multifactorial.  In addition, the patient's LFTs and total bilirubin gradually trended upward with elevated ammonia suggesting hepatic failure. Multiple discussions were held with the husband regarding the patient's poor prognosis. Palliative medicine was consulted and met with the patient's husband. Initially, it was felt the patient may have benefited to transfer to residential hospice. However the patient's medical condition continued to deteriorate. The patient developed SVT. Subsequently, she became hypotensive. The patient received multiple fluid boluses with minimal to no improvement. I had another discussion with the patient's husband regarding the patient's poor prognosis. Initially, the patient's husband wanted to continue to support the  patient's blood pressure without boluses. However, the patient remained hypotensive despite numerous boluses. The patient's husband was updated on the patient's clinical condition. He subsequently felt that out of respect for the patient, that changing the patient's focus of care to the on full comfort was in the patient's best interest.   Final Diagnoses:  SIRS -Patient presented with elevated lactic acid, fever, sinus tachycardia -Continue empiric vancomycin and Zosyn pending culture data -Urinalysis negative for pyuria -Chest x-ray negative for consolidation -CT chest shows diffuse interstitial prominence with innumerable pulmonary metastasis. -CT abdomen and pelvis revealed cholelithiasis with normal CBDand innumerable hepatic metastasis. There were small volume ascites. There was diverticulosis with small bilateral inguinal hernias. -CTA chest--neg PE, RLL atelectasis, small R-effusion -Lactic acid remains elevated due to AKI -viral respiratory panel--neg -blood cultures remain neg -d/c abx--no clear source of infection  -unfortunately, the patient began to develop progressive renal failure and signs of hepatic failure despite full aggressive and optimal therapy -Multiple discussions were held patient's husband. Ultimately, it was felt in the patient's best interest to change the focus of care to the full comfort.  Acute metabolic encephalopathy -Secondary to hypercalcemia, renal failure, hyperammonemia -not much improvement in past 72 hours  Hypercalcemia of malignancy -continuecalcitonin [place] x 4 doses total -pamidronate x 1 dose -am CMP -corrected calcium 11.68-->10.2 -continue IVF  AKI -multifactorial including contrast, antibiotics, volume depletion -continue IVF -continues to worsen -am CMP  Hyperammonemia -a sign of progressive hepatic failure -unclear clinical significance as pt's mental status improving before lactulose -recheck ammonia--52>>>39 with  lactulose  SVT -4/12 evening--developed SVT--started diltiazem drim -continue diltiazem drip through today -spontaneously converted to sinus 4/13 -Echo--EF 65-70%, grade 1 DD -TSH--1.094 -CT angio chest--neg PE  Vomiting and diarrhea -Resolved -related to L[place]urf and hypercalcemia -Ultrasound abdomen--no ascites -2 view abd--unremarkable bowel gas pattern  Transaminasemia/hyperbilirubinemia -  due to hepatic mets and suspect impending hepatic failure -continues to worsen suggesting impending hepatic failure -CT abd without signs of cholecystitis  Leukocytosis -d/c all abx and observe clinically -likely due to acute medical condition -cultures remain neg; no clear source of infection -WBC continued to worsen despite antibiotics and negative cultures  Chronic diastolic CHF  -September 2017 echo EF 60-65%, grade 1 DD  Diabetes mellitus type 2 -Check hemoglobin A1c--5.1 -Previously on glipizide as an outpatient -ISS  Hypertension -controlled with diltiazem drip -Patient's focus of care changed to full comfort  Hypothyroidism -Continue Synthroid  Metastatic Colon Cancer -mets to liver and lungs -followed with Dr. Sherrill  Goals of Care -consulted palliative medicine -pt continues to develop complications despite optimal medical therapy -she became hypotensive. The patient received multiple fluid boluses with minimal to no improvement. I had another discussion with the patient's husband regarding the patient's poor prognosis. Initially, the patient's husband wanted to continue to support the patient's blood pressure with fluid boluses. However, the patient remained hypotensive despite numerous boluses. The patient's husband was updated on the patient's clinical condition. He subsequently felt that out of respect for the patient, that changing the patient's focus of care to the on full comfort was in the patient's best interest. -case discussed with Dr.  Freeman -Multiple discussions were held patient's husband. Ultimately, it was felt in the patient's best interest to change the focus of care to the full comfort.   The results of significant diagnostics from this hospitalization (including imaging, microbiology, ancillary and laboratory) are listed below for reference.    Significant Diagnostic Studies: Dg Chest 2 View  Result Date: 10/14/2016 CLINICAL DATA:  Acute onset of fever and generalized chest pain. Abdominal distention and generalized weakness. Initial encounter. EXAM: CHEST  2 VIEW COMPARISON:  CT of the chest performed 09/04/2016 FINDINGS: The lungs are hypoexpanded. Vascular crowding and vascular congestion are seen. Bilateral pulmonary nodules are again noted throughout the lungs, reflecting known metastatic disease. Underlying airspace opacification cannot be excluded. A small right pleural effusion is again noted. No pneumothorax is seen. The heart is normal in size; the mediastinal contour is within normal limits. A left-sided chest port is noted ending about the mid to distal SVC. No acute osseous abnormalities are seen. IMPRESSION: Lungs hypoexpanded. Vascular congestion noted. Bilateral pulmonary nodules again noted throughout the lungs, reflecting known metastatic disease. Underlying pneumonia cannot be excluded, given the patient's symptoms. Small right pleural effusion again noted. Electronically Signed   By: Jeffery  Chang M.D.   On: 10/02/2016 22:08   Ct Chest W Contrast  Result Date: 09/26/2016 CLINICAL DATA:  Acute onset of generalized abdominal pain, distention and fever. Leukocytosis. Initial encounter. EXAM: CT CHEST, ABDOMEN, AND PELVIS WITH CONTRAST TECHNIQUE: Multidetector CT imaging of the chest, abdomen and pelvis was performed following the standard protocol during bolus administration of intravenous contrast. CONTRAST:  100 mL of Isovue 300 IV contrast COMPARISON:  CT of abdomen and pelvis performed 09/04/2016  FINDINGS: CT CHEST FINDINGS Cardiovascular: The heart is normal in size. The thoracic aorta is unremarkable. No calcific atherosclerotic disease is seen. The great vessels are grossly unremarkable. Mediastinum/Nodes: An enlarged 1.6 cm subcarinal node is again noted. No additional mediastinal lymphadenopathy is appreciated. No pericardial effusion is identified. The visualized portions of the thyroid gland are unremarkable. No axillary lymphadenopathy is appreciated. A left-sided chest port is noted. Lungs/Pleura: A small right pleural effusion is noted. Diffuse interstitial prominence may reflect mild interstitial edema. There is diffuse enlargement of   innumerable pulmonary metastases, measuring up to 2.0 cm in size. No pneumothorax is seen. Musculoskeletal: No acute osseous abnormalities are identified. The visualized musculature is unremarkable in appearance. CT ABDOMEN PELVIS FINDINGS Hepatobiliary: Innumerable masses are noted throughout the liver, reflecting metastatic disease. Stones are suggested at the gallbladder. The gallbladder is decompressed and not well assessed. The common bile duct remains normal in caliber. Small volume ascites is noted within the abdomen and pelvis, increased from the prior study. Pancreas: The pancreas is within normal limits. Spleen: The spleen is unremarkable in appearance. Adrenals/Urinary Tract: The adrenal glands are unremarkable in appearance. The kidneys are within normal limits Scattered bilateral renal cysts are noted, larger on the right. A nonobstructing 7 mm stone is noted at the interpole region of the right kidney. There is no evidence of hydronephrosis. No obstructing ureteral stones are identified. Nonspecific perinephric stranding is noted bilaterally. Stomach/Bowel: The stomach is unremarkable in appearance. The small bowel is within normal limits. The patient is status post appendectomy. The patient's ileocolic anastomosis is grossly unremarkable. Scattered  diverticulosis is noted along the proximal sigmoid colon, without evidence of diverticulitis. Vascular/Lymphatic: The abdominal aorta is unremarkable in appearance. The inferior vena cava is grossly unremarkable. No retroperitoneal lymphadenopathy is seen. No pelvic sidewall lymphadenopathy is identified. Reproductive: The bladder is mildly distended and grossly unremarkable. The uterus is grossly unremarkable. No suspicious adnexal masses are seen. Other: Small bilateral inguinal hernias are noted, containing only fat. Musculoskeletal: No acute osseous abnormalities are identified. The visualized musculature is unremarkable in appearance. IMPRESSION: 1. Small right pleural effusion again noted. Diffuse interstitial prominence may reflect mild interstitial edema. 2. Interval enlargement of innumerable pulmonary metastases, measuring up to 2.0 cm in size. 3. Innumerable masses again noted throughout the liver, reflecting metastatic disease. 4. Small volume ascites within the abdomen and pelvis is increased from the prior study. 5. Enlarged 1.6 cm subcarinal node noted. 6. Cholelithiasis suggested. 7. Scattered bilateral renal cysts. Nonobstructing 7 mm stone at the interpole region of the right kidney. 8. Scattered diverticulosis along the proximal sigmoid colon, without evidence of diverticulitis. 9. Small bilateral inguinal hernias, containing only fat. Electronically Signed   By: Jeffery  Chang M.D.   On: 09/26/2016 00:14   Ct Chest W Contrast  Result Date: 09/04/2016 CLINICAL DATA:  Colon cancer diagnosed 5/6 years ago. Ascending colonic primary. Restaging. Diabetes. Hypertension. Anemia. EXAM: CT CHEST, ABDOMEN, AND PELVIS WITH CONTRAST TECHNIQUE: Multidetector CT imaging of the chest, abdomen and pelvis was performed following the standard protocol during bolus administration of intravenous contrast. CONTRAST:  100mL ISOVUE-300 IOPAMIDOL (ISOVUE-300) INJECTION 61% COMPARISON:  07/07/2016 FINDINGS: CT CHEST  FINDINGS Cardiovascular: Tortuous thoracic aorta. Normal heart size, without pericardial effusion. No central pulmonary embolism, on this non-dedicated study. Mediastinum/Nodes: No supraclavicular adenopathy. Right paratracheal node is similar at 9 mm on image 18/series 2. No hilar adenopathy. Low periesophageal node is similar at 12 mm on image 37/series 2. Lungs/Pleura: Small right pleural effusion is minimally increased. Trace left pleural fluid is new. Mild motion degradation throughout. Extensive bilateral pulmonary metastasis. Index left upper lobe nodule measures 1.6 cm on image 56/series 6. Compare 1.4 cm on the prior. Index right lower lobe nodule measures 1.4 cm on image 56/series 6. Compare 1.2 cm on the prior. More cephalad left upper lobe index nodule measures 1.3 cm on image 41/series 6 versus 1.1 cm on the prior. No lobar consolidation. Musculoskeletal: No acute osseous abnormality. CT ABDOMEN PELVIS FINDINGS Hepatobiliary: Hepatomegaly at 22.0 cm craniocaudal. Extensive hepatic metastasis.   Index high right hepatic lobe lesion measures 2.9 x 2.4 cm on image 40/series 2. Compare 2.7 x 2.1 cm on the prior exam (when remeasured). Calcified lesion just cephalad to the gallbladder is similar at 3.3 cm on image 58/series 2. Anterior left hepatic lobe 1.5 cm lesion image 54/series 2 is felt to be minimally enlarged from 1.4 cm previously. More central left lateral liver or lesion measures 1.7 cm on image 55/series 2 and is similar. Cholelithiasis without cholecystitis or biliary duct dilatation. Pancreas: Normal, without mass or ductal dilatation. Spleen: Subcapsular subcentimeter low-density splenic lesion is unchanged and of doubtful clinical significance. Adrenals/Urinary Tract: Normal adrenal glands. Right renal cysts. An interpolar right renal lesion measures 1.7 cm on image 78/series 2 and demonstrates complexity. This is unchanged in size to on the prior and similar in size back to 03/05/2012. No  hydronephrosis. Normal urinary bladder. Stomach/Bowel: Mild motion degradation continuing into the upper abdomen. Portions of the stomach are collapsed. Colonic stool burden suggests constipation. Partial right hemicolectomy. Normal small bowel. Vascular/Lymphatic: Aortic and branch vessel atherosclerosis. Upper abdominal adenopathy, including a gastrohepatic ligament node of 1.6 cm on image 52/series 2. This is felt to be similar to the prior exam (when remeasured). No pelvic sidewall adenopathy. Reproductive: Normal uterus and adnexa. Other: Small volume abdominopelvic fluid is unchanged. No specific findings for omental or peritoneal metastasis. Small bilateral fat containing inguinal hernias. Musculoskeletal: No acute osseous abnormality. 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 IMPRESSION: 1. Mild progression of pulmonary metastasis. 2. Similar to minimal progression of hepatic metastasis. 3. Similar lower thoracic and upper abdominal nodal metastasis. 4. Mild motion degradation. 5. Similar small volume abdominopelvic fluid. 6. Increase in small right pleural effusion with new trace left pleural fluid. 7. Cholelithiasis. 8.  Possible constipation. Electronically Signed   By: Abigail Miyamoto M.D.   On: 09/04/2016 19:39   Ct Angio Chest Pe W Or Wo Contrast  Result Date: 09/28/2016 CLINICAL DATA:  Dyspnea EXAM: CT ANGIOGRAPHY CHEST WITH CONTRAST TECHNIQUE: Multidetector CT imaging of the chest was performed using the standard protocol during bolus administration of intravenous contrast. Multiplanar CT image reconstructions and MIPs were obtained to evaluate the vascular anatomy. CONTRAST:  100 mL Isovue 370 COMPARISON:  Chest CT 10/12/2016 FINDINGS: Cardiovascular: Contrast injection is sufficient to demonstrate satisfactory opacification of the pulmonary arteries to the segmental level. There is no pulmonary embolus. The main pulmonary artery is within normal limits for size. There is no  CT evidence of acute right heart strain. The visualized aorta is normal. There is a variant aortic arch branching pattern with an independent origin of the left vertebral artery. Heart size is normal, without pericardial effusion. Mediastinum/Nodes: No mediastinal, hilar or axillary lymphadenopathy. The visualized thyroid and thoracic esophageal course are unremarkable. Lungs/Pleura: Numerous metastatic lesions are again seen scattered throughout both lungs, unchanged from the recent scan of 09/23/2016. There is right lower lobe atelectasis with a small pleural effusion, unchanged. Upper Abdomen: Contrast bolus timing is not optimized for evaluation of the abdominal organs. Numerous lesions throughout the liver, unchanged when allowing for differences in technique. Musculoskeletal: No chest wall abnormality. No acute or significant osseous findings. Review of the MIP images confirms the above findings. IMPRESSION: 1. No pulmonary embolus. 2. Unchanged appearance of innumerable pulmonary and hepatic metastases compared to 09/17/2016. 3. Small right pleural effusion and associated atelectasis, unchanged. Electronically Signed   By: Ulyses Jarred M.D.   On: 09/28/2016 23:20   Ct Abdomen Pelvis W Contrast  Result Date: 09/26/2016 CLINICAL DATA:  Acute onset of generalized abdominal pain, distention and fever. Leukocytosis. Initial encounter. EXAM: CT CHEST, ABDOMEN, AND PELVIS WITH CONTRAST TECHNIQUE: Multidetector CT imaging of the chest, abdomen and pelvis was performed following the standard protocol during bolus administration of intravenous contrast. CONTRAST:  100 mL of Isovue 300 IV contrast COMPARISON:  CT of abdomen and pelvis performed 09/04/2016 FINDINGS: CT CHEST FINDINGS Cardiovascular: The heart is normal in size. The thoracic aorta is unremarkable. No calcific atherosclerotic disease is seen. The great vessels are grossly unremarkable. Mediastinum/Nodes: An enlarged 1.6 cm subcarinal node is again  noted. No additional mediastinal lymphadenopathy is appreciated. No pericardial effusion is identified. The visualized portions of the thyroid gland are unremarkable. No axillary lymphadenopathy is appreciated. A left-sided chest port is noted. Lungs/Pleura: A small right pleural effusion is noted. Diffuse interstitial prominence may reflect mild interstitial edema. There is diffuse enlargement of innumerable pulmonary metastases, measuring up to 2.0 cm in size. No pneumothorax is seen. Musculoskeletal: No acute osseous abnormalities are identified. The visualized musculature is unremarkable in appearance. CT ABDOMEN PELVIS FINDINGS Hepatobiliary: Innumerable masses are noted throughout the liver, reflecting metastatic disease. Stones are suggested at the gallbladder. The gallbladder is decompressed and not well assessed. The common bile duct remains normal in caliber. Small volume ascites is noted within the abdomen and pelvis, increased from the prior study. Pancreas: The pancreas is within normal limits. Spleen: The spleen is unremarkable in appearance. Adrenals/Urinary Tract: The adrenal glands are unremarkable in appearance. The kidneys are within normal limits Scattered bilateral renal cysts are noted, larger on the right. A nonobstructing 7 mm stone is noted at the interpole region of the right kidney. There is no evidence of hydronephrosis. No obstructing ureteral stones are identified. Nonspecific perinephric stranding is noted bilaterally. Stomach/Bowel: The stomach is unremarkable in appearance. The small bowel is within normal limits. The patient is status post appendectomy. The patient's ileocolic anastomosis is grossly unremarkable. Scattered diverticulosis is noted along the proximal sigmoid colon, without evidence of diverticulitis. Vascular/Lymphatic: The abdominal aorta is unremarkable in appearance. The inferior vena cava is grossly unremarkable. No retroperitoneal lymphadenopathy is seen. No  pelvic sidewall lymphadenopathy is identified. Reproductive: The bladder is mildly distended and grossly unremarkable. The uterus is grossly unremarkable. No suspicious adnexal masses are seen. Other: Small bilateral inguinal hernias are noted, containing only fat. Musculoskeletal: No acute osseous abnormalities are identified. The visualized musculature is unremarkable in appearance. IMPRESSION: 1. Small right pleural effusion again noted. Diffuse interstitial prominence may reflect mild interstitial edema. 2. Interval enlargement of innumerable pulmonary metastases, measuring up to 2.0 cm in size. 3. Innumerable masses again noted throughout the liver, reflecting metastatic disease. 4. Small volume ascites within the abdomen and pelvis is increased from the prior study. 5. Enlarged 1.6 cm subcarinal node noted. 6. Cholelithiasis suggested. 7. Scattered bilateral renal cysts. Nonobstructing 7 mm stone at the interpole region of the right kidney. 8. Scattered diverticulosis along the proximal sigmoid colon, without evidence of diverticulitis. 9. Small bilateral inguinal hernias, containing only fat. Electronically Signed   By: Garald Balding M.D.   On: 09/26/2016 00:14   Ct Abdomen Pelvis W Contrast  Result Date: 09/04/2016 CLINICAL DATA:  Colon cancer diagnosed 5/6 years ago. Ascending colonic primary. Restaging. Diabetes. Hypertension. Anemia. EXAM: CT CHEST, ABDOMEN, AND PELVIS WITH CONTRAST TECHNIQUE: Multidetector CT imaging of the chest, abdomen and pelvis was performed following the standard protocol during bolus administration of intravenous contrast. CONTRAST:  182m ISOVUE-300 IOPAMIDOL (ISOVUE-300)  INJECTION 61% COMPARISON:  07/07/2016 FINDINGS: CT CHEST FINDINGS Cardiovascular: Tortuous thoracic aorta. Normal heart size, without pericardial effusion. No central pulmonary embolism, on this non-dedicated study. Mediastinum/Nodes: No supraclavicular adenopathy. Right paratracheal node is similar at 9 mm  on image 18/series 2. No hilar adenopathy. Low periesophageal node is similar at 12 mm on image 37/series 2. Lungs/Pleura: Small right pleural effusion is minimally increased. Trace left pleural fluid is new. Mild motion degradation throughout. Extensive bilateral pulmonary metastasis. Index left upper lobe nodule measures 1.6 cm on image 56/series 6. Compare 1.4 cm on the prior. Index right lower lobe nodule measures 1.4 cm on image 56/series 6. Compare 1.2 cm on the prior. More cephalad left upper lobe index nodule measures 1.3 cm on image 41/series 6 versus 1.1 cm on the prior. No lobar consolidation. Musculoskeletal: No acute osseous abnormality. CT ABDOMEN PELVIS FINDINGS Hepatobiliary: Hepatomegaly at 22.0 cm craniocaudal. Extensive hepatic metastasis. Index high right hepatic lobe lesion measures 2.9 x 2.4 cm on image 40/series 2. Compare 2.7 x 2.1 cm on the prior exam (when remeasured). Calcified lesion just cephalad to the gallbladder is similar at 3.3 cm on image 58/series 2. Anterior left hepatic lobe 1.5 cm lesion image 54/series 2 is felt to be minimally enlarged from 1.4 cm previously. More central left lateral liver or lesion measures 1.7 cm on image 55/series 2 and is similar. Cholelithiasis without cholecystitis or biliary duct dilatation. Pancreas: Normal, without mass or ductal dilatation. Spleen: Subcapsular subcentimeter low-density splenic lesion is unchanged and of doubtful clinical significance. Adrenals/Urinary Tract: Normal adrenal glands. Right renal cysts. An interpolar right renal lesion measures 1.7 cm on image 78/series 2 and demonstrates complexity. This is unchanged in size to on the prior and similar in size back to 03/05/2012. No hydronephrosis. Normal urinary bladder. Stomach/Bowel: Mild motion degradation continuing into the upper abdomen. Portions of the stomach are collapsed. Colonic stool burden suggests constipation. Partial right hemicolectomy. Normal small bowel.  Vascular/Lymphatic: Aortic and branch vessel atherosclerosis. Upper abdominal adenopathy, including a gastrohepatic ligament node of 1.6 cm on image 52/series 2. This is felt to be similar to the prior exam (when remeasured). No pelvic sidewall adenopathy. Reproductive: Normal uterus and adnexa. Other: Small volume abdominopelvic fluid is unchanged. No specific findings for omental or peritoneal metastasis. Small bilateral fat containing inguinal hernias. Musculoskeletal: No acute osseous abnormality. 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 IMPRESSION: 1. Mild progression of pulmonary metastasis. 2. Similar to minimal progression of hepatic metastasis. 3. Similar lower thoracic and upper abdominal nodal metastasis. 4. Mild motion degradation. 5. Similar small volume abdominopelvic fluid. 6. Increase in small right pleural effusion with new trace left pleural fluid. 7. Cholelithiasis. 8.  Possible constipation. Electronically Signed   By: Kyle  Talbot M.D.   On: 09/04/2016 19:39   Us Abdomen Limited  Result Date: 09/26/2016 CLINICAL DATA:  History of metastatic colon cancer, request to evaluate for ascites. EXAM: LIMITED ABDOMEN ULTRASOUND FOR ASCITES TECHNIQUE: Limited ultrasound survey for ascites was performed in all four abdominal quadrants. COMPARISON:  None. FINDINGS: No ascitic fluid throughout all 4 quadrants. IMPRESSION: No ascites.  No paracentesis performed. Read by: Kelly Osborne, PA-C Electronically Signed   By: Jaime  Wagner D.O.   On: 09/26/2016 11:26   Dg Chest Port 1 View  Result Date: 09/29/2016 CLINICAL DATA:  Dyspnea.  Metastatic colon cancer. EXAM: PORTABLE CHEST 1 VIEW COMPARISON:  10/02/2016 chest radiograph. FINDINGS: Left subclavian MediPort terminates at cavoatrial junction. Low lung volumes. Stable cardiomediastinal silhouette with   normal heart size. No pneumothorax. No pleural effusion. Gross is stable pulmonary nodules scattered throughout both lungs.  Stable bibasilar hazy lung opacities favoring atelectasis. No acute consolidative airspace disease. IMPRESSION: Stable chest radiograph with low lung volumes, scattered pulmonary metastases throughout both lungs and bibasilar atelectasis. Electronically Signed   By: Jason A Poff M.D.   On: 09/29/2016 18:58   Dg Abd 2 Views  Result Date: 09/29/2016 CLINICAL DATA:  Acute onset of generalized abdominal pain and distention. Initial encounter. EXAM: ABDOMEN - 2 VIEW COMPARISON:  CT of the abdomen and pelvis performed 10/02/2016 FINDINGS: The visualized bowel gas pattern is unremarkable. Scattered air and stool filled loops of colon are seen; no abnormal dilatation of small bowel loops is seen to suggest small bowel obstruction. No free intra-abdominal air is identified on the provided decubitus view; decreased density along the right abdominal wall likely reflects underlying soft tissue edema. The stomach is partially filled with air. The visualized osseous structures are within normal limits; the sacroiliac joints are unremarkable in appearance. The visualized lung bases are essentially clear. IMPRESSION: Unremarkable bowel gas pattern; no free intra-abdominal air seen. Small to moderate amount of stool noted in the colon. Electronically Signed   By: Jeffery  Chang M.D.   On: 09/29/2016 04:03    Microbiology: Recent Results (from the past 240 hour(s))  Culture, blood (Routine x 2)     Status: None   Collection Time: 10/08/2016  9:47 PM  Result Value Ref Range Status   Specimen Description BLOOD LEFT FOREARM  Final   Special Requests   Final    BOTTLES DRAWN AEROBIC AND ANAEROBIC Blood Culture adequate volume   Culture   Final    NO GROWTH 5 DAYS Performed at [facility], [address] [place], [place] [address]    Report Status 09/30/2016 FINAL  Final  Culture, blood (Routine x 2)     Status: None   Collection Time: 10/02/2016  9:47 PM  Result Value Ref Range Status   Specimen Description  BLOOD RIGHT ANTECUBITAL  Final   Special Requests   Final    BOTTLES DRAWN AEROBIC AND ANAEROBIC Blood Culture adequate volume   Culture   Final    NO GROWTH 5 DAYS Performed at [facility], [address] [place], [place] [address]    Report Status 09/30/2016 FINAL  Final  Culture, blood (single)     Status: None (Preliminary result)   Collection Time: 09/26/16  8:30 AM  Result Value Ref Range Status   Specimen Description BLOOD PICC LINE  Final   Special Requests BOTTLES DRAWN AEROBIC AND ANAEROBIC BCLV  Final   Culture   Final    NO GROWTH 4 DAYS Performed at  Hospital Lab, [address] , [place] [address]    Report Status PENDING  Incomplete  Respiratory Panel by PCR     Status: None   Collection Time: 09/27/16  3:08 PM  Result Value Ref Range Status   Adenovirus NOT DETECTED NOT DETECTED Final   Coronavirus 229E NOT DETECTED NOT DETECTED Final   Coronavirus HKU1 NOT DETECTED NOT DETECTED Final   Coronavirus NL63 NOT DETECTED NOT DETECTED Final   Coronavirus OC43 NOT DETECTED NOT DETECTED Final   Metapneumovirus NOT DETECTED NOT DETECTED Final   Rhinovirus / Enterovirus NOT DETECTED NOT DETECTED Final   Influenza A NOT DETECTED NOT DETECTED Final   Influenza B NOT DETECTED NOT DETECTED Final   Parainfluenza Virus 1 NOT   DETECTED NOT DETECTED Final   Parainfluenza Virus 2 NOT DETECTED NOT DETECTED Final   Parainfluenza Virus 3 NOT DETECTED NOT DETECTED Final   Parainfluenza Virus 4 NOT DETECTED NOT DETECTED Final   Respiratory Syncytial Virus NOT DETECTED NOT DETECTED Final   Bordetella pertussis NOT DETECTED NOT DETECTED Final   Chlamydophila pneumoniae NOT DETECTED NOT DETECTED Final   Mycoplasma pneumoniae NOT DETECTED NOT DETECTED Final    Comment: Performed at [facility], [place] [address], [place], [place] [address]  Culture, blood (Routine X 2) w Reflex to ID Panel     Status: None (Preliminary result)   Collection Time: 09/28/16  7:09 PM    Result Value Ref Range Status   Specimen Description BLOOD RIGHT ANTECUBITAL  Final   Special Requests   Final    BOTTLES DRAWN AEROBIC ONLY Blood Culture adequate volume   Culture   Final    NO GROWTH 2 DAYS Performed at [facility], [place] [address] [place], [place] [address]    Report Status PENDING  Incomplete  Culture, blood (Routine X 2) w Reflex to ID Panel     Status: None (Preliminary result)   Collection Time: 09/28/16  7:50 PM  Result Value Ref Range Status   Specimen Description BLOOD LEFT ANTECUBITAL  Final   Special Requests   Final    BOTTLES DRAWN AEROBIC ONLY Blood Culture adequate volume   Culture   Final    NO GROWTH 2 DAYS Performed at [facility], [place] [address], [place], [place] [address]    Report Status PENDING  Incomplete  MRSA PCR Screening     Status: None   Collection Time: 09/28/16  7:50 PM  Result Value Ref Range Status   MRSA by PCR NEGATIVE NEGATIVE Final    Comment:        The GeneXpert MRSA Assay (FDA approved for NASAL specimens only), is one component of a comprehensive MRSA colonization surveillance program. It is not intended to diagnose MRSA infection nor to guide or monitor treatment for MRSA infections.   Culture, Urine     Status: None   Collection Time: 09/29/16 12:01 PM  Result Value Ref Range Status   Specimen Description URINE, RANDOM  Final   Special Requests NONE  Final   Culture   Final    NO GROWTH Performed at [facility], [address] [place],  [place] [address]    Report Status 09/30/2016 FINAL  Final     Labs: Basic Metabolic Panel:  Recent Labs Lab 09/26/16 0653 09/27/16 0423 09/28/16 0446 09/29/16 0511 09/30/16 0834  NA 142 141 140 140 141  K 3.9 3.8 3.7 3.6 5.1  CL 111 108 108 112* 113*  CO2 _0 20* 21*  GLUCOSE 98 80 180* 83 72  BUN _1 27*  CREATININE 0.57 0.63 0.59 1.29* 2.25*  CALCIUM 10.8* 10.8* 10.4* 9.0 8.9   Liver Function Tests:  Recent Labs Lab  10/15/2016 2147 09/28/16 0446 09/29/16 0511 09/30/16 0834  AST 113* 168* 415* 214*  ALT 20 26 61* 50  ALKPHOS 194* 160* 174* 126  BILITOT 1.1 1.2 1.6* 1.3*  PROT 7.5 6.8 7.0 6.4*  ALBUMIN 2.8* 2.4* 2.5* 2.1*   No results for input(s): LIPASE, AMYLASE in the last 168 hours.  Recent Labs Lab 09/26/16 1000 09/28/16 1146  AMMONIA 52* 39*   CBC:  Recent Labs Lab 10/07/2016 2147 09/26/16 0653 09/27/16 0423 09/28/16  9024 09/29/16 0511 09/30/16 0834  WBC 14.8* 13.5* 15.2* 18.3* 22.8* 28.8*  NEUTROABS 10.7*  --   --  14.7*  --  23.6*  HGB 9.9* 9.1* 8.8* 9.2* 8.4* 8.1*  HCT 31.0* 29.4* 27.5* 29.0* 26.9* 26.7*  MCV 91.2 89.6 91.4 90.6 89.1 95.0  PLT 308 264 280 302 278 320   Cardiac Enzymes:  Recent Labs Lab 09/28/16 0914  CKTOTAL 408*   D-Dimer No results for input(s): DDIMER in the last 72 hours. BNP: Invalid input(s): POCBNP CBG:  Recent Labs Lab 09/28/16 0748 09/29/16 0817 09/30/16 0746 09/30/16 0943 09/30/16 1131  GLUCAP 152* 71 60* 120* 104*   Anemia work up No results for input(s): VITAMINB12, FOLATE, FERRITIN, TIBC, IRON, RETICCTPCT in the last 72 hours. Urinalysis    Component Value Date/Time   COLORURINE AMBER (A) 10/16/2016 2145   APPEARANCEUR HAZY (A) 09/27/2016 2145   LABSPEC 1.019 10/11/2016 2145   PHURINE 6.0 10/01/2016 2145   GLUCOSEU NEGATIVE 09/26/2016 2145   HGBUR MODERATE (A) 09/23/2016 2145   BILIRUBINUR NEGATIVE 10/04/2016 2145   [place] NEGATIVE 10/16/2016 2145   PROTEINUR 30 (A) 10/05/2016 2145   NITRITE NEGATIVE 09/29/2016 2145   LEUKOCYTESUR NEGATIVE 09/22/2016 2145   Sepsis Labs Invalid input(s): PROCALCITONIN,  WBC,  LACTICIDVEN Microbiology Recent Results (from the past 240 hour(s))  Culture, blood (Routine x 2)     Status: None   Collection Time: 10/07/2016  9:47 PM  Result Value Ref Range Status   Specimen Description BLOOD LEFT FOREARM  Final   Special Requests   Final    BOTTLES DRAWN AEROBIC AND ANAEROBIC Blood  Culture adequate volume   Culture   Final    NO GROWTH 5 DAYS Performed at [facility], [place] [address] [place], [place] [address]    Report Status 09/30/2016 FINAL  Final  Culture, blood (Routine x 2)     Status: None   Collection Time: 09/30/2016  9:47 PM  Result Value Ref Range Status   Specimen Description BLOOD RIGHT ANTECUBITAL  Final   Special Requests   Final    BOTTLES DRAWN AEROBIC AND ANAEROBIC Blood Culture adequate volume   Culture   Final    NO GROWTH 5 DAYS Performed at [facility], [place] [address], [place], [place] [address]    Report Status 09/30/2016 FINAL  Final  Culture, blood (single)     Status: None (Preliminary result)   Collection Time: 09/26/16  8:30 AM  Result Value Ref Range Status   Specimen Description BLOOD PICC LINE  Final   Special Requests BOTTLES DRAWN AEROBIC AND ANAEROBIC BCLV  Final   Culture   Final    NO GROWTH 4 DAYS Performed at [facility], [place] [address] [place], [place] [address]    Report Status PENDING  Incomplete  Respiratory Panel by PCR     Status: None   Collection Time: 09/27/16  3:08 PM  Result Value Ref Range Status   Adenovirus NOT DETECTED NOT DETECTED Final   Coronavirus 229E NOT DETECTED NOT DETECTED Final   Coronavirus HKU1 NOT DETECTED NOT DETECTED Final   Coronavirus NL63 NOT DETECTED NOT DETECTED Final   Coronavirus OC43 NOT DETECTED NOT DETECTED Final   Metapneumovirus NOT DETECTED NOT DETECTED Final   Rhinovirus / Enterovirus NOT DETECTED NOT DETECTED Final   Influenza A NOT DETECTED NOT DETECTED Final   Influenza B NOT DETECTED NOT DETECTED Final   Parainfluenza Virus 1 NOT DETECTED NOT  DETECTED Final   Parainfluenza Virus 2 NOT DETECTED NOT DETECTED Final   Parainfluenza Virus 3 NOT DETECTED NOT DETECTED Final   Parainfluenza Virus 4 NOT DETECTED NOT DETECTED Final   Respiratory Syncytial Virus NOT DETECTED NOT DETECTED Final   Bordetella pertussis NOT DETECTED NOT DETECTED Final    Chlamydophila pneumoniae NOT DETECTED NOT DETECTED Final   Mycoplasma pneumoniae NOT DETECTED NOT DETECTED Final    Comment: Performed at [facility], [address] [place], [place] [address]  Culture, blood (Routine X 2) w Reflex to ID Panel     Status: None (Preliminary result)   Collection Time: 09/28/16  7:09 PM  Result Value Ref Range Status   Specimen Description BLOOD RIGHT ANTECUBITAL  Final   Special Requests   Final    BOTTLES DRAWN AEROBIC ONLY Blood Culture adequate volume   Culture   Final    NO GROWTH 2 DAYS Performed at [facility], [address] [place], [place] [address]    Report Status PENDING  Incomplete  Culture, blood (Routine X 2) w Reflex to ID Panel     Status: None (Preliminary result)   Collection Time: 09/28/16  7:50 PM  Result Value Ref Range Status   Specimen Description BLOOD LEFT ANTECUBITAL  Final   Special Requests   Final    BOTTLES DRAWN AEROBIC ONLY Blood Culture adequate volume   Culture   Final    NO GROWTH 2 DAYS Performed at  [facility], [address] [place], [place] [address]    Report Status PENDING  Incomplete  MRSA PCR Screening     Status: None   Collection Time: 09/28/16  7:50 PM  Result Value Ref Range Status   MRSA by PCR NEGATIVE NEGATIVE Final    Comment:        The GeneXpert MRSA Assay (FDA approved for NASAL specimens only), is one component of a comprehensive MRSA colonization surveillance program. It is not intended to diagnose MRSA infection nor to guide or monitor treatment for MRSA infections.   Culture, Urine     Status: None   Collection Time: 09/29/16 12:01 PM  Result Value Ref Range Status   Specimen Description URINE, RANDOM  Final   Special Requests NONE  Final   Culture   Final    NO GROWTH Performed at  [facility], [address] [place], [place] [address]    Report Status 09/30/2016 FINAL  Final     Signed:   , DO [facility] ([id number])  [phone] 09/30/2016, 4:56 PM    

## 2016-09-30 NOTE — Progress Notes (Signed)
CRITICAL VALUE ALERT  Critical value received:  CBG 50  Date of notification:  09/30/2016  Time of notification:  2256   Critical value read back:Yes.    Nurse who received alert:  G. Tomara Youngberg  MD notified (1st page):  Dr. Carles Collet  Time of first page:  [phone]  MD notified (2nd page):  none  Time of second page:  none  Responding MD:  Dr. Carles Collet  Time MD responded:  0803  Give 1 amp of D50 per MD

## 2016-09-30 NOTE — Progress Notes (Signed)
[facility] Liaison: RN  Spoke with Dr. Domingo Cocking today.  Patient will not discharge today.  Patient condition has deteriorated overnight.  Will keep monitoring patient condition and discharge plans.   Thanks, Edyth Gunnels, RN, [facility] Liaison [phone]

## 2016-09-30 NOTE — Progress Notes (Signed)
Daily Progress Note   Patient Name: Judith Stephens       Date: 09/30/2016 DOB: 06-17-1941  Age: 76 y.o. MRN#: [id number] Attending Physician: Orson Eva, MD Primary Care Physician: Donnie Coffin, MD Admit Date: 09/26/2016  Reason for Consultation/Follow-up: Establishing goals of care  Subjective: I saw and examined Judith Stephens today.  She appears much worse.  I believe she is actively dying.  See below.  Length of Stay: 4  Current Medications: Scheduled Meds:  . sodium chloride  1,000 mL Intravenous Once   Continuous Infusions:  PRN Meds: acetaminophen **OR** acetaminophen, levalbuterol, morphine injection, sodium chloride flush  Physical Exam   General: Somnolent, in no acute distress.  HEENT: No bruits, no goiter, no JVD Heart: Regular rate. Lungs: Diminished air movement, shallow breaths Abdomen: Soft, nontender, nondistended, positive bowel sounds.  Ext: + edema  Vital Signs: BP (!) 75/36   Pulse 79   Temp 98.1 F (36.7 C) (Axillary)   Resp (!) 33   Ht 5\' 10"  (1.778 m)   Wt 78.7 kg (173 lb 8 oz)   SpO2 93%   BMI 24.20 kg/m  SpO2: SpO2: 93 % O2 Device: O2 Device: Nasal Cannula O2 Flow Rate: O2 Flow Rate (L/min): 2 L/min  Intake/output summary:  Intake/Output Summary (Last 24 hours) at 09/30/16 1605 Last data filed at 09/30/16 0300  Gross per 24 hour  Intake              180 ml  Output              150 ml  Net               30 ml   LBM: Last BM Date: 09/28/16 Baseline Weight: Weight: 78.7 kg (173 lb 8 oz) Most recent weight: Weight: 78.7 kg (173 lb 8 oz)       Palliative Assessment/Data:    Flowsheet Rows     Most Recent Value  Intake Tab  Referral Department  Hospitalist  Unit at Time of Referral  Intermediate Care Unit  Palliative Care Primary Diagnosis   Cancer  Date Notified  09/29/16  Palliative Care Type  New Palliative care  Reason for referral  Clarify Goals of Care, Counsel Regarding Hospice  Date of Admission  09/23/2016  Date first seen by Palliative Care  09/29/16  # of days Palliative referral response time  0 Day(s)  # of days IP prior to Palliative referral  4  Clinical Assessment  Palliative Performance Scale Score  10%  Pain Max last 24 hours  Not able to report  Pain Min Last 24 hours  Not able to report  Psychosocial & Spiritual Assessment  Palliative Care Outcomes  Patient/Family meeting held?  Yes  Who was at the meeting?  husband      Patient Active Problem List   Diagnosis Date Noted  . AKI (acute kidney injury) ([place]) 09/29/2016  . Hyperammonemia ([place]) 09/28/2016  . Metastatic colon cancer to liver ([place]) 09/27/2016  . Abdominal pain 09/26/2016  . Nausea, vomiting and diarrhea 09/26/2016  . Cough 09/26/2016  . Acute encephalopathy   . Healthcare-associated pneumonia 04/14/2016  . Pneumonia 04/13/2016  . Sepsis ([place]) 04/13/2016  . Hypercalcemia 04/13/2016  . Hypothyroidism 04/13/2016  . Severe protein-calorie malnutrition ([place]) 03/17/2016  . Syncope 03/16/2016  . Port catheter in place 03/14/2016  . Liver metastasis ([place]) 01/13/2016  . Lung metastasis ([place]) 01/13/2016  . Sepsis, unspecified organism ([place]) 01/09/2016  . Incisional hernia 03/08/2012  . Normocytic anemia 03/06/2012  . Colon cancer ([place]) 03/06/2012  . Diabetes mellitus type II, non insulin dependent ([place]) 03/06/2012  . HTN (hypertension) 03/06/2012    Palliative Care Assessment & Plan   Patient Profile: 76 y.o. female  with past medical history of Metastatic colon cancer (liver and lung), hypertension, hyperlipidemia, diabetes, hypothyroidism admitted on 09/28/2016 with nausea, vomiting, diarrhea, shortness of breath.  Her condition has continued to worsen. Palliative consulted for goals of care.   Recommendations/Plan:  Judith Stephens  appears to me that she is actively dying.  I called and shared this with her husband.  We discussed her clinical course and likely pathways forward.  He would like [place] to continue to support her BP with bolus IVF.  He is coming to the hospital.  Code Status:    Code Status Orders        Start     Ordered   09/26/16 0149  Do not attempt resuscitation (DNR)  Continuous    Question Answer Comment  In the event of cardiac or respiratory ARREST Do not call a "code blue"   In the event of cardiac or respiratory ARREST Do not perform Intubation, CPR, defibrillation or ACLS   In the event of cardiac or respiratory ARREST Use medication by any route, position, wound care, and other measures to relive pain and suffering. May use oxygen, suction and manual treatment of airway obstruction as needed for comfort.      09/26/16 0149    Code Status History    Date Active Date Inactive Code Status Order ID Comments User Context   04/14/2016  9:47 AM 04/14/2016  6:29 PM DNR [id number]  Murlean Iba, MD Inpatient   04/13/2016  4:18 AM 04/14/2016  9:47 AM Full Code [id number]  Norval Morton, MD Inpatient   03/16/2016  9:08 PM 03/19/2016  4:56 PM Full Code [id number]  Vianne Bulls, MD ED   01/09/2016 10:06 PM 01/13/2016  6:19 PM Full Code [id number]  Jani Gravel, MD ED   03/06/2012  6:12 AM 03/07/2012 12:25 AM Full Code [id number]  Etta Quill, DO ED       Prognosis:   Hours - Days  Discharge Planning:  Anticipated Hospital Death- Based on condition this AM, I doubt she will be stable enough to transition from  the hospital.  Will assess again tomorrow if she survives the night.  Care plan was discussed with RN, [facility] Liaison  Thank you for allowing the Palliative Medicine Team to assist in the care of this patient.   Time In: 1100 Time Out: 1130 Total Time 30 Prolonged Time Billed No      Greater than 50%  of this time was spent counseling and coordinating care related to the above assessment  and plan.  Micheline Rough, MD  Please contact Palliative Medicine Team phone at [phone] for questions and concerns.

## 2016-10-01 LAB — CULTURE, BLOOD (SINGLE): CULTURE: NO GROWTH

## 2016-10-01 MED ORDER — LORAZEPAM 2 MG/ML IJ SOLN
0.5000 mg | INTRAMUSCULAR | Status: DC | PRN
  Administered 2016-10-01: 0.5 mg via INTRAVENOUS
  Filled 2016-10-01: qty 1

## 2016-10-01 MED ORDER — GLYCOPYRROLATE 0.2 MG/ML IJ SOLN
0.2000 mg | INTRAMUSCULAR | Status: DC | PRN
  Administered 2016-10-02 (×2): 0.2 mg via INTRAVENOUS
  Filled 2016-10-01 (×4): qty 1

## 2016-10-01 MED ORDER — HALOPERIDOL LACTATE 5 MG/ML IJ SOLN: 1.0000 mg | INTRAMUSCULAR | Status: DC | PRN

## 2016-10-01 NOTE — Progress Notes (Signed)
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 The patient was also confused with abdominal pain for 3 days as well. Upon presentation, the patient was noted to have WBC 14.8 with lactic acid 4.0 and temperature 101.104F. Therewas concern for sepsis. The patient was fluid resuscitated and started on intravenous vancomycin and Zosyn.The patient's blood cultures were negative. CT of the abdomen and pelvis did not show any source of infection but revealed diffuse hepatic metastases and pulmonary metastasis. CT angiogram of the chest was negative for pulmonary embolus. Her blood and urine cultures remained negative. The patient's WBC continued to rise. She continued to have intermittent fever despite antibiotics. Subsequently, the patient developed renal failure that was multifactorial. In addition, the patient's LFTs and total bilirubin gradually trended upward with elevated ammonia suggesting hepatic failure. Multiple discussions were held with the husband regarding the patient's poor prognosis. Palliative medicine was consulted and met with the patient's husband. Initially, it was felt the patient may have benefited to transfer to residential hospice. However the patient's medical condition continued to deteriorate. The patient developed SVT. Subsequently, she became hypotensive.The patient received multiple fluid boluses with minimal to no improvement. I had another discussion with the patient's husband regarding the patient's poor prognosis. Initially, the patient's husband wanted to continue to support the patient's blood pressure without boluses. However, the patient remained  hypotensive despite numerous boluses. The patient's husband was updated on the patient's clinical condition. He subsequently felt that out of respect for the patient, that changing the patient's focus of care to the on full comfort was in the patient's best interest.  Assessment/Plan:  SIRS -Patient presented with elevated lactic acid, fever, sinus tachycardia -Continue empiric vancomycin and Zosyn pending culture data -Urinalysis negative for pyuria -Chest x-ray negative for consolidation -CT chest shows diffuse interstitial prominence with innumerable pulmonary metastasis. -CT abdomen and pelvis revealed cholelithiasis with normal CBDand innumerable hepatic metastasis. There were small volume ascites. There was diverticulosis with small bilateral inguinal hernias. -CTA chest--neg PE, RLL atelectasis, small R-effusion -Lactic acid remains elevated due to AKI -viral respiratory panel--neg -blood cultures remain neg -d/c abx--no clear source of infection  -unfortunately, the patient began to develop progressive renal failure and signs of hepatic failure despite full aggressive and optimal therapy -Multiple discussions were held patient's husband. Ultimately, it was felt in the patient's best interest to change the focus of care to the full comfort.  Acute metabolic encephalopathy -Secondary to hypercalcemia, renal failure, hyperammonemia -not much improvement in past 72hours -now pt's focus of care changed to focus on full comfort  Hypercalcemia of malignancy -continuecalcitonin [place] x 4 doses total -pamidronate x 1 dose -am CMP -corrected calcium 11.68-->10.2 -continue IVF  AKI -multifactorial including contrast, antibiotics, volume depletion -continue IVF -continues to worsen -am CMP  Hyperammonemia -a sign of progressive hepatic failure -unclear clinical significance as pt's mental status improving before lactulose -recheck ammonia--52>>>39 with  lactulose  SVT -4/12 evening--developed SVT--started diltiazem drim -continue diltiazem drip through today -spontaneously converted to sinus 4/13 -Echo--EF 65-70%, grade 1 DD -TSH--1.094 -CT angio chest--neg PE  Vomiting and diarrhea -Resolved -related to [place] and hypercalcemia -Ultrasound abdomen--no ascites -2 view abd--unremarkable bowel gas pattern  Transaminasemia/hyperbilirubinemia -due  to hepatic mets and suspect impending hepatic failure -continues to worsensuggesting impending hepatic failure -CT abd without signs of cholecystitis  Leukocytosis -d/c all abx and observe clinically -likely due to acute medical condition -cultures remain neg; no clear source of infection -WBC continued to worsen despite antibiotics and negative cultures  Chronic diastolic CHF  -September 6237 echo EF 60-65%, grade 1 DD  Diabetes mellitus type 2 -Check hemoglobin A1c--5.1 -Previously on glipizide as an outpatient -ISS  Hypertension -controlled with diltiazem drip -Patient's focus of care changed to full comfort  Hypothyroidism -Continue Synthroid  Goals of Care -consulted palliative medicine -pt continues to develop complications despite optimal medical therapy -she became hypotensive.The patient received multiple fluid boluses with minimal to no improvement. I had another discussion with the patient's husband regarding the patient's poor prognosis. Initially, the patient's husband wanted to continue to support the patient's blood pressure with fluid boluses. However, the patient remained hypotensive despite numerous boluses. The patient's husband was updated on the patient's clinical condition. He subsequently felt that out of respect for the patient, that changing the patient's focus of care to the on full comfort was in the patient's best interest. -case discussed with Dr. Domingo Cocking -Multiple discussions were held patient's husband. Ultimately, it was felt in the  patient's best interest to change the focus of care to the full comfort.    Disposition Plan:   Expecting in-hospital death Family Communication:   Husband updated at bedsid  Consultants:  Palliative medicine; med onc--[place]  Code Status:  FULL COMFORT  DVT Prophylaxis: FULL COMFORT   Procedures: As Listed in Progress Note Above  Antibiotics: None     Subjective: Pt is comfortable; unresponsive.  Has some tachypnea without distress. No vomiting, distress, diarrhea or uncontrolled pain  Objective: Vitals:   10/01/16 1400 10/01/16 1500 10/01/16 1600 10/01/16 1623  BP: (!) 75/36 (!) 77/34 (!) 75/32   Pulse: 81 85 85 86  Resp: (!) 34 (!) 34 (!) 29 (!) 32  Temp:      TempSrc:      SpO2: (!) 83% (!) 85% (!) 85% (!) 84%  Weight:      Height:        Intake/Output Summary (Last 24 hours) at 10/01/16 1754 Last data filed at 10/01/16 1200  Gross per 24 hour  Intake                0 ml  Output                0 ml  Net                0 ml   Weight change:  Exam:   General: not in acute distress  HEENT: No icterus, soft  Cardiovascular: RRR, S1/S2, no rubs, no gallops  Respiratory: bilateral rales, no wheeze  Abdomen: Soft/+BS,  non distended, no guarding  Extremities: 1 + LE edema, No lymphangitis, No petechiae, No rashes, no synovitis   Data Reviewed: I have personally reviewed following labs and imaging studies Basic Metabolic Panel:  Recent Labs Lab 09/26/16 0653 09/27/16 0423 09/28/16 0446 09/29/16 0511 09/30/16 0834  NA 142 141 140 140 141  K 3.9 3.8 3.7 3.6 5.1  CL 111 108 108 112* 113*  CO2 '24 24 23 '$ 20* 21*  GLUCOSE 98 80 180* 83 72  BUN '10 12 12 17 '$ 27*  CREATININE 0.57 0.63 0.59 1.29* 2.25*  CALCIUM 10.8* 10.8* 10.4* 9.0 8.9   Liver Function Tests:  Recent  Labs Lab 10/15/2016 2147 09/28/16 0446 09/29/16 0511 09/30/16 0834  AST 113* 168* 415* 214*  ALT 20 26 61* 50  ALKPHOS 194* 160* 174* 126  BILITOT 1.1 1.2 1.6* 1.3*   PROT 7.5 6.8 7.0 6.4*  ALBUMIN 2.8* 2.4* 2.5* 2.1*   No results for input(s): LIPASE, AMYLASE in the last 168 hours.  Recent Labs Lab 09/26/16 1000 09/28/16 1146  AMMONIA 52* 39*   Coagulation Profile:  Recent Labs Lab 09/21/2016 2147  INR 1.13   CBC:  Recent Labs Lab 09/23/2016 2147 09/26/16 0653 09/27/16 0423 09/28/16 0914 09/29/16 0511 09/30/16 0834  WBC 14.8* 13.5* 15.2* 18.3* 22.8* 28.8*  NEUTROABS 10.7*  --   --  14.7*  --  23.6*  HGB 9.9* 9.1* 8.8* 9.2* 8.4* 8.1*  HCT 31.0* 29.4* 27.5* 29.0* 26.9* 26.7*  MCV 91.2 89.6 91.4 90.6 89.1 95.0  PLT 308 264 280 302 278 320   Cardiac Enzymes:  Recent Labs Lab 09/28/16 0914  CKTOTAL 408*   BNP: Invalid input(s): POCBNP CBG:  Recent Labs Lab 09/28/16 0748 09/29/16 0817 09/30/16 0746 09/30/16 0943 09/30/16 1131  GLUCAP 152* 71 60* 120* 104*   HbA1C: No results for input(s): HGBA1C in the last 72 hours. Urine analysis:    Component Value Date/Time   COLORURINE AMBER (A) 09/23/2016 2145   APPEARANCEUR HAZY (A) 10/02/2016 2145   LABSPEC 1.019 09/24/2016 2145   PHURINE 6.0 10/07/2016 2145   GLUCOSEU NEGATIVE 10/05/2016 2145   HGBUR MODERATE (A) 10/12/2016 2145   BILIRUBINUR NEGATIVE 09/22/2016 2145   [place] NEGATIVE 10/01/2016 2145   PROTEINUR 30 (A) 09/27/2016 2145   NITRITE NEGATIVE 09/28/2016 2145   LEUKOCYTESUR NEGATIVE 10/08/2016 2145   Sepsis Labs: '@LABRCNTIP'$ (procalcitonin:4,lacticidven:4) ) Recent Results (from the past 240 hour(s))  Culture, blood (Routine x 2)     Status: None   Collection Time: 10/16/2016  9:47 PM  Result Value Ref Range Status   Specimen Description BLOOD LEFT FOREARM  Final   Special Requests   Final    BOTTLES DRAWN AEROBIC AND ANAEROBIC Blood Culture adequate volume   Culture   Final    NO GROWTH 5 DAYS Performed at [facility], [place] [address], [place], [place] [address]    Report Status 09/30/2016 FINAL  Final  Culture, blood (Routine x 2)     Status:  None   Collection Time: 10/07/2016  9:47 PM  Result Value Ref Range Status   Specimen Description BLOOD RIGHT ANTECUBITAL  Final   Special Requests   Final    BOTTLES DRAWN AEROBIC AND ANAEROBIC Blood Culture adequate volume   Culture   Final    NO GROWTH 5 DAYS Performed at [facility], [place] [address], [place], [place] [address]    Report Status 09/30/2016 FINAL  Final  Culture, blood (single)     Status: None   Collection Time: 09/26/16  8:30 AM  Result Value Ref Range Status   Specimen Description BLOOD PICC LINE  Final   Special Requests BOTTLES DRAWN AEROBIC AND ANAEROBIC BCLV  Final   Culture   Final    NO GROWTH 5 DAYS Performed at [facility], [place] [address], [place],  [id number]    Report Status 10/01/2016 FINAL  Final  Respiratory Panel by PCR     Status: None   Collection Time: 09/27/16  3:08 PM  Result Value Ref Range Status   Adenovirus NOT DETECTED NOT DETECTED Final   Coronavirus 229E NOT  DETECTED NOT DETECTED Final   Coronavirus HKU1 NOT DETECTED NOT DETECTED Final   Coronavirus NL63 NOT DETECTED NOT DETECTED Final   Coronavirus OC43 NOT DETECTED NOT DETECTED Final   Metapneumovirus NOT DETECTED NOT DETECTED Final   Rhinovirus / Enterovirus NOT DETECTED NOT DETECTED Final   Influenza A NOT DETECTED NOT DETECTED Final   Influenza B NOT DETECTED NOT DETECTED Final   Parainfluenza Virus 1 NOT DETECTED NOT DETECTED Final   Parainfluenza Virus 2 NOT DETECTED NOT DETECTED Final   Parainfluenza Virus 3 NOT DETECTED NOT DETECTED Final   Parainfluenza Virus 4 NOT DETECTED NOT DETECTED Final   Respiratory Syncytial Virus NOT DETECTED NOT DETECTED Final   Bordetella pertussis NOT DETECTED NOT DETECTED Final   Chlamydophila pneumoniae NOT DETECTED NOT DETECTED Final   Mycoplasma pneumoniae NOT DETECTED NOT DETECTED Final    Comment: Performed at [facility], [place] [address], [place], [place] [address]  Culture, blood (Routine X 2) w Reflex to ID  Panel     Status: None (Preliminary result)   Collection Time: 09/28/16  7:09 PM  Result Value Ref Range Status   Specimen Description BLOOD RIGHT ANTECUBITAL  Final   Special Requests   Final    BOTTLES DRAWN AEROBIC ONLY Blood Culture adequate volume   Culture   Final    NO GROWTH 3 DAYS Performed at [facility], [place] [address] [place], [place] [address]    Report Status PENDING  Incomplete  Culture, blood (Routine X 2) w Reflex to ID Panel     Status: None (Preliminary result)   Collection Time: 09/28/16  7:50 PM  Result Value Ref Range Status   Specimen Description BLOOD LEFT ANTECUBITAL  Final   Special Requests   Final    BOTTLES DRAWN AEROBIC ONLY Blood Culture adequate volume   Culture   Final    NO GROWTH 3 DAYS Performed at [facility], [place] [address] [place], [place] [address]    Report Status PENDING  Incomplete  MRSA PCR Screening     Status: None   Collection Time: 09/28/16  7:50 PM  Result Value Ref Range Status   MRSA by PCR NEGATIVE NEGATIVE Final    Comment:        The GeneXpert MRSA Assay (FDA approved for NASAL specimens only), is one component of a comprehensive MRSA colonization surveillance program. It is not intended to diagnose MRSA infection nor to guide or monitor treatment for MRSA infections.   Culture, Urine     Status: None   Collection Time: 09/29/16 12:01 PM  Result Value Ref Range Status   Specimen Description URINE, RANDOM  Final   Special Requests NONE  Final   Culture   Final    NO GROWTH Performed at [facility], [address] [place], [place] [address]    Report Status 09/30/2016 FINAL  Final     Scheduled Meds: . sodium chloride  1,000 mL Intravenous Once   Continuous Infusions:  Procedures/Studies: Dg Chest 2 View  Result Date: 09/26/2016 CLINICAL DATA:  Acute onset of fever and generalized chest pain. Abdominal distention and generalized weakness. Initial encounter. EXAM: CHEST  2 VIEW  COMPARISON:  CT of the chest performed 09/04/2016 FINDINGS: The lungs are hypoexpanded. Vascular crowding and vascular congestion are seen. Bilateral pulmonary nodules are again noted throughout the lungs, reflecting known metastatic disease. Underlying airspace opacification cannot be excluded. A small right pleural effusion is again noted.  No pneumothorax is seen. The heart is normal in size; the mediastinal contour is within normal limits. A left-sided chest port is noted ending about the mid to distal SVC. No acute osseous abnormalities are seen. IMPRESSION: Lungs hypoexpanded. Vascular congestion noted. Bilateral pulmonary nodules again noted throughout the lungs, reflecting known metastatic disease. Underlying pneumonia cannot be excluded, given the patient's symptoms. Small right pleural effusion again noted. Electronically Signed   By: Garald Balding M.D.   On: 10/06/2016 22:08   Ct Chest W Contrast  Result Date: 09/26/2016 CLINICAL DATA:  Acute onset of generalized abdominal pain, distention and fever. Leukocytosis. Initial encounter. EXAM: CT CHEST, ABDOMEN, AND PELVIS WITH CONTRAST TECHNIQUE: Multidetector CT imaging of the chest, abdomen and pelvis was performed following the standard protocol during bolus administration of intravenous contrast. CONTRAST:  100 mL of Isovue 300 IV contrast COMPARISON:  CT of abdomen and pelvis performed 09/04/2016 FINDINGS: CT CHEST FINDINGS Cardiovascular: The heart is normal in size. The thoracic aorta is unremarkable. No calcific atherosclerotic disease is seen. The great vessels are grossly unremarkable. Mediastinum/Nodes: An enlarged 1.6 cm subcarinal node is again noted. No additional mediastinal lymphadenopathy is appreciated. No pericardial effusion is identified. The visualized portions of the thyroid gland are unremarkable. No axillary lymphadenopathy is appreciated. A left-sided chest port is noted. Lungs/Pleura: A small right pleural effusion is noted.  Diffuse interstitial prominence may reflect mild interstitial edema. There is diffuse enlargement of innumerable pulmonary metastases, measuring up to 2.0 cm in size. No pneumothorax is seen. Musculoskeletal: No acute osseous abnormalities are identified. The visualized musculature is unremarkable in appearance. CT ABDOMEN PELVIS FINDINGS Hepatobiliary: Innumerable masses are noted throughout the liver, reflecting metastatic disease. Stones are suggested at the gallbladder. The gallbladder is decompressed and not well assessed. The common bile duct remains normal in caliber. Small volume ascites is noted within the abdomen and pelvis, increased from the prior study. Pancreas: The pancreas is within normal limits. Spleen: The spleen is unremarkable in appearance. Adrenals/Urinary Tract: The adrenal glands are unremarkable in appearance. The kidneys are within normal limits Scattered bilateral renal cysts are noted, larger on the right. A nonobstructing 7 mm stone is noted at the interpole region of the right kidney. There is no evidence of hydronephrosis. No obstructing ureteral stones are identified. Nonspecific perinephric stranding is noted bilaterally. Stomach/Bowel: The stomach is unremarkable in appearance. The small bowel is within normal limits. The patient is status post appendectomy. The patient's ileocolic anastomosis is grossly unremarkable. Scattered diverticulosis is noted along the proximal sigmoid colon, without evidence of diverticulitis. Vascular/Lymphatic: The abdominal aorta is unremarkable in appearance. The inferior vena cava is grossly unremarkable. No retroperitoneal lymphadenopathy is seen. No pelvic sidewall lymphadenopathy is identified. Reproductive: The bladder is mildly distended and grossly unremarkable. The uterus is grossly unremarkable. No suspicious adnexal masses are seen. Other: Small bilateral inguinal hernias are noted, containing only fat. Musculoskeletal: No acute osseous  abnormalities are identified. The visualized musculature is unremarkable in appearance. IMPRESSION: 1. Small right pleural effusion again noted. Diffuse interstitial prominence may reflect mild interstitial edema. 2. Interval enlargement of innumerable pulmonary metastases, measuring up to 2.0 cm in size. 3. Innumerable masses again noted throughout the liver, reflecting metastatic disease. 4. Small volume ascites within the abdomen and pelvis is increased from the prior study. 5. Enlarged 1.6 cm subcarinal node noted. 6. Cholelithiasis suggested. 7. Scattered bilateral renal cysts. Nonobstructing 7 mm stone at the interpole region of the right kidney. 8. Scattered diverticulosis along the proximal sigmoid  colon, without evidence of diverticulitis. 9. Small bilateral inguinal hernias, containing only fat. Electronically Signed   By: Roanna Raider M.D.   On: 09/26/2016 00:14   Ct Chest W Contrast  Result Date: 09/04/2016 CLINICAL DATA:  Colon cancer diagnosed 5/6 years ago. Ascending colonic primary. Restaging. Diabetes. Hypertension. Anemia. EXAM: CT CHEST, ABDOMEN, AND PELVIS WITH CONTRAST TECHNIQUE: Multidetector CT imaging of the chest, abdomen and pelvis was performed following the standard protocol during bolus administration of intravenous contrast. CONTRAST:  ISOVUE-300 IOPAMIDOL (ISOVUE-300) INJECTION 61% COMPARISON:  07/07/2016 FINDINGS: CT CHEST FINDINGS Cardiovascular: Tortuous thoracic aorta. Normal heart size, without pericardial effusion. No central pulmonary embolism, on this non-dedicated study. Mediastinum/Nodes: No supraclavicular adenopathy. Right paratracheal node is similar at 9 mm on image 18/series 2. No hilar adenopathy. Low periesophageal node is similar at 12 mm on image 37/series 2. Lungs/Pleura: Small right pleural effusion is minimally increased. Trace left pleural fluid is new. Mild motion degradation throughout. Extensive bilateral pulmonary metastasis. Index left upper lobe  nodule measures 1.6 cm on image 56/series 6. Compare 1.4 cm on the prior. Index right lower lobe nodule measures 1.4 cm on image 56/series 6. Compare 1.2 cm on the prior. More cephalad left upper lobe index nodule measures 1.3 cm on image 41/series 6 versus 1.1 cm on the prior. No lobar consolidation. Musculoskeletal: No acute osseous abnormality. CT ABDOMEN PELVIS FINDINGS Hepatobiliary: Hepatomegaly at 22.0 cm craniocaudal. Extensive hepatic metastasis. Index high right hepatic lobe lesion measures 2.9 x 2.4 cm on image 40/series 2. Compare 2.7 x 2.1 cm on the prior exam (when remeasured). Calcified lesion just cephalad to the gallbladder is similar at 3.3 cm on image 58/series 2. Anterior left hepatic lobe 1.5 cm lesion image 54/series 2 is felt to be minimally enlarged from 1.4 cm previously. More central left lateral liver or lesion measures 1.7 cm on image 55/series 2 and is similar. Cholelithiasis without cholecystitis or biliary duct dilatation. Pancreas: Normal, without mass or ductal dilatation. Spleen: Subcapsular subcentimeter low-density splenic lesion is unchanged and of doubtful clinical significance. Adrenals/Urinary Tract: Normal adrenal glands. Right renal cysts. An interpolar right renal lesion measures 1.7 cm on image 78/series 2 and demonstrates complexity. This is unchanged in size to on the prior and similar in size back to 03/05/2012. No hydronephrosis. Normal urinary bladder. Stomach/Bowel: Mild motion degradation continuing into the upper abdomen. Portions of the stomach are collapsed. Colonic stool burden suggests constipation. Partial right hemicolectomy. Normal small bowel. Vascular/Lymphatic: Aortic and branch vessel atherosclerosis. Upper abdominal adenopathy, including a gastrohepatic ligament node of 1.6 cm on image 52/series 2. This is felt to be similar to the prior exam (when remeasured). No pelvic sidewall adenopathy. Reproductive: Normal uterus and adnexa. Other: Small volume  abdominopelvic fluid is unchanged. No specific findings for omental or peritoneal metastasis. Small bilateral fat containing inguinal hernias. Musculoskeletal: No acute osseous abnormality. 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 IMPRESSION: 1. Mild progression of pulmonary metastasis. 2. Similar to minimal progression of hepatic metastasis. 3. Similar lower thoracic and upper abdominal nodal metastasis. 4. Mild motion degradation. 5. Similar small volume abdominopelvic fluid. 6. Increase in small right pleural effusion with new trace left pleural fluid. 7. Cholelithiasis. 8.  Possible constipation. Electronically Signed   By: Jeronimo Greaves M.D.   On: 09/04/2016 19:39   Ct Angio Chest Pe W Or Wo Contrast  Result Date: 09/28/2016 CLINICAL DATA:  Dyspnea EXAM: CT ANGIOGRAPHY CHEST WITH CONTRAST TECHNIQUE: Multidetector CT imaging of the chest was performed  using the standard protocol during bolus administration of intravenous contrast. Multiplanar CT image reconstructions and MIPs were obtained to evaluate the vascular anatomy. CONTRAST:  100 mL Isovue 370 COMPARISON:  Chest CT 10/14/2016 FINDINGS: Cardiovascular: Contrast injection is sufficient to demonstrate satisfactory opacification of the pulmonary arteries to the segmental level. There is no pulmonary embolus. The main pulmonary artery is within normal limits for size. There is no CT evidence of acute right heart strain. The visualized aorta is normal. There is a variant aortic arch branching pattern with an independent origin of the left vertebral artery. Heart size is normal, without pericardial effusion. Mediastinum/Nodes: No mediastinal, hilar or axillary lymphadenopathy. The visualized thyroid and thoracic esophageal course are unremarkable. Lungs/Pleura: Numerous metastatic lesions are again seen scattered throughout both lungs, unchanged from the recent scan of 10/02/2016. There is right lower lobe atelectasis with a small  pleural effusion, unchanged. Upper Abdomen: Contrast bolus timing is not optimized for evaluation of the abdominal organs. Numerous lesions throughout the liver, unchanged when allowing for differences in technique. Musculoskeletal: No chest wall abnormality. No acute or significant osseous findings. Review of the MIP images confirms the above findings. IMPRESSION: 1. No pulmonary embolus. 2. Unchanged appearance of innumerable pulmonary and hepatic metastases compared to 09/22/2016. 3. Small right pleural effusion and associated atelectasis, unchanged. Electronically Signed   By: Ulyses Jarred M.D.   On: 09/28/2016 23:20   Ct Abdomen Pelvis W Contrast  Result Date: 09/26/2016 CLINICAL DATA:  Acute onset of generalized abdominal pain, distention and fever. Leukocytosis. Initial encounter. EXAM: CT CHEST, ABDOMEN, AND PELVIS WITH CONTRAST TECHNIQUE: Multidetector CT imaging of the chest, abdomen and pelvis was performed following the standard protocol during bolus administration of intravenous contrast. CONTRAST:  100 mL of Isovue 300 IV contrast COMPARISON:  CT of abdomen and pelvis performed 09/04/2016 FINDINGS: CT CHEST FINDINGS Cardiovascular: The heart is normal in size. The thoracic aorta is unremarkable. No calcific atherosclerotic disease is seen. The great vessels are grossly unremarkable. Mediastinum/Nodes: An enlarged 1.6 cm subcarinal node is again noted. No additional mediastinal lymphadenopathy is appreciated. No pericardial effusion is identified. The visualized portions of the thyroid gland are unremarkable. No axillary lymphadenopathy is appreciated. A left-sided chest port is noted. Lungs/Pleura: A small right pleural effusion is noted. Diffuse interstitial prominence may reflect mild interstitial edema. There is diffuse enlargement of innumerable pulmonary metastases, measuring up to 2.0 cm in size. No pneumothorax is seen. Musculoskeletal: No acute osseous abnormalities are identified. The  visualized musculature is unremarkable in appearance. CT ABDOMEN PELVIS FINDINGS Hepatobiliary: Innumerable masses are noted throughout the liver, reflecting metastatic disease. Stones are suggested at the gallbladder. The gallbladder is decompressed and not well assessed. The common bile duct remains normal in caliber. Small volume ascites is noted within the abdomen and pelvis, increased from the prior study. Pancreas: The pancreas is within normal limits. Spleen: The spleen is unremarkable in appearance. Adrenals/Urinary Tract: The adrenal glands are unremarkable in appearance. The kidneys are within normal limits Scattered bilateral renal cysts are noted, larger on the right. A nonobstructing 7 mm stone is noted at the interpole region of the right kidney. There is no evidence of hydronephrosis. No obstructing ureteral stones are identified. Nonspecific perinephric stranding is noted bilaterally. Stomach/Bowel: The stomach is unremarkable in appearance. The small bowel is within normal limits. The patient is status post appendectomy. The patient's ileocolic anastomosis is grossly unremarkable. Scattered diverticulosis is noted along the proximal sigmoid colon, without evidence of diverticulitis. Vascular/Lymphatic: The abdominal aorta  is unremarkable in appearance. The inferior vena cava is grossly unremarkable. No retroperitoneal lymphadenopathy is seen. No pelvic sidewall lymphadenopathy is identified. Reproductive: The bladder is mildly distended and grossly unremarkable. The uterus is grossly unremarkable. No suspicious adnexal masses are seen. Other: Small bilateral inguinal hernias are noted, containing only fat. Musculoskeletal: No acute osseous abnormalities are identified. The visualized musculature is unremarkable in appearance. IMPRESSION: 1. Small right pleural effusion again noted. Diffuse interstitial prominence may reflect mild interstitial edema. 2. Interval enlargement of innumerable pulmonary  metastases, measuring up to 2.0 cm in size. 3. Innumerable masses again noted throughout the liver, reflecting metastatic disease. 4. Small volume ascites within the abdomen and pelvis is increased from the prior study. 5. Enlarged 1.6 cm subcarinal node noted. 6. Cholelithiasis suggested. 7. Scattered bilateral renal cysts. Nonobstructing 7 mm stone at the interpole region of the right kidney. 8. Scattered diverticulosis along the proximal sigmoid colon, without evidence of diverticulitis. 9. Small bilateral inguinal hernias, containing only fat. Electronically Signed   By: Garald Balding M.D.   On: 09/26/2016 00:14   Ct Abdomen Pelvis W Contrast  Result Date: 09/04/2016 CLINICAL DATA:  Colon cancer diagnosed 5/6 years ago. Ascending colonic primary. Restaging. Diabetes. Hypertension. Anemia. EXAM: CT CHEST, ABDOMEN, AND PELVIS WITH CONTRAST TECHNIQUE: Multidetector CT imaging of the chest, abdomen and pelvis was performed following the standard protocol during bolus administration of intravenous contrast. CONTRAST:  182m ISOVUE-300 IOPAMIDOL (ISOVUE-300) INJECTION 61% COMPARISON:  07/07/2016 FINDINGS: CT CHEST FINDINGS Cardiovascular: Tortuous thoracic aorta. Normal heart size, without pericardial effusion. No central pulmonary embolism, on this non-dedicated study. Mediastinum/Nodes: No supraclavicular adenopathy. Right paratracheal node is similar at 9 mm on image 18/series 2. No hilar adenopathy. Low periesophageal node is similar at 12 mm on image 37/series 2. Lungs/Pleura: Small right pleural effusion is minimally increased. Trace left pleural fluid is new. Mild motion degradation throughout. Extensive bilateral pulmonary metastasis. Index left upper lobe nodule measures 1.6 cm on image 56/series 6. Compare 1.4 cm on the prior. Index right lower lobe nodule measures 1.4 cm on image 56/series 6. Compare 1.2 cm on the prior. More cephalad left upper lobe index nodule measures 1.3 cm on image 41/series 6  versus 1.1 cm on the prior. No lobar consolidation. Musculoskeletal: No acute osseous abnormality. CT ABDOMEN PELVIS FINDINGS Hepatobiliary: Hepatomegaly at 22.0 cm craniocaudal. Extensive hepatic metastasis. Index high right hepatic lobe lesion measures 2.9 x 2.4 cm on image 40/series 2. Compare 2.7 x 2.1 cm on the prior exam (when remeasured). Calcified lesion just cephalad to the gallbladder is similar at 3.3 cm on image 58/series 2. Anterior left hepatic lobe 1.5 cm lesion image 54/series 2 is felt to be minimally enlarged from 1.4 cm previously. More central left lateral liver or lesion measures 1.7 cm on image 55/series 2 and is similar. Cholelithiasis without cholecystitis or biliary duct dilatation. Pancreas: Normal, without mass or ductal dilatation. Spleen: Subcapsular subcentimeter low-density splenic lesion is unchanged and of doubtful clinical significance. Adrenals/Urinary Tract: Normal adrenal glands. Right renal cysts. An interpolar right renal lesion measures 1.7 cm on image 78/series 2 and demonstrates complexity. This is unchanged in size to on the prior and similar in size back to 03/05/2012. No hydronephrosis. Normal urinary bladder. Stomach/Bowel: Mild motion degradation continuing into the upper abdomen. Portions of the stomach are collapsed. Colonic stool burden suggests constipation. Partial right hemicolectomy. Normal small bowel. Vascular/Lymphatic: Aortic and branch vessel atherosclerosis. Upper abdominal adenopathy, including a gastrohepatic ligament node of 1.6 cm on image  52/series 2. This is felt to be similar to the prior exam (when remeasured). No pelvic sidewall adenopathy. Reproductive: Normal uterus and adnexa. Other: Small volume abdominopelvic fluid is unchanged. No specific findings for omental or peritoneal metastasis. Small bilateral fat containing inguinal hernias. Musculoskeletal: No acute osseous abnormality. 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  IMPRESSION: 1. Mild progression of pulmonary metastasis. 2. Similar to minimal progression of hepatic metastasis. 3. Similar lower thoracic and upper abdominal nodal metastasis. 4. Mild motion degradation. 5. Similar small volume abdominopelvic fluid. 6. Increase in small right pleural effusion with new trace left pleural fluid. 7. Cholelithiasis. 8.  Possible constipation. Electronically Signed   By: Abigail Miyamoto M.D.   On: 09/04/2016 19:39   US Abdomen Limited  Result Date: 09/26/2016 CLINICAL DATA:  History of metastatic colon cancer, request to evaluate for ascites. EXAM: LIMITED ABDOMEN ULTRASOUND FOR ASCITES TECHNIQUE: Limited ultrasound survey for ascites was performed in all four abdominal quadrants. COMPARISON:  None. FINDINGS: No ascitic fluid throughout all 4 quadrants. IMPRESSION: No ascites.  No paracentesis performed. Read by: Saverio Danker, PA-C Electronically Signed   By: Corrie Mckusick D.O.   On: 09/26/2016 11:26   Dg Chest Port 1 View  Result Date: 09/29/2016 CLINICAL DATA:  Dyspnea.  Metastatic colon cancer. EXAM: PORTABLE CHEST 1 VIEW COMPARISON:  10/14/2016 chest radiograph. FINDINGS: Left subclavian MediPort terminates at cavoatrial junction. Low lung volumes. Stable cardiomediastinal silhouette with normal heart size. No pneumothorax. No pleural effusion. Gross is stable pulmonary nodules scattered throughout both lungs. Stable bibasilar hazy lung opacities favoring atelectasis. No acute consolidative airspace disease. IMPRESSION: Stable chest radiograph with low lung volumes, scattered pulmonary metastases throughout both lungs and bibasilar atelectasis. Electronically Signed   By: Ilona Sorrel M.D.   On: 09/29/2016 18:58   Dg Abd 2 Views  Result Date: 09/29/2016 CLINICAL DATA:  Acute onset of generalized abdominal pain and distention. Initial encounter. EXAM: ABDOMEN - 2 VIEW COMPARISON:  CT of the abdomen and pelvis performed 09/18/2016 FINDINGS: The visualized bowel gas pattern  is unremarkable. Scattered air and stool filled loops of colon are seen; no abnormal dilatation of small bowel loops is seen to suggest small bowel obstruction. No free intra-abdominal air is identified on the provided decubitus view; decreased density along the right abdominal wall likely reflects underlying soft tissue edema. The stomach is partially filled with air. The visualized osseous structures are within normal limits; the sacroiliac joints are unremarkable in appearance. The visualized lung bases are essentially clear. IMPRESSION: Unremarkable bowel gas pattern; no free intra-abdominal air seen. Small to moderate amount of stool noted in the colon. Electronically Signed   By: Garald Balding M.D.   On: 09/29/2016 04:03    Vrinda Heckstall, DO  [facility] Pager [phone]  If 7PM-7AM, please contact night-coverage [email or web address] Password [email or web address] 10/01/2016, 5:54 PM   LOS: 5 days

## 2016-10-01 NOTE — Progress Notes (Signed)
Patient arrived to unit. Received. Pt is Dry. Bed is locked. Vitals performed prior to transfer.

## 2016-10-01 NOTE — Progress Notes (Signed)
Daily Progress Note   Patient Name: Judith Stephens       Date: 10/01/2016 DOB: 1940/12/29  Age: 76 y.o. MRN#: [id number] Attending Physician: Orson Eva, MD Primary Care Physician: Donnie Coffin, MD Admit Date: 09/26/2016  Reason for Consultation/Follow-up: Establishing goals of care  Subjective: I saw and examined Judith Stephens today.  She is actively dying. See below.  Length of Stay: 5  Current Medications: Scheduled Meds:  . sodium chloride  1,000 mL Intravenous Once   Continuous Infusions:  PRN Meds: acetaminophen **OR** acetaminophen, glycopyrrolate, haloperidol lactate, levalbuterol, LORazepam, morphine injection, sodium chloride flush  Physical Exam   General: Somnolent, in no acute distress.  HEENT: No bruits, no goiter, no JVD Heart: Regular rate. Lungs: Diminished air movement, shallow breaths, tachypnic Abdomen: Soft, nontender, nondistended, positive bowel sounds.  Ext: + edema  Vital Signs: BP (!) 77/33   Pulse 80   Temp 97.9 F (36.6 C) (Axillary)   Resp (!) 34   Ht 5\' 10"  (1.778 m)   Wt 78.7 kg (173 lb 8 oz)   SpO2 (!) 84%   BMI 24.20 kg/m  SpO2: SpO2: (!) 84 % O2 Device: O2 Device: Nasal Cannula O2 Flow Rate: O2 Flow Rate (L/min): 2 L/min  Intake/output summary:   Intake/Output Summary (Last 24 hours) at 10/01/16 1104 Last data filed at 10/01/16 0800  Gross per 24 hour  Intake                0 ml  Output                0 ml  Net                0 ml   LBM: Last BM Date: 09/28/16 Baseline Weight: Weight: 78.7 kg (173 lb 8 oz) Most recent weight: Weight: 78.7 kg (173 lb 8 oz)       Palliative Assessment/Data:    Flowsheet Rows     Most Recent Value  Intake Tab  Referral Department  Hospitalist  Unit at Time of Referral  Intermediate Care Unit    Palliative Care Primary Diagnosis  Cancer  Date Notified  09/29/16  Palliative Care Type  New Palliative care  Reason for referral  Clarify Goals of Care, Counsel Regarding Hospice  Date of Admission  10/16/2016  Date  first seen by Palliative Care  09/29/16  # of days Palliative referral response time  0 Day(s)  # of days IP prior to Palliative referral  4  Clinical Assessment  Palliative Performance Scale Score  10%  Pain Max last 24 hours  Not able to report  Pain Min Last 24 hours  Not able to report  Psychosocial & Spiritual Assessment  Palliative Care Outcomes  Patient/Family meeting held?  Yes  Who was at the meeting?  husband      Patient Active Problem List   Diagnosis Date Noted  . AKI (acute kidney injury) ([place]) 09/29/2016  . Hyperammonemia ([place]) 09/28/2016  . Metastatic colon cancer to liver ([place]) 09/27/2016  . Abdominal pain 09/26/2016  . Nausea, vomiting and diarrhea 09/26/2016  . Cough 09/26/2016  . Acute encephalopathy   . Healthcare-associated pneumonia 04/14/2016  . Pneumonia 04/13/2016  . Sepsis ([place]) 04/13/2016  . Hypercalcemia 04/13/2016  . Hypothyroidism 04/13/2016  . Severe protein-calorie malnutrition ([place]) 03/17/2016  . Syncope 03/16/2016  . Port catheter in place 03/14/2016  . Liver metastasis ([place]) 01/13/2016  . Lung metastasis ([place]) 01/13/2016  . Sepsis, unspecified organism ([place]) 01/09/2016  . Incisional hernia 03/08/2012  . Normocytic anemia 03/06/2012  . Colon cancer ([place]) 03/06/2012  . Diabetes mellitus type II, non insulin dependent ([place]) 03/06/2012  . HTN (hypertension) 03/06/2012    Palliative Care Assessment & Plan   Patient Profile: 76 y.o. female  with past medical history of Metastatic colon cancer (liver and lung), hypertension, hyperlipidemia, diabetes, hypothyroidism admitted on 09/18/2016 with nausea, vomiting, diarrhea, shortness of breath.  Her condition has continued to worsen. Palliative consulted for goals of care.    Recommendations/Plan:  Judith Stephens is actively dying.   She appears comfortable but with slightly increased respiratory rate.  Her RN was giving her a dose of morphine.  I added ativan, haldol, and robinul for use in case other symptoms arise.  She does not need these at this time.   Code Status:    Code Status Orders        Start     Ordered   09/26/16 0149  Do not attempt resuscitation (DNR)  Continuous    Question Answer Comment  In the event of cardiac or respiratory ARREST Do not call a "code blue"   In the event of cardiac or respiratory ARREST Do not perform Intubation, CPR, defibrillation or ACLS   In the event of cardiac or respiratory ARREST Use medication by any route, position, wound care, and other measures to relive pain and suffering. May use oxygen, suction and manual treatment of airway obstruction as needed for comfort.      09/26/16 0149    Code Status History    Date Active Date Inactive Code Status Order ID Comments User Context   04/14/2016  9:47 AM 04/14/2016  6:29 PM DNR [id number]  Murlean Iba, MD Inpatient   04/13/2016  4:18 AM 04/14/2016  9:47 AM Full Code [id number]  Norval Morton, MD Inpatient   03/16/2016  9:08 PM 03/19/2016  4:56 PM Full Code [id number]  Vianne Bulls, MD ED   01/09/2016 10:06 PM 01/13/2016  6:19 PM Full Code [id number]  Jani Gravel, MD ED   03/06/2012  6:12 AM 03/07/2012 12:25 AM Full Code [id number]  Etta Quill, DO ED       Prognosis:   Hours - Days.  Likely hours at this point.  Discharge Planning:  Anticipated Hospital Death  Care plan  was discussed with RN  Thank you for allowing the Palliative Medicine Team to assist in the care of this patient.   Time In: 1010 Time Out: 1030 Total Time 20 Prolonged Time Billed No      Greater than 50%  of this time was spent counseling and coordinating care related to the above assessment and plan.  Micheline Rough, MD  Please contact Palliative Medicine Team phone at  [phone] for questions and concerns.

## 2016-10-01 NOTE — Progress Notes (Signed)
Report called to nurse for pt transfer to 1502.Morphine  2mg m iv given ,resp 35. Patients husband called and notifed of transfer and pts poor condition.

## 2016-10-02 ENCOUNTER — Telehealth: Payer: Self-pay | Admitting: Oncology

## 2016-10-02 NOTE — Progress Notes (Signed)
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 The patient was also confused with abdominal pain for 3 days as well. Upon presentation, the patient was noted to have WBC 14.8 with lactic acid 4.0 and temperature 101.70F. Therewas concern for sepsis. The patient was fluid resuscitated and started on intravenous vancomycin and Zosyn.The patient's blood cultures were negative. CT of the abdomen and pelvis did not show any source of infection but revealed diffuse hepatic metastases and pulmonary metastasis. CT angiogram of the chest was negative for pulmonary embolus. Her blood and urine cultures remained negative. The patient's WBC continued to rise. She continued to have intermittent fever despite antibiotics. Subsequently, the patient developed renal failure that was multifactorial. In addition, the patient's LFTs and total bilirubin gradually trended upward with elevated ammonia suggesting hepatic failure. Multiple discussions were held with the husband regarding the patient's poor prognosis. Palliative medicine was consulted and met with the patient's husband. Initially, it was felt the patient may have benefited to transfer to residential hospice. However the patient's medical condition continued to deteriorate. The patient developed SVT. Subsequently, she became hypotensive.The patient received multiple fluid boluses with minimal to no improvement. I had another discussion with the patient's husband regarding the patient's poor prognosis. Initially, the patient's husband wanted to continue to support the patient's blood pressure without boluses. However, the patient remained  hypotensive despite numerous boluses. The patient's husband was updated on the patient's clinical condition. He subsequently felt that out of respect for the patient, that changing the patient's focus of care to the on full comfort was in the patient's best interest.  Assessment/Plan:  SIRS -Patient presented with elevated lactic acid, fever, sinus tachycardia -Continue empiric vancomycin and Zosyn pending culture data -Urinalysis negative for pyuria -Chest x-ray negative for consolidation -CT chest shows diffuse interstitial prominence with innumerable pulmonary metastasis. -CT abdomen and pelvis revealed cholelithiasis with normal CBDand innumerable hepatic metastasis. There were small volume ascites. There was diverticulosis with small bilateral inguinal hernias. -CTA chest--neg PE, RLL atelectasis, small R-effusion -Lactic acid remains elevated due to AKI -viral respiratory panel--neg -blood cultures remain neg -d/c abx--no clear source of infection  -unfortunately, the patient began to develop progressive renal failure and signs of hepatic failure despite full aggressive and optimal therapy -Multiple discussions were held patient's husband. Ultimately, it was felt in the patient's best interest to change the focus of care to the full comfort.  Acute metabolic encephalopathy -Secondary to hypercalcemia, renal failure, hyperammonemia -not much improvement in past 72hours -now pt's focus of care changed to focus on full comfort  Hypercalcemia of malignancy -continuecalcitonin [employer] x 4 doses total -pamidronate x 1 dose -am CMP -corrected calcium 11.68-->10.2 -continue IVF  AKI -multifactorial including contrast, antibiotics, volume depletion -continue IVF -continues to worsen -am CMP  Hyperammonemia -a sign of progressive hepatic failure -unclear clinical significance as pt's mental status improving before lactulose -recheck ammonia--52>>>39 with  lactulose  SVT -4/12 evening--developed SVT--started diltiazem drim -continue diltiazem drip through today -spontaneously converted to sinus 4/13 -Echo--EF 65-70%, grade 1 DD -TSH--1.094 -CT angio chest--neg PE  Vomiting and diarrhea -Resolved -related to [place] and hypercalcemia -Ultrasound abdomen--no ascites -2 view abd--unremarkable bowel gas pattern  Transaminasemia/hyperbilirubinemia -due to  hepatic mets and suspect impending hepatic failure -continues to worsensuggesting impending hepatic failure -CT abd without signs of cholecystitis  Leukocytosis -d/c all abx and observe clinically -likely due to acute medical condition -cultures remain neg; no clear source of infection -WBC continued to worsen despite antibiotics and negative cultures  Chronic diastolic CHF  -September 1610 echo EF 60-65%, grade 1 DD  Diabetes mellitus type 2 -Check hemoglobin A1c--5.1 -Previously on glipizide as an outpatient -ISS  Hypertension -controlled with diltiazem drip -Patient's focus of care changed to full comfort  Hypothyroidism -Continue Synthroid  Goals of Care -consulted palliative medicine -pt continues to develop complications despite optimal medical therapy -she became hypotensive.The patient received multiple fluid boluses with minimal to no improvement. I had another discussion with the patient's husband regarding the patient's poor prognosis. Initially, the patient's husband wanted to continue to support the patient's blood pressure with fluid boluses. However, the patient remained hypotensive despite numerous boluses. The patient's husband was updated on the patient's clinical condition. He subsequently felt that out of respect for the patient, that changing the patient's focus of care to the on full comfort was in the patient's best interest. -case discussed with Dr. Domingo Cocking -Multiple discussions were held patient's husband. Ultimately, it was felt in the  patient's best interest to change the focus of care to the full comfort.    Disposition Plan: Expecting in-hospital death Family Communication: Husband updatedat bedsid  Consultants: Palliative medicine; med onc--[place]  Code Status: FULL COMFORT  DVT Prophylaxis: FULL COMFORT   Procedures: As Listed in Progress Note Above  Antibiotics: None     Subjective: Patient is comfortable without distress. No reports of vomiting, respiratory distress, air hunger, vomiting, uncontrolled pain.  Objective: Vitals:   10/01/16 1500 10/01/16 1600 10/01/16 1623 10/02/16 0610  BP: (!) 77/34 (!) 75/32  (!) 101/52  Pulse: 85 85 86 91  Resp: (!) 34 (!) 29 (!) 32 (!) 29  Temp:    97.6 F (36.4 C)  TempSrc:    Oral  SpO2: (!) 85% (!) 85% (!) 84% 93%  Weight:      Height:       No intake or output data in the 24 hours ending 10/02/16 1858 Weight change:  Exam:   General:  Pt is resting comfortable; not in acute distress  HEENT: No icterus, , [place]/AT  Cardiovascular: RRR,   Respiratory: Diminished breath sounds bilateral. Bilateral rales.  Abdomen: Soft/+BS,non distended,  Extremities: 1 + LE edema, No lymphangitis,   Data Reviewed: I have personally reviewed following labs and imaging studies Basic Metabolic Panel:  Recent Labs Lab 09/26/16 0653 09/27/16 0423 09/28/16 0446 09/29/16 0511 09/30/16 0834  NA 142 141 140 140 141  K 3.9 3.8 3.7 3.6 5.1  CL 111 108 108 112* 113*  CO2 '24 24 23 '$ 20* 21*  GLUCOSE 98 80 180* 83 72  BUN '10 12 12 17 '$ 27*  CREATININE 0.57 0.63 0.59 1.29* 2.25*  CALCIUM 10.8* 10.8* 10.4* 9.0 8.9   Liver Function Tests:  Recent Labs Lab 09/17/2016 2147 09/28/16 0446 09/29/16 0511 09/30/16 0834  AST 113* 168* 415* 214*  ALT 20 26 61* 50  ALKPHOS 194* 160* 174* 126  BILITOT 1.1 1.2 1.6* 1.3*  PROT 7.5 6.8 7.0 6.4*  ALBUMIN 2.8* 2.4* 2.5* 2.1*   No results for input(s): LIPASE, AMYLASE in the last 168 hours.  Recent  Labs Lab 09/26/16 1000 09/28/16 1146  AMMONIA 52* 39*   Coagulation Profile:  Recent Labs Lab  10/16/2016 2147  INR 1.13   CBC:  Recent Labs Lab 09/21/2016 2147 09/26/16 0653 09/27/16 0423 09/28/16 0914 09/29/16 0511 09/30/16 0834  WBC 14.8* 13.5* 15.2* 18.3* 22.8* 28.8*  NEUTROABS 10.7*  --   --  14.7*  --  23.6*  HGB 9.9* 9.1* 8.8* 9.2* 8.4* 8.1*  HCT 31.0* 29.4* 27.5* 29.0* 26.9* 26.7*  MCV 91.2 89.6 91.4 90.6 89.1 95.0  PLT 308 264 280 302 278 320   Cardiac Enzymes:  Recent Labs Lab 09/28/16 0914  CKTOTAL 408*   BNP: Invalid input(s): POCBNP CBG:  Recent Labs Lab 09/28/16 0748 09/29/16 0817 09/30/16 0746 09/30/16 0943 09/30/16 1131  GLUCAP 152* 71 60* 120* 104*   HbA1C: No results for input(s): HGBA1C in the last 72 hours. Urine analysis:    Component Value Date/Time   COLORURINE AMBER (A) 09/20/2016 2145   APPEARANCEUR HAZY (A) 09/22/2016 2145   LABSPEC 1.019 10/01/2016 2145   PHURINE 6.0 10/07/2016 2145   GLUCOSEU NEGATIVE 10/05/2016 2145   HGBUR MODERATE (A) 10/06/2016 2145   BILIRUBINUR NEGATIVE 10/11/2016 2145   [place] NEGATIVE 09/24/2016 2145   PROTEINUR 30 (A) 09/30/2016 2145   NITRITE NEGATIVE 09/30/2016 2145   LEUKOCYTESUR NEGATIVE 09/21/2016 2145   Sepsis Labs: '@LABRCNTIP'$ (procalcitonin:4,lacticidven:4) ) Recent Results (from the past 240 hour(s))  Culture, blood (Routine x 2)     Status: None   Collection Time: 09/24/2016  9:47 PM  Result Value Ref Range Status   Specimen Description BLOOD LEFT FOREARM  Final   Special Requests   Final    BOTTLES DRAWN AEROBIC AND ANAEROBIC Blood Culture adequate volume   Culture   Final    NO GROWTH 5 DAYS Performed at [facility], [place] [address], [place], [place] [address]    Report Status 09/30/2016 FINAL  Final  Culture, blood (Routine x 2)     Status: None   Collection Time: 09/18/2016  9:47 PM  Result Value Ref Range Status   Specimen Description BLOOD RIGHT ANTECUBITAL  Final    Special Requests   Final    BOTTLES DRAWN AEROBIC AND ANAEROBIC Blood Culture adequate volume   Culture   Final    NO GROWTH 5 DAYS Performed at [facility], [place] [address], [place], [place] [address]    Report Status 09/30/2016 FINAL  Final  Culture, blood (single)     Status: None   Collection Time: 09/26/16  8:30 AM  Result Value Ref Range Status   Specimen Description BLOOD PICC LINE  Final   Special Requests BOTTLES DRAWN AEROBIC AND ANAEROBIC BCLV  Final   Culture   Final    NO GROWTH 5 DAYS Performed at [facility], [place] [address] [place], [place] [address]    Report Status 10/01/2016 FINAL  Final  Respiratory Panel by PCR     Status: None   Collection Time: 09/27/16  3:08 PM  Result Value Ref Range Status   Adenovirus NOT DETECTED NOT DETECTED Final   Coronavirus 229E NOT DETECTED NOT DETECTED Final   Coronavirus HKU1 NOT DETECTED NOT DETECTED Final   Coronavirus NL63 NOT DETECTED NOT DETECTED Final   Coronavirus OC43 NOT DETECTED NOT DETECTED Final   Metapneumovirus NOT DETECTED NOT DETECTED Final   Rhinovirus / Enterovirus NOT DETECTED NOT DETECTED Final   Influenza A NOT DETECTED NOT DETECTED Final   Influenza B NOT DETECTED NOT DETECTED Final   Parainfluenza Virus 1 NOT DETECTED NOT DETECTED Final   Parainfluenza  Virus 2 NOT DETECTED NOT DETECTED Final   Parainfluenza Virus 3 NOT DETECTED NOT DETECTED Final   Parainfluenza Virus 4 NOT DETECTED NOT DETECTED Final   Respiratory Syncytial Virus NOT DETECTED NOT DETECTED Final   Bordetella pertussis NOT DETECTED NOT DETECTED Final   Chlamydophila pneumoniae NOT DETECTED NOT DETECTED Final   Mycoplasma pneumoniae NOT DETECTED NOT DETECTED Final    Comment: Performed at [facility], [place] [address] [place], [place] [address]  Culture, blood (Routine X 2) w Reflex to ID Panel     Status: None (Preliminary result)   Collection Time: 09/28/16  7:09 PM  Result Value Ref Range Status   Specimen  Description BLOOD RIGHT ANTECUBITAL  Final   Special Requests   Final    BOTTLES DRAWN AEROBIC ONLY Blood Culture adequate volume   Culture   Final    NO GROWTH 4 DAYS Performed at [facility], [place] [address] [place], [place] [address]    Report Status PENDING  Incomplete  Culture, blood (Routine X 2) w Reflex to ID Panel     Status: None (Preliminary result)   Collection Time: 09/28/16  7:50 PM  Result Value Ref Range Status   Specimen Description BLOOD LEFT ANTECUBITAL  Final   Special Requests   Final    BOTTLES DRAWN AEROBIC ONLY Blood Culture adequate volume   Culture   Final    NO GROWTH 4 DAYS Performed at [facility], [place] [address] [place], [place] [address]    Report Status PENDING  Incomplete  MRSA PCR Screening     Status: None   Collection Time: 09/28/16  7:50 PM  Result Value Ref Range Status   MRSA by PCR NEGATIVE NEGATIVE Final    Comment:        The GeneXpert MRSA Assay (FDA approved for NASAL specimens only), is one component of a comprehensive MRSA colonization surveillance program. It is not intended to diagnose MRSA infection nor to guide or monitor treatment for MRSA infections.   Culture, Urine     Status: None   Collection Time: 09/29/16 12:01 PM  Result Value Ref Range Status   Specimen Description URINE, RANDOM  Final   Special Requests NONE  Final   Culture   Final    NO GROWTH Performed at [facility], [address] [place],  [address]    Report Status 09/30/2016 FINAL  Final     Scheduled Meds: . sodium chloride  1,000 mL Intravenous Once   Continuous Infusions:  Procedures/Studies: Dg Chest 2 View  Result Date: 09/24/2016 CLINICAL DATA:  Acute onset of fever and generalized chest pain. Abdominal distention and generalized weakness. Initial encounter. EXAM: CHEST  2 VIEW COMPARISON:  CT of the chest performed 09/04/2016 FINDINGS: The lungs are hypoexpanded. Vascular crowding and vascular congestion are  seen. Bilateral pulmonary nodules are again noted throughout the lungs, reflecting known metastatic disease. Underlying airspace opacification cannot be excluded. A small right pleural effusion is again noted. No pneumothorax is seen. The heart is normal in size; the mediastinal contour is within normal limits. A left-sided chest port is noted ending about the mid to distal SVC. No acute osseous abnormalities are seen. IMPRESSION: Lungs hypoexpanded. Vascular congestion noted. Bilateral pulmonary nodules again noted throughout the lungs, reflecting known metastatic disease. Underlying pneumonia cannot be excluded, given the patient's symptoms. Small right pleural effusion again noted. Electronically Signed   By: Francoise Schaumann.D.  On: 09/24/2016 22:08   Ct Chest W Contrast  Result Date: 09/26/2016 CLINICAL DATA:  Acute onset of generalized abdominal pain, distention and fever. Leukocytosis. Initial encounter. EXAM: CT CHEST, ABDOMEN, AND PELVIS WITH CONTRAST TECHNIQUE: Multidetector CT imaging of the chest, abdomen and pelvis was performed following the standard protocol during bolus administration of intravenous contrast. CONTRAST:  100 mL of Isovue 300 IV contrast COMPARISON:  CT of abdomen and pelvis performed 09/04/2016 FINDINGS: CT CHEST FINDINGS Cardiovascular: The heart is normal in size. The thoracic aorta is unremarkable. No calcific atherosclerotic disease is seen. The great vessels are grossly unremarkable. Mediastinum/Nodes: An enlarged 1.6 cm subcarinal node is again noted. No additional mediastinal lymphadenopathy is appreciated. No pericardial effusion is identified. The visualized portions of the thyroid gland are unremarkable. No axillary lymphadenopathy is appreciated. A left-sided chest port is noted. Lungs/Pleura: A small right pleural effusion is noted. Diffuse interstitial prominence may reflect mild interstitial edema. There is diffuse enlargement of innumerable pulmonary metastases,  measuring up to 2.0 cm in size. No pneumothorax is seen. Musculoskeletal: No acute osseous abnormalities are identified. The visualized musculature is unremarkable in appearance. CT ABDOMEN PELVIS FINDINGS Hepatobiliary: Innumerable masses are noted throughout the liver, reflecting metastatic disease. Stones are suggested at the gallbladder. The gallbladder is decompressed and not well assessed. The common bile duct remains normal in caliber. Small volume ascites is noted within the abdomen and pelvis, increased from the prior study. Pancreas: The pancreas is within normal limits. Spleen: The spleen is unremarkable in appearance. Adrenals/Urinary Tract: The adrenal glands are unremarkable in appearance. The kidneys are within normal limits Scattered bilateral renal cysts are noted, larger on the right. A nonobstructing 7 mm stone is noted at the interpole region of the right kidney. There is no evidence of hydronephrosis. No obstructing ureteral stones are identified. Nonspecific perinephric stranding is noted bilaterally. Stomach/Bowel: The stomach is unremarkable in appearance. The small bowel is within normal limits. The patient is status post appendectomy. The patient's ileocolic anastomosis is grossly unremarkable. Scattered diverticulosis is noted along the proximal sigmoid colon, without evidence of diverticulitis. Vascular/Lymphatic: The abdominal aorta is unremarkable in appearance. The inferior vena cava is grossly unremarkable. No retroperitoneal lymphadenopathy is seen. No pelvic sidewall lymphadenopathy is identified. Reproductive: The bladder is mildly distended and grossly unremarkable. The uterus is grossly unremarkable. No suspicious adnexal masses are seen. Other: Small bilateral inguinal hernias are noted, containing only fat. Musculoskeletal: No acute osseous abnormalities are identified. The visualized musculature is unremarkable in appearance. IMPRESSION: 1. Small right pleural effusion again  noted. Diffuse interstitial prominence may reflect mild interstitial edema. 2. Interval enlargement of innumerable pulmonary metastases, measuring up to 2.0 cm in size. 3. Innumerable masses again noted throughout the liver, reflecting metastatic disease. 4. Small volume ascites within the abdomen and pelvis is increased from the prior study. 5. Enlarged 1.6 cm subcarinal node noted. 6. Cholelithiasis suggested. 7. Scattered bilateral renal cysts. Nonobstructing 7 mm stone at the interpole region of the right kidney. 8. Scattered diverticulosis along the proximal sigmoid colon, without evidence of diverticulitis. 9. Small bilateral inguinal hernias, containing only fat. Electronically Signed   By: Garald Balding M.D.   On: 09/26/2016 00:14   Ct Chest W Contrast  Result Date: 09/04/2016 CLINICAL DATA:  Colon cancer diagnosed 5/6 years ago. Ascending colonic primary. Restaging. Diabetes. Hypertension. Anemia. EXAM: CT CHEST, ABDOMEN, AND PELVIS WITH CONTRAST TECHNIQUE: Multidetector CT imaging of the chest, abdomen and pelvis was performed following the standard protocol during bolus administration  of intravenous contrast. CONTRAST:  124m ISOVUE-300 IOPAMIDOL (ISOVUE-300) INJECTION 61% COMPARISON:  07/07/2016 FINDINGS: CT CHEST FINDINGS Cardiovascular: Tortuous thoracic aorta. Normal heart size, without pericardial effusion. No central pulmonary embolism, on this non-dedicated study. Mediastinum/Nodes: No supraclavicular adenopathy. Right paratracheal node is similar at 9 mm on image 18/series 2. No hilar adenopathy. Low periesophageal node is similar at 12 mm on image 37/series 2. Lungs/Pleura: Small right pleural effusion is minimally increased. Trace left pleural fluid is new. Mild motion degradation throughout. Extensive bilateral pulmonary metastasis. Index left upper lobe nodule measures 1.6 cm on image 56/series 6. Compare 1.4 cm on the prior. Index right lower lobe nodule measures 1.4 cm on image  56/series 6. Compare 1.2 cm on the prior. More cephalad left upper lobe index nodule measures 1.3 cm on image 41/series 6 versus 1.1 cm on the prior. No lobar consolidation. Musculoskeletal: No acute osseous abnormality. CT ABDOMEN PELVIS FINDINGS Hepatobiliary: Hepatomegaly at 22.0 cm craniocaudal. Extensive hepatic metastasis. Index high right hepatic lobe lesion measures 2.9 x 2.4 cm on image 40/series 2. Compare 2.7 x 2.1 cm on the prior exam (when remeasured). Calcified lesion just cephalad to the gallbladder is similar at 3.3 cm on image 58/series 2. Anterior left hepatic lobe 1.5 cm lesion image 54/series 2 is felt to be minimally enlarged from 1.4 cm previously. More central left lateral liver or lesion measures 1.7 cm on image 55/series 2 and is similar. Cholelithiasis without cholecystitis or biliary duct dilatation. Pancreas: Normal, without mass or ductal dilatation. Spleen: Subcapsular subcentimeter low-density splenic lesion is unchanged and of doubtful clinical significance. Adrenals/Urinary Tract: Normal adrenal glands. Right renal cysts. An interpolar right renal lesion measures 1.7 cm on image 78/series 2 and demonstrates complexity. This is unchanged in size to on the prior and similar in size back to 03/05/2012. No hydronephrosis. Normal urinary bladder. Stomach/Bowel: Mild motion degradation continuing into the upper abdomen. Portions of the stomach are collapsed. Colonic stool burden suggests constipation. Partial right hemicolectomy. Normal small bowel. Vascular/Lymphatic: Aortic and branch vessel atherosclerosis. Upper abdominal adenopathy, including a gastrohepatic ligament node of 1.6 cm on image 52/series 2. This is felt to be similar to the prior exam (when remeasured). No pelvic sidewall adenopathy. Reproductive: Normal uterus and adnexa. Other: Small volume abdominopelvic fluid is unchanged. No specific findings for omental or peritoneal metastasis. Small bilateral fat containing  inguinal hernias. Musculoskeletal: No acute osseous abnormality. 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 IMPRESSION: 1. Mild progression of pulmonary metastasis. 2. Similar to minimal progression of hepatic metastasis. 3. Similar lower thoracic and upper abdominal nodal metastasis. 4. Mild motion degradation. 5. Similar small volume abdominopelvic fluid. 6. Increase in small right pleural effusion with new trace left pleural fluid. 7. Cholelithiasis. 8.  Possible constipation. Electronically Signed   By: KAbigail MiyamotoM.D.   On: 09/04/2016 19:39   Ct Angio Chest Pe W Or Wo Contrast  Result Date: 09/28/2016 CLINICAL DATA:  Dyspnea EXAM: CT ANGIOGRAPHY CHEST WITH CONTRAST TECHNIQUE: Multidetector CT imaging of the chest was performed using the standard protocol during bolus administration of intravenous contrast. Multiplanar CT image reconstructions and MIPs were obtained to evaluate the vascular anatomy. CONTRAST:  100 mL Isovue 370 COMPARISON:  Chest CT 09/30/2016 FINDINGS: Cardiovascular: Contrast injection is sufficient to demonstrate satisfactory opacification of the pulmonary arteries to the segmental level. There is no pulmonary embolus. The main pulmonary artery is within normal limits for size. There is no CT evidence of acute right heart strain. The visualized aorta  is normal. There is a variant aortic arch branching pattern with an independent origin of the left vertebral artery. Heart size is normal, without pericardial effusion. Mediastinum/Nodes: No mediastinal, hilar or axillary lymphadenopathy. The visualized thyroid and thoracic esophageal course are unremarkable. Lungs/Pleura: Numerous metastatic lesions are again seen scattered throughout both lungs, unchanged from the recent scan of 10/16/2016. There is right lower lobe atelectasis with a small pleural effusion, unchanged. Upper Abdomen: Contrast bolus timing is not optimized for evaluation of the abdominal organs.  Numerous lesions throughout the liver, unchanged when allowing for differences in technique. Musculoskeletal: No chest wall abnormality. No acute or significant osseous findings. Review of the MIP images confirms the above findings. IMPRESSION: 1. No pulmonary embolus. 2. Unchanged appearance of innumerable pulmonary and hepatic metastases compared to 10/01/2016. 3. Small right pleural effusion and associated atelectasis, unchanged. Electronically Signed   By: Deatra Robinson M.D.   On: 09/28/2016 23:20   Ct Abdomen Pelvis W Contrast  Result Date: 09/26/2016 CLINICAL DATA:  Acute onset of generalized abdominal pain, distention and fever. Leukocytosis. Initial encounter. EXAM: CT CHEST, ABDOMEN, AND PELVIS WITH CONTRAST TECHNIQUE: Multidetector CT imaging of the chest, abdomen and pelvis was performed following the standard protocol during bolus administration of intravenous contrast. CONTRAST:  100 mL of Isovue 300 IV contrast COMPARISON:  CT of abdomen and pelvis performed 09/04/2016 FINDINGS: CT CHEST FINDINGS Cardiovascular: The heart is normal in size. The thoracic aorta is unremarkable. No calcific atherosclerotic disease is seen. The great vessels are grossly unremarkable. Mediastinum/Nodes: An enlarged 1.6 cm subcarinal node is again noted. No additional mediastinal lymphadenopathy is appreciated. No pericardial effusion is identified. The visualized portions of the thyroid gland are unremarkable. No axillary lymphadenopathy is appreciated. A left-sided chest port is noted. Lungs/Pleura: A small right pleural effusion is noted. Diffuse interstitial prominence may reflect mild interstitial edema. There is diffuse enlargement of innumerable pulmonary metastases, measuring up to 2.0 cm in size. No pneumothorax is seen. Musculoskeletal: No acute osseous abnormalities are identified. The visualized musculature is unremarkable in appearance. CT ABDOMEN PELVIS FINDINGS Hepatobiliary: Innumerable masses are noted  throughout the liver, reflecting metastatic disease. Stones are suggested at the gallbladder. The gallbladder is decompressed and not well assessed. The common bile duct remains normal in caliber. Small volume ascites is noted within the abdomen and pelvis, increased from the prior study. Pancreas: The pancreas is within normal limits. Spleen: The spleen is unremarkable in appearance. Adrenals/Urinary Tract: The adrenal glands are unremarkable in appearance. The kidneys are within normal limits Scattered bilateral renal cysts are noted, larger on the right. A nonobstructing 7 mm stone is noted at the interpole region of the right kidney. There is no evidence of hydronephrosis. No obstructing ureteral stones are identified. Nonspecific perinephric stranding is noted bilaterally. Stomach/Bowel: The stomach is unremarkable in appearance. The small bowel is within normal limits. The patient is status post appendectomy. The patient's ileocolic anastomosis is grossly unremarkable. Scattered diverticulosis is noted along the proximal sigmoid colon, without evidence of diverticulitis. Vascular/Lymphatic: The abdominal aorta is unremarkable in appearance. The inferior vena cava is grossly unremarkable. No retroperitoneal lymphadenopathy is seen. No pelvic sidewall lymphadenopathy is identified. Reproductive: The bladder is mildly distended and grossly unremarkable. The uterus is grossly unremarkable. No suspicious adnexal masses are seen. Other: Small bilateral inguinal hernias are noted, containing only fat. Musculoskeletal: No acute osseous abnormalities are identified. The visualized musculature is unremarkable in appearance. IMPRESSION: 1. Small right pleural effusion again noted. Diffuse interstitial prominence may reflect mild  interstitial edema. 2. Interval enlargement of innumerable pulmonary metastases, measuring up to 2.0 cm in size. 3. Innumerable masses again noted throughout the liver, reflecting metastatic  disease. 4. Small volume ascites within the abdomen and pelvis is increased from the prior study. 5. Enlarged 1.6 cm subcarinal node noted. 6. Cholelithiasis suggested. 7. Scattered bilateral renal cysts. Nonobstructing 7 mm stone at the interpole region of the right kidney. 8. Scattered diverticulosis along the proximal sigmoid colon, without evidence of diverticulitis. 9. Small bilateral inguinal hernias, containing only fat. Electronically Signed   By: Garald Balding M.D.   On: 09/26/2016 00:14   Ct Abdomen Pelvis W Contrast  Result Date: 09/04/2016 CLINICAL DATA:  Colon cancer diagnosed 5/6 years ago. Ascending colonic primary. Restaging. Diabetes. Hypertension. Anemia. EXAM: CT CHEST, ABDOMEN, AND PELVIS WITH CONTRAST TECHNIQUE: Multidetector CT imaging of the chest, abdomen and pelvis was performed following the standard protocol during bolus administration of intravenous contrast. CONTRAST:  152m ISOVUE-300 IOPAMIDOL (ISOVUE-300) INJECTION 61% COMPARISON:  07/07/2016 FINDINGS: CT CHEST FINDINGS Cardiovascular: Tortuous thoracic aorta. Normal heart size, without pericardial effusion. No central pulmonary embolism, on this non-dedicated study. Mediastinum/Nodes: No supraclavicular adenopathy. Right paratracheal node is similar at 9 mm on image 18/series 2. No hilar adenopathy. Low periesophageal node is similar at 12 mm on image 37/series 2. Lungs/Pleura: Small right pleural effusion is minimally increased. Trace left pleural fluid is new. Mild motion degradation throughout. Extensive bilateral pulmonary metastasis. Index left upper lobe nodule measures 1.6 cm on image 56/series 6. Compare 1.4 cm on the prior. Index right lower lobe nodule measures 1.4 cm on image 56/series 6. Compare 1.2 cm on the prior. More cephalad left upper lobe index nodule measures 1.3 cm on image 41/series 6 versus 1.1 cm on the prior. No lobar consolidation. Musculoskeletal: No acute osseous abnormality. CT ABDOMEN PELVIS  FINDINGS Hepatobiliary: Hepatomegaly at 22.0 cm craniocaudal. Extensive hepatic metastasis. Index high right hepatic lobe lesion measures 2.9 x 2.4 cm on image 40/series 2. Compare 2.7 x 2.1 cm on the prior exam (when remeasured). Calcified lesion just cephalad to the gallbladder is similar at 3.3 cm on image 58/series 2. Anterior left hepatic lobe 1.5 cm lesion image 54/series 2 is felt to be minimally enlarged from 1.4 cm previously. More central left lateral liver or lesion measures 1.7 cm on image 55/series 2 and is similar. Cholelithiasis without cholecystitis or biliary duct dilatation. Pancreas: Normal, without mass or ductal dilatation. Spleen: Subcapsular subcentimeter low-density splenic lesion is unchanged and of doubtful clinical significance. Adrenals/Urinary Tract: Normal adrenal glands. Right renal cysts. An interpolar right renal lesion measures 1.7 cm on image 78/series 2 and demonstrates complexity. This is unchanged in size to on the prior and similar in size back to 03/05/2012. No hydronephrosis. Normal urinary bladder. Stomach/Bowel: Mild motion degradation continuing into the upper abdomen. Portions of the stomach are collapsed. Colonic stool burden suggests constipation. Partial right hemicolectomy. Normal small bowel. Vascular/Lymphatic: Aortic and branch vessel atherosclerosis. Upper abdominal adenopathy, including a gastrohepatic ligament node of 1.6 cm on image 52/series 2. This is felt to be similar to the prior exam (when remeasured). No pelvic sidewall adenopathy. Reproductive: Normal uterus and adnexa. Other: Small volume abdominopelvic fluid is unchanged. No specific findings for omental or peritoneal metastasis. Small bilateral fat containing inguinal hernias. Musculoskeletal: No acute osseous abnormality. 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 IMPRESSION: 1. Mild progression of pulmonary metastasis. 2. Similar to minimal progression of hepatic metastasis. 3.  Similar lower thoracic and upper abdominal  nodal metastasis. 4. Mild motion degradation. 5. Similar small volume abdominopelvic fluid. 6. Increase in small right pleural effusion with new trace left pleural fluid. 7. Cholelithiasis. 8.  Possible constipation. Electronically Signed   By: Abigail Miyamoto M.D.   On: 09/04/2016 19:39   US Abdomen Limited  Result Date: 09/26/2016 CLINICAL DATA:  History of metastatic colon cancer, request to evaluate for ascites. EXAM: LIMITED ABDOMEN ULTRASOUND FOR ASCITES TECHNIQUE: Limited ultrasound survey for ascites was performed in all four abdominal quadrants. COMPARISON:  None. FINDINGS: No ascitic fluid throughout all 4 quadrants. IMPRESSION: No ascites.  No paracentesis performed. Read by: Saverio Danker, PA-C Electronically Signed   By: Corrie Mckusick D.O.   On: 09/26/2016 11:26   Dg Chest Port 1 View  Result Date: 09/29/2016 CLINICAL DATA:  Dyspnea.  Metastatic colon cancer. EXAM: PORTABLE CHEST 1 VIEW COMPARISON:  09/17/2016 chest radiograph. FINDINGS: Left subclavian MediPort terminates at cavoatrial junction. Low lung volumes. Stable cardiomediastinal silhouette with normal heart size. No pneumothorax. No pleural effusion. Gross is stable pulmonary nodules scattered throughout both lungs. Stable bibasilar hazy lung opacities favoring atelectasis. No acute consolidative airspace disease. IMPRESSION: Stable chest radiograph with low lung volumes, scattered pulmonary metastases throughout both lungs and bibasilar atelectasis. Electronically Signed   By: Ilona Sorrel M.D.   On: 09/29/2016 18:58   Dg Abd 2 Views  Result Date: 09/29/2016 CLINICAL DATA:  Acute onset of generalized abdominal pain and distention. Initial encounter. EXAM: ABDOMEN - 2 VIEW COMPARISON:  CT of the abdomen and pelvis performed 10/12/2016 FINDINGS: The visualized bowel gas pattern is unremarkable. Scattered air and stool filled loops of colon are seen; no abnormal dilatation of small bowel loops  is seen to suggest small bowel obstruction. No free intra-abdominal air is identified on the provided decubitus view; decreased density along the right abdominal wall likely reflects underlying soft tissue edema. The stomach is partially filled with air. The visualized osseous structures are within normal limits; the sacroiliac joints are unremarkable in appearance. The visualized lung bases are essentially clear. IMPRESSION: Unremarkable bowel gas pattern; no free intra-abdominal air seen. Small to moderate amount of stool noted in the colon. Electronically Signed   By: Garald Balding M.D.   On: 09/29/2016 04:03    Karema Tocci, DO  [facility] Pager [phone]  If 7PM-7AM, please contact night-coverage [email or web address] Password [email or web address] 10/02/2016, 6:58 PM   LOS: 6 days

## 2016-10-02 NOTE — Care Management Important Message (Signed)
Important Message  Patient Details  Name: Judith Stephens MRN: [id number] Date of Birth: Dec 29, 1940   Medicare Important Message Given:  Yes    Kerin Salen 10/02/2016, 12:30 [place] Message  Patient Details  Name: Judith Stephens MRN: [id number] Date of Birth: [birth year]   Medicare Important Message Given:  Yes    Kerin Salen 10/02/2016, 12:30 PM

## 2016-10-02 NOTE — Telephone Encounter (Signed)
FAXED OFFICE NOTES TO [facility]  [phone]

## 2016-10-02 NOTE — Progress Notes (Signed)
Respirations noted to be more shallow. Husband called to update.

## 2016-10-02 NOTE — Progress Notes (Signed)
Daily Progress Note   Patient Name: Judith Stephens       Date: 10/02/2016 DOB: 08/29/1940  Age: 76 y.o. MRN#: [id number] Attending Physician: Orson Eva, MD Primary Care Physician: Donnie Coffin, MD Admit Date: 10/11/2016  Reason for Consultation/Follow-up: Establishing goals of care  Subjective: I saw and examined Judith Stephens today.  She is actively dying. No family in the room.  See below.  Length of Stay: 6  Current Medications: Scheduled Meds:  . sodium chloride  1,000 mL Intravenous Once   Continuous Infusions:  PRN Meds: acetaminophen **OR** acetaminophen, glycopyrrolate, haloperidol lactate, levalbuterol, LORazepam, morphine injection, sodium chloride flush  Physical Exam   General: Somnolent, in no acute distress.  HEENT: No bruits, no goiter, no JVD Heart: Regular rate. Lungs: Diminished air movement, shallow breaths Abdomen: Soft, nontender, nondistended, positive bowel sounds.  Ext: + edema  Vital Signs: BP (!) 101/52 (BP Location: Right Arm)   Pulse 91   Temp 97.6 F (36.4 C) (Oral)   Resp (!) 29   Ht 5\' 10"  (1.778 m)   Wt 78.7 kg (173 lb 8 oz)   SpO2 93%   BMI 24.20 kg/m  SpO2: SpO2: 93 % O2 Device: O2 Device: Nasal Cannula O2 Flow Rate: O2 Flow Rate (L/min): 2 L/min  Intake/output summary:   Intake/Output Summary (Last 24 hours) at 10/02/16 1145 Last data filed at 10/01/16 1808  Gross per 24 hour  Intake                0 ml  Output                0 ml  Net                0 ml   LBM: Last BM Date: 09/26/16 Baseline Weight: Weight: 78.7 kg (173 lb 8 oz) Most recent weight: Weight: 78.7 kg (173 lb 8 oz)       Palliative Assessment/Data:    Flowsheet Rows     Most Recent Value  Intake Tab  Referral Department  Hospitalist  Unit at Time of  Referral  Intermediate Care Unit  Palliative Care Primary Diagnosis  Cancer  Date Notified  09/29/16  Palliative Care Type  New Palliative care  Reason for referral  Clarify Goals of Care, Counsel Regarding Hospice  Date  of Admission  10/10/2016  Date first seen by Palliative Care  09/29/16  # of days Palliative referral response time  0 Day(s)  # of days IP prior to Palliative referral  4  Clinical Assessment  Palliative Performance Scale Score  10%  Pain Max last 24 hours  Not able to report  Pain Min Last 24 hours  Not able to report  Psychosocial & Spiritual Assessment  Palliative Care Outcomes  Patient/Family meeting held?  Yes  Who was at the meeting?  husband      Patient Active Problem List   Diagnosis Date Noted  . AKI (acute kidney injury) ([place]) 09/29/2016  . Hyperammonemia ([place]) 09/28/2016  . Metastatic colon cancer to liver ([place]) 09/27/2016  . Abdominal pain 09/26/2016  . Nausea, vomiting and diarrhea 09/26/2016  . Cough 09/26/2016  . Acute encephalopathy   . Healthcare-associated pneumonia 04/14/2016  . Pneumonia 04/13/2016  . Sepsis ([place]) 04/13/2016  . Hypercalcemia 04/13/2016  . Hypothyroidism 04/13/2016  . Severe protein-calorie malnutrition ([place]) 03/17/2016  . Syncope 03/16/2016  . Port catheter in place 03/14/2016  . Liver metastasis ([place]) 01/13/2016  . Lung metastasis ([place]) 01/13/2016  . Sepsis, unspecified organism ([place]) 01/09/2016  . Incisional hernia 03/08/2012  . Normocytic anemia 03/06/2012  . Colon cancer ([place]) 03/06/2012  . Diabetes mellitus type II, non insulin dependent ([place]) 03/06/2012  . HTN (hypertension) 03/06/2012    Palliative Care Assessment & Plan   Patient Profile: 76 y.o. female  with past medical history of Metastatic colon cancer (liver and lung), hypertension, hyperlipidemia, diabetes, hypothyroidism admitted on 10/09/2016 with nausea, vomiting, diarrhea, shortness of breath.  She is now full comfort  care.  Recommendations/Plan:  Judith Stephens is actively dying.   She appears comfortable on examination this AM.  She has had 4 doses of morphine in the last 24 hours.  Continue morphine, ativan, haldol, and robinul as symptoms arise.    No family at bedside this AM.  Code Status:    Code Status Orders        Start     Ordered   09/26/16 0149  Do not attempt resuscitation (DNR)  Continuous    Question Answer Comment  In the event of cardiac or respiratory ARREST Do not call a "code blue"   In the event of cardiac or respiratory ARREST Do not perform Intubation, CPR, defibrillation or ACLS   In the event of cardiac or respiratory ARREST Use medication by any route, position, wound care, and other measures to relive pain and suffering. May use oxygen, suction and manual treatment of airway obstruction as needed for comfort.      09/26/16 0149    Code Status History    Date Active Date Inactive Code Status Order ID Comments User Context   04/14/2016  9:47 AM 04/14/2016  6:29 PM DNR [id number]  Murlean Iba, MD Inpatient   04/13/2016  4:18 AM 04/14/2016  9:47 AM Full Code [id number]  Norval Morton, MD Inpatient   03/16/2016  9:08 PM 03/19/2016  4:56 PM Full Code [id number]  Vianne Bulls, MD ED   01/09/2016 10:06 PM 01/13/2016  6:19 PM Full Code [id number]  Jani Gravel, MD ED   03/06/2012  6:12 AM 03/07/2012 12:25 AM Full Code [id number]  Etta Quill, DO ED       Prognosis:   Hours - Days.  Likely hours at this point.  Discharge Planning:  Anticipated Hospital Death  Care plan was discussed with RN, Dr.  Tat  Thank you for allowing the Palliative Medicine Team to assist in the care of this patient.   Time In: 0935 Time Out: 0955 Total Time 20 Prolonged Time Billed No      Greater than 50%  of this time was spent counseling and coordinating care related to the above assessment and plan.  Micheline Rough, MD  Please contact Palliative Medicine Team phone at [phone] for  questions and concerns.

## 2016-10-02 NOTE — Progress Notes (Signed)
Husband at bedside. Yellow hoop earrings were removed from patient and given to husband.

## 2016-10-02 NOTE — Progress Notes (Signed)
SLP Cancellation Note  Patient Details Name: Judith Stephens MRN: [id number] DOB: [birth year]   Cancelled treatment:       Reason Eval/Treat Not Completed: Other (comment) (pt now full comfort care per chart review)   Claudie Fisherman, [place] [facility] SLP [phone]

## 2016-10-03 LAB — CULTURE, BLOOD (ROUTINE X 2)
CULTURE: NO GROWTH
CULTURE: NO GROWTH
Special Requests: ADEQUATE
Special Requests: ADEQUATE

## 2016-10-14 ENCOUNTER — Other Ambulatory Visit: Payer: Self-pay | Admitting: Nurse Practitioner

## 2016-10-17 NOTE — Progress Notes (Signed)
Pt expired 10-07-2016 at [facility], pronounced by 2 RNs. Pt was comfort care, a DNR, and death was expected. Death certificate completed and given to RN. KJKG, NP [facility]

## 2016-10-17 DEATH — deceased

## 2016-10-18 NOTE — Telephone Encounter (Signed)
"  This is Ail[facility] R[facility] calling for a refill on this patient."  Informed [facility] patient has expired.  Thanked me for sharing this information.

## 2016-10-28 ENCOUNTER — Other Ambulatory Visit: Payer: Self-pay | Admitting: Nurse Practitioner

## 2016-12-13 ENCOUNTER — Ambulatory Visit: Payer: Medicare Other | Admitting: Podiatry

## 2018-08-10 IMAGING — DX DG CHEST 1V PORT
1 series · 1 of 1 positions shown · non-contrast
Comparison: Chest radiograph 01/09/2016 and CT 01/12/2016

CLINICAL DATA: Status post Port-A-Cath placement.

EXAM:
PORTABLE CHEST 1 VIEW

[chest ap]
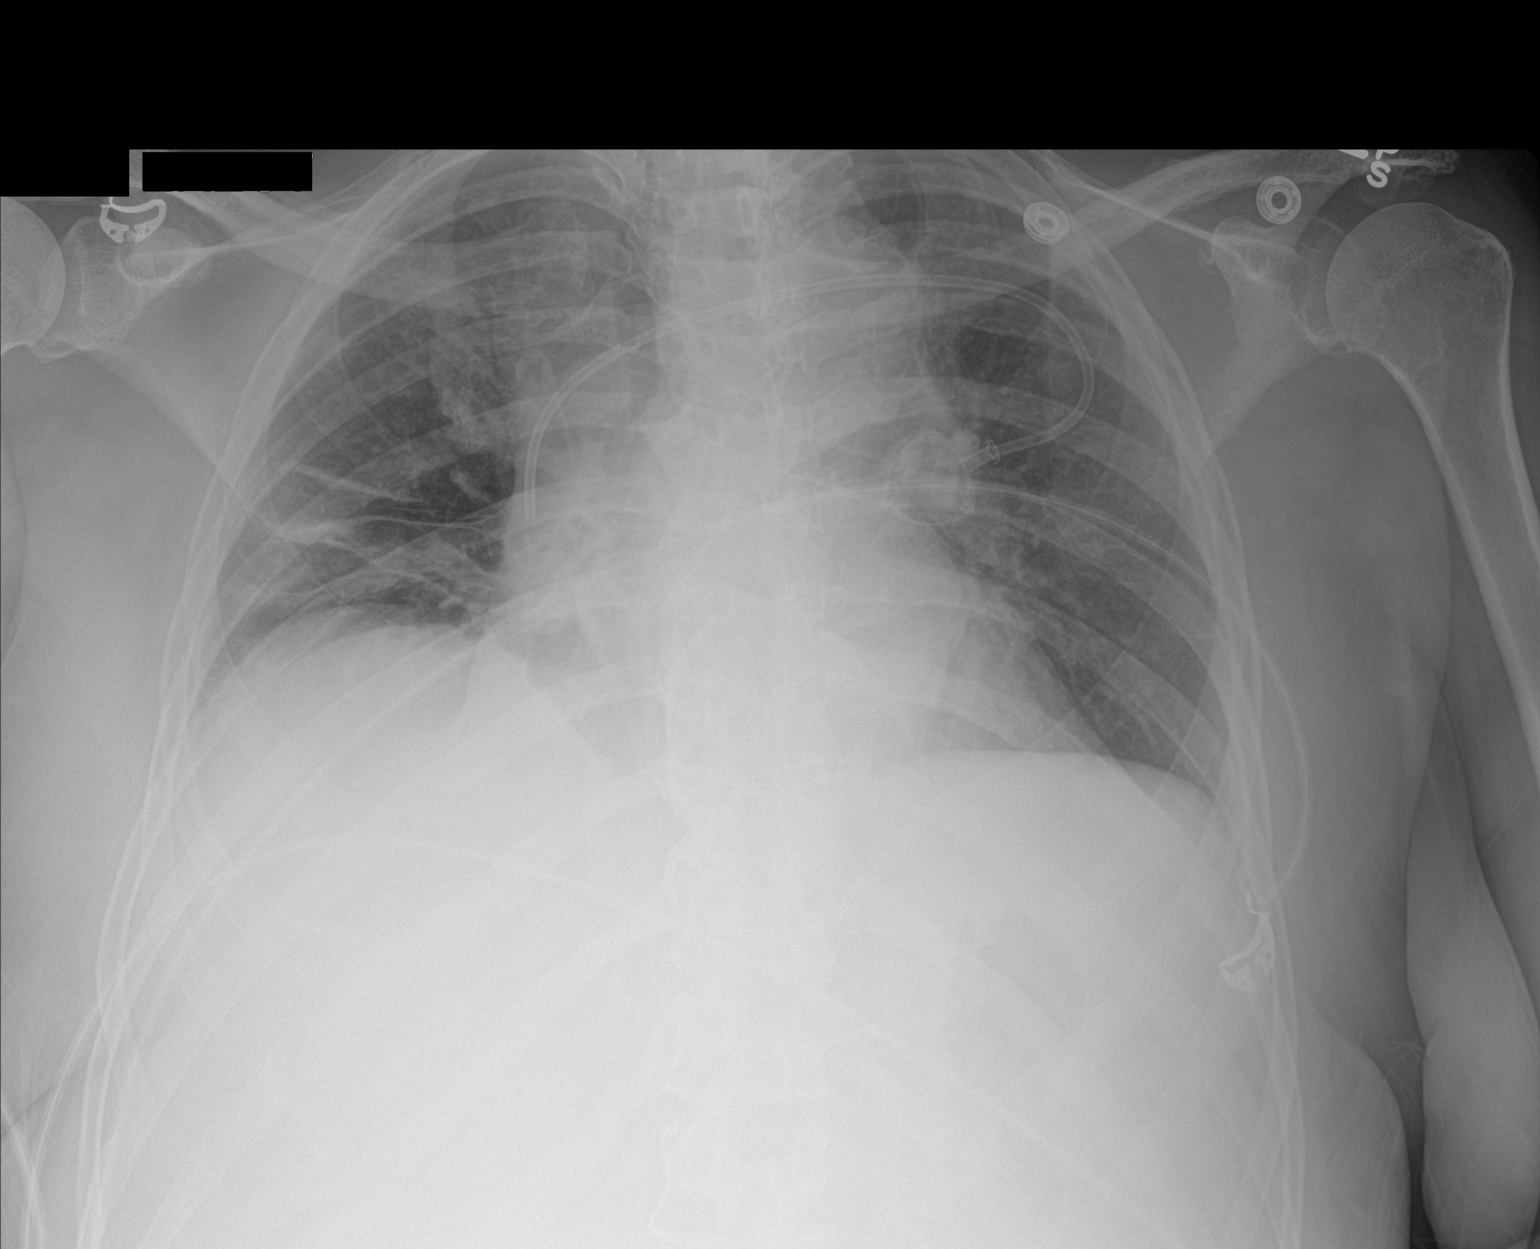

[1 of 1 positions shown; findings below may reference images not displayed]

FINDINGS: A left subclavian Port-A-Cath has been placed and terminates over
the lower SVC. The patient is mildly rotated to the right which
mildly limits assessment of the mediastinal contours. The
cardiomediastinal silhouette is grossly unchanged. Lung volumes are
diminished with curvilinear opacities in the right lung base
consistent with atelectasis. No pleural effusion or pneumothorax is
identified. No acute osseous abnormality is seen.
IMPRESSION: 1. Interval Port-A-Cath placement, terminating over the lower SVC.
No pneumothorax.
2. Low lung volumes with right basilar atelectasis.

## 2018-10-22 IMAGING — CT CT ABD-PELV W/ CM
2 of 5 series · 12 of 36 positions shown, 15 images · IV contrast (APPLIED)
Comparison: 01/12/2016

CLINICAL DATA: Known colonic carcinoma with liver metastatic
disease

EXAM:
CT CHEST, ABDOMEN, AND PELVIS WITH CONTRAST
TECHNIQUE: Multidetector CT imaging of the chest, abdomen and pelvis was
performed following the standard protocol during bolus
administration of intravenous contrast.
CONTRAST:  100mL [id number] IOPAMIDOL (FQBK7Y-U66) INJECTION 61%

[Series 2: cap with · axial · 0.87mm/px · z∈[-602,-62]mm · 9 of 134 slices shown, 12 images]
[im 13/134  mediastinal]
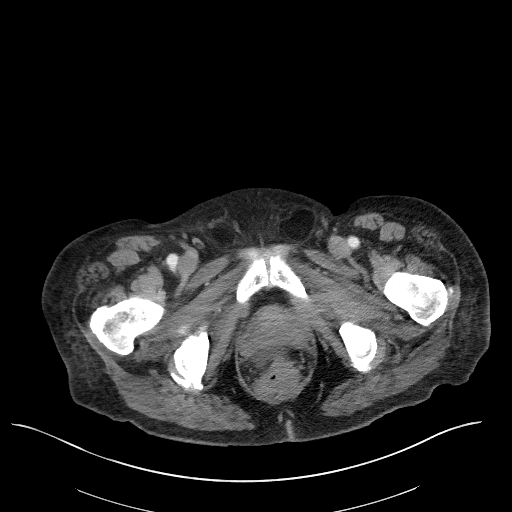
[im 13/134  lung]
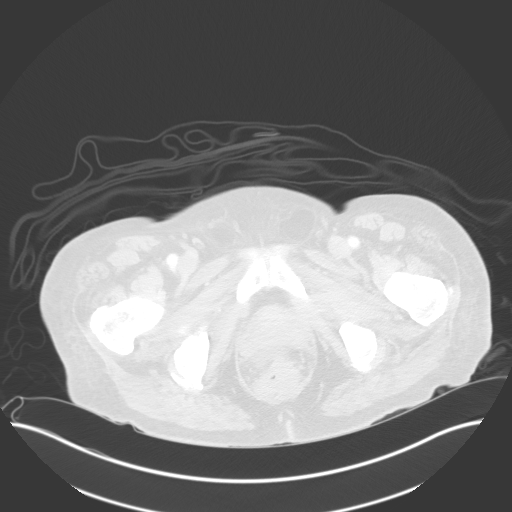
[im 25/134  lung]
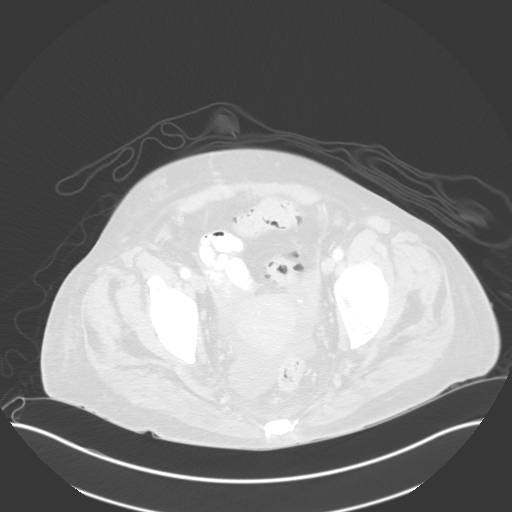
[im 37/134  lung]
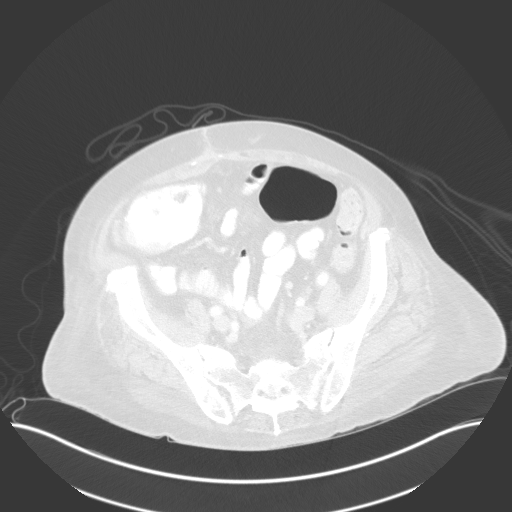
[im 49/134  lung]
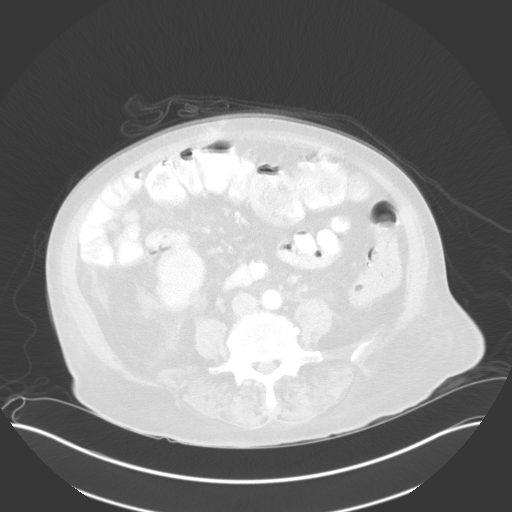
[im 73/134  mediastinal]
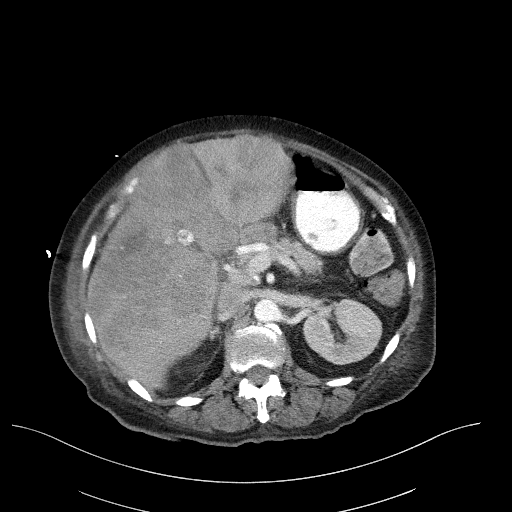
[im 73/134  lung]
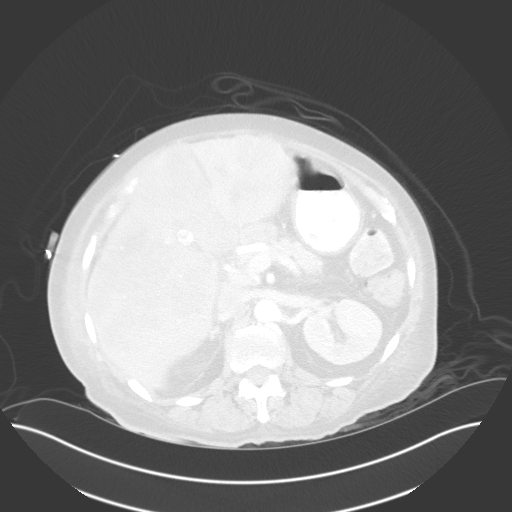
[im 85/134  lung]
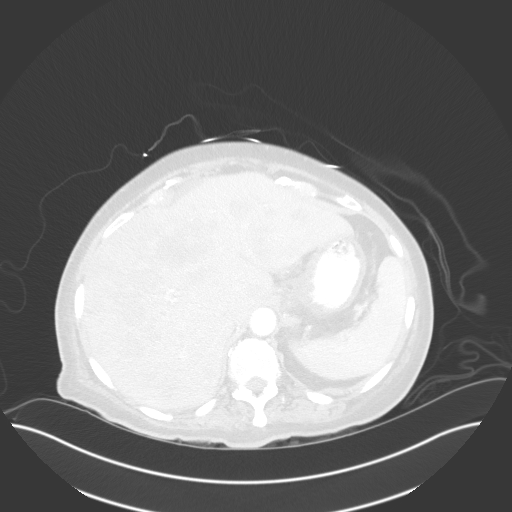
[im 97/134  lung]
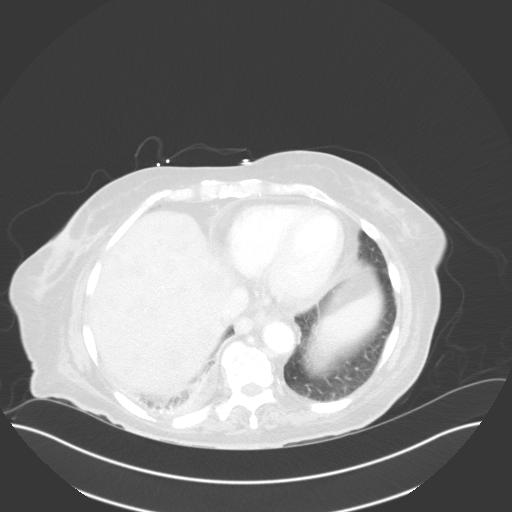
[im 109/134  lung]
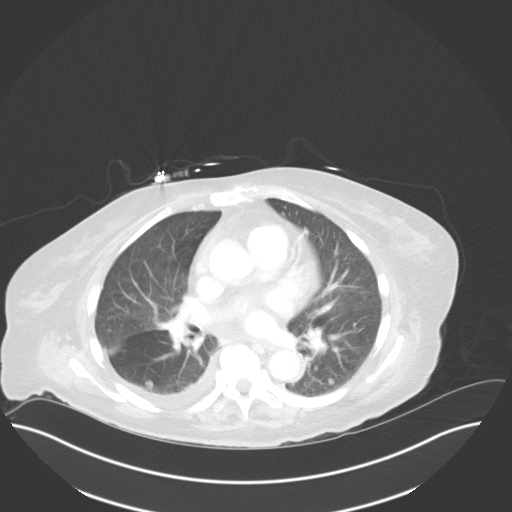
[im 121/134  mediastinal]
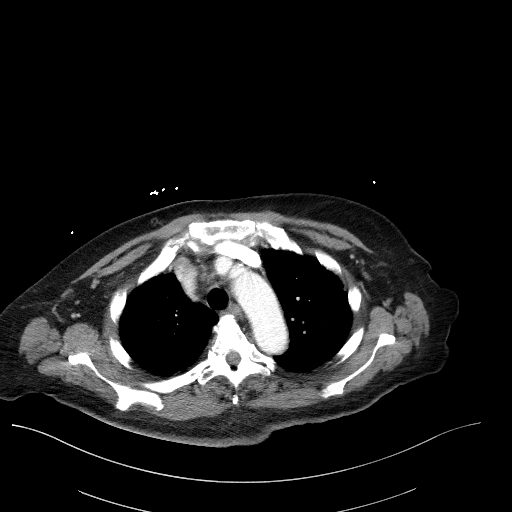
[im 121/134  lung]
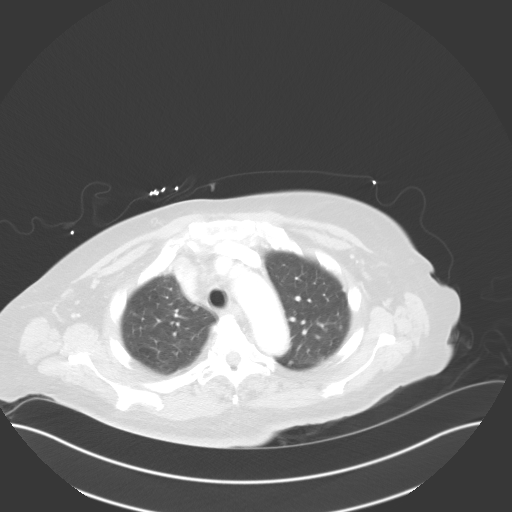

[Series 5: coronals · coronal · 0.77mm/px · 3 of 168 slices shown]
[im 34/168  lung]
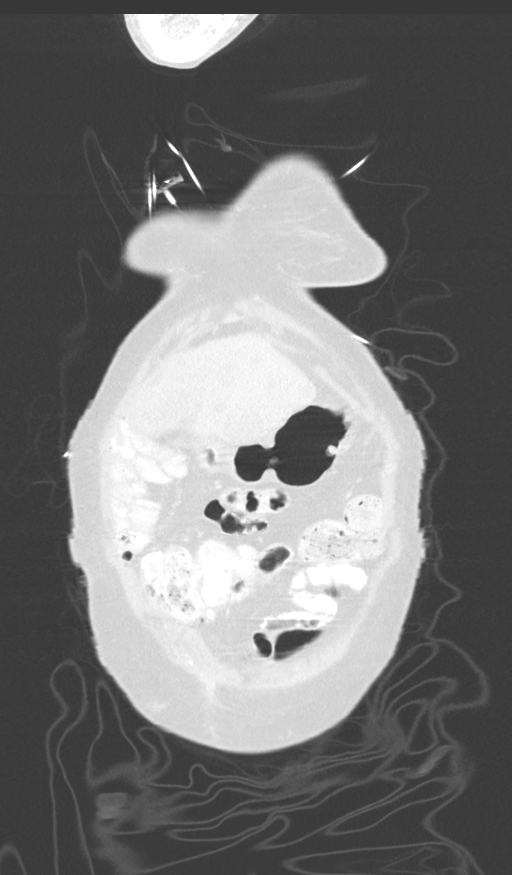
[im 67/168  lung]
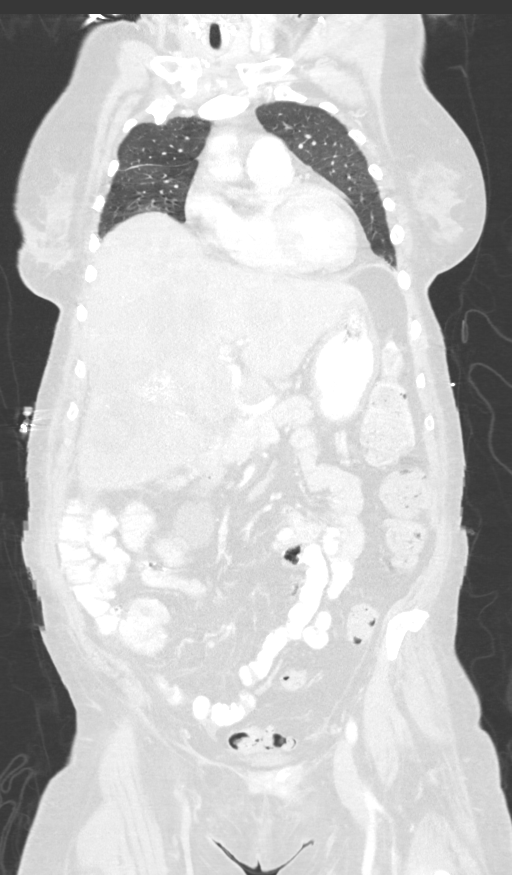
[im 101/168  lung]
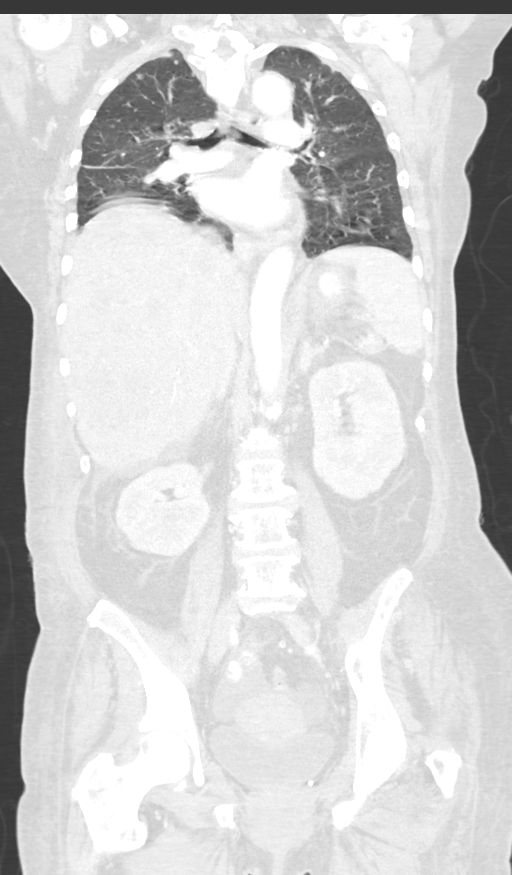

[12 of 36 positions shown; findings below may reference images not displayed]

FINDINGS: CT CHEST FINDINGS

Cardiovascular: The aorta shows no significant aneurysmal dilatation
or dissection. The pulmonary artery is somewhat limited by motion
artifact although no large central embolus is noted. Mild coronary
calcifications are seen. Cardiac structures are within normal
limits.

Mediastinum/Nodes: Thoracic inlet is within normal limits. Stable
precarinal lymph node is noted when compare with the prior exam.
Subcarinal lymph node has improved now measuring less than 1 cm in
short axis. No hilar adenopathy is seen. No axillary adenopathy is
noted.

Lungs/Pleura: The lungs are well aerated bilaterally. A small
right-sided pleural effusion is seen. Scattered nodules are noted
throughout both lungs. The largest of these on the left lies in the
left upper lobe best seen on image number 56 of series 4 measuring
approximately 1.5 cm, increased in size 1.2 cm on the prior exam.
Largest lesion on the right lies in the medial aspect of the upper
lobe measuring 16 mm in dimension increased from 13 mm on the prior
exam.

Musculoskeletal: No acute bony abnormality is noted. Degenerative
change of the thoracic spine is seen.

CT ABDOMEN PELVIS FINDINGS

Hepatobiliary: The liver demonstrates innumerable hypodense lesions
with peripheral enhancement consistent with metastatic disease.
These have increased in both size and number when compared with the
prior exam. The large central conglomerate mass measures at least
9.9 cm. Central calcifications are noted as well as changes of
cholelithiasis.

Pancreas: Unremarkable. No pancreatic ductal dilatation or
surrounding inflammatory changes.

Spleen: Normal in size without focal abnormality.

Adrenals/Urinary Tract: The adrenal glands are within normal limits.
The kidneys demonstrate cystic change on the right as well as some
nonobstructing calculi. These changes are stable from the prior
exam. The left kidney is within normal limits.

Stomach/Bowel: Postsurgical changes are noted consistent with the
given clinical history of prior right colectomy. No obstructive
changes are seen.

Vascular/Lymphatic: Scattered small retroperitoneal lymph nodes are
again seen. Peripancreatic and porta hepatis lymph nodes are again
noted and relatively stable in appearance. Soft tissue density is
noted in the gastrohepatic ligament measuring 19 mm also consistent
with lymphadenopathy.

Reproductive: Uterus and bilateral adnexa are unremarkable. Cystic
changes again noted in the left ovary.

Other: Mild ascites is noted within the pelvis as well as
surrounding the liver.

Musculoskeletal: No acute bony abnormality is seen.
IMPRESSION: CT of the chest: Changes consistent with increase in size of
pulmonary parenchymal metastatic disease. Relatively stable to
slightly reduced mediastinal adenopathy is noted.

Small right pleural effusion is noted.

CT of the abdomen: Increase in both size and number of multiple
hepatic metastatic lesions.

Cholelithiasis stable.

Stable lymphadenopathy with the exception of increasing
gastrohepatic ligament nodal mass.

Changes consistent with her right hemicolectomy.

Slight increase in ascites.
